# Patient Record
Sex: Female | Born: 1940
Health system: Southern US, Community
[De-identification: ages and names within clinical notes are randomized; demographics above are authoritative.]

## PROBLEM LIST (undated history)

## (undated) DIAGNOSIS — H269 Unspecified cataract: Secondary | ICD-10-CM

## (undated) DIAGNOSIS — K219 Gastro-esophageal reflux disease without esophagitis: Secondary | ICD-10-CM

## (undated) DIAGNOSIS — K5790 Diverticulosis of intestine, part unspecified, without perforation or abscess without bleeding: Secondary | ICD-10-CM

## (undated) DIAGNOSIS — N189 Chronic kidney disease, unspecified: Secondary | ICD-10-CM

## (undated) DIAGNOSIS — T7840XA Allergy, unspecified, initial encounter: Secondary | ICD-10-CM

## (undated) DIAGNOSIS — E785 Hyperlipidemia, unspecified: Secondary | ICD-10-CM

## (undated) DIAGNOSIS — M1990 Unspecified osteoarthritis, unspecified site: Secondary | ICD-10-CM

## (undated) DIAGNOSIS — I10 Essential (primary) hypertension: Secondary | ICD-10-CM

## (undated) DIAGNOSIS — Z86010 Personal history of colonic polyps: Secondary | ICD-10-CM

## (undated) HISTORY — PX: HAND SURGERY: SHX662

## (undated) HISTORY — PX: COLONOSCOPY: SHX174

## (undated) HISTORY — DX: Gastro-esophageal reflux disease without esophagitis: K21.9

## (undated) HISTORY — DX: Diverticulosis of intestine, part unspecified, without perforation or abscess without bleeding: K57.90

## (undated) HISTORY — DX: Personal history of colonic polyps: Z86.010

## (undated) HISTORY — DX: Hyperlipidemia, unspecified: E78.5

## (undated) HISTORY — DX: Unspecified cataract: H26.9

## (undated) HISTORY — DX: Allergy, unspecified, initial encounter: T78.40XA

## (undated) HISTORY — DX: Unspecified osteoarthritis, unspecified site: M19.90

---

## 1973-09-16 HISTORY — PX: APPENDECTOMY: SHX54

## 1973-09-16 HISTORY — PX: ABDOMINAL HYSTERECTOMY: SHX81

## 1998-09-16 HISTORY — PX: THYROID SURGERY: SHX805

## 2004-09-22 ENCOUNTER — Emergency Department (HOSPITAL_COMMUNITY): Admission: EM | Admit: 2004-09-22 | Discharge: 2004-09-22 | Payer: Self-pay | Admitting: Emergency Medicine

## 2004-12-21 ENCOUNTER — Encounter: Admission: RE | Admit: 2004-12-21 | Discharge: 2004-12-21 | Payer: Self-pay | Admitting: Obstetrics & Gynecology

## 2005-02-13 ENCOUNTER — Other Ambulatory Visit: Admission: RE | Admit: 2005-02-13 | Discharge: 2005-02-13 | Payer: Self-pay | Admitting: Interventional Radiology

## 2005-02-13 ENCOUNTER — Encounter: Admission: RE | Admit: 2005-02-13 | Discharge: 2005-02-13 | Payer: Self-pay | Admitting: General Surgery

## 2005-02-13 ENCOUNTER — Encounter (INDEPENDENT_AMBULATORY_CARE_PROVIDER_SITE_OTHER): Payer: Self-pay | Admitting: *Deleted

## 2005-04-04 ENCOUNTER — Encounter (INDEPENDENT_AMBULATORY_CARE_PROVIDER_SITE_OTHER): Payer: Self-pay | Admitting: *Deleted

## 2005-04-04 ENCOUNTER — Ambulatory Visit (HOSPITAL_COMMUNITY): Admission: RE | Admit: 2005-04-04 | Discharge: 2005-04-05 | Payer: Self-pay | Admitting: General Surgery

## 2005-12-31 ENCOUNTER — Inpatient Hospital Stay (HOSPITAL_COMMUNITY): Admission: EM | Admit: 2005-12-31 | Discharge: 2006-01-04 | Payer: Self-pay | Admitting: Emergency Medicine

## 2006-02-20 ENCOUNTER — Encounter: Admission: RE | Admit: 2006-02-20 | Discharge: 2006-02-20 | Payer: Self-pay | Admitting: Family Medicine

## 2006-02-24 ENCOUNTER — Ambulatory Visit: Payer: Self-pay | Admitting: Family Medicine

## 2008-05-18 ENCOUNTER — Inpatient Hospital Stay (HOSPITAL_COMMUNITY): Admission: EM | Admit: 2008-05-18 | Discharge: 2008-05-20 | Payer: Self-pay | Admitting: Emergency Medicine

## 2008-05-24 ENCOUNTER — Ambulatory Visit: Payer: Self-pay | Admitting: Family Medicine

## 2008-07-13 ENCOUNTER — Ambulatory Visit: Payer: Self-pay | Admitting: Family Medicine

## 2008-07-20 ENCOUNTER — Encounter: Admission: RE | Admit: 2008-07-20 | Discharge: 2008-07-20 | Payer: Self-pay | Admitting: Family Medicine

## 2008-07-26 ENCOUNTER — Ambulatory Visit: Payer: Self-pay | Admitting: Internal Medicine

## 2008-08-01 ENCOUNTER — Encounter: Admission: RE | Admit: 2008-08-01 | Discharge: 2008-08-01 | Payer: Self-pay | Admitting: Family Medicine

## 2008-08-09 ENCOUNTER — Encounter: Payer: Self-pay | Admitting: Internal Medicine

## 2008-08-09 ENCOUNTER — Ambulatory Visit: Payer: Self-pay | Admitting: Internal Medicine

## 2008-08-09 DIAGNOSIS — Z860100 Personal history of colon polyps, unspecified: Secondary | ICD-10-CM

## 2008-08-09 HISTORY — DX: Personal history of colon polyps, unspecified: Z86.0100

## 2008-08-09 HISTORY — DX: Personal history of colonic polyps: Z86.010

## 2008-08-16 ENCOUNTER — Encounter: Payer: Self-pay | Admitting: Internal Medicine

## 2009-02-28 ENCOUNTER — Encounter: Admission: RE | Admit: 2009-02-28 | Discharge: 2009-02-28 | Payer: Self-pay | Admitting: Family Medicine

## 2009-06-22 ENCOUNTER — Ambulatory Visit: Payer: Self-pay | Admitting: Family Medicine

## 2009-07-17 ENCOUNTER — Ambulatory Visit: Payer: Self-pay | Admitting: Family Medicine

## 2009-07-25 ENCOUNTER — Encounter: Admission: RE | Admit: 2009-07-25 | Discharge: 2009-07-25 | Payer: Self-pay | Admitting: Family Medicine

## 2009-07-25 LAB — HM DEXA SCAN

## 2009-07-31 ENCOUNTER — Encounter: Admission: RE | Admit: 2009-07-31 | Discharge: 2009-07-31 | Payer: Self-pay | Admitting: Family Medicine

## 2009-09-19 ENCOUNTER — Ambulatory Visit: Payer: Self-pay | Admitting: Family Medicine

## 2010-07-24 ENCOUNTER — Ambulatory Visit: Payer: Self-pay | Admitting: Family Medicine

## 2010-07-30 ENCOUNTER — Emergency Department (HOSPITAL_COMMUNITY): Admission: EM | Admit: 2010-07-30 | Discharge: 2010-07-30 | Payer: Self-pay | Admitting: Emergency Medicine

## 2010-09-18 ENCOUNTER — Encounter
Admission: RE | Admit: 2010-09-18 | Discharge: 2010-09-18 | Payer: Self-pay | Source: Home / Self Care | Attending: Family Medicine | Admitting: Family Medicine

## 2010-09-24 ENCOUNTER — Ambulatory Visit
Admission: RE | Admit: 2010-09-24 | Discharge: 2010-09-24 | Payer: Self-pay | Source: Home / Self Care | Attending: Family Medicine | Admitting: Family Medicine

## 2010-10-02 ENCOUNTER — Ambulatory Visit
Admission: RE | Admit: 2010-10-02 | Discharge: 2010-10-02 | Payer: Self-pay | Source: Home / Self Care | Attending: Family Medicine | Admitting: Family Medicine

## 2010-10-02 ENCOUNTER — Encounter
Admission: RE | Admit: 2010-10-02 | Discharge: 2010-10-02 | Payer: Self-pay | Source: Home / Self Care | Attending: Family Medicine | Admitting: Family Medicine

## 2010-10-07 ENCOUNTER — Encounter: Payer: Self-pay | Admitting: Obstetrics & Gynecology

## 2010-10-22 ENCOUNTER — Ambulatory Visit (INDEPENDENT_AMBULATORY_CARE_PROVIDER_SITE_OTHER): Payer: MEDICARE | Admitting: Physician Assistant

## 2010-10-22 DIAGNOSIS — R1031 Right lower quadrant pain: Secondary | ICD-10-CM

## 2010-10-30 ENCOUNTER — Encounter (INDEPENDENT_AMBULATORY_CARE_PROVIDER_SITE_OTHER): Payer: MEDICARE | Admitting: Family Medicine

## 2010-10-30 DIAGNOSIS — K219 Gastro-esophageal reflux disease without esophagitis: Secondary | ICD-10-CM

## 2010-10-30 DIAGNOSIS — Z0000 Encounter for general adult medical examination without abnormal findings: Secondary | ICD-10-CM

## 2010-10-30 DIAGNOSIS — Z79899 Other long term (current) drug therapy: Secondary | ICD-10-CM

## 2011-01-29 NOTE — Discharge Summary (Signed)
NAMEJANESHIA, Cristina Sawyer              ACCOUNT NO.:  [id number]   MEDICAL RECORD NO.:  [id number]          PATIENT TYPE:  INP   LOCATION:  6742                         FACILITY:  [facility]   PHYSICIAN:  Lonia Blood, M.D.       DATE OF BIRTH:  1940/11/28   DATE OF ADMISSION:  05/18/2008  DATE OF DISCHARGE:  05/20/2008                               DISCHARGE SUMMARY   PRIMARY CARE PHYSICIAN:  This patient sees Dr. Sharlot Gowda, MD   DISCHARGE DIAGNOSES:  1. Acute onset of vertigo - most likely vestibular neuritis.  2. Hyperlipidemia.  3. Obesity.  4. Diverticulosis.   DISCHARGE MEDICATIONS:  1. Aspirin 81 mg daily.  2. Meclizine 25 mg three times a day.  3. Multivitamin daily.   CONDITION ON DISCHARGE:  [name] is discharged in improved  condition, but still with significant vertigo.  She is instructed not to  drive.  She is instructed to have supervision at home and use a walker.  She is set up with home health physical therapy for vestibular  rehabilitation.  The patient is set up to follow up with Dr. Sharlot Gowda on May 24, 2008.   CONSULTATION:  During this admission, no consultations obtained.   PROCEDURE:  1. During this admission on May 19, 2008, the patient underwent      an MRA of the head, which was within normal limits.  2. May 19, 2008, the patient underwent MRI of the brain without      evidence of acute ischemia, question ischemic gliosis related to      small vessel disease, mild-to-moderate thickening of ethmoid air      cells.   HISTORY AND PHYSICAL:  Refer to dictated H and P done by Dr. Ardyth Harps  on May 18, 2008.   HOSPITAL COURSE:  1. Acute vertigo.  [name] was admitted via the emergency room      with acute onset of vertigo.  The patient ruled out for cerebellar      stroke and given her clinical course, physical findings, presence      of hearing loss, together with the nystagmus and vertigo, we do      suspect now that the  patient has vestibular neuritis.  [name]      was offered steroid treatment, but she says that she used steroids      in the past and made her manic.  For this reason, we have decided      to discharge the patient on meclizine alone and set her up with      home health vestibular rehabilitation.  2. Hyperlipidemia.  [name] measured fasting lipid panel      indicated a total cholesterol of 186 and an LDL of 113.  The      patient was informed about these findings and was instructed to      follow a low cholesterol diet and have a recheck in 3 months with      her primary care physician.      Lonia Blood, M.D.  Electronically Signed  SL/MEDQ  D:  05/20/2008  T:  05/21/2008  Job:  [id number]   cc:   Sharlot Gowda, M.D.

## 2011-01-29 NOTE — H&P (Signed)
NAMEMarland Sawyer  JAELEY, WIKER              ACCOUNT NO.:  [id number]   MEDICAL RECORD NO.:  [id number]          PATIENT TYPE:  INP   LOCATION:  6742                         FACILITY:  [facility]   PHYSICIAN:  Peggye Pitt, M.D. DATE OF BIRTH:  1940/11/25   DATE OF ADMISSION:  05/18/2008  DATE OF DISCHARGE:                              HISTORY & PHYSICAL   The patient does not have a primary care physician and is unassigned to  [place].   CHIEF COMPLAINT:  Vertigo and diarrhea.   HISTORY OF PRESENT ILLNESS:  Ms. Cristina Sawyer is a pleasant 70 year old  African American woman with no significant past medical history who  presents with vertigo x2 days.  She became acutely worse today.  She  feels like the room is spinning around her.  Dizziness is worse  initially when she stands up, also complains of blurry vision and severe  imbalance, and she is not able to ambulate without stumbling.  She has  had a severe headache for the past 2 days as well.  Of note, she has  also had diarrhea for 2 days of about 5 episodes a day, small amounts of  diarrhea, light brown in color.   ALLERGIES:  CODEINE, which causes upset of stomach and dizziness.   PAST MEDICAL HISTORY:  None.   MEDICATIONS:  None.   SOCIAL HISTORY:  She denies any tobacco use.  She does state that she is  an alcohol drinker, but she does say that over the weekend, she went to  visit some friends and had an episode of binge drinking.  I suspect she  might drink more than she admits to.  She does deny any illicit  substances.   FAMILY HISTORY:  Significant for both parents and multiple siblings with  hypertension and type 2 diabetes.   10-system review of systems is negative except as per HPI.   PHYSICAL EXAMINATION:  VITAL SIGNS:  Upon admission, blood pressure  141/72, heart rate 66, respirations 27, and O2 sats 99% on room air with  a temperature of 98.7.  GENERAL:  She is alert, awake, and oriented x3.  She has severe  dizziness and  has her eyes closed.  As soon as she opens her eyes, she  feels very dizzy.  HEENT:  She is normocephalic, atraumatic.  Her pupils are equally  reactive to light and accommodation.  Her extraocular movements are  intact.  She does not have any nystagmus.  Her ears, she has a very good  tympanic light reflex.  She does not have any cerumen impaction.  NECK:  Supple with no JVD, no lymphadenopathy, no bruits or goiter.  LUNGS:  Clear bilaterally.  CARDIOVASCULAR:  Regular rate and rhythm with no murmurs, rubs, or  gallops auscultated.  ABDOMEN:  Obese, soft, nontender, and nondistended with positive bowel  sounds.  EXTREMITIES:  She has no edema with positive pedal pulses.  NEUROLOGIC:  Her mental status is intact.  Her cranial nerves II-XII are  intact.  Her DTRs are 2+ and symmetric.  Her muscular strength is 5/5  bilateral and symmetric.  She has  a clumsy finger-to-nose when using her  right hand, but she has normal heel-to-shin on both sides.  I did stand  her up, and she has a positive Romberg test.  She fell to her right side  as well when ambulating.  She tends to fall towards her right side as  well.   LABORATORY UPON ADMISSION:  Sodium 141, potassium 4.1, chloride 108,  bicarb 26, BUN 9, creatinine 0.9, and glucose of 127.  WBC 12.9,  hemoglobin 14.3, and platelets of 218,000.  A UA that was negative.  Point of care markers first set with a myoglobin of 55.6, CK less than  1.0, and CK-MB less than 0.05; second set with a myoglobin of 97.4, CK  1.5, and a CK-MB of less than 0.05.  She also had a CT of her head,  which was negative.   ASSESSMENT AND PLAN:  Acute vertigo:  This does not seem to be benign  paroxysmal positional vertigo given her history.  We will first make  sure she is not orthostatic given her diarrhea by taking orthostatic  vital signs.  She does not have nystagmus present, and she has clear ear  canals.  With the history of a headache, I wonder if this is a   migrainous vertigo.  I doubt this is Meniere disease given this is her  first episode, and she has not had any recurrence of these episodes.  With her acute onset, I am quite worried that she might have a  cerebellar infarct.  She does not have any past medical history that  will make me suspect she is a vasculopath, like hypertension or  diabetes, but again she does not have regular medical care either.  So  at this point, we will check an MRI and MRA of the brain to include a  vertebrobasilar circulation, just to make sure she has not had an acute  infarct in that area.  We will also check an SLP and a hemoglobin A1c  just to further risk stratify her.  We will also check a UDS and a TSH.  We will cycle her EKGs and cardiac enzymes, and I will start her on a  baby aspirin daily.  For DVT prophylaxis, we will place on heparin  subcutaneously and for GI prophylaxis, we will place on Protonix.      Peggye Pitt, M.D.  Electronically Signed     EH/MEDQ  D:  05/18/2008  T:  05/19/2008  Job:  [id number]

## 2011-02-01 NOTE — Op Note (Signed)
Cristina Sawyer, Cristina Sawyer              ACCOUNT NO.:  [id number]   MEDICAL RECORD NO.:  [id number]          PATIENT TYPE:  OIB   LOCATION:  2899                         FACILITY:  [facility]   PHYSICIAN:  Leonie Man, M.D.   DATE OF BIRTH:  [birth year]   DATE OF PROCEDURE:  04/04/2005  DATE OF DISCHARGE:                                 OPERATIVE REPORT   PREOPERATIVE DIAGNOSIS:  Left thyroid mass.   POSTOPERATIVE DIAGNOSIS:  Left thyroid mass, pathology pending.   OPERATION PERFORMED:  Left thyroid lobectomy, isthmectomy.   SURGEON:  Leonie Man, M.D.   ASSISTANT:  Rose Phi. Maple Hudson, M.D.   ANESTHESIA:  General.   INDICATIONS FOR PROCEDURE:  This patient is a 70 year old female who  presents with a newly discovered left thyroid nodule which on ultrasound is  solid with a question of whether this is carcinoma.  She had fine needle  aspiration which showed findings consistent with a hyperplastic nodule.  There are scattered follicular cells and grooves which are present.  Cytologically, there is a papillary focus however, not identified on her  fine needle aspiration.  The patient comes to the operating room now after  the risks and potential benefits of surgery have been fully discussed, all  questions answered and consent obtained.   DESCRIPTION OF PROCEDURE:  Following the induction of satisfactory general  anesthesia with the patient positioned supinely, head and neck  hyperextended, the anterior neck and breasts were prepped and draped to be  included in the sterile operative field.  Prior to starting her thyroid  operation, Dr. Francina Ames performed a duct excision of the left breast.  This will be dictated in a separate operative note.  Following duct  excision, the instruments were changed and a collar incision was made  approximately two fingerbreadths above the sternal notch deepening this  through the skin and subcutaneous tissues and through the platysma muscle.  Superior  flap raised up to the thyroid cartilage and inferior flap down to  the sternal notch.  The strap muscles were opened in the midline to expose  the thyroid.  Dissection was carried over to the left thyroid lobe.  Dissection carried primarily beginning at the upper pole where the upper  pole where the upper pole vessels were isolated and ligated and clipped. The  thyroid was then rolled medially, being careful to protect the parathyroid  glands and recurrent laryngeal nerve.  Staying close on the thyroid capsule,  dissection was carried down along the thyroid, the thyroidoma and lower  parathyroid glands.  The gland was protected as the thyroid was rolled  forward, protecting the recurrent laryngeal nerve throughout the entire  course of dissection. The thyroid was rolled medially over the trachea,  elevating the isthmus over to the junction of the isthmus and the right  lobe.  The thyroid was then transected between clamps and secured with  suture ligatures of 3-0 Vicryl.  This specimen was then forwarded for  pathologic  diagnosis.  The frozen section was consistent with hyperplastic  nodule.  Final pathology is pending.  All areas of dissection were  then  checked for hemostasis.  The right lobe of the thyroid was palpated and  noted to be normal size and texture without any nodules.  The left neck then  had a Surgicel gauze placed for additional hemostasis.  Sponge and  instrument counts were verified. The midline strap muscles were closed with  running 3-0 Vicryl sutures.  The platysma and subcutaneous  tissues closed with interrupted 3-0 Vicryl sutures.  Skin closed with  running 4-0 Monocryl suture and then reinforced with Steri-Strips.  Sterile  dressings applied.  Anesthetic was reversed and the patient removed from the  operating room to the recovery room in stable condition.  She tolerated the  procedure well.       PB/MEDQ  D:  04/04/2005  T:  04/04/2005  Job:  [id number]

## 2011-02-01 NOTE — Discharge Summary (Signed)
Cristina Sawyer, Cristina Sawyer              ACCOUNT NO.:  [id number]   MEDICAL RECORD NO.:  [id number]          PATIENT TYPE:  INP   LOCATION:  5703                         FACILITY:  [facility]   PHYSICIAN:  Hollice Espy, M.D.DATE OF BIRTH:  [birth year]   DATE OF ADMISSION:  12/31/2005  DATE OF DISCHARGE:                                 DISCHARGE SUMMARY   ANTICIPATED DATE OF DISCHARGE:  January 04, 2006.   PRIMARY CARE PHYSICIAN:  [facility] [place].   CHIEF COMPLAINT:  Abdominal pain, diverticulitis.   DISCHARGE DIAGNOSES:  1.  Diverticulitis.  2.  Mild renal insufficiency secondary to dehydration.  3.  Dehydration.   DISCHARGE MEDICATIONS:  1.  Cipro 500 mg one p.o. b.i.d. x4 days.  2.  Flagyl 500 mg one p.o. t.i.d. x4 days.   DISPOSITION:  Improved.   ACTIVITY:  As tolerated.   DISCHARGE DIET:  Regular diet.   The patient is being discharged to home.   HISTORY OF PRESENT ILLNESS:  The patient is a 70 year old African American  female with a past medical history of previous episodes of diverticulitis  who presented with an episode of left lower quadrant times several days with  worsening abdominal pain, nausea, vomiting.  She finally could not take any  more and came to the emergency room.  A CT scan of the abdomen done showed  evidence of perinephric stranding around the sigmoid colon consistent with  diverticulitis.  The patient was admitted.  She was made NPO, started on IV  fluids and IV Cipro and Flagyl.  Initially her heart rate was 110.  Temperature 100.9 and her creatinine was slightly elevated at 1.1.  Over the  next several days she received IV fluids.  Her white count which was  elevated at 13.7 had come down to a normal limit of 10.9 on January 03, 2006.  The patient was tolerating clear liquids by January 02, 2006 and her diet is  being advanced to a full liquid diet by January 03, 2006 with possible regular  dinner by the evening of January 03, 2006.  The  patient is tolerating this  well and is able to tolerate a regular diet.   The plan will be to discharge the patient on the morning of January 04, 2006,  with four more days of antibiotics.   She will follow up with her PCP at urgent care.      Hollice Espy, M.D.  Electronically Signed     SKK/MEDQ  D:  01/03/2006  T:  01/04/2006  Job:  [id number]

## 2011-02-01 NOTE — H&P (Signed)
NAMEBAYLEI, Cristina Sawyer              ACCOUNT NO.:  [id number]   MEDICAL RECORD NO.:  [id number]          PATIENT TYPE:  INP   LOCATION:  1827                         FACILITY:  [facility]   PHYSICIAN:  Hollice Espy, M.D.DATE OF BIRTH:  Nov 25, 1940   DATE OF ADMISSION:  12/31/2005  DATE OF DISCHARGE:                                HISTORY & PHYSICAL   PRIMARY CARE PHYSICIAN:  Urgent Care.   CHIEF COMPLAINT:  Abdominal pain.   HISTORY OF PRESENT ILLNESS:  The patient is a 70 year old, white female with  past medical history of previous episodes of diverticulitis x1 who for the  past 3 days is having problems with abdominal cramping, nausea and vomiting  and progressively worsening to the point where she is not able to keep  anything down.  She presented to the emergency room today on December 31, 2005,  with complaints of same and was evaluated.  Lab work was done on the patient  and she was found to have early signs of dehydration with total protein of  100, 15 ketones and moderate bilirubin as well as hemoglobin.  She was also  noted to have a white count of 13.7 with an 85% shift.  Other labs of note  and concern were a creatinine of 1.1, total bilirubin 1.9.  She had a very  minimal UTI, but on abdominal CT she was noted to have soft tissue  surrounding the sigmoid colon suspicious for acute inflammation.  Currently,  the patient received pain and nausea medicine and is feeling better.   REVIEW OF SYSTEMS:  She denies any headaches, vision changes, dysphagia,  chest pain, palpitations, shortness of breath, wheeze, cough, current  abdominal pain, hematuria, dysuria, constipation, diarrhea, focal extremity  numbness or weakness.  Review of systems were otherwise negative.   PAST MEDICAL HISTORY:  Diverticulitis.   MEDICATIONS:  None.   ALLERGIES:  CODEINE.   SOCIAL HISTORY:  She denies any tobacco, alcohol or drug use.   FAMILY HISTORY:  Noncontributory.   PHYSICAL EXAMINATION:   VITAL SIGNS:  Temperature 100.9, heart rate 110 now  down to 86, blood pressure 132/72, respirations 22, O2 saturations 97% on  room air.  GENERAL:  The patient is alert and oriented x3 in no apparent distress.  HEENT:  Normocephalic, atraumatic.  Mucous membranes, slightly dry.  She has  no carotid bruits.  HEART:  Regular rate and rhythm.  S1, S2.  LUNGS:  Clear to auscultation bilaterally.  ABDOMEN:  Soft, nondistended.  Mild tenderness with scant bowel sounds in  the lower abdominal quadrants.  EXTREMITIES:  No clubbing, cyanosis or edema.   LABORATORY DATA AND X-RAY FINDINGS:  UA shows moderate bilirubin, moderate  hemoglobin, trace ketones, total protein 100, urobilinogen 4, small  leukocyte esterase.  White count 13.7, H&H 13.4 and 38.9, MCV 85, platelet  count 154, 85% shift.  Sodium 132, potassium 3.7, chloride 99, bicarb 23,  BUN 12, creatinine 1.1, glucose 126.  LFTs are noted for an albumin of 3.2,  total bilirubin 1.9.  UA shows 3-6 wbc's, 3-6 rbc's, few bacteria and mucus.  ASSESSMENT/PLAN:  1.  Diverticulitis.  Will make the patient nothing per mouth, intravenous      Cipro and Flagyl plus medication for pain and nausea as well as proton      pump inhibitor.  2.  Mild renal insufficiency likely secondary to dehydration.  Follow up      labs in the morning.      Hollice Espy, M.D.  Electronically Signed     SKK/MEDQ  D:  12/31/2005  T:  12/31/2005  Job:  [id number]

## 2011-02-01 NOTE — Op Note (Signed)
Cristina Sawyer, Cristina Sawyer              ACCOUNT NO.:  [id number]   MEDICAL RECORD NO.:  [id number]          PATIENT TYPE:  OIB   LOCATION:  2899                         FACILITY:  [facility]   PHYSICIAN:  Rose Phi. Maple Hudson, M.D.   DATE OF BIRTH:  1940-09-17   DATE OF PROCEDURE:  04/04/2005  DATE OF DISCHARGE:                                 OPERATIVE REPORT   PREOPERATIVE DIAGNOSIS:  Ductal ectasia of the left breast.   POSTOPERATIVE DIAGNOSIS:  Ductal ectasia of the left breast.   OPERATION:  Excision of duct system of left breast.   SURGEON:  Rose Phi. Maple Hudson, M.D.   ANESTHESIA:  General.   OPERATIVE PROCEDURE:  The patient placed on the operating table and was also  going to have a thyroidectomy so we exposed both breasts and the neck and  prepped and draped all of this. She was positioned for a thyroidectomy. The  profuse watery discharge was coming from the eight to nine o'clock position  of the left breast, so a curved circumareolar incision was made and a wide  excision of this wedge of duct tissue was carried out. Hemostasis obtained  with cautery. Subcuticular closure with 4-0 Monocryl and Steri-Strips  carried out.   The patient then underwent a thyroidectomy done by Dr. Lurene Shadow and that will  be reported in a separate note.       PRY/MEDQ  D:  04/04/2005  T:  04/04/2005  Job:  [id number]

## 2011-03-04 ENCOUNTER — Emergency Department (HOSPITAL_COMMUNITY)
Admission: EM | Admit: 2011-03-04 | Discharge: 2011-03-04 | Disposition: A | Discharge status: Home / Self Care | Payer: Medicare Other | Attending: Emergency Medicine | Admitting: Emergency Medicine

## 2011-03-04 ENCOUNTER — Emergency Department (HOSPITAL_COMMUNITY): Payer: Medicare Other

## 2011-03-04 DIAGNOSIS — R42 Dizziness and giddiness: Secondary | ICD-10-CM | POA: Insufficient documentation

## 2011-03-04 DIAGNOSIS — M79609 Pain in unspecified limb: Secondary | ICD-10-CM | POA: Insufficient documentation

## 2011-03-04 DIAGNOSIS — R079 Chest pain, unspecified: Secondary | ICD-10-CM | POA: Insufficient documentation

## 2011-03-04 DIAGNOSIS — R110 Nausea: Secondary | ICD-10-CM | POA: Insufficient documentation

## 2011-03-04 LAB — POCT I-STAT, CHEM 8
BUN: 19 mg/dL (ref 6–23)
Calcium, Ion: 1.03 mmol/L — ABNORMAL LOW (ref 1.12–1.32)
Chloride: 109 mEq/L (ref 96–112)
Creatinine, Ser: 1.1 mg/dL (ref 0.50–1.10)
Glucose, Bld: 92 mg/dL (ref 70–99)
HCT: 46 % (ref 36.0–46.0)
Hemoglobin: 15.6 g/dL — ABNORMAL HIGH (ref 12.0–15.0)
Potassium: 5.1 mEq/L (ref 3.5–5.1)
Sodium: 139 mEq/L (ref 135–145)
TCO2: 22 mmol/L (ref 0–100)

## 2011-03-04 LAB — CBC
Hemoglobin: 14.5 g/dL (ref 12.0–15.0)
MCV: 85.8 fL (ref 78.0–100.0)
Platelets: 224 10*3/uL (ref 150–400)
RBC: 5.07 MIL/uL (ref 3.87–5.11)
WBC: 8.7 10*3/uL (ref 4.0–10.5)

## 2011-03-04 LAB — DIFFERENTIAL
Eosinophils Absolute: 0.1 10*3/uL (ref 0.0–0.7)
Lymphocytes Relative: 25 % (ref 12–46)
Lymphs Abs: 2.2 10*3/uL (ref 0.7–4.0)
Neutro Abs: 5.9 10*3/uL (ref 1.7–7.7)
Neutrophils Relative %: 69 % (ref 43–77)

## 2011-03-04 LAB — CK TOTAL AND CKMB (NOT AT ARMC)
Relative Index: 1.5 (ref 0.0–2.5)
Relative Index: 1.6 (ref 0.0–2.5)
Total CK: 179 U/L — ABNORMAL HIGH (ref 7–177)

## 2011-03-04 LAB — URINALYSIS, ROUTINE W REFLEX MICROSCOPIC
Glucose, UA: NEGATIVE mg/dL
Leukocytes, UA: NEGATIVE
Nitrite: NEGATIVE
Specific Gravity, Urine: 1.005 (ref 1.005–1.030)
pH: 6 (ref 5.0–8.0)

## 2011-03-04 LAB — TROPONIN I: Troponin I: <0.3 ng/mL (ref <0.30)

## 2011-03-19 ENCOUNTER — Inpatient Hospital Stay: Payer: Medicare Other | Admitting: Family Medicine

## 2011-03-22 ENCOUNTER — Encounter: Payer: Self-pay | Admitting: Family Medicine

## 2011-03-25 ENCOUNTER — Encounter: Payer: Self-pay | Admitting: Family Medicine

## 2011-03-25 ENCOUNTER — Ambulatory Visit (INDEPENDENT_AMBULATORY_CARE_PROVIDER_SITE_OTHER): Payer: Medicare Other | Admitting: Family Medicine

## 2011-03-25 DIAGNOSIS — R079 Chest pain, unspecified: Secondary | ICD-10-CM

## 2011-03-25 NOTE — H&P (Signed)
Cristina Sawyer is an 70 y.o. female.   Chief Complaint:cssss  HPI: [id number]  Past Medical History  Diagnosis Date  . GERD (gastroesophageal reflux disease)   . Dyslipidemia   . Diverticulosis   . Vitamin D deficiency   . Osteoporosis     OSTEOPENIA  . CREST syndrome     No past surgical history on file.  No family history on file. Social History:  reports that she has never smoked. She has never used smokeless tobacco. Her alcohol and drug histories not on file.  Allergies:  Allergies  Allergen Reactions  . Codeine     REACTION: itch/nausea    Medications Prior to Admission  Medication Sig Dispense Refill  . omeprazole (PRILOSEC OTC) 20 MG tablet Take 20 mg by mouth daily.         No current facility-administered medications on file as of 03/25/2011.    No results found for this or any previous visit (from the past 48 hour(s)). @RISRSLT48 @  ROS  Blood pressure 140/70, pulse 72, weight 178 lb (80.74 kg). Physical Exam   Assessment/Plan  Carollee Herter 03/25/2011, 2:45 PM

## 2011-03-25 NOTE — Progress Notes (Signed)
  Subjective:    Patient ID: Cristina Sawyer, female    DOB: 07-12-1941, 70 y.o.   MRN: [id number]  HPI She is here for a followup visit after a recent emergency room visit for evaluation of chest discomfort. The ER record was reviewed. The labs and EKG were essentially negative. Further history indicates that the pain in the left chest areas intermittent and more related with stressful situations. She does tend to take on other peoples problems which tends to weigh her down. She's had no shortness of breath, diaphoresis or weakness.   Review of Systems     Objective:   Physical Exam Alert and in no distress otherwise not examined       Assessment & Plan:  Chest pain probably stress related. I had a long discussion with her concerning stress and stress management as well as taking on other peoples burdens. I will refer her to [facility] for further consultation concerning this. Followup here if she has further difficulty. Over 30 minutes spent discussing this with her.

## 2011-03-26 ENCOUNTER — Encounter: Payer: Self-pay | Admitting: Family Medicine

## 2011-06-14 ENCOUNTER — Ambulatory Visit (INDEPENDENT_AMBULATORY_CARE_PROVIDER_SITE_OTHER): Payer: Medicare Other | Admitting: Family Medicine

## 2011-06-14 ENCOUNTER — Ambulatory Visit: Payer: Medicare Other | Admitting: Family Medicine

## 2011-06-14 ENCOUNTER — Encounter: Payer: Self-pay | Admitting: Family Medicine

## 2011-06-14 VITALS — BP 114/70 | HR 77 | Temp 98.7°F | Wt 174.0 lb

## 2011-06-14 DIAGNOSIS — J019 Acute sinusitis, unspecified: Secondary | ICD-10-CM

## 2011-06-14 DIAGNOSIS — N390 Urinary tract infection, site not specified: Secondary | ICD-10-CM

## 2011-06-14 LAB — POCT URINALYSIS DIPSTICK
Bilirubin, UA: NEGATIVE
Glucose, UA: NEGATIVE
Leukocytes, UA: POSITIVE
Nitrite, UA: NEGATIVE

## 2011-06-14 MED ORDER — CIPROFLOXACIN HCL 500 MG PO TABS: 500.0000 mg | ORAL_TABLET | Freq: Two times a day (BID) | ORAL | Status: DC

## 2011-06-14 NOTE — Progress Notes (Signed)
  Subjective:    Patient ID: Cristina Sawyer, female    DOB: 12/09/1940, 70 y.o.   MRN: [id number]  HPI About 4 days ago she had some back pain as well as foul-smelling urine and some pelvic pressure with frequency. She did use cranberry juice which did help with some of these symptoms. She also has a 6 day history of headache, sinus pressure, PND fever and chill as well as weakness and myalgias.   Review of Systems     Objective:   Physical Exam alert and in no distress. Tympanic membranes and canals are normal. Throat is clear. Tonsils are normal. Neck is supple without adenopathy or thyromegaly. Cardiac exam shows a regular sinus rhythm without murmurs or gallops. Lungs are clear to auscultation. Nasal mucosa is normal. Tender to palpation over maxillary sinuses. Urine microscopic positive for white cells but few red cells.       Assessment & Plan:   1. UTI (urinary tract infection)  POCT Urinalysis Dipstick  2. Sinusitis acute     I will treat her with Cipro. She is to call if continued difficulty.

## 2011-06-14 NOTE — Patient Instructions (Addendum)
Take all of the antibiotic and if not totally back to normal call me Use Tylenol or Advil for aches and pains

## 2011-06-19 LAB — CARDIAC PANEL(CRET KIN+CKTOT+MB+TROPI)
Relative Index: 1.2
Relative Index: 1.3
Total CK: 107
Troponin I: <0.01

## 2011-06-19 LAB — POCT CARDIAC MARKERS
CKMB, poc: 1.5
Myoglobin, poc: 97.4
Troponin i, poc: <0.05

## 2011-06-19 LAB — POCT I-STAT, CHEM 8
BUN: 9
Chloride: 108
Sodium: 141

## 2011-06-19 LAB — URINALYSIS, ROUTINE W REFLEX MICROSCOPIC
Hgb urine dipstick: NEGATIVE
Protein, ur: NEGATIVE
Urobilinogen, UA: 0.2

## 2011-06-19 LAB — DIFFERENTIAL
Lymphocytes Relative: 23
Monocytes Absolute: 0.5
Monocytes Relative: 4
Neutro Abs: 9.1 — ABNORMAL HIGH

## 2011-06-19 LAB — LIPID PANEL
Cholesterol: 186
HDL: 59
Triglycerides: 69

## 2011-06-19 LAB — RAPID URINE DRUG SCREEN, HOSP PERFORMED
Amphetamines: NOT DETECTED
Barbiturates: NOT DETECTED

## 2011-06-19 LAB — HEMOGLOBIN A1C: Mean Plasma Glucose: 117

## 2011-06-19 LAB — CBC
Hemoglobin: 14.3
RBC: 4.94

## 2011-06-19 LAB — CK TOTAL AND CKMB (NOT AT ARMC)
CK, MB: 1.9
Total CK: 136

## 2011-06-19 LAB — ETHANOL: Alcohol, Ethyl (B): <5

## 2011-06-20 ENCOUNTER — Ambulatory Visit (INDEPENDENT_AMBULATORY_CARE_PROVIDER_SITE_OTHER): Payer: Medicare Other | Admitting: Medical

## 2011-06-20 ENCOUNTER — Encounter: Payer: Self-pay | Admitting: Medical

## 2011-06-20 ENCOUNTER — Other Ambulatory Visit: Payer: Self-pay

## 2011-06-20 VITALS — BP 140/80 | HR 62 | Temp 98.4°F | Resp 16

## 2011-06-20 DIAGNOSIS — R5381 Other malaise: Secondary | ICD-10-CM

## 2011-06-20 DIAGNOSIS — T50905A Adverse effect of unspecified drugs, medicaments and biological substances, initial encounter: Secondary | ICD-10-CM

## 2011-06-20 DIAGNOSIS — J329 Chronic sinusitis, unspecified: Secondary | ICD-10-CM

## 2011-06-20 DIAGNOSIS — R300 Dysuria: Secondary | ICD-10-CM

## 2011-06-20 DIAGNOSIS — R5383 Other fatigue: Secondary | ICD-10-CM

## 2011-06-20 DIAGNOSIS — R531 Weakness: Secondary | ICD-10-CM

## 2011-06-20 DIAGNOSIS — R0602 Shortness of breath: Secondary | ICD-10-CM

## 2011-06-20 LAB — CBC WITH DIFFERENTIAL/PLATELET
Eosinophils Relative: 1 % (ref 0–5)
Lymphocytes Relative: 27 % (ref 12–46)
Lymphs Abs: 2.8 10*3/uL (ref 0.7–4.0)
MCV: 86 fL (ref 78.0–100.0)
Neutro Abs: 6.7 10*3/uL (ref 1.7–7.7)
Platelets: 240 10*3/uL (ref 150–400)
RBC: 5 MIL/uL (ref 3.87–5.11)
WBC: 10.2 10*3/uL (ref 4.0–10.5)

## 2011-06-20 LAB — POCT URINALYSIS DIPSTICK
Bilirubin, UA: NEGATIVE
Glucose, UA: NEGATIVE
Ketones, UA: NEGATIVE
Leukocytes, UA: NEGATIVE
Nitrite, UA: NEGATIVE

## 2011-06-20 LAB — BASIC METABOLIC PANEL
CO2: 26 mEq/L (ref 19–32)
Chloride: 104 mEq/L (ref 96–112)
Creat: 0.98 mg/dL (ref 0.50–1.10)
Potassium: 4.1 mEq/L (ref 3.5–5.3)
Sodium: 141 mEq/L (ref 135–145)

## 2011-06-20 MED ORDER — AMOXICILLIN-POT CLAVULANATE 875-125 MG PO TABS: 1.0000 | ORAL_TABLET | Freq: Two times a day (BID) | ORAL | Status: DC

## 2011-06-20 MED ORDER — CIPROFLOXACIN HCL 500 MG PO TABS: 500.0000 mg | ORAL_TABLET | Freq: Two times a day (BID) | ORAL | Status: DC

## 2011-06-20 NOTE — Telephone Encounter (Signed)
Cipro was renewed. Apparently she is still having symptoms from her sinuses. I will give her another 10 days and if no improvement, she will need to return

## 2011-06-20 NOTE — Telephone Encounter (Signed)
Pt called said UTI better but sinus not any better can you call something in or does she need an appt

## 2011-06-20 NOTE — Telephone Encounter (Signed)
Called pt to inform her that med was called in

## 2011-06-20 NOTE — Telephone Encounter (Signed)
Let her know that I called the antibiotic and again and she is not totally normal, she will need to be seen again

## 2011-06-20 NOTE — Progress Notes (Signed)
  Subjective:   HPI  Cristina Sawyer is a 70 y.o. female who presents for recheck.  She saw Dr. Susann Givens recently for UTI and Sinusitis, was put on Cipro, but over the last 2 days had blotchy rash on chest and neck, thinks she may be allergic to Cipro.  She feels that the urine symptoms are improving, but worse weakness, shortness of breath, sinus pressure, feeling worse, ear pressure, pain in right neck, some cough.   She denies recent surgery, long travel, hx/o DVT or PE, no hx/o heart or lung disease.  She has had some diarrhea along with taking Cipro.  No other aggravating or relieving factors.  No other c/o.  The following portions of the patient's history were reviewed and updated as appropriate: allergies, current medications, past family history, past medical history, past social history, past surgical history and problem list.  Past Medical History  Diagnosis Date  . GERD (gastroesophageal reflux disease)   . Dyslipidemia   . Diverticulosis   . Vitamin D deficiency   . Osteoporosis     OSTEOPENIA  . CREST syndrome     Allergies  Allergen Reactions  . Codeine     REACTION: itch/nausea    Current Outpatient Prescriptions on File Prior to Visit  Medication Sig Dispense Refill  . omeprazole (PRILOSEC OTC) 20 MG tablet Take 20 mg by mouth daily.        . ciprofloxacin (CIPRO) 500 MG tablet Take 1 tablet (500 mg total) by mouth 2 (two) times daily.  20 tablet  0    Review of Systems Constitutional: +chills, fever subjective, sweats;  denies fever, chills, sweats, unexpected weight change, fatigue ENT: +both ears hurt, sore throat, worse right, mild runny nose Cardiology: denies chest pain, palpitations, edema Respiratory: +shortness of breath, chest congestion, some cough, morning sputum production only;  denies wheezing Gastroenterology: +sick stomach, nausea, loose stools 2-3 times daily since Cipro;  denies vomiting, constipation  Musculoskeletal: +back ache, flu like aches;   Urology: +improving from last week; no burning, hematuria, urinary frequency, urgency Neurology: no headache, weakness, tingling, numbness      Objective:   Physical Exam  General appearance: alert, no distress, WD/WN, black female, somewhat ill appearing Skin: warm, dry HEENT: normocephalic, sclerae anicteric, TMs pearly, +sinus pressure, nares patent, no discharge or erythema, pharynx normal Oral cavity: MMM, no lesions Neck: supple, no lymphadenopathy, no thyromegaly, no masses, +mild anterior tenderness generalized of neck Heart: RRR, normal S1, S2, no murmurs Lungs: CTA bilaterally, no wheezes, rhonchi, or rales Abdomen: +bs, soft, mild generalized tenderness, non distended, no masses, no hepatomegaly, no splenomegaly Pulses: 2+ symmetric, upper and lower extremities, normal cap refill Extremities: no edema, no calve tenderness   Assessment :      Encounter Diagnoses  Name Primary?  . Sinusitis Yes  . Shortness of breath   . Dysuria   . Weakness generalized   . Adverse drug reaction     Plan:     Sinusitis - consider OTC Mucinex DM, rest, hydrate well, nasal saline, and begin Augmentin.  SOB - CXR today with no obvious pneumonia, no pneumothorax, no mass, on acute process.  Dysuria - urinalysis improved from prior, will send for urine culture  Weakness - advised rest, good hydration, labs to further eval  Adverse drug reaction - possible hives from Cipro given her photographs.  Will put Cipro on the allergy list and avoid in the future

## 2011-06-20 NOTE — Patient Instructions (Signed)
Rest, hydrate well with water, begin Augmentin antibiotic, twice daily for 10 days.  Consider OTC Mucinex DM for cough/congestion  Use 1-2 Aleve once to twice daily for the next several days for muscle soreness.  Call/return if worsening or not improving by Monday.  We will call with lab results.

## 2011-06-21 ENCOUNTER — Telehealth: Payer: Self-pay | Admitting: Medical

## 2011-06-21 NOTE — Telephone Encounter (Signed)
Message copied by Ruffin Frederick on Fri Jun 21, 2011  2:16 PM ------      Message from: [place], [place] S      Created: Fri Jun 21, 2011  9:02 AM       Blood counts, lytes, kidneys look fine.  Lets have her take the 10 day course of Augmentin to cover for bronchitis and sinus.  Call if not improving or worse by Monday.

## 2011-06-21 NOTE — Telephone Encounter (Signed)
DONE

## 2011-09-23 ENCOUNTER — Emergency Department (HOSPITAL_COMMUNITY)
Admission: EM | Admit: 2011-09-23 | Discharge: 2011-09-23 | Disposition: A | Discharge status: Home / Self Care | Payer: Medicare Other | Attending: Emergency Medicine | Admitting: Emergency Medicine

## 2011-09-23 ENCOUNTER — Other Ambulatory Visit: Payer: Self-pay

## 2011-09-23 ENCOUNTER — Encounter (HOSPITAL_COMMUNITY): Payer: Self-pay | Admitting: Emergency Medicine

## 2011-09-23 DIAGNOSIS — M62838 Other muscle spasm: Secondary | ICD-10-CM | POA: Insufficient documentation

## 2011-09-23 DIAGNOSIS — M542 Cervicalgia: Secondary | ICD-10-CM | POA: Insufficient documentation

## 2011-09-23 DIAGNOSIS — E785 Hyperlipidemia, unspecified: Secondary | ICD-10-CM | POA: Insufficient documentation

## 2011-09-23 DIAGNOSIS — K219 Gastro-esophageal reflux disease without esophagitis: Secondary | ICD-10-CM | POA: Insufficient documentation

## 2011-09-23 LAB — CARDIAC PANEL(CRET KIN+CKTOT+MB+TROPI)
CK, MB: 1.9 ng/mL (ref 0.3–4.0)
Relative Index: INVALID (ref 0.0–2.5)
Total CK: 80 U/L (ref 7–177)
Troponin I: <0.3 ng/mL (ref <0.30)

## 2011-09-23 LAB — CBC
MCHC: 33.9 g/dL (ref 30.0–36.0)
MCV: 86.2 fL (ref 78.0–100.0)
Platelets: 228 10*3/uL (ref 150–400)
RDW: 13.9 % (ref 11.5–15.5)
WBC: 10.9 10*3/uL — ABNORMAL HIGH (ref 4.0–10.5)

## 2011-09-23 LAB — BASIC METABOLIC PANEL
BUN: 12 mg/dL (ref 6–23)
Calcium: 9.9 mg/dL (ref 8.4–10.5)
Creatinine, Ser: 1.03 mg/dL (ref 0.50–1.10)
GFR calc Af Amer: 62 mL/min — ABNORMAL LOW (ref >90)

## 2011-09-23 MED ORDER — CYCLOBENZAPRINE HCL 10 MG PO TABS
10.0000 mg | ORAL_TABLET | Freq: Once | ORAL | Status: AC
  Administered 2011-09-23: 10 mg via ORAL
  Filled 2011-09-23: qty 1

## 2011-09-23 MED ORDER — MORPHINE SULFATE 2 MG/ML IJ SOLN
2.0000 mg | Freq: Once | INTRAMUSCULAR | Status: AC
  Administered 2011-09-23: 2 mg via INTRAVENOUS
  Filled 2011-09-23: qty 1

## 2011-09-23 MED ORDER — OXYCODONE-ACETAMINOPHEN 5-325 MG PO TABS: 1.0000 | ORAL_TABLET | ORAL | Status: DC | PRN

## 2011-09-23 MED ORDER — OXYCODONE-ACETAMINOPHEN 5-325 MG PO TABS
1.0000 | ORAL_TABLET | Freq: Once | ORAL | Status: AC
  Administered 2011-09-23: 1 via ORAL
  Filled 2011-09-23: qty 1

## 2011-09-23 MED ORDER — CYCLOBENZAPRINE HCL 10 MG PO TABS: 10.0000 mg | ORAL_TABLET | Freq: Two times a day (BID) | ORAL | Status: AC | PRN

## 2011-09-23 NOTE — ED Provider Notes (Signed)
History   CC: L Lateral Neck Pain  CSN: [id number]  Arrival date & time 09/23/11  4098   First MD Initiated Contact with Patient 09/23/11 0829    Pt p/w 2 days of L sided neck spasm and pain. Pt states she woke with the pain on Saturday morning. No trauma, fever, cough, SOB, CP, weakness, numbness, HA. No sick contacts  No chief complaint on file.   (Consider location/radiation/quality/duration/timing/severity/associated sxs/prior treatment) The history is provided by the patient.    Past Medical History  Diagnosis Date  . GERD (gastroesophageal reflux disease)   . Dyslipidemia   . Diverticulosis   . Vitamin D deficiency   . Osteoporosis     OSTEOPENIA  . CREST syndrome     History reviewed. No pertinent past surgical history.  No family history on file.  History  Substance Use Topics  . Smoking status: Never Smoker   . Smokeless tobacco: Never Used  . Alcohol Use: No    OB History    Grav Para Term Preterm Abortions TAB SAB Ect Mult Living                  Review of Systems  Constitutional: Negative for fever, chills and diaphoresis.  HENT: Positive for neck pain. Negative for congestion, sore throat, facial swelling and rhinorrhea.   Respiratory: Negative for chest tightness, shortness of breath and wheezing.   Cardiovascular: Negative for chest pain and palpitations.  Gastrointestinal: Negative for nausea, vomiting, abdominal pain, diarrhea and abdominal distention.  Musculoskeletal: Negative for back pain.  Skin: Negative for wound.  Neurological: Negative for dizziness, weakness, light-headedness, numbness and headaches.    Allergies  Codeine and Ciprofloxacin hcl  Home Medications   Current Outpatient Rx  Name Route Sig Dispense Refill  . OMEPRAZOLE MAGNESIUM 20 MG PO TBEC Oral Take 20 mg by mouth daily.        BP 133/72  Pulse 60  Temp(Src) 98.2 F (36.8 C) (Oral)  Resp 18  Wt 170 lb (77.111 kg)  SpO2 98%  Physical Exam  Nursing note and  vitals reviewed. Constitutional: She is oriented to person, place, and time. She appears well-developed and well-nourished. No distress.  HENT:  Head: Normocephalic and atraumatic.  Mouth/Throat: Oropharynx is clear and moist.  Eyes: EOM are normal. Pupils are equal, round, and reactive to light.  Neck:       Pt has ttp over L trapezius worsened by ROM but no stiffness. No cervical midline tenderness to palpation  Cardiovascular: Normal rate and regular rhythm.   Pulmonary/Chest: Effort normal and breath sounds normal. No respiratory distress. She has no wheezes. She has no rales.  Abdominal: Soft. Bowel sounds are normal.  Musculoskeletal: Normal range of motion. She exhibits no edema and no tenderness.  Neurological: She is alert and oriented to person, place, and time.       5/5 strength in all ext, sensation intact  Skin: Skin is warm and dry. No rash noted. No erythema.  Psychiatric: She has a normal mood and affect. Her behavior is normal.    ED Course  Procedures (including critical care time)  Date: 09/23/2011  Rate: 62  Rhythm: normal sinus rhythm  QRS Axis: normal  Intervals: normal  ST/T Wave abnormalities: normal and nonspecific ST changes  Conduction Disutrbances:none  Narrative Interpretation:   Old EKG Reviewed: No signigficant changes   Labs Reviewed  BASIC METABOLIC PANEL - Abnormal; Notable for the following:    GFR calc non Af  Amer 54 (*)    GFR calc Af Amer 62 (*)    All other components within normal limits  CBC - Abnormal; Notable for the following:    WBC 10.9 (*)    RBC 5.20 (*)    Hemoglobin 15.2 (*)    All other components within normal limits  CARDIAC PANEL(CRET KIN+CKTOT+MB+TROPI)   No results found.   Diagnosis:Muscular spasm/strain   MDM  Source of pt symptoms likely musculoskeletal. Will check EKG, cardiac enzymes for unlikely anginal equivalent  Pt states pain is improved with symptomatic treatment. Want to go home and f/u with  PMD    Pt given specific instructions to return for worsening symptoms or any new symptoms. She is much more comfortable and requesting d/c  Loren Racer, MD 09/24/11 [phone]

## 2011-09-23 NOTE — ED Notes (Signed)
BJY:[id number]<FA> Expected date:<BR> Expected time:<BR> Means of arrival:Ambulance<BR> Comments:<BR> Neck pain- Hx of meningitis

## 2011-09-27 ENCOUNTER — Encounter: Payer: Self-pay | Admitting: Medical

## 2011-09-27 ENCOUNTER — Ambulatory Visit (INDEPENDENT_AMBULATORY_CARE_PROVIDER_SITE_OTHER): Payer: Medicare Other | Admitting: Medical

## 2011-09-27 VITALS — BP 112/80 | HR 68 | Temp 98.2°F | Resp 16 | Wt 179.0 lb

## 2011-09-27 DIAGNOSIS — R51 Headache: Secondary | ICD-10-CM

## 2011-09-27 DIAGNOSIS — M542 Cervicalgia: Secondary | ICD-10-CM

## 2011-09-27 DIAGNOSIS — M62838 Other muscle spasm: Secondary | ICD-10-CM

## 2011-09-27 DIAGNOSIS — R519 Headache, unspecified: Secondary | ICD-10-CM

## 2011-09-27 DIAGNOSIS — K029 Dental caries, unspecified: Secondary | ICD-10-CM

## 2011-09-27 MED ORDER — HYDROCODONE-ACETAMINOPHEN 7.5-325 MG PO TABS: 1.0000 | ORAL_TABLET | Freq: Four times a day (QID) | ORAL | Status: AC | PRN

## 2011-09-27 NOTE — Progress Notes (Signed)
Subjective: Here in f/u.  She was taken to [facility] emergency dept 5 days ago for severe left neck and head pain.  Had labs, some other eval and was diagnosed with muscle spasm, sent home with muscle relaxer which she is using BID and Percocet 5/325 which is helping the pain, but she ran out of pain medication yesterday.  She notes that she think she slept wrong on her neck on Monday, but the medications are helping.  She wanted to come by for pain medication refill before the weekend.  She still notes left neck and face pain from scalp to shoulder.  No rash.  Sometimes left ear and teeth hurt.  She notes mild sore throat, mild sinus pressure, using Neti Pot,and has longstanding numbness and tingling in bilat arms.  She denies any recent or prior neck imaging.  No other aggravating or relieving factors.  No other c/o.   Past Medical History  Diagnosis Date  . GERD (gastroesophageal reflux disease)   . Dyslipidemia   . Diverticulosis   . Vitamin D deficiency   . Osteoporosis     OSTEOPENIA  . CREST syndrome    ROS: Gen: no fever, chills, sweats Skin: no rash, no hx/o shingles Hear: no CP Lungs: no SOB, wheezing GI: no abdominal pain, NVD Neuro: no headache, weakness, slurred speech, confusion MSK: pain with neck ROM, somewhat stiff and sore     Objective:   Physical Exam  Filed Vitals:   09/27/11 1117  BP: 112/80  Pulse: 68  Temp: 98.2 F (36.8 C)  Resp: 16    General appearance: alert, no distress, WD/WN,  HEENT: normocephalic, sclerae anicteric, TMs pearly, nares patent, no discharge or erythema, pharynx normal Oral cavity: MMM, no lesions Neck: supple, no lymphadenopathy, no thyromegaly, no masses Heart: RRR, normal S1, S2, no murmurs Lungs: CTA bilaterally, no wheezes, rhonchi, or rales Abdomen: +bs, soft, non tender, non distended, no masses, no hepatomegaly, no splenomegaly Pulses: 2+ symmetric, upper and lower extremities, normal cap refill   Assessment and Plan  :    Encounter Diagnoses  Name Primary?  . Muscle spasms of neck Yes  . Cervicalgia   . Facial pain   . Dental caries    Muscle spasm - c/t muscle relaxer, rest, alternate ice/heat, gentle ROM  Cervicalgia/facial pain - refilled Lortab instead of Percocet, advised that if spasm, this should resolve soon.  If not improving, consider returning for other eval that could include imaging of the neck or other.  Can't rule out TMJ or trigeminal neuralgia either.    Dental caries - f/u soon with dentist for left upper molar in decay  RTC in 7-10 days.

## 2011-09-28 ENCOUNTER — Encounter: Payer: Self-pay | Admitting: Medical

## 2011-10-25 ENCOUNTER — Other Ambulatory Visit: Payer: Self-pay | Admitting: Family Medicine

## 2011-12-11 ENCOUNTER — Ambulatory Visit (INDEPENDENT_AMBULATORY_CARE_PROVIDER_SITE_OTHER): Payer: Medicare Other | Admitting: Family Medicine

## 2011-12-11 ENCOUNTER — Encounter: Payer: Self-pay | Admitting: Family Medicine

## 2011-12-11 VITALS — BP 110/70 | Temp 98.8°F | Wt 180.0 lb

## 2011-12-11 DIAGNOSIS — N390 Urinary tract infection, site not specified: Secondary | ICD-10-CM

## 2011-12-11 DIAGNOSIS — R300 Dysuria: Secondary | ICD-10-CM

## 2011-12-11 LAB — POCT URINALYSIS DIPSTICK
Blood, UA: POSITIVE
Glucose, UA: NEGATIVE
Nitrite, UA: NEGATIVE
Protein, UA: POSITIVE
Spec Grav, UA: 1.01
Urobilinogen, UA: NEGATIVE

## 2011-12-11 MED ORDER — SULFAMETHOXAZOLE-TRIMETHOPRIM 800-160 MG PO TABS: 1.0000 | ORAL_TABLET | Freq: Two times a day (BID) | ORAL | Status: AC

## 2011-12-11 NOTE — Patient Instructions (Signed)
Drink plenty of fluids and take all the antibiotic.

## 2011-12-11 NOTE — Progress Notes (Signed)
  Subjective:    Patient ID: Cristina Sawyer, female    DOB: 04/22/1941, 71 y.o.   MRN: [id number]  HPI Several days ago she did have difficulty fever, chills, cough and congestion. This did clear up. She then had difficulty with lower abdominal pain and some dysuria.   Review of Systems     Objective:   Physical Exam alert and in no distress. Tympanic membranes and canals are normal. Throat is clear. Tonsils are normal. Neck is supple without adenopathy or thyromegaly. Cardiac exam shows a regular sinus rhythm without murmurs or gallops. Lungs are clear to auscultation. Microscopic was positive for white cells and bacteria     Assessment & Plan:   1. Dysuria  POCT Urinalysis Dipstick  2. UTI (lower urinary tract infection)     I will treat her with Septra. She is to call if further difficulty.

## 2012-01-24 ENCOUNTER — Encounter: Payer: Self-pay | Admitting: Family Medicine

## 2012-01-24 ENCOUNTER — Other Ambulatory Visit: Payer: Self-pay

## 2012-01-24 ENCOUNTER — Ambulatory Visit (INDEPENDENT_AMBULATORY_CARE_PROVIDER_SITE_OTHER): Payer: Medicare Other | Admitting: Family Medicine

## 2012-01-24 VITALS — BP 112/70 | HR 77 | Wt 183.0 lb

## 2012-01-24 DIAGNOSIS — R350 Frequency of micturition: Secondary | ICD-10-CM

## 2012-01-24 DIAGNOSIS — N390 Urinary tract infection, site not specified: Secondary | ICD-10-CM

## 2012-01-24 LAB — POCT URINALYSIS DIPSTICK
Ketones, UA: NEGATIVE
Protein, UA: NEGATIVE
Spec Grav, UA: <=1.005
Urobilinogen, UA: NEGATIVE
pH, UA: 5

## 2012-01-24 MED ORDER — SULFAMETHOXAZOLE-TRIMETHOPRIM 800-160 MG PO TABS: 1.0000 | ORAL_TABLET | Freq: Two times a day (BID) | ORAL | Status: AC

## 2012-01-24 MED ORDER — OMEPRAZOLE 40 MG PO CPDR: 40.0000 mg | DELAYED_RELEASE_CAPSULE | Freq: Every day | ORAL | Status: DC

## 2012-01-24 NOTE — Patient Instructions (Signed)
Keeping drinking fluids and use  for the aches and pains. If you're still having difficulty, the give me a call

## 2012-01-24 NOTE — Progress Notes (Signed)
  Subjective:    Patient ID: Cristina Sawyer, female    DOB: 01-Jul-1941, 71 y.o.   MRN: [id number]  HPI She complains of a several day history of difficulty with lower abdominal discomfort, urgency, frequency and a foul odor to her urine. No fever, chills.   Review of Systems     Objective:   Physical Exam Alert and in no distress. Abdominal exam shows no tenderness to palpation. Urinalysis did show epithelial cells but also bacteria and white cells.       Assessment & Plan:   1. Urine frequency  POCT Urinalysis Dipstick  2. UTI (lower urinary tract infection)  sulfamethoxazole-trimethoprim (BACTRIM DS,SEPTRA DS) 800-160 MG per tablet   She will call me on Monday if continued difficulty.

## 2012-02-07 ENCOUNTER — Other Ambulatory Visit: Payer: Self-pay | Admitting: Family Medicine

## 2012-02-07 DIAGNOSIS — Z1231 Encounter for screening mammogram for malignant neoplasm of breast: Secondary | ICD-10-CM

## 2012-02-19 ENCOUNTER — Encounter: Payer: Self-pay | Admitting: Family Medicine

## 2012-02-19 ENCOUNTER — Ambulatory Visit (INDEPENDENT_AMBULATORY_CARE_PROVIDER_SITE_OTHER): Payer: Medicare Other | Admitting: Family Medicine

## 2012-02-19 VITALS — BP 120/70 | HR 76 | Ht 64.0 in | Wt 184.0 lb

## 2012-02-19 DIAGNOSIS — E785 Hyperlipidemia, unspecified: Secondary | ICD-10-CM

## 2012-02-19 DIAGNOSIS — Z0000 Encounter for general adult medical examination without abnormal findings: Secondary | ICD-10-CM

## 2012-02-19 DIAGNOSIS — M949 Disorder of cartilage, unspecified: Secondary | ICD-10-CM

## 2012-02-19 DIAGNOSIS — M349 Systemic sclerosis, unspecified: Secondary | ICD-10-CM

## 2012-02-19 DIAGNOSIS — M341 CR(E)ST syndrome: Secondary | ICD-10-CM

## 2012-02-19 DIAGNOSIS — M8580 Other specified disorders of bone density and structure, unspecified site: Secondary | ICD-10-CM

## 2012-02-19 DIAGNOSIS — K219 Gastro-esophageal reflux disease without esophagitis: Secondary | ICD-10-CM

## 2012-02-19 DIAGNOSIS — M899 Disorder of bone, unspecified: Secondary | ICD-10-CM

## 2012-02-19 LAB — COMPREHENSIVE METABOLIC PANEL
Albumin: 4.3 g/dL (ref 3.5–5.2)
Alkaline Phosphatase: 77 U/L (ref 39–117)
BUN: 17 mg/dL (ref 6–23)
Creat: 0.96 mg/dL (ref 0.50–1.10)
Glucose, Bld: 92 mg/dL (ref 70–99)
Potassium: 4.4 mEq/L (ref 3.5–5.3)

## 2012-02-19 LAB — CBC WITH DIFFERENTIAL/PLATELET
Basophils Absolute: 0 10*3/uL (ref 0.0–0.1)
Basophils Relative: 1 % (ref 0–1)
Eosinophils Absolute: 0.1 10*3/uL (ref 0.0–0.7)
Eosinophils Relative: 2 % (ref 0–5)
HCT: 42.2 % (ref 36.0–46.0)
Hemoglobin: 14.3 g/dL (ref 12.0–15.0)
MCH: 28.1 pg (ref 26.0–34.0)
MCHC: 33.9 g/dL (ref 30.0–36.0)
MCV: 83.1 fL (ref 78.0–100.0)
Monocytes Absolute: 0.4 10*3/uL (ref 0.1–1.0)
Monocytes Relative: 6 % (ref 3–12)
Neutro Abs: 4.5 10*3/uL (ref 1.7–7.7)
RDW: 14.9 % (ref 11.5–15.5)

## 2012-02-19 LAB — LIPID PANEL
HDL: 70 mg/dL (ref >39)
LDL Cholesterol: 135 mg/dL — ABNORMAL HIGH (ref 0–99)
Total CHOL/HDL Ratio: 3.1 Ratio
Triglycerides: 61 mg/dL (ref <150)

## 2012-02-19 LAB — POCT URINALYSIS DIPSTICK
Bilirubin, UA: NEGATIVE
Ketones, UA: NEGATIVE
Spec Grav, UA: 1.015
pH, UA: 5

## 2012-02-19 NOTE — Progress Notes (Signed)
  Subjective:    Patient ID: Cristina Sawyer, female    DOB: [birth year], 71 y.o.   MRN: [id number]  HPI She is here for complete examination. She does have  underlying Crest syndrome and in the past has been seen by Dr. Kellie Simmering. She is not having any symptoms with this and was told that followup would depend on any symptoms. She does have reflux and responds well to Prilosec. She does have a history of hyperlipidemia but presently is on no medications. She also has a history of osteopenia.  She does complain of or position. a tingling sensation especially in her right hand but cannt relate this to any particular physical activitiesShe has no other concerns or complaints.   Review of Systems     Objective:   Physical Exam BP 120/70  Pulse 76  Ht 5\' 4"  (1.626 m)  Wt 184 lb (83.462 kg)  BMI 31.58 kg/m2  General Appearance:    Alert, cooperative, no distress, appears stated age  Head:    Normocephalic, without obvious abnormality, atraumatic  Eyes:    PERRL, conjunctiva/corneas clear, EOM's intact, fundi    benign  Ears:    Normal TM's and external ear canals  Nose:   Nares normal, mucosa normal, no drainage or sinus   tenderness  Throat:   Lips, mucosa, and tongue normal; teeth and gums normal  Neck:   Supple, no lymphadenopathy;  thyroid:  no   enlargement/tenderness/nodules; no carotid   bruit or JVD  Back:    Spine nontender, no curvature, ROM normal, no CVA     tenderness  Lungs:     Clear to auscultation bilaterally without wheezes, rales or     ronchi; respirations unlabored  Chest Wall:    No tenderness or deformity   Heart:    Regular rate and rhythm, S1 and S2 normal, no murmur, rub   or gallop  Breast Exam:    Deferred to GYN  Abdomen:     Soft, non-tender, nondistended, normoactive bowel sounds,    no masses, no hepatosplenomegaly  Genitalia:    Deferred to GYN     Extremities:   No clubbing, cyanosis or edema. Exam of the right hand shows negative Tinel's and Phalen's  with normal sensation and strength   Pulses:   2+ and symmetric all extremities  Skin:   Skin color, texture, turgor normal, no rashes or lesions  Lymph nodes:   Cervical, supraclavicular, and axillary nodes normal  Neurologic:   CNII-XII intact, normal strength, sensation and gait; reflexes 2+ and symmetric throughout          Psych:   Normal mood, affect, hygiene and grooming.           Assessment & Plan:   1. Routine general medical examination at a health care facility  POCT Urinalysis Dipstick, CBC with Differential, Comprehensive metabolic panel, Lipid panel  2. CREST syndrome  CBC with Differential, Comprehensive metabolic panel  3. GERD (gastroesophageal reflux disease)    4. Hyperlipidemia LDL goal < 100  Lipid panel  5. Osteopenia    6    right hand tingling  encouraged her to continue with her active lifestyle. Discussed keeping track of these symptoms with her right hand and if these worsen, return here. Recommend baby aspirin and multivitamin. Prescription for Zostavax written.

## 2012-02-26 ENCOUNTER — Ambulatory Visit
Admission: RE | Admit: 2012-02-26 | Discharge: 2012-02-26 | Disposition: A | Discharge status: Home / Self Care | Payer: Medicare Other | Source: Ambulatory Visit | Attending: Family Medicine | Admitting: Family Medicine

## 2012-02-26 DIAGNOSIS — Z1231 Encounter for screening mammogram for malignant neoplasm of breast: Secondary | ICD-10-CM

## 2012-06-30 ENCOUNTER — Other Ambulatory Visit: Payer: Self-pay | Admitting: Family Medicine

## 2012-07-06 ENCOUNTER — Encounter: Payer: Self-pay | Admitting: Family Medicine

## 2012-07-06 ENCOUNTER — Ambulatory Visit (INDEPENDENT_AMBULATORY_CARE_PROVIDER_SITE_OTHER): Payer: Medicare Other | Admitting: Family Medicine

## 2012-07-06 VITALS — BP 128/80 | HR 74 | Temp 98.5°F | Wt 185.0 lb

## 2012-07-06 DIAGNOSIS — J019 Acute sinusitis, unspecified: Secondary | ICD-10-CM

## 2012-07-06 MED ORDER — AMOXICILLIN-POT CLAVULANATE 875-125 MG PO TABS: 1.0000 | ORAL_TABLET | Freq: Two times a day (BID) | ORAL | Status: AC

## 2012-07-06 NOTE — Patient Instructions (Signed)
Take all the antibiotic and if not totally back to normal when you're done call me for more

## 2012-07-06 NOTE — Progress Notes (Signed)
  Subjective:    Patient ID: Cristina Sawyer, female    DOB: 02-23-1941, 71 y.o.   MRN: [id number]  HPI She has a two-week history started with sore throat and some postnasal drainage and now she is seeing some blood in the drainage. She also has a hoarse voice, sinus pressure malaise and fatigue. She's also had some myalgias with fever and chills. Also notes mild upper tooth discomfort especially on the left. She does not smoke and has no allergies  Review of Systems     Objective:   Physical Exam alert and in no distress. Tympanic membranes and canals are normal. Throat is clear. Tonsils are normal. Neck is supple without adenopathy or thyromegaly. Cardiac exam shows a regular sinus rhythm without murmurs or gallops. Lungs are clear to auscultation. His mucosa is slightly red with tenderness over frontal and maxillary sinuses        Assessment & Plan:   1. Acute sinusitis  amoxicillin-clavulanate (AUGMENTIN) 875-125 MG per tablet

## 2012-11-05 ENCOUNTER — Encounter: Payer: Self-pay | Admitting: Family Medicine

## 2012-11-05 ENCOUNTER — Ambulatory Visit (INDEPENDENT_AMBULATORY_CARE_PROVIDER_SITE_OTHER): Payer: Medicare Other | Admitting: Family Medicine

## 2012-11-05 VITALS — BP 120/80 | HR 80 | Temp 98.4°F | Wt 187.0 lb

## 2012-11-05 DIAGNOSIS — H8110 Benign paroxysmal vertigo, unspecified ear: Secondary | ICD-10-CM

## 2012-11-05 DIAGNOSIS — IMO0002 Reserved for concepts with insufficient information to code with codable children: Secondary | ICD-10-CM

## 2012-11-05 DIAGNOSIS — J019 Acute sinusitis, unspecified: Secondary | ICD-10-CM

## 2012-11-05 MED ORDER — AMOXICILLIN 875 MG PO TABS: 875.0000 mg | ORAL_TABLET | Freq: Two times a day (BID) | ORAL | Status: DC

## 2012-11-05 NOTE — Progress Notes (Signed)
  Subjective:    Patient ID: Cristina Sawyer, female    DOB: 1940-11-29, 72 y.o.   MRN: [id number]  HPI She is here for evaluation of 2 issues. She has had 3 episodes that she describes as dizziness that occur with position change. They're associated with nausea but no numbness, tingling, heart rate changes, blurred or double vision. She cannot tell which position in particular caused the dizziness . She also complains of a three-week history of sinus pressure, PND, headache, malaise and fatigue with slight sore throat and eye discomfort earache she also has a productive cough. She does not smoke.   Review of Systems     Objective:   Physical Exam alert and in no distress. Tympanic membranes and canals are normal. Throat is clear. Tonsils are normal. Neck is supple without adenopathy or thyromegaly. Cardiac exam shows a regular sinus rhythm without murmurs or gallops. Lungs are clear to auscultation.nasal mucosa is reddish purple. Tender to palpation over frontal ethmoid and maxillary sinuses.        Assessment & Plan:  Acute sinusitis  Positional vertigo  I will treat her with Amoxil. She is to call if not entirely better. Discussed the dizziness with her. Encouraged her to keep track of when the symptoms get worse especially in regard to position. Return here as needed.

## 2013-01-16 ENCOUNTER — Other Ambulatory Visit: Payer: Self-pay | Admitting: Family Medicine

## 2013-03-15 ENCOUNTER — Encounter: Payer: Self-pay | Admitting: Family Medicine

## 2013-03-15 ENCOUNTER — Ambulatory Visit (INDEPENDENT_AMBULATORY_CARE_PROVIDER_SITE_OTHER): Payer: Medicare Other | Admitting: Family Medicine

## 2013-03-15 VITALS — BP 126/80 | HR 78 | Temp 98.6°F | Wt 187.0 lb

## 2013-03-15 DIAGNOSIS — J019 Acute sinusitis, unspecified: Secondary | ICD-10-CM

## 2013-03-15 DIAGNOSIS — J301 Allergic rhinitis due to pollen: Secondary | ICD-10-CM

## 2013-03-15 MED ORDER — AMOXICILLIN 875 MG PO TABS: 875.0000 mg | ORAL_TABLET | Freq: Two times a day (BID) | ORAL | Status: DC

## 2013-03-15 NOTE — Progress Notes (Signed)
  Subjective:    Patient ID: Cristina Sawyer, female    DOB: 02/18/1941, 72 y.o.   MRN: [id number]  HPI She has a five-day history of rhinorrhea, PND, headache, earache chills, bilateral earache and tender glands. She also complains of left upper tooth discomfort. She subsequently developed a slightly sore throat. She has been using OTC medications and a Netti pot. She has had several sinus infections within the last year. She does have underlying allergies and has been intermittently treating them with an antihistamine. Presently she is not on anything. She does not smoke.   Review of Systems     Objective:   Physical Exam alert and in no distress. Tympanic membranes and canals are normal. Throat is clear. Tonsils are normal. Neck is supple without adenopathy or thyromegaly. Cardiac exam shows a regular sinus rhythm without murmurs or gallops. Lungs are clear to auscultation. Nasal mucosa is red with tenderness over left maxillary sinus        Assessment & Plan:  Acute sinusitis - Plan: amoxicillin (AMOXIL) 875 MG tablet  Allergic rhinitis due to pollen  she will call that entirely better. Recommend she try and antihistamine on a regular basis and see how this works. Also discussed possible use of a nasal steroid.

## 2013-03-15 NOTE — Patient Instructions (Signed)
Take either Claritin or Allegra regularly. You can use the generic products

## 2013-04-02 ENCOUNTER — Ambulatory Visit (INDEPENDENT_AMBULATORY_CARE_PROVIDER_SITE_OTHER): Payer: Medicare Other | Admitting: Family Medicine

## 2013-04-02 ENCOUNTER — Ambulatory Visit
Admission: RE | Admit: 2013-04-02 | Discharge: 2013-04-02 | Disposition: A | Discharge status: Home / Self Care | Payer: Medicare Other | Source: Ambulatory Visit | Attending: Family Medicine | Admitting: Family Medicine

## 2013-04-02 ENCOUNTER — Encounter: Payer: Self-pay | Admitting: Family Medicine

## 2013-04-02 VITALS — BP 122/70 | HR 82 | Temp 98.6°F | Wt 188.0 lb

## 2013-04-02 DIAGNOSIS — R05 Cough: Secondary | ICD-10-CM

## 2013-04-02 DIAGNOSIS — J019 Acute sinusitis, unspecified: Secondary | ICD-10-CM

## 2013-04-02 DIAGNOSIS — J209 Acute bronchitis, unspecified: Secondary | ICD-10-CM

## 2013-04-02 DIAGNOSIS — R059 Cough, unspecified: Secondary | ICD-10-CM

## 2013-04-02 MED ORDER — AMOXICILLIN 875 MG PO TABS: 875.0000 mg | ORAL_TABLET | Freq: Two times a day (BID) | ORAL | Status: DC

## 2013-04-02 NOTE — Patient Instructions (Signed)
Continue with the allergy medication and the antibiotic. If not totally back to normal when you finish call me.

## 2013-04-02 NOTE — Progress Notes (Signed)
  Subjective:    Patient ID: Cristina Sawyer, female    DOB: [birth year], 72 y.o.   MRN: [id number]  HPI She was seen and treated for sinus infection the end of June. She was 80% better but still having difficulty with phlegm in the back of her throat no swollen glands and headaches. Also the last several days she has noted increased chest tightness   Review of Systems     Objective:   Physical Exam alert and in no distress. Tympanic membranes and canals are normal. Throat is clear. Tonsils are normal. Neck is supple without adenopathy or thyromegaly. Cardiac exam shows a regular sinus rhythm without murmurs or gallops. Lungs are clear to auscultation. Nasal mucosa is reddish purple. Tender over frontal and maxillary sinuses. Chest x-ray shows chronic bronchitic changes.       Assessment & Plan:  Acute sinusitis - Plan: amoxicillin (AMOXIL) 875 MG tablet  Acute bronchitis  I will treat her for a longer period of time and again stressed the fact that she should call me if not totally back to normal. Also encouraged her to stay on the antihistamine.

## 2013-04-03 NOTE — Progress Notes (Signed)
  Subjective:    Patient ID: Cristina Sawyer, female    DOB: 04-Apr-1941, 72 y.o.   MRN: [id number]  HPI    Review of Systems     Objective:   Physical Exam        Assessment & Plan:  error

## 2013-08-06 ENCOUNTER — Ambulatory Visit (INDEPENDENT_AMBULATORY_CARE_PROVIDER_SITE_OTHER): Payer: Medicare Other | Admitting: Medical

## 2013-08-06 ENCOUNTER — Encounter: Payer: Self-pay | Admitting: Medical

## 2013-08-06 VITALS — BP 148/88 | HR 76 | Temp 98.1°F | Resp 16 | Wt 187.0 lb

## 2013-08-06 DIAGNOSIS — Z23 Encounter for immunization: Secondary | ICD-10-CM

## 2013-08-06 DIAGNOSIS — N390 Urinary tract infection, site not specified: Secondary | ICD-10-CM

## 2013-08-06 LAB — POCT URINALYSIS DIPSTICK
Bilirubin, UA: NEGATIVE
Glucose, UA: NEGATIVE
Ketones, UA: NEGATIVE
Nitrite, UA: POSITIVE
Spec Grav, UA: <=1.005
pH, UA: 6

## 2013-08-06 MED ORDER — SULFAMETHOXAZOLE-TMP DS 800-160 MG PO TABS: 1.0000 | ORAL_TABLET | Freq: Two times a day (BID) | ORAL | Status: DC

## 2013-08-06 NOTE — Addendum Note (Signed)
Addended by: Janeice Robinson on: 08/06/2013 02:48 PM   Modules accepted: Orders

## 2013-08-06 NOTE — Progress Notes (Signed)
Subjective:  Cristina Sawyer is a 72 y.o. female who complains of possible urinary tract infection.  She has had symptoms for 3 days.  Symptoms include pelvic pressure, odorous urine, frequency. Patient denies back pain, vaginal discharge and fever, abdominal pain, nausea or vomiting.  Last UTI was earlier this year.   Using cranberry juice and hydration for current symptoms.  Patient does have a history of prior UTIs. Patient does not have a history of pyelonephritis.  No other aggravating or relieving factors.  No other c/o.   Past Medical History  Diagnosis Date  . GERD (gastroesophageal reflux disease)   . Dyslipidemia   . Diverticulosis   . Vitamin D deficiency   . Osteoporosis     OSTEOPENIA  . CREST syndrome   . Arthritis     ROS as in subjective  Reviewed allergies, medications, past medical, surgical, and social history.    Objective: Filed Vitals:   08/06/13 1345  BP: 148/88  Pulse: 76  Temp: 98.1 F (36.7 C)  Resp: 16    General appearance: alert, no distress, WD/WN, female Abdomen: +bs, soft, mild suprapubic tenderness, othewrise non tender, non distended, no masses, no hepatomegaly, no splenomegaly, no bruits Back: no CVA tenderness GU: deferred     Assessment: Encounter Diagnoses  Name Primary?  . UTI (urinary tract infection) Yes  . Need for prophylactic vaccination and inoculation against influenza     Plan: Discussed symptoms, diagnosis, possible complications, and usual course of illness.  Begin medication Bactrim per orders below.  Advised increased water intake, can use OTC Tylenol for pain.   Call or return if worse or not improving.    Counseled on the influenza virus vaccine.  Vaccine information sheet given.  Influenza vaccine given after consent obtained.

## 2013-08-09 ENCOUNTER — Encounter: Payer: Self-pay | Admitting: Internal Medicine

## 2013-08-16 ENCOUNTER — Encounter: Payer: Self-pay | Admitting: Internal Medicine

## 2013-09-16 DIAGNOSIS — I10 Essential (primary) hypertension: Secondary | ICD-10-CM

## 2013-09-16 HISTORY — DX: Essential (primary) hypertension: I10

## 2013-12-28 ENCOUNTER — Ambulatory Visit (INDEPENDENT_AMBULATORY_CARE_PROVIDER_SITE_OTHER): Payer: Medicare HMO | Admitting: Medical

## 2013-12-28 ENCOUNTER — Encounter: Payer: Self-pay | Admitting: Medical

## 2013-12-28 VITALS — BP 130/80 | HR 62 | Temp 97.9°F | Resp 16 | Wt 184.0 lb

## 2013-12-28 DIAGNOSIS — R142 Eructation: Secondary | ICD-10-CM

## 2013-12-28 DIAGNOSIS — R300 Dysuria: Secondary | ICD-10-CM

## 2013-12-28 DIAGNOSIS — R141 Gas pain: Secondary | ICD-10-CM

## 2013-12-28 DIAGNOSIS — R140 Abdominal distension (gaseous): Secondary | ICD-10-CM

## 2013-12-28 DIAGNOSIS — K5909 Other constipation: Secondary | ICD-10-CM

## 2013-12-28 DIAGNOSIS — R143 Flatulence: Secondary | ICD-10-CM

## 2013-12-28 DIAGNOSIS — K5900 Constipation, unspecified: Secondary | ICD-10-CM

## 2013-12-28 LAB — POCT URINALYSIS DIPSTICK
Bilirubin, UA: NEGATIVE
GLUCOSE UA: NEGATIVE
Ketones, UA: NEGATIVE
Nitrite, UA: NEGATIVE
SPEC GRAV UA: 1.015
UROBILINOGEN UA: NEGATIVE
pH, UA: 5

## 2013-12-28 MED ORDER — LINACLOTIDE 145 MCG PO CAPS: 145.0000 ug | ORAL_CAPSULE | Freq: Every day | ORAL | Status: DC

## 2013-12-28 NOTE — Addendum Note (Signed)
Addended by: Armanda Magic on: 12/28/2013 01:24 PM   Modules accepted: Orders

## 2013-12-28 NOTE — Progress Notes (Signed)
   Subjective:   Cristina Sawyer is a 73 y.o. female presenting on 12/28/2013 with bloating, gas, constipation, lower back pain and Urinary Frequency  She reports having lots of gas, bloating, pressure, then started having pain in her abdomen.  Used some milk of magnesia. Still feeling bloated and gassy.  Last night felt like she was getting another UTI.  Having pelvic pressure, urinary frequency.  Urine not odorous, but maybe a little cloudy.  No burning with urination.  No blood in urine.  Having trouble with BMs.  Having few pebbles of stool daily but not what she would even call a bowel movement.   No fever, no blood or mucous in the stool.  s planning to get updated colonoscopy soon.  Last colonoscopy 5 years ago, normal per patient.  Thinks it was [place].  Been drinking cinnamon and honey mixture which seemed to help.  No other aggravating or relieving factors.  No other complaint.  Review of Systems ROS as in subjective     Objective:    Filed Vitals:   12/28/13 1202  BP: 130/80  Pulse: 62  Temp: 97.9 F (36.6 C)  Resp: 16    General appearance: alert, no distress, WD/WN Abdomen: +bs, soft,mild generalized tenderness, non distended, no masses, no hepatomegaly, no splenomegaly Back: nontender Ext: no edema    Assessment: Encounter Diagnoses  Name Primary?  . Chronic constipation Yes  . Abdominal bloating   . Dysuria      Plan: Begin trial of Linzess, samples given , increase water and fiber intake.  Dysuria - urine culture sent  Cristina Sawyer was seen today for bloating, gas, constipation, lower back pain and urinary frequency.  Diagnoses and associated orders for this visit:  Chronic constipation  Abdominal bloating  Dysuria - Urine culture  Other Orders - Linaclotide (LINZESS) 145 MCG CAPS capsule; Take 1 capsule (145 mcg total) by mouth daily.    Return 1-2 wk.

## 2013-12-30 LAB — URINE CULTURE
Colony Count: NO GROWTH
Organism ID, Bacteria: NO GROWTH

## 2013-12-31 ENCOUNTER — Other Ambulatory Visit (INDEPENDENT_AMBULATORY_CARE_PROVIDER_SITE_OTHER): Payer: Medicare HMO

## 2013-12-31 DIAGNOSIS — R300 Dysuria: Secondary | ICD-10-CM

## 2013-12-31 DIAGNOSIS — R319 Hematuria, unspecified: Secondary | ICD-10-CM

## 2013-12-31 LAB — POCT URINALYSIS DIPSTICK
Bilirubin, UA: NEGATIVE
Glucose, UA: NEGATIVE
Ketones, UA: NEGATIVE
Leukocytes, UA: NEGATIVE
Nitrite, UA: NEGATIVE
Protein, UA: NEGATIVE
Spec Grav, UA: 1.02
Urobilinogen, UA: NEGATIVE
pH, UA: 5

## 2013-12-31 LAB — POCT UA - MICROSCOPIC ONLY

## 2014-01-03 ENCOUNTER — Encounter: Payer: Self-pay | Admitting: Internal Medicine

## 2014-01-17 ENCOUNTER — Ambulatory Visit (INDEPENDENT_AMBULATORY_CARE_PROVIDER_SITE_OTHER): Payer: Medicare HMO | Admitting: Family Medicine

## 2014-01-17 ENCOUNTER — Encounter: Payer: Self-pay | Admitting: Family Medicine

## 2014-01-17 VITALS — BP 140/88 | HR 76 | Ht 63.5 in | Wt 185.0 lb

## 2014-01-17 DIAGNOSIS — Z860100 Personal history of colon polyps, unspecified: Secondary | ICD-10-CM

## 2014-01-17 DIAGNOSIS — Z79899 Other long term (current) drug therapy: Secondary | ICD-10-CM

## 2014-01-17 DIAGNOSIS — Z0000 Encounter for general adult medical examination without abnormal findings: Secondary | ICD-10-CM

## 2014-01-17 DIAGNOSIS — Z23 Encounter for immunization: Secondary | ICD-10-CM

## 2014-01-17 DIAGNOSIS — E785 Hyperlipidemia, unspecified: Secondary | ICD-10-CM

## 2014-01-17 DIAGNOSIS — K219 Gastro-esophageal reflux disease without esophagitis: Secondary | ICD-10-CM

## 2014-01-17 LAB — POCT URINALYSIS DIPSTICK
Bilirubin, UA: NEGATIVE
Glucose, UA: NEGATIVE
KETONES UA: NEGATIVE
Leukocytes, UA: NEGATIVE
Nitrite, UA: NEGATIVE
Protein, UA: NEGATIVE
Spec Grav, UA: 1.01
Urobilinogen, UA: NEGATIVE
pH, UA: 5

## 2014-01-17 LAB — CBC WITH DIFFERENTIAL/PLATELET
Basophils Absolute: 0.1 10*3/uL (ref 0.0–0.1)
Basophils Relative: 1 % (ref 0–1)
Eosinophils Absolute: 0.2 10*3/uL (ref 0.0–0.7)
Eosinophils Relative: 2 % (ref 0–5)
HCT: 43.2 % (ref 36.0–46.0)
Hemoglobin: 14.7 g/dL (ref 12.0–15.0)
LYMPHS PCT: 30 % (ref 12–46)
Lymphs Abs: 2.4 10*3/uL (ref 0.7–4.0)
MCH: 28.1 pg (ref 26.0–34.0)
MCHC: 34 g/dL (ref 30.0–36.0)
MCV: 82.6 fL (ref 78.0–100.0)
MONO ABS: 0.6 10*3/uL (ref 0.1–1.0)
MONOS PCT: 7 % (ref 3–12)
Neutro Abs: 4.8 10*3/uL (ref 1.7–7.7)
Neutrophils Relative %: 60 % (ref 43–77)
Platelets: 250 10*3/uL (ref 150–400)
RBC: 5.23 MIL/uL — AB (ref 3.87–5.11)
RDW: 14.9 % (ref 11.5–15.5)
WBC: 8 10*3/uL (ref 4.0–10.5)

## 2014-01-17 MED ORDER — ESOMEPRAZOLE MAGNESIUM 40 MG PO CPDR: 40.0000 mg | DELAYED_RELEASE_CAPSULE | Freq: Every day | ORAL | Status: DC

## 2014-01-17 NOTE — Progress Notes (Deleted)
   Subjective:    Patient ID: Cristina Sawyer, female    DOB: 1941/08/09, 73 y.o.   MRN: [id number]  HPI    Review of Systems     Objective:   Physical Exam        Assessment & Plan:

## 2014-01-17 NOTE — Progress Notes (Signed)
Subjective:    Patient ID: Cristina Sawyer, female    DOB: 1940/12/16, 73 y.o.   MRN: [id number]  Cristina Sawyer is a very pleasant 73 y.o. yo female who  has a past medical history of GERD (gastroesophageal reflux disease); Dyslipidemia; Arthritis; and Personal history of colonic adenoma (08/09/2008). She presents today for her annual physical.   HPI  The patient had some stomach pain (bloating, gas and constipation) which were triggered by certain foods a couple weeks ago. This resolved with linzess, activia and a change in her diet. The patient has noticed however that her GERD symptoms are slightly worse, and especially pronounced when she lays down or bends over. Omeprazole is not as effective as nexium for her symptoms and she would like a prescription for this today.   In regards to her arthritis, the patient reports that her aches and pains are controlled with aleve. Otherwise she is not currently taking any medications.   The patient also notes some residual lower back pain from a fall several years aog. The pain resolved completely with help from a chiropractor however, it was recently triggered but the increased abdominal pressure from gas and constipation a couple weeks ago. The patient plans to revisit the chiropractor soon.   The patient reports that her social and family life are going well and that she has no new stressors in her life right now. He has been dating a younger man for quite some time and this is going well  The patient is a never smoker who drinks alcohol only occasional. The patient reports a healthy diet overall and like to garden and walk for exercise.   Patient was diagnosed with CREST syndrome by a rheumatologist many years ago. However, she does not currently see that rheumatologist and has no symptoms of CREST and denies any symptoms consistent with raynaud's, sclerodactyly or esophageal dysmotility.   Health Maintenance: The patient is currently due  for a colonoscopy and mamogram. She has not had her zoster vaccine yet. Patient is also due for a eye exam at this time.   Her pneumococcal vaccination history is unclear, some record indicate she received the vaccine in 2009 when hospitalized, however there are no official records of this.   Review of Systems is negative except per HPI.    Objective:   Physical Exam  Constitutional: Patient is well-developed, well-nourished, and in no distress. HENT: Head is normocephalic and atraumatic.  Mouth/Throat: Oropharynx is clear and moist without erythema or exudates Eyes: Conjunctivae and EOM are normal. Pupils are equal, round, and reactive to light. Slight pigmentation changes noted in the right eye.  Neck: Normal range of motion. Neck supple.  Cardiovascular: Normal rate, regular rhythm. Exam reveals no murmurs, gallops and no friction rub.  Pulmonary/Chest: Effort normal and CTAB. No respiratory distress. No wheezes or ronchi.   Abdominal: Soft, non-tender and non-distended. There is no rebound or guarding. No HSM.  Neurological: Patient is alert and oriented to person, place, and time.  Reflex Scores: Biceps and Patellar reflexes were 2+ and equal bilaterally.  Skin: Skin is warm and dry. No rash noted.  Psychiatric: Affect normal.     Assessment & Plan:  Routine general medical examination at a health care facility - Plan: POCT urinalysis dipstick, CBC with Differential, Comprehensive metabolic panel, Lipid panel  Personal history of colonic adenoma  GERD (gastroesophageal reflux disease) - Plan: CBC with Differential, Comprehensive metabolic panel, esomeprazole (NEXIUM) 40 MG capsule  Hyperlipidemia LDL  goal < 130 - Plan: Lipid panel  Encounter for long-term (current) use of other medications - Plan: CBC with Differential, Comprehensive metabolic panel, Lipid panel  Need for prophylactic vaccination against Streptococcus pneumoniae (pneumococcus) - Plan: Pneumococcal conjugate  vaccine 13-valent   1) Will do screening blood work today.   2) Will remove CREST syndrome from the list of the patient's current problems, as she has been asymptomatic from this for several years and the rationale for the initial diagnosis is unclear.   3) Will administer pneumococcal 13 today and prescribe the zoster vaccine as well.   4) Patient will call to schedule her mammogram, colonoscopy and eye exams. She will also schedule her next chiropractic exam for follow up of her back pain.  Zostavax prescription also written

## 2014-01-18 LAB — COMPREHENSIVE METABOLIC PANEL
ALT: 13 U/L (ref 0–35)
AST: 15 U/L (ref 0–37)
Albumin: 4.4 g/dL (ref 3.5–5.2)
Alkaline Phosphatase: 80 U/L (ref 39–117)
BUN: 18 mg/dL (ref 6–23)
CALCIUM: 10.3 mg/dL (ref 8.4–10.5)
CHLORIDE: 104 meq/L (ref 96–112)
CO2: 29 meq/L (ref 19–32)
CREATININE: 1.04 mg/dL (ref 0.50–1.10)
GLUCOSE: 90 mg/dL (ref 70–99)
Potassium: 4.6 mEq/L (ref 3.5–5.3)
Sodium: 141 mEq/L (ref 135–145)
Total Bilirubin: 0.7 mg/dL (ref 0.2–1.2)
Total Protein: 7.1 g/dL (ref 6.0–8.3)

## 2014-01-18 LAB — LIPID PANEL
CHOLESTEROL: 212 mg/dL — AB (ref 0–200)
HDL: 75 mg/dL (ref >39)
LDL Cholesterol: 121 mg/dL — ABNORMAL HIGH (ref 0–99)
Total CHOL/HDL Ratio: 2.8 Ratio
Triglycerides: 82 mg/dL (ref <150)
VLDL: 16 mg/dL (ref 0–40)

## 2014-03-01 ENCOUNTER — Ambulatory Visit (AMBULATORY_SURGERY_CENTER): Payer: Medicare HMO | Admitting: *Deleted

## 2014-03-01 VITALS — Ht 62.0 in | Wt 188.0 lb

## 2014-03-01 DIAGNOSIS — Z86010 Personal history of colonic polyps: Secondary | ICD-10-CM

## 2014-03-01 MED ORDER — NA SULFATE-K SULFATE-MG SULF 17.5-3.13-1.6 GM/177ML PO SOLN
1.0000 | Freq: Once | ORAL | Status: DC
Start: 2014-03-01 — End: 2014-03-15

## 2014-03-01 NOTE — Progress Notes (Signed)
No allergies to eggs or soy. No problems with anesthesia.  Pt given Emmi instructions for colonoscopy  No oxygen use  No diet drug use  

## 2014-03-15 ENCOUNTER — Ambulatory Visit (AMBULATORY_SURGERY_CENTER): Payer: Medicare HMO | Admitting: Internal Medicine

## 2014-03-15 ENCOUNTER — Encounter: Payer: Self-pay | Admitting: Internal Medicine

## 2014-03-15 VITALS — BP 106/37 | HR 58 | Temp 96.5°F | Resp 14 | Ht 62.0 in | Wt 188.0 lb

## 2014-03-15 DIAGNOSIS — D126 Benign neoplasm of colon, unspecified: Secondary | ICD-10-CM

## 2014-03-15 DIAGNOSIS — Z86010 Personal history of colonic polyps: Secondary | ICD-10-CM

## 2014-03-15 DIAGNOSIS — K5730 Diverticulosis of large intestine without perforation or abscess without bleeding: Secondary | ICD-10-CM

## 2014-03-15 MED ORDER — SODIUM CHLORIDE 0.9 % IV SOLN: 500.0000 mL | INTRAVENOUS | Status: DC

## 2014-03-15 NOTE — Patient Instructions (Addendum)
I found and removed one small polyp that looks benign. You still have diverticulosis - thickened muscle rings and pouches in the colon wall. Please read the handout about this condition.   I will let you know pathology results and if/when to have another routine colonoscopy by mail.   I appreciate the opportunity to care for you. Gatha Mayer, MD, FACG  TRY TO INCREASE THE FIBER IN YOUR DIET WITH LIBERAL FLUID INTAKE. ALSO TRY BENEFIBER, 1-2 TABLESPOONS DAILY.  YOU HAD AN ENDOSCOPIC PROCEDURE TODAY AT [facility]: Refer to the procedure report that was given to you for any specific questions about what was found during the examination.  If the procedure report does not answer your questions, please call your gastroenterologist to clarify.  If you requested that your care partner not be given the details of your procedure findings, then the procedure report has been included in a sealed envelope for you to review at your convenience later.  YOU SHOULD EXPECT: Some feelings of bloating in the abdomen. Passage of more gas than usual.  Walking can help get rid of the air that was put into your GI tract during the procedure and reduce the bloating. If you had a lower endoscopy (such as a colonoscopy or flexible sigmoidoscopy) you may notice spotting of blood in your stool or on the toilet paper. If you underwent a bowel prep for your procedure, then you may not have a normal bowel movement for a few days.  DIET: Your first meal following the procedure should be a light meal and then it is ok to progress to your normal diet.  A half-sandwich or bowl of soup is an example of a good first meal.  Heavy or fried foods are harder to digest and may make you feel nauseous or bloated.  Likewise meals heavy in dairy and vegetables can cause extra gas to form and this can also increase the bloating.  Drink plenty of fluids but you should avoid alcoholic beverages for 24 hours.  ACTIVITY: Your  care partner should take you home directly after the procedure.  You should plan to take it easy, moving slowly for the rest of the day.  You can resume normal activity the day after the procedure however you should NOT DRIVE or use heavy machinery for 24 hours (because of the sedation medicines used during the test).    SYMPTOMS TO REPORT IMMEDIATELY: A gastroenterologist can be reached at any hour.  During normal business hours, 8:30 AM to 5:00 PM Monday through Friday, call [phone].  After hours and on weekends, please call the GI answering service at [phone] who will take a message and have the physician on call contact you.   Following lower endoscopy (colonoscopy or flexible sigmoidoscopy):  Excessive amounts of blood in the stool  Significant tenderness or worsening of abdominal pains  Swelling of the abdomen that is new, acute  Fever of 100F or higher  FOLLOW UP: If any biopsies were taken you will be contacted by phone or by letter within the next 1-3 weeks.  Call your gastroenterologist if you have not heard about the biopsies in 3 weeks.  Our staff will call the home number listed on your records the next business day following your procedure to check on you and address any questions or concerns that you may have at that time regarding the information given to you following your procedure. This is a courtesy call and so if there  is no answer at the home number and we have not heard from you through the emergency physician on call, we will assume that you have returned to your regular daily activities without incident.  SIGNATURES/CONFIDENTIALITY: You and/or your care partner have signed paperwork which will be entered into your electronic medical record.  These signatures attest to the fact that that the information above on your After Visit Summary has been reviewed and is understood.  Full responsibility of the confidentiality of this discharge information lies with you  and/or your care-partner.

## 2014-03-15 NOTE — Progress Notes (Signed)
Called to room to assist during endoscopic procedure.  Patient ID and intended procedure confirmed with present staff. Received instructions for my participation in the procedure from the performing physician.  

## 2014-03-15 NOTE — Op Note (Signed)
[facility]  [address] [place], [id number]   COLONOSCOPY PROCEDURE REPORT  PATIENT: Cristina, Sawyer  MR#: [id number] BIRTHDATE: 1941-05-18 , 57  yrs. old GENDER: Female ENDOSCOPIST: Gatha Mayer, MD, [facility] PROCEDURE DATE:  03/15/2014 PROCEDURE:   Colonoscopy with snare polypectomy First Screening Colonoscopy - Avg.  risk and is 50 yrs.  old or older - No.  Prior Negative Screening - Now for repeat screening. N/A  History of Adenoma - Now for follow-up colonoscopy & has been > or = to 3 yrs.  Yes hx of adenoma.  Has been 3 or more years since last colonoscopy.  Polyps Removed Today? Yes. ASA CLASS:   Class II INDICATIONS:Patient's personal history of adenomatous colon polyps.  MEDICATIONS: Propofol (Diprivan) 270 mg IV, MAC sedation, administered by CRNA, and These medications were titrated to patient response per physician's verbal order  DESCRIPTION OF PROCEDURE:   After the risks benefits and alternatives of the procedure were thoroughly explained, informed consent was obtained.  A digital rectal exam revealed no abnormalities of the rectum.   The [id number]  endoscope was introduced through the anus and advanced to the cecum, which was identified by both the appendix and ileocecal valve. No adverse events experienced.   The quality of the prep was excellent using Suprep  The instrument was then slowly withdrawn as the colon was fully examined.  COLON FINDINGS: A sessile polyp measuring 6 mm in size was found in the transverse colon.  A polypectomy was performed with a cold snare.  The resection was complete and the polyp tissue was completely retrieved.   Severe diverticulosis was noted in the sigmoid colon.   The colon mucosa was otherwise normal.   A right colon retroflexion was performed.  Retroflexed views revealed no abnormalities. The time to cecum=3 minutes 53 seconds.  Withdrawal time=9 minutes 34 seconds.  The scope was withdrawn  and the procedure completed. COMPLICATIONS: There were no complications.  ENDOSCOPIC IMPRESSION: 1.   Sessile polyp measuring 6 mm in size was found in the transverse colon; polypectomy was performed with a cold snare 2.   Severe diverticulosis was noted in the sigmoid colon 3.   The colon mucosa was otherwise normal - excellent prep - hx 2 mm adenoma 2009  RECOMMENDATIONS: 1.  Timing of repeat colonoscopy will be determined by pathology findings. 2.   Consider 5 years most likely  eSigned:  Gatha Mayer, MD, [facility] 03/15/2014 11:32 AM   cc: The Patient

## 2014-03-15 NOTE — Progress Notes (Signed)
A/ox3 pleased with MAC, report to Karen RN 

## 2014-03-16 ENCOUNTER — Telehealth: Payer: Self-pay | Admitting: *Deleted

## 2014-03-16 NOTE — Telephone Encounter (Signed)
  Follow up Call-  Call back number 03/15/2014  Post procedure Call Back phone  # [phone]  Permission to leave phone message Yes     Patient questions  Do you have a fever, pain , or abdominal swelling? No. Pain Score  0 *  Have you tolerated food without any problems? Yes.    Have you been able to return to your normal activities? Yes.    Do you have any questions about your discharge instructions: Diet   No. Medications  No. Follow up visit  No.  Do you have questions or concerns about your Care? No.  Actions: * If pain score is 4 or above: No action needed, pain <4.

## 2014-03-25 ENCOUNTER — Encounter: Payer: Self-pay | Admitting: Internal Medicine

## 2014-03-25 NOTE — Progress Notes (Signed)
Quick Note:  6 mm adenoma Recall colon 2020 - consider repeat then - she will be 72 ______

## 2014-04-04 ENCOUNTER — Encounter: Payer: Self-pay | Admitting: Family Medicine

## 2014-04-04 ENCOUNTER — Ambulatory Visit (INDEPENDENT_AMBULATORY_CARE_PROVIDER_SITE_OTHER): Payer: Medicare HMO | Admitting: Family Medicine

## 2014-04-04 VITALS — BP 142/80 | HR 76 | Temp 98.3°F | Ht 62.5 in | Wt 185.0 lb

## 2014-04-04 DIAGNOSIS — R142 Eructation: Secondary | ICD-10-CM

## 2014-04-04 DIAGNOSIS — R141 Gas pain: Secondary | ICD-10-CM

## 2014-04-04 DIAGNOSIS — K219 Gastro-esophageal reflux disease without esophagitis: Secondary | ICD-10-CM

## 2014-04-04 DIAGNOSIS — R140 Abdominal distension (gaseous): Secondary | ICD-10-CM

## 2014-04-04 DIAGNOSIS — R143 Flatulence: Secondary | ICD-10-CM

## 2014-04-04 DIAGNOSIS — R1031 Right lower quadrant pain: Secondary | ICD-10-CM

## 2014-04-04 NOTE — Progress Notes (Signed)
Chief Complaint  Patient presents with  . Bloated    abdominal cramping and bloating along with gas since colonoscopy 03/15/14. Instructed by GI to increase daily fiber, also to add Benefiber and increase water intake. She feels that this has made her situation worse.    Some days she feels better than others, and some days she feels much worse.  "not too bad" today.  Pain started when doing the prep for the colonoscopy--made her nauseated, and had a lot of gas.  Ongoing problems with gas and pain in the right lower abdomen since the colonoscopy.  Benefiber has made it worse. The last 3 days she stopped the fiber supplement and high fiber diet, went to a soft diet.  She is belching a lot after eating, and pain is somewhat aggravated after eating, even just with drinking water. She took Nexium 3x last week--helps with the reflux into her chest, didn't help much with the rest of her symptoms.  Denies any blood or mucus in her stools.  She has long-standing constipation.  She has been going more regularly than she had been (since drinking more water, and high fiber diet).  She thinks she might have had fever--feeling hot at times, feels better using a cold towel.  Hasn't checked her temperature.  Results of colonoscopy from 03/15/14 ENDOSCOPIC IMPRESSION: 1. Sessile polyp measuring 6 mm in size was found in the transverse colon; polypectomy was performed with a cold snare 2. Severe diverticulosis was noted in the sigmoid colon 3. The colon mucosa was otherwise normal   Past Medical History  Diagnosis Date  . GERD (gastroesophageal reflux disease)   . Dyslipidemia   . Diverticulosis   . Vitamin D deficiency   . Osteoporosis     OSTEOPENIA  . CREST syndrome     pt denies (saw rheum)  . Arthritis   . Personal history of colonic adenoma 08/09/2008  . Cataract    Past Surgical History  Procedure Laterality Date  . Abdominal hysterectomy  1975  . Appendectomy  1975  . Thyroid nodule  2000  .  Colonoscopy     History   Social History  . Marital Status: Widowed    Spouse Name: N/A    Number of Children: N/A  . Years of Education: N/A   Occupational History  . Not on file.   Social History Main Topics  . Smoking status: Never Smoker   . Smokeless tobacco: Never Used  . Alcohol Use: No  . Drug Use: No  . Sexual Activity: Yes   Other Topics Concern  . Not on file   Social History Narrative  . No narrative on file   Outpatient Encounter Prescriptions as of 04/04/2014  Medication Sig Note  . esomeprazole (NEXIUM) 40 MG capsule Take 1 capsule (40 mg total) by mouth daily. 04/04/2014: As needed   Allergies  Allergen Reactions  . Codeine     REACTION: itch/nausea  . Ciprofloxacin Hcl Rash   ROS:  Denies chest pain, palpitations, shortness of breath.  No vomiting.  No urinary complaints. No URI symptoms, cough.  Possible tactile fevers, no chills.  No dizziness, headaches.  See HPI.  PHYSICAL EXAM: BP 142/80  Pulse 76  Temp(Src) 98.3 F (36.8 C) (Tympanic)  Ht 5' 2.5" (1.588 m)  Wt 185 lb (83.915 kg)  BMI 33.28 kg/m2 Well developed, pleasant female in no distress.  Loud belch during visit Neck: no lymphadenopathy or mass Heart: regular rate and rhythm without murmur Lungs:  clear bilaterally Abdomen:  Normal bowel sounds, soft.  Mild epigastric tenderness, and tender in the RLQ (lateral to McBurney's point). No mass, rebound tenderness or guarding.  Negative Murphy sign. Extremities: no edema Neuro: alert and oriented.  Normal strength, gait Psych: normal mood, affect, hygiene and grooming  ASSESSMENT/PLAN:  RLQ abdominal pain  Gastroesophageal reflux disease, esophagitis presence not specified  Bloating  Abdominal pain--no evidence of obstruction, or infection.  May have a component related to reflux/gastritis.   Restart the nexium regularly for a couple of weeks.  Avoid spicy/acidic foods, including citrus. Consider eating just bland/soft foods for a  week, and advance the diet as tolerated. Continue to drink plenty of fluids.  Okay to stop fiber supplements.  Continue to eat high fiber foods in the diet, and drink plenty of fluids. I recommend a trial of probiotics (ie Align, or even consider the "active" yogurts--Activia, or Dan-Active). Simethicone (ie Gas-X) can be used as needed for bloating and gas.  Follow up in 2 weeks if not improving, sooner if worse (ie vomiting, more severe pain, fever) or other concerns.

## 2014-04-04 NOTE — Patient Instructions (Signed)
  Abdominal pain--no evidence of obstruction, or infection.  May have a component related to reflux/gastritis.   Restart the nexium regularly for a couple of weeks.  Avoid spicy/acidic foods, including citrus. Consider eating just bland/soft foods for a week, and advance the diet as tolerated. Continue to drink plenty of fluids.  Okay to stop fiber supplements.  Continue to eat high fiber foods in the diet, and drink plenty of fluids. I recommend a trial of probiotics (ie Align, or even consider the "active" yogurts--Activia, or Dan-Active). Simethicone (ie Gas-X) can be used as needed for bloating and gas.  Follow up in 2 weeks if not improving, sooner if worse (ie vomiting, more severe pain, fever) or other concerns.

## 2014-08-05 ENCOUNTER — Telehealth: Payer: Self-pay | Admitting: Internal Medicine

## 2014-08-05 NOTE — Telephone Encounter (Signed)
Faxed medical records over to [facility] @ [phone] on 07/28/14

## 2014-12-29 ENCOUNTER — Encounter: Payer: Self-pay | Admitting: Family Medicine

## 2014-12-29 ENCOUNTER — Ambulatory Visit (INDEPENDENT_AMBULATORY_CARE_PROVIDER_SITE_OTHER): Payer: Medicare HMO | Admitting: Family Medicine

## 2014-12-29 VITALS — BP 130/70 | Temp 98.3°F | Wt 189.0 lb

## 2014-12-29 DIAGNOSIS — J301 Allergic rhinitis due to pollen: Secondary | ICD-10-CM | POA: Insufficient documentation

## 2014-12-29 DIAGNOSIS — R040 Epistaxis: Secondary | ICD-10-CM | POA: Diagnosis not present

## 2014-12-29 NOTE — Progress Notes (Signed)
   Subjective:    Patient ID: Cristina Sawyer, female    DOB: 1941-03-29, 74 y.o.   MRN: [id number]  HPI She has a history of springtime allergies with sneezing, itchy watery eyes, nasal congestion, PND. She has been using a Netti pot as well as an over-the-counter allergy medication. She has on several occasions also noted right-sided nosebleeds. She then stopped using the Netti pot.   Review of Systems     Objective:   Physical Exam Alert and in no distress. Nasal mucosa is normal with only a minimal area of erythema but no true vascularity noted in the medial naresTympanic membranes and canals are normal. Pharyngeal area is normal. Neck is supple without adenopathy or thyromegaly. Cardiac exam shows a regular sinus rhythm without murmurs or gallops. Lungs are clear to auscultation.        Assessment & Plan:  Allergic rhinitis due to pollen  Epistaxis I explained that she really had very minimal vasculature and I didn't think cauterizing would be worthwhile. Recommend antihistamine of choice as well as Nasacort. If she has trouble with that she will call.

## 2014-12-29 NOTE — Patient Instructions (Signed)
Use loratadine, Allegra or Zyrtec. Try Nasacort is all spray. Try Ayr. You can continue to use the Netti pot

## 2015-01-18 ENCOUNTER — Encounter (HOSPITAL_COMMUNITY): Payer: Self-pay | Admitting: *Deleted

## 2015-01-18 ENCOUNTER — Emergency Department (HOSPITAL_COMMUNITY): Payer: Medicare HMO

## 2015-01-18 ENCOUNTER — Emergency Department (HOSPITAL_COMMUNITY)
Admission: EM | Admit: 2015-01-18 | Discharge: 2015-01-18 | Disposition: A | Discharge status: Home / Self Care | Payer: Medicare HMO | Attending: Emergency Medicine | Admitting: Emergency Medicine

## 2015-01-18 DIAGNOSIS — Z86010 Personal history of colonic polyps: Secondary | ICD-10-CM | POA: Diagnosis not present

## 2015-01-18 DIAGNOSIS — Z79899 Other long term (current) drug therapy: Secondary | ICD-10-CM | POA: Insufficient documentation

## 2015-01-18 DIAGNOSIS — K2100 Gastro-esophageal reflux disease with esophagitis, without bleeding: Secondary | ICD-10-CM

## 2015-01-18 DIAGNOSIS — Z8739 Personal history of other diseases of the musculoskeletal system and connective tissue: Secondary | ICD-10-CM | POA: Diagnosis not present

## 2015-01-18 DIAGNOSIS — Z8639 Personal history of other endocrine, nutritional and metabolic disease: Secondary | ICD-10-CM | POA: Diagnosis not present

## 2015-01-18 DIAGNOSIS — Z8669 Personal history of other diseases of the nervous system and sense organs: Secondary | ICD-10-CM | POA: Insufficient documentation

## 2015-01-18 DIAGNOSIS — R1310 Dysphagia, unspecified: Secondary | ICD-10-CM | POA: Diagnosis present

## 2015-01-18 LAB — CBC
HEMATOCRIT: 44.1 % (ref 36.0–46.0)
HEMOGLOBIN: 14.9 g/dL (ref 12.0–15.0)
MCH: 28.6 pg (ref 26.0–34.0)
MCHC: 33.8 g/dL (ref 30.0–36.0)
MCV: 84.6 fL (ref 78.0–100.0)
Platelets: 231 10*3/uL (ref 150–400)
RBC: 5.21 MIL/uL — ABNORMAL HIGH (ref 3.87–5.11)
RDW: 14 % (ref 11.5–15.5)
WBC: 8.9 10*3/uL (ref 4.0–10.5)

## 2015-01-18 LAB — COMPREHENSIVE METABOLIC PANEL
ALBUMIN: 4.1 g/dL (ref 3.5–5.0)
ALT: 14 U/L (ref 14–54)
ANION GAP: 11 (ref 5–15)
AST: 17 U/L (ref 15–41)
Alkaline Phosphatase: 88 U/L (ref 38–126)
BILIRUBIN TOTAL: 0.5 mg/dL (ref 0.3–1.2)
BUN: 15 mg/dL (ref 6–20)
CHLORIDE: 106 mmol/L (ref 101–111)
CO2: 25 mmol/L (ref 22–32)
Calcium: 9.7 mg/dL (ref 8.9–10.3)
Creatinine, Ser: 1.11 mg/dL — ABNORMAL HIGH (ref 0.44–1.00)
GFR calc Af Amer: 55 mL/min — ABNORMAL LOW (ref >60)
GFR calc non Af Amer: 48 mL/min — ABNORMAL LOW (ref >60)
Glucose, Bld: 91 mg/dL (ref 70–99)
POTASSIUM: 4.5 mmol/L (ref 3.5–5.1)
Sodium: 142 mmol/L (ref 135–145)
TOTAL PROTEIN: 7.4 g/dL (ref 6.5–8.1)

## 2015-01-18 LAB — I-STAT TROPONIN, ED: Troponin i, poc: 0 ng/mL (ref 0.00–0.08)

## 2015-01-18 LAB — LIPASE, BLOOD: Lipase: 33 U/L (ref 22–51)

## 2015-01-18 MED ORDER — GI COCKTAIL ~~LOC~~
30.0000 mL | Freq: Once | ORAL | Status: AC
  Administered 2015-01-18: 30 mL via ORAL
  Filled 2015-01-18: qty 30

## 2015-01-18 MED ORDER — SODIUM CHLORIDE 0.9 % IV SOLN
1000.0000 mL | INTRAVENOUS | Status: DC
  Administered 2015-01-18: 1000 mL via INTRAVENOUS

## 2015-01-18 MED ORDER — PANTOPRAZOLE SODIUM 40 MG IV SOLR
40.0000 mg | Freq: Once | INTRAVENOUS | Status: AC
  Administered 2015-01-18: 40 mg via INTRAVENOUS
  Filled 2015-01-18: qty 40

## 2015-01-18 MED ORDER — SUCRALFATE 1 G PO TABS: 1.0000 g | ORAL_TABLET | Freq: Three times a day (TID) | ORAL | Status: DC

## 2015-01-18 NOTE — ED Provider Notes (Signed)
CSN: [id number]     Arrival date & time 01/18/15  1411 History   First MD Initiated Contact with Patient 01/18/15 1609     Chief Complaint  Patient presents with  . Dysphagia    HPI A couple of weeks ago she had some trouble with allergies.  In the last week she has had some difficulty with swallowing..  She feels like food is getting stopped in her chest when she swallows.   Her throat and neck are hurting and she has pain with swallowing.    She can eat soft foods but has trouble with more solid foods.  No fevers.  No vomiting.  She did have chills last night.  Last evening the pain was worse and she could not sleep well.  She has been taking omeprazole recently.  Her stools have seemed dark but she has changed her diet to include a lot of spinach.  No blood.  No melena Past Medical History  Diagnosis Date  . GERD (gastroesophageal reflux disease)   . Dyslipidemia   . Diverticulosis   . Vitamin D deficiency   . Osteoporosis     OSTEOPENIA  . CREST syndrome     pt denies (saw rheum)  . Arthritis   . Personal history of colonic adenoma 08/09/2008  . Cataract    Past Surgical History  Procedure Laterality Date  . Abdominal hysterectomy  1975  . Appendectomy  1975  . Thyroid nodule  2000  . Colonoscopy     Family History  Problem Relation Age of Onset  . Colon cancer Neg Hx    History  Substance Use Topics  . Smoking status: Never Smoker   . Smokeless tobacco: Never Used  . Alcohol Use: No   OB History    No data available     Review of Systems  All other systems reviewed and are negative.     Allergies  Codeine and Ciprofloxacin hcl  Home Medications   Prior to Admission medications   Medication Sig Start Date End Date Taking? Authorizing Provider  cetirizine (ZYRTEC) 10 MG tablet Take 10 mg by mouth every evening.   Yes Historical Provider, MD  omeprazole (PRILOSEC) 40 MG capsule Take 40 mg by mouth daily.   Yes Historical Provider, MD  sucralfate  (CARAFATE) 1 G tablet Take 1 tablet (1 g total) by mouth 4 (four) times daily -  with meals and at bedtime. 01/18/15   Dorie Rank, MD   BP 162/74 mmHg  Pulse 59  Temp(Src) 98.1 F (36.7 C) (Oral)  Resp 13  Ht 5\' 5"  (1.651 m)  Wt 189 lb (85.73 kg)  BMI 31.45 kg/m2  SpO2 98% Physical Exam  Constitutional: She appears well-developed and well-nourished. No distress.  HENT:  Head: Normocephalic and atraumatic.  Right Ear: Tympanic membrane and external ear normal.  Left Ear: Tympanic membrane and external ear normal.  Mouth/Throat: No oropharyngeal exudate.  Eyes: Conjunctivae are normal. Right eye exhibits no discharge. Left eye exhibits no discharge. No scleral icterus.  Neck: Neck supple. No tracheal deviation present.  Cardiovascular: Normal rate, regular rhythm and intact distal pulses.   Pulmonary/Chest: Effort normal and breath sounds normal. No stridor. No respiratory distress. She has no wheezes. She has no rales.  Abdominal: Soft. Bowel sounds are normal. She exhibits no distension. There is no tenderness. There is no rebound and no guarding.  Musculoskeletal: She exhibits no edema or tenderness.  Neurological: She is alert. She has normal strength. No  cranial nerve deficit (no facial droop, extraocular movements intact, no slurred speech) or sensory deficit. She exhibits normal muscle tone. She displays no seizure activity. Coordination normal.  Skin: Skin is warm and dry. No rash noted.  Psychiatric: She has a normal mood and affect.  Nursing note and vitals reviewed.   ED Course  Procedures (including critical care time) Labs Review Labs Reviewed  CBC - Abnormal; Notable for the following:    RBC 5.21 (*)    All other components within normal limits  COMPREHENSIVE METABOLIC PANEL - Abnormal; Notable for the following:    Creatinine, Ser 1.11 (*)    GFR calc non Af Amer 48 (*)    GFR calc Af Amer 55 (*)    All other components within normal limits  LIPASE, BLOOD  I-STAT  TROPOININ, ED    Imaging Review Dg Chest 2 View  01/18/2015   CLINICAL DATA:  Substernal chest pain and shortness of Breath for 10 days  EXAM: CHEST  2 VIEW  COMPARISON:  04/02/2013  FINDINGS: The heart size and mediastinal contours are within normal limits. Both lungs are clear. The visualized skeletal structures show degenerative change of the thoracic spine.  IMPRESSION: No active cardiopulmonary disease.   Electronically Signed   By: Inez Catalina M.D.   On: 01/18/2015 16:58     EKG Interpretation   Date/Time:  Wednesday Jan 18 2015 14:31:35 EDT Ventricular Rate:  73 PR Interval:  172 QRS Duration: 68 QT Interval:  376 QTC Calculation: 414 R Axis:   56 Text Interpretation:  Normal sinus rhythm Cannot rule out Anterior infarct  , age undetermined Abnormal ECG No significant change since last tracing  Confirmed by Calem Cocozza  MD-J, Jameriah Trotti ([id number]) on 01/18/2015 4:15:18 PM      MDM   Final diagnoses:  Gastroesophageal reflux disease with esophagitis    Her symptoms are suggestive of gastroesophageal reflux and esophagitis. She has pain with swallowing.  Symptoms seem to be worse at night. Her workup does not suggest any issues with acute coronary disease. I doubt pneumonia or pulmonary embolism. She improved with the GI cocktail and Protonix. Patient is already on Prilosec. I will add Carafate. Follow up with her primary care doctor. Monitor for weakness, blood in her stool, worsening symptoms.    Dorie Rank, MD 01/18/15 [phone]

## 2015-01-18 NOTE — ED Notes (Signed)
Pt reports she has hx of gerd, has had sore throat with tightness for a few days. Respirations e/u, pts voice clear, no distress noted.

## 2015-01-18 NOTE — Discharge Instructions (Signed)
Gastroesophageal Reflux Disease, Adult Gastroesophageal reflux disease (GERD) happens when acid from your stomach flows up into the esophagus. When acid comes in contact with the esophagus, the acid causes soreness (inflammation) in the esophagus. Over time, GERD may create small holes (ulcers) in the lining of the esophagus. CAUSES   Increased body weight. This puts pressure on the stomach, making acid rise from the stomach into the esophagus.  Smoking. This increases acid production in the stomach.  Drinking alcohol. This causes decreased pressure in the lower esophageal sphincter (valve or ring of muscle between the esophagus and stomach), allowing acid from the stomach into the esophagus.  Late evening meals and a full stomach. This increases pressure and acid production in the stomach.  A malformed lower esophageal sphincter. Sometimes, no cause is found. SYMPTOMS   Burning pain in the lower part of the mid-chest behind the breastbone and in the mid-stomach area. This may occur twice a week or more often.  Trouble swallowing.  Sore throat.  Dry cough.  Asthma-like symptoms including chest tightness, shortness of breath, or wheezing. DIAGNOSIS  Your caregiver may be able to diagnose GERD based on your symptoms. In some cases, X-rays and other tests may be done to check for complications or to check the condition of your stomach and esophagus. TREATMENT  Your caregiver may recommend over-the-counter or prescription medicines to help decrease acid production. Ask your caregiver before starting or adding any new medicines.  HOME CARE INSTRUCTIONS   Change the factors that you can control. Ask your caregiver for guidance concerning weight loss, quitting smoking, and alcohol consumption.  Avoid foods and drinks that make your symptoms worse, such as:  Caffeine or alcoholic drinks.  Chocolate.  Peppermint or mint flavorings.  Garlic and onions.  Spicy foods.  Citrus fruits,  such as oranges, lemons, or limes.  Tomato-based foods such as sauce, chili, salsa, and pizza.  Fried and fatty foods.  Avoid lying down for the 3 hours prior to your bedtime or prior to taking a nap.  Eat small, frequent meals instead of large meals.  Wear loose-fitting clothing. Do not wear anything tight around your waist that causes pressure on your stomach.  Raise the head of your bed 6 to 8 inches with wood blocks to help you sleep. Extra pillows will not help.  Only take over-the-counter or prescription medicines for pain, discomfort, or fever as directed by your caregiver.  Do not take aspirin, ibuprofen, or other nonsteroidal anti-inflammatory drugs (NSAIDs). SEEK IMMEDIATE MEDICAL CARE IF:   You have pain in your arms, neck, jaw, teeth, or back.  Your pain increases or changes in intensity or duration.  You develop nausea, vomiting, or sweating (diaphoresis).  You develop shortness of breath, or you faint.  Your vomit is green, yellow, black, or looks like coffee grounds or blood.  Your stool is red, bloody, or black. These symptoms could be signs of other problems, such as heart disease, gastric bleeding, or esophageal bleeding. MAKE SURE YOU:   Understand these instructions.  Will watch your condition.  Will get help right away if you are not doing well or get worse. Document Released: 06/12/2005 Document Revised: 11/25/2011 Document Reviewed: 03/22/2011 Exit[employer] Patient Information 2015 [employer]. This information is not intended to replace advice given to you by your health care provider. Make sure you discuss any questions you have with your health care provider.  

## 2015-01-18 NOTE — ED Notes (Signed)
Pt reports having gerd, recent sore throat and difficultly swallowing x 2 weeks.  Airway is intact.

## 2015-01-23 ENCOUNTER — Encounter: Payer: Self-pay | Admitting: Family Medicine

## 2015-01-23 ENCOUNTER — Ambulatory Visit (INDEPENDENT_AMBULATORY_CARE_PROVIDER_SITE_OTHER): Payer: Medicare HMO | Admitting: Family Medicine

## 2015-01-23 VITALS — BP 140/80 | HR 73 | Wt 179.8 lb

## 2015-01-23 DIAGNOSIS — K2100 Gastro-esophageal reflux disease with esophagitis, without bleeding: Secondary | ICD-10-CM

## 2015-01-23 DIAGNOSIS — J301 Allergic rhinitis due to pollen: Secondary | ICD-10-CM | POA: Diagnosis not present

## 2015-01-23 MED ORDER — PANTOPRAZOLE SODIUM 40 MG PO TBEC: 40.0000 mg | DELAYED_RELEASE_TABLET | Freq: Every day | ORAL | Status: DC

## 2015-01-23 MED ORDER — SUCRALFATE 1 G PO TABS
1.0000 g | ORAL_TABLET | Freq: Three times a day (TID) | ORAL | Status: DC
Start: 2015-01-23 — End: 2015-07-04

## 2015-01-23 NOTE — Patient Instructions (Signed)
Take both medications for at least another week and as long as your symptoms go away were okay. If you feel like solids are getting stuck let me know. Switch to Nasacort if you need to for your Allergies

## 2015-01-23 NOTE — Progress Notes (Signed)
   Subjective:    Patient ID: Aggie Moats, female    DOB: 1940/10/07, 74 y.o.   MRN: [id number]  HPI He is here for follow-up on recent visit to the emergency room. The ER record was reviewed and did show evidence of esophagitis and reflux. She was given Protonix and Carafate as well as a GI cocktail. She states that she is feeling better but still having some slight chest discomfort. She does note questionable difference between solids and liquids but states both are getting better. She also states she has had some difficulty with Flonase causing some posterior pharyngealirritation.    Review of Systems     Objective:   Physical Exam Alert and in no distress otherwise not examined       Assessment & Plan:  Allergic rhinitis due to pollen  Gastroesophageal reflux disease with esophagitis - Plan: pantoprazole (PROTONIX) 40 MG tablet, sucralfate (CARAFATE) 1 G tablet Recommend she switch to Nasacort and instructed on proper use of the steroid inhalation. Recommend she get it into the anterior part of the nose rather than deeply inhaling. She will continue on Protonix and Carafate and as long as she continues to improve, no further intervention. Explained that I would like to eventually get her off her Carafate however she has difficulty especially with swallowing solids she will let me know.

## 2015-03-23 ENCOUNTER — Encounter: Payer: Self-pay | Admitting: Internal Medicine

## 2015-07-04 ENCOUNTER — Encounter: Payer: Self-pay | Admitting: Family Medicine

## 2015-07-04 ENCOUNTER — Ambulatory Visit (INDEPENDENT_AMBULATORY_CARE_PROVIDER_SITE_OTHER): Payer: Medicare HMO | Admitting: Family Medicine

## 2015-07-04 VITALS — BP 140/80 | HR 82 | Ht 62.5 in | Wt 194.0 lb

## 2015-07-04 DIAGNOSIS — M25561 Pain in right knee: Secondary | ICD-10-CM

## 2015-07-04 DIAGNOSIS — Z23 Encounter for immunization: Secondary | ICD-10-CM

## 2015-07-04 NOTE — Progress Notes (Signed)
   Subjective:    Patient ID: Aggie Moats, female    DOB: 02/05/1941, 74 y.o.   MRN: [id number]  HPI She complains of a 2 day history of right posterolateral pain. No history of injury. She notes the pain did get worse when she would stand up but be intermittent after that. No popping, locking or grinding. The pain is now gone. She was concerned about possible blood clot.   Review of Systems     Objective:   Physical Exam Alert and in no distress. Full motion of the knee. No palpable tenderness to the knee. Negative Homans sign. No visible signs.       Assessment & Plan:  Need for prophylactic vaccination and inoculation against influenza - Plan: Flu vaccine HIGH DOSE PF (Fluzone High dose)  Right knee pain I explained to the did not think she was in any danger. Since she's not having any trouble, at this point nothing further needs to be done.

## 2015-10-27 ENCOUNTER — Encounter: Payer: Self-pay | Admitting: Family Medicine

## 2015-10-27 ENCOUNTER — Ambulatory Visit (INDEPENDENT_AMBULATORY_CARE_PROVIDER_SITE_OTHER): Payer: PPO | Admitting: Family Medicine

## 2015-10-27 VITALS — BP 126/80 | HR 80 | Temp 98.5°F | Wt 194.0 lb

## 2015-10-27 DIAGNOSIS — K2100 Gastro-esophageal reflux disease with esophagitis, without bleeding: Secondary | ICD-10-CM

## 2015-10-27 DIAGNOSIS — J0140 Acute pansinusitis, unspecified: Secondary | ICD-10-CM

## 2015-10-27 MED ORDER — AMOXICILLIN 875 MG PO TABS: 875.0000 mg | ORAL_TABLET | Freq: Two times a day (BID) | ORAL | Status: DC

## 2015-10-27 MED ORDER — PANTOPRAZOLE SODIUM 40 MG PO TBEC: 40.0000 mg | DELAYED_RELEASE_TABLET | Freq: Every day | ORAL | Status: DC

## 2015-10-27 NOTE — Patient Instructions (Addendum)
Stay well hydrated and let us know if you are not back to normal after completing the antibiotic.  Sinusitis, Adult Sinusitis is redness, soreness, and inflammation of the paranasal sinuses. Paranasal sinuses are air pockets within the bones of your face. They are located beneath your eyes, in the middle of your forehead, and above your eyes. In healthy paranasal sinuses, mucus is able to drain out, and air is able to circulate through them by way of your nose. However, when your paranasal sinuses are inflamed, mucus and air can become trapped. This can allow bacteria and other germs to grow and cause infection. Sinusitis can develop quickly and last only a short time (acute) or continue over a long period (chronic). Sinusitis that lasts for more than 12 weeks is considered chronic. CAUSES Causes of sinusitis include:  Allergies.  Structural abnormalities, such as displacement of the cartilage that separates your nostrils (deviated septum), which can decrease the air flow through your nose and sinuses and affect sinus drainage.  Functional abnormalities, such as when the small hairs (cilia) that line your sinuses and help remove mucus do not work properly or are not present. SIGNS AND SYMPTOMS Symptoms of acute and chronic sinusitis are the same. The primary symptoms are pain and pressure around the affected sinuses. Other symptoms include:  Upper toothache.  Earache.  Headache.  Bad breath.  Decreased sense of smell and taste.  A cough, which worsens when you are lying flat.  Fatigue.  Fever.  Thick drainage from your nose, which often is green and may contain pus (purulent).  Swelling and warmth over the affected sinuses. DIAGNOSIS Your health care provider will perform a physical exam. During your exam, your health care provider may perform any of the following to help determine if you have acute sinusitis or chronic sinusitis:  Look in your nose for signs of abnormal growths  in your nostrils (nasal polyps).  Tap over the affected sinus to check for signs of infection.  View the inside of your sinuses using an imaging device that has a light attached (endoscope). If your health care provider suspects that you have chronic sinusitis, one or more of the following tests may be recommended:  Allergy tests.  Nasal culture. A sample of mucus is taken from your nose, sent to a lab, and screened for bacteria.  Nasal cytology. A sample of mucus is taken from your nose and examined by your health care provider to determine if your sinusitis is related to an allergy. TREATMENT Most cases of acute sinusitis are related to a viral infection and will resolve on their own within 10 days. Sometimes, medicines are prescribed to help relieve symptoms of both acute and chronic sinusitis. These may include pain medicines, decongestants, nasal steroid sprays, or saline sprays. However, for sinusitis related to a bacterial infection, your health care provider will prescribe antibiotic medicines. These are medicines that will help kill the bacteria causing the infection. Rarely, sinusitis is caused by a fungal infection. In these cases, your health care provider will prescribe antifungal medicine. For some cases of chronic sinusitis, surgery is needed. Generally, these are cases in which sinusitis recurs more than 3 times per year, despite other treatments. HOME CARE INSTRUCTIONS  Drink plenty of water. Water helps thin the mucus so your sinuses can drain more easily.  Use a humidifier.  Inhale steam 3-4 times a day (for example, sit in the bathroom with the shower running).  Apply a warm, moist washcloth to your face  3-4 times a day, or as directed by your health care provider.  Use saline nasal sprays to help moisten and clean your sinuses.  Take medicines only as directed by your health care provider.  If you were prescribed either an antibiotic or antifungal medicine, finish  it all even if you start to feel better. SEEK IMMEDIATE MEDICAL CARE IF:  You have increasing pain or severe headaches.  You have nausea, vomiting, or drowsiness.  You have swelling around your face.  You have vision problems.  You have a stiff neck.  You have difficulty breathing.   This information is not intended to replace advice given to you by your health care provider. Make sure you discuss any questions you have with your health care provider.   Document Released: 09/02/2005 Document Revised: 09/23/2014 Document Reviewed: 09/17/2011 Elsevier Interactive Patient Education [employer].

## 2015-10-27 NOTE — Progress Notes (Signed)
Subjective:  Cristina Sawyer is a 75 y.o. female who presents for possible sinus infection.  Symptoms include a 5 day history of cough, sore throat, fatigue, sinus congestion, pressure, and upper tooth pain.  She states the cough has improved significantly however the sinus pressure is intense. Denies fever, chills, ear pain, chest pain, DOE.   Past history is significant for no history of pneumonia or bronchitis. Patient is a non-smoker.  Using Coricidin for symptoms.  Denies sick contacts.  No other aggravating or relieving factors.  No other c/o.  She is also requesting a refill on her Protonix. She states she is due to schedule her physical with Dr. Redmond School soon.  She states she does take this daily.  ROS as in subjective   Objective: Filed Vitals:   10/27/15 1338  BP: 126/80  Pulse: 80  Temp: 98.5 F (36.9 C)    General appearance: Alert, WD/WN, no distress                             Skin: warm, no rash                           Head: + marked sinus tenderness                            Eyes: conjunctiva normal, corneas clear, PERRLA                            Ears: pearly TMs, external ear canals normal                          Nose: septum midline, turbinates swollen, with erythema and clear discharge             Mouth/throat: MMM, tongue normal, mild pharyngeal erythema                           Neck: supple, no adenopathy, no thyromegaly, nontender                          Heart: RRR, normal S1, S2, no murmurs                         Lungs: CTA bilaterally, no wheezes, rales, or rhonchi      Assessment and Plan:  Acute pansinusitis, recurrence not specified - Plan: amoxicillin (AMOXIL) 875 MG tablet  Gastroesophageal reflux disease with esophagitis - Plan: pantoprazole (PROTONIX) 40 MG tablet   Prescription given for Amoxicillin.  Can use OTC Mucinex for congestion.  Tylenol or Ibuprofen OTC for fever and malaise.  Discussed symptomatic relief, nasal saline flush, and  salt water gargles. She will call or return if worse or not back to baseline after completing antibiotic. I also refilled her Protonix and discussed trying to skip certain days of the week and only take this if absolutely necessary. She does have a history of osteoporosis and discussed long-term use of PPIs. Discussed her taking the minimal effective dose to relieve her symptoms. She will follow-up with Dr. Redmond School for a physical.

## 2016-01-09 ENCOUNTER — Encounter: Payer: Self-pay | Admitting: Family Medicine

## 2016-01-09 ENCOUNTER — Ambulatory Visit (INDEPENDENT_AMBULATORY_CARE_PROVIDER_SITE_OTHER): Payer: PPO | Admitting: Family Medicine

## 2016-01-09 VITALS — BP 128/80 | HR 68 | Ht 63.5 in | Wt 194.0 lb

## 2016-01-09 DIAGNOSIS — Z23 Encounter for immunization: Secondary | ICD-10-CM | POA: Diagnosis not present

## 2016-01-09 DIAGNOSIS — Z0000 Encounter for general adult medical examination without abnormal findings: Secondary | ICD-10-CM | POA: Diagnosis not present

## 2016-01-09 DIAGNOSIS — J301 Allergic rhinitis due to pollen: Secondary | ICD-10-CM | POA: Diagnosis not present

## 2016-01-09 DIAGNOSIS — K2100 Gastro-esophageal reflux disease with esophagitis, without bleeding: Secondary | ICD-10-CM

## 2016-01-09 DIAGNOSIS — I7300 Raynaud's syndrome without gangrene: Secondary | ICD-10-CM | POA: Diagnosis not present

## 2016-01-09 LAB — COMPREHENSIVE METABOLIC PANEL
ALBUMIN: 4.5 g/dL (ref 3.6–5.1)
ALK PHOS: 90 U/L (ref 33–130)
ALT: 11 U/L (ref 6–29)
AST: 16 U/L (ref 10–35)
BILIRUBIN TOTAL: 0.7 mg/dL (ref 0.2–1.2)
BUN: 16 mg/dL (ref 7–25)
CO2: 27 mmol/L (ref 20–31)
Calcium: 9.8 mg/dL (ref 8.6–10.4)
Chloride: 102 mmol/L (ref 98–110)
Creat: 1.07 mg/dL — ABNORMAL HIGH (ref 0.60–0.93)
Glucose, Bld: 87 mg/dL (ref 65–99)
Potassium: 4.6 mmol/L (ref 3.5–5.3)
Sodium: 144 mmol/L (ref 135–146)
Total Protein: 7.1 g/dL (ref 6.1–8.1)

## 2016-01-09 LAB — CBC WITH DIFFERENTIAL/PLATELET
BASOS PCT: 0 %
Basophils Absolute: 0 cells/uL (ref 0–200)
EOS ABS: 192 {cells}/uL (ref 15–500)
Eosinophils Relative: 2 %
HCT: 44.5 % (ref 35.0–45.0)
HEMOGLOBIN: 15 g/dL (ref 11.7–15.5)
LYMPHS PCT: 30 %
Lymphs Abs: 2880 cells/uL (ref 850–3900)
MCH: 28.5 pg (ref 27.0–33.0)
MCHC: 33.7 g/dL (ref 32.0–36.0)
MCV: 84.6 fL (ref 80.0–100.0)
MONO ABS: 576 {cells}/uL (ref 200–950)
MPV: 9.8 fL (ref 7.5–12.5)
Monocytes Relative: 6 %
NEUTROS PCT: 62 %
Neutro Abs: 5952 cells/uL (ref 1500–7800)
Platelets: 222 10*3/uL (ref 140–400)
RBC: 5.26 MIL/uL — AB (ref 3.80–5.10)
RDW: 14.5 % (ref 11.0–15.0)
WBC: 9.6 10*3/uL (ref 4.0–10.5)

## 2016-01-09 NOTE — Patient Instructions (Signed)
  Cristina Sawyer , Thank you for taking time to come for your Medicare Wellness Visit. I appreciate your ongoing commitment to your health goals. Please review the following plan we discussed and let me know if I can assist you in the future.   These are the goals we discussed: Continue to take good care of yourself  This is a list of the screening recommended for you and due dates:  Health Maintenance  Topic Date Due  . Shingles Vaccine  12/10/2000  . Pneumonia vaccines (2 of 2 - PPSV23) 01/18/2015  . Flu Shot  04/16/2016  . Tetanus Vaccine  07/13/2018  . Colon Cancer Screening  03/16/2019  . DEXA scan (bone density measurement)  Completed   Pneum 23 given

## 2016-01-09 NOTE — Progress Notes (Deleted)
Subjective:   HPI  Cristina Sawyer is a 75 y.o. female who presents for a complete physical.  Medical care team includes:  none   Preventative care: Last ophthalmology visit:11months ago Last dental visit: over a year Last colonoscopy: 03/15/14 Carlean Purl Last mammogram: 02/26/12 Last gynecological exam:2010 Last EKG:01/19/15 Last labs:01/17/14  Prior vaccinations: TD or Tdap:07/14/15 Influenza:07/04/15 Pneumococcal:23:2009 13:01/17/14 Shingles/Zostavax:none Other:   Advanced directive: Health care power of attorney: Living will:  Concerns:   Reviewed their medical, surgical, family, social, medication, and allergy history and updated chart as appropriate.    Review of Systems Constitutional: -fever, -chills, -sweats, -unexpected weight change, -decreased appetite, -fatigue Allergy: -sneezing, -itching, -congestion Dermatology: -changing moles, --rash, -lumps ENT: -runny nose, -ear pain, -sore throat, -hoarseness, -sinus pain, -teeth pain, - ringing in ears, -hearing loss, -nosebleeds Cardiology: -chest pain, -palpitations, -swelling, -difficulty breathing when lying flat, -waking up short of breath Respiratory: -cough, -shortness of breath, -difficulty breathing with exercise or exertion, -wheezing, -coughing up blood Gastroenterology: -abdominal pain, -nausea, -vomiting, -diarrhea, -constipation, -blood in stool, -changes in bowel movement, -difficulty swallowing or eating Hematology: -bleeding, -bruising  Musculoskeletal: -joint aches, -muscle aches, -joint swelling, -back pain, -neck pain, -cramping, -changes in gait Ophthalmology: denies vision changes, eye redness, itching, discharge Urology: -burning with urination, -difficulty urinating, -blood in urine, -urinary frequency, -urgency, -incontinence Neurology: -headache, -weakness, -tingling, -numbness, -memory loss, -falls, -dizziness Psychology: -depressed mood, -agitation, -sleep problems     Objective:   Physical  Exam  There were no vitals filed for this visit.  General appearance: alert, no distress, WD/WN,  Skin:  HEENT: normocephalic, conjunctiva/corneas normal, sclerae anicteric, PERRLA, EOMi, nares patent, no discharge or erythema, pharynx normal Oral cavity: MMM, tongue normal, teeth normal Neck: supple, no lymphadenopathy, no thyromegaly, no masses, normal ROM Chest: non tender, normal shape and expansion Heart: RRR, normal S1, S2, no murmurs Lungs: CTA bilaterally, no wheezes, rhonchi, or rales Abdomen: +bs, soft, non tender, non distended, no masses, no hepatomegaly, no splenomegaly, no bruits Back: non tender, normal ROM, no scoliosis Musculoskeletal: upper extremities non tender, no obvious deformity, normal ROM throughout, lower extremities non tender, no obvious deformity, normal ROM throughout Extremities: no edema, no cyanosis, no clubbing Pulses: 2+ symmetric, upper and lower extremities, normal cap refill Neurological: alert, oriented x 3, CN2-12 intact, strength normal upper extremities and lower extremities, sensation normal throughout, DTRs 2+ throughout, no cerebellar signs, gait normal Psychiatric: normal affect, behavior normal, pleasant  Breast: nontender, no masses or lumps, no skin changes, no nipple discharge or inversion, no axillary lymphadenopathy Gyn: Normal external genitalia without lesions, vagina with normal mucosa, cervix without lesions, no cervical motion tenderness, no abnormal vaginal discharge.  Uterus and adnexa not enlarged, nontender, no masses.  Pap performed.  Exam chaperoned by nurse. Rectal:     Assessment and Plan :       Physical exam - discussed healthy lifestyle, diet, exercise, preventative care, vaccinations, and addressed their concerns.  Handout given.    Follow-up

## 2016-01-09 NOTE — Progress Notes (Signed)
Subjective:     Patient ID: Cristina Sawyer, female   DOB: 04-19-1941, 75 y.o.   MRN: [id number]  HPI Cristina Sawyer presents to clinic today for her annual physical today.  Her only acute complaint today is anterior/ lateral right leg pain that is better with movement.  She noticed this pain at first when going to sleep on her right side and says the pain infrequently wakes her up at night.  She does not consider it much of a nuisance and stopped taking ibuprofen for her pain.  Her seasonal allergies are well controlled on zyrtec, nasal spray, and eye drops.  The zyrtec does sedate her somewhat, but she now takes this medication at ~5 PM so that it does not interfere with her regular sleep schedule.  Her GERD is well controlled on pantoprozaole that she takes 2-3 times / week.  She has also learned to avoid spicy foods and raw vegetables at dinner time to prevent triggering her symptoms. We discussed her hearing today and she has noticed that she has a harder time hearing the television than others in the room.  She also describes a ringing in her ears that she has noticed for the past 4-5 months, but it is not causing her any problems at the current time. Cristina Sawyer also describes a episodes where here fingers turn blue and white and go numb when she touches something cold like a drink.  She says this only happens around 3-4 times a year and she is careful to use gloves when it is cold outside.  She also has a history of bilateral cataracts and goes to see her opthamologist every year.  She is up to date on her cancer screening given her history of colonic adenomas.  She also needs a zostavax and pneumovax 23 today.    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  She is currently living by herself, but has extended family in the area that she sees regularly.  She is a non-smoker and drinks <1 glass of wine / day.  She describes a  balanced diet without processes or fast foods.  She says that she used to walk around 3 miles with her neighbor and will start doing this again soon.  She otherwise does not get much physical exercise.  She also expressed concern about her cancer risk because many of her friends around her age have been recently diagnosed with cancer.   Reviewed their medical, surgical, family, social, medication, and allergy history and updated chart as appropriate.  Medical care team includes:  none Preventative care:  Last ophthalmology visit:40months ago  Last dental visit: over a year  Last colonoscopy: 03/15/14 Carlean Purl  Last mammogram: 02/26/12  Last gynecological exam:2010  Last EKG:01/19/15  Last labs:01/17/14  Prior vaccinations:  TD or Tdap:07/14/15  Influenza:07/04/15  Pneumococcal:23:2009 13:01/17/14  Shingles/Zostavax:none  Advanced directive: yes Health care power of attorney: yes Living will: yes    Review of Systems  Constitutional: Negative for fever, fatigue and unexpected weight change.  HENT: Positive for hearing loss and tinnitus.   Eyes: Negative for pain and visual disturbance.  Respiratory: Negative for cough and shortness of breath.   Cardiovascular: Negative for chest pain and palpitations.  Gastrointestinal: Negative for nausea, vomiting, abdominal pain and diarrhea.  Endocrine: Negative for cold intolerance and heat intolerance.  Genitourinary: Negative for dysuria and frequency.  Musculoskeletal: Positive for myalgias.  Negative for arthralgias.  Neurological: Negative for syncope and weakness.       Objective:   Physical Exam Alert and in no distress.  Tympanic membranes and canals are normal.  Fundoscopic exam reveals normal vessels without hemorrhage.  Pharyngeal area is normal. Neck is supple without adenopathy or thyromegaly.  Cardiac exam shows a regular sinus rhythm without murmurs or gallops.  Lungs are clear to auscultation bilaterally without wheezes,  rales, rhonchi. On abdominal exam, non-tender to palpation in all quadrants, normal bowel sounds, and no HSM. Tender to palpation over trochanteric bursa and anterior right leg.    Assessment/Plan:     Encounter Diagnoses  Name Primary?  . Raynauds disease   . Seasonal allergic rhinitis due to pollen   . Need for prophylactic vaccination against Streptococcus pneumoniae (pneumococcus)   . Routine general medical examination at a health care facility Yes  . Gastroesophageal reflux disease with esophagitis    Leg pain Discussed how this may be trochanteric bursitis which we could give an injection for, but she was not interested in treatment at this time. She will contact us if her symptoms get worse.    Seasonal allergies Will continue current OTC zyrtec and discussed continuing to take it at 5-6 if that is helping her sleep with the sedative side effects.  Raynaud's disease Discussed continuing to avoid exposing her fingers to the cold and wearing gloves when necessary.  Hearing / tinnitus Since this is currently not a major concern for her, she will continue to monitor this and will consider treatment when it affects her daily activities.  GERD Will continue current regimen of pantoprazole 2-3 times /wk since she is not having symptoms on this regimen.  Cancer screening Discussed her colon and breast cancer screening.  She is currently 52 and would fall outside clear recommendations for both, but can continue since it is something she is worried about and is a very healthy 75 yo woman.  Health maintenance She was given a prescription for her shingles vaccine today.  In addition she was given her pneumococcal polysaccharide 23 vaccine today in office.  We also discussed getting more exercise and recommended working on getting a regular exercise schedule that includes at least 20 mins / day of moderate exercise.        History and physical exam conducted by Marylen Ponto (Medical  Student) in conjunction with Dr. Redmond School.

## 2016-04-22 ENCOUNTER — Ambulatory Visit (INDEPENDENT_AMBULATORY_CARE_PROVIDER_SITE_OTHER): Payer: PPO | Admitting: Family Medicine

## 2016-04-22 ENCOUNTER — Encounter: Payer: Self-pay | Admitting: Family Medicine

## 2016-04-22 VITALS — BP 150/86 | HR 64 | Temp 98.1°F | Wt 193.6 lb

## 2016-04-22 DIAGNOSIS — R030 Elevated blood-pressure reading, without diagnosis of hypertension: Secondary | ICD-10-CM | POA: Diagnosis not present

## 2016-04-22 DIAGNOSIS — R8290 Unspecified abnormal findings in urine: Secondary | ICD-10-CM | POA: Diagnosis not present

## 2016-04-22 DIAGNOSIS — R350 Frequency of micturition: Secondary | ICD-10-CM | POA: Diagnosis not present

## 2016-04-22 LAB — POCT URINALYSIS DIPSTICK
Bilirubin, UA: NEGATIVE
Glucose, UA: NEGATIVE
Ketones, UA: NEGATIVE
Leukocytes, UA: NEGATIVE
NITRITE UA: NEGATIVE
PROTEIN UA: NEGATIVE
RBC UA: NEGATIVE
SPEC GRAV UA: 1.02
UROBILINOGEN UA: NEGATIVE
pH, UA: 6.5

## 2016-04-22 MED ORDER — SULFAMETHOXAZOLE-TRIMETHOPRIM 800-160 MG PO TABS: 1.0000 | ORAL_TABLET | Freq: Two times a day (BID) | ORAL | 0 refills | Status: DC

## 2016-04-22 NOTE — Patient Instructions (Addendum)
Your blood pressure is elevated today 150/86. I recommend that you check your blood pressure at home as we discussed and if your are seeing readings >140/90 consistently then come back in about a month from now and have [place] recheck it.  Watch your diet, specifically your sodium intake and fried foods. Make sure you are getting at least 150 minutes of physical activity per week.   Make sure you are getting enough fluids and take the antibiotic. If you are not back to normal after completing the antibiotic then let us know. We are sending your urine for culture and will call you if you need to switch or stop the medication.    Urinary Tract Infection Urinary tract infections (UTIs) can develop anywhere along your urinary tract. Your urinary tract is your body's drainage system for removing wastes and extra water. Your urinary tract includes two kidneys, two ureters, a bladder, and a urethra. Your kidneys are a pair of bean-shaped organs. Each kidney is about the size of your fist. They are located below your ribs, one on each side of your spine. CAUSES Infections are caused by microbes, which are microscopic organisms, including fungi, viruses, and bacteria. These organisms are so small that they can only be seen through a microscope. Bacteria are the microbes that most commonly cause UTIs. SYMPTOMS  Symptoms of UTIs may vary by age and gender of the patient and by the location of the infection. Symptoms in young women typically include a frequent and intense urge to urinate and a painful, burning feeling in the bladder or urethra during urination. Older women and men are more likely to be tired, shaky, and weak and have muscle aches and abdominal pain. A fever may mean the infection is in your kidneys. Other symptoms of a kidney infection include pain in your back or sides below the ribs, nausea, and vomiting. DIAGNOSIS To diagnose a UTI, your caregiver will ask you about your symptoms. Your caregiver  will also ask you to provide a urine sample. The urine sample will be tested for bacteria and white blood cells. White blood cells are made by your body to help fight infection. TREATMENT  Typically, UTIs can be treated with medication. Because most UTIs are caused by a bacterial infection, they usually can be treated with the use of antibiotics. The choice of antibiotic and length of treatment depend on your symptoms and the type of bacteria causing your infection. HOME CARE INSTRUCTIONS  If you were prescribed antibiotics, take them exactly as your caregiver instructs you. Finish the medication even if you feel better after you have only taken some of the medication.  Drink enough water and fluids to keep your urine clear or pale yellow.  Avoid caffeine, tea, and carbonated beverages. They tend to irritate your bladder.  Empty your bladder often. Avoid holding urine for long periods of time.  Empty your bladder before and after sexual intercourse.  After a bowel movement, women should cleanse from front to back. Use each tissue only once. SEEK MEDICAL CARE IF:   You have back pain.  You develop a fever.  Your symptoms do not begin to resolve within 3 days. SEEK IMMEDIATE MEDICAL CARE IF:   You have severe back pain or lower abdominal pain.  You develop chills.  You have nausea or vomiting.  You have continued burning or discomfort with urination. MAKE SURE YOU:   Understand these instructions.  Will watch your condition.  Will get help right away if  you are not doing well or get worse.   This information is not intended to replace advice given to you by your health care provider. Make sure you discuss any questions you have with your health care provider.   Document Released: 06/12/2005 Document Revised: 05/24/2015 Document Reviewed: 10/11/2011 Elsevier Interactive Patient Education [employer].

## 2016-04-22 NOTE — Progress Notes (Signed)
Subjective:  Cristina Sawyer is a 75 y.o. female who complains of possible urinary tract infection.  She has had symptoms for 6 days.  Symptoms include pelvic pressure, urinary frequency and malodorous urine for past 6-7 days. Patient denies fever, chills, nausea, vomiting, back pain, diarrhea. .  Last UTI was over a year ago .   Using nothing for current symptoms.    Patient does not have a history of recurrent UTI. Patient does not have a history of pyelonephritis.  No other aggravating or relieving factors.  No other c/o.  Past Medical History:  Diagnosis Date  . Arthritis   . Cataract   . CREST syndrome ([place])    pt denies (saw rheum)  . Diverticulosis   . Dyslipidemia   . GERD (gastroesophageal reflux disease)   . Osteoporosis    OSTEOPENIA  . Personal history of colonic adenoma 08/09/2008  . Vitamin D deficiency     ROS as in subjective  Reviewed allergies, medications, past medical, surgical, and social history.    Objective: Vitals:   04/22/16 1532  BP: (!) 150/86  Pulse: 64  Temp: 98.1 F (36.7 C)    General appearance: alert, no distress, WD/WN, female  Cardiac exam shows a regular sinus rhythm without murmurs or gallops. Lungs are clear to auscultation. Abdomen: +bs, soft, non tender, non distended, no masses, no hepatomegaly, no splenomegaly, no bruits Back: no CVA tenderness GU: deferred.      Laboratory:  Urine dipstick: negative for all components.       Assessment: Frequency of urination - Plan: POCT urinalysis dipstick, Urine culture  Bad odor of urine - Plan: POCT urinalysis dipstick  Elevated blood pressure (not hypertension)    Plan: Discussed that since she feels like she has a UTI that I recommend she begin antibiotic and push fluids.  Begin medication Bactrim.  Advised she can use OTC Tylenol for pain.       Urine culture sent.   Discussed that her blood pressure is elevated today. Recommend that she check her blood pressure at home  and if she sees readings greater than 140/90 that she come back in one month and have [place] recheck her blood pressure. Discussed healthy lifestyle for managing blood pressure.

## 2016-04-24 LAB — URINE CULTURE

## 2016-06-04 ENCOUNTER — Encounter: Payer: Self-pay | Admitting: Family Medicine

## 2016-06-04 ENCOUNTER — Ambulatory Visit (INDEPENDENT_AMBULATORY_CARE_PROVIDER_SITE_OTHER): Payer: PPO | Admitting: Family Medicine

## 2016-06-04 VITALS — BP 138/78 | HR 76 | Temp 98.1°F | Ht 63.5 in | Wt 192.4 lb

## 2016-06-04 DIAGNOSIS — J302 Other seasonal allergic rhinitis: Secondary | ICD-10-CM

## 2016-06-04 DIAGNOSIS — J069 Acute upper respiratory infection, unspecified: Secondary | ICD-10-CM

## 2016-06-04 NOTE — Patient Instructions (Addendum)
  Drink plenty of water. Stop the coricidin and switch over to Zyrtec once daily. (versus claritin or allegra) You should also try guaifenesin--found in either robitussin products or Mucinex (I prefer the 12 hour mucinex).  You can buy the DM version if you are having significant cough (dextromethorphan is the cough suppressant) vs getting the plain kind, and if cough worsens, you can get a separate Delsym syrup (which is a 12 hour version of dextromethorphan.  Consider sinus rinses or netipot if you develop sinus congestion and pain.  Contact us or return if you develop recurrent fevers, discolored mucus or phlegm. At that point you may have developed a bacterial infection that needs antibiotics. There is no evidence of bacterial infection today.  I suspect that you had a virus (causing the fever, fatigue, malaise), but now may have some Fall allergies kicking in as well.

## 2016-06-04 NOTE — Progress Notes (Signed)
Chief Complaint  Patient presents with  . Cough    and runny nose, had ST that has resolved. Lots of mucus that is clear in color. Chills and fever. Symptoms started about 05/23/16-sat next to sick child on airplane. Having some SOB since arriving in [place] on 05/17/16.   Marland Kitchen Hand Pain    thinks she may have pulled something on her left hand about same time as her cold-told her you probably would not be able to address this as this was 15 minute appt for URI.    Flew to [place] 8/31, was okay for the first 3 days, then developed shortness of breath which she attributed to the elevation.  She came home on 9/6. On 9/7 she started with ST which later progressed to fever, chills, runny nose, congestion.  Tmax 101. She was taking coridicin, was sleeping a lot, drinking lots of fluids.   Fevers have resolved, none in 2-3 days. Continues to have head congestion--clear mucus. She has some cough at night. Phlegm is thick and clear. Headache and runny nose. Tightness in chest (very slight), mostly in the head, posteriorly.  +sick contacts at her sister's funeral on Thursday.  OTC meds--Coricidin  She has h/o Fall allergies; hasn't been taking zyrtec recently  PM[facility], [place], [place] reviewed  Outpatient Encounter Prescriptions as of 06/04/2016  Medication Sig Note  . Chlorpheniramine-DM (CORICIDIN HBP COUGH/COLD PO) Take 2 tablets by mouth every 4 (four) hours. 06/04/2016: Last dose Sunday night.  . cetirizine (ZYRTEC) 10 MG tablet Take 10 mg by mouth every evening.   . pantoprazole (PROTONIX) 40 MG tablet Take 1 tablet (40 mg total) by mouth daily. (Patient not taking: Reported on 06/04/2016)   . [DISCONTINUED] sulfamethoxazole-trimethoprim (BACTRIM DS,SEPTRA DS) 800-160 MG tablet Take 1 tablet by mouth 2 (two) times daily.    No facility-administered encounter medications on file as of 06/04/2016.    Allergies  Allergen Reactions  . Codeine     REACTION: itch/nausea  . Ciprofloxacin Hcl Rash   ROS: No vomiting.  She had slight diarrhea. Some nausea from the drainage, slight decreased appetite. +myalgias. Denies urinary complaints, bleeding, bruising, rashes. Slight chest congestion.  Denies chest pain with exertion. She is having some shortness of breath with exertion since illness.  Denies leg pain or swelling.  No h/o DVT or PE. Some pain at her left wrist developed when traveling, a swollen area at the wrist.   PHYSICAL EXAM: BP 138/78 (BP Location: Left Arm, Patient Position: Sitting, Cuff Size: Normal)   Pulse 76   Temp 98.1 F (36.7 C) (Tympanic)   Ht 5' 3.5" (1.613 m)   Wt 192 lb 6.4 oz (87.3 kg)   BMI 33.55 kg/m   Pleasant, well-appearing female in no distress. Frequent throat-clearing during visit, no cough HEENT: PERRL, EOMI, conjunctiva and sclera are clear. Nasal mucosa mild-mod edematous, with clear mucus Sinuses nontender, OP clear Neck: no lymphadenopathy, thyromegaly or mass Heart: regular rate and rhythm Lungs clear bilaterally Abdomen: soft, nontender, no mass Extremities: no edema. Slight focal area of swelling at palmar aspect of the left wrist, soft--only briefly examined as patient was leaving exam room  ASSESSMENT/PLAN:  Acute upper respiratory infection - supportive measures reviewed.  likely had URI, may also have some fall allergies  Seasonal allergies - restart oral once daily anti-histamine in place of coricidin    Focal area of wrist swelling--recommended ice, ibuprofen.  F/u if worse     Drink plenty of water. Stop the coricidin and  switch over to Zyrtec once daily. You should also try guaifenesin--found in either robitussin products or Mucinex (I prefer the 12 hour mucinex).  You can buy the DM version if you are having significant cough (dextromethorphan is the cough suppressant) vs getting the plain kind, and if cough worsens, you can get a separate Delsym syrup (which is a 12 hour version of dextromethorphan.  Consider sinus rinses or netipot if you  develop sinus congestion and pain.  Contact us or return if you develop recurrent fevers, discolored mucus or phlegm. At that point you may have developed a bacterial infection that needs antibiotics. There is no evidence of bacterial infection today.  I suspect that you had a virus (causing the fever, fatigue, malaise), but now may have some Fall allergies kicking in as well.

## 2016-07-02 ENCOUNTER — Encounter: Payer: Self-pay | Admitting: Family Medicine

## 2016-07-02 ENCOUNTER — Ambulatory Visit (INDEPENDENT_AMBULATORY_CARE_PROVIDER_SITE_OTHER): Payer: PPO | Admitting: Family Medicine

## 2016-07-02 VITALS — BP 130/72 | HR 77 | Wt 194.0 lb

## 2016-07-02 DIAGNOSIS — M6740 Ganglion, unspecified site: Secondary | ICD-10-CM | POA: Diagnosis not present

## 2016-07-02 DIAGNOSIS — Z23 Encounter for immunization: Secondary | ICD-10-CM

## 2016-07-02 NOTE — Patient Instructions (Signed)
Ganglion Cyst  A ganglion cyst is a noncancerous, fluid-filled lump that occurs near joints or tendons. The ganglion cyst grows out of a joint or the lining of a tendon. It most often develops in the hand or wrist, but it can also develop in the shoulder, elbow, hip, knee, ankle, or foot. The round or oval ganglion cyst can be the size of a pea or larger than a grape. Increased activity may enlarge the size of the cyst because more fluid starts to build up.   CAUSES  It is not known what causes a ganglion cyst to grow. However, it may be related to:  · Inflammation or irritation around the joint.  · An injury.  · Repetitive movements or overuse.  · Arthritis.  RISK FACTORS  Risk factors include:  · Being a woman.  · Being age 20-50.  SIGNS AND SYMPTOMS  Symptoms may include:   · A lump. This most often appears on the hand or wrist, but it can occur in other areas of the body.  · Tingling.  · Pain.  · Numbness.  · Muscle weakness.  · Weak grip.  · Less movement in a joint.  DIAGNOSIS  Ganglion cysts are most often diagnosed based on a physical exam. Your health care provider will feel the lump and may shine a light alongside it. If it is a ganglion cyst, a light often shines through it. Your health care provider may order an X-ray, ultrasound, or MRI to rule out other conditions.  TREATMENT  Ganglion cysts usually go away on their own without treatment. If pain or other symptoms are involved, treatment may be needed. Treatment is also needed if the ganglion cyst limits your movement or if it gets infected. Treatment may include:  · Wearing a brace or splint on your wrist or finger.  · Taking anti-inflammatory medicine.  · Draining fluid from the lump with a needle (aspiration).  · Injecting a steroid into the joint.  · Surgery to remove the ganglion cyst.  HOME CARE INSTRUCTIONS  · Do not press on the ganglion cyst, poke it with a needle, or hit it.  · Take medicines only as directed by your health care  provider.  · Wear your brace or splint as directed by your health care provider.  · Watch your ganglion cyst for any changes.  · Keep all follow-up visits as directed by your health care provider. This is important.  SEEK MEDICAL CARE IF:  · Your ganglion cyst becomes larger or more painful.  · You have increased redness, red streaks, or swelling.  · You have pus coming from the lump.  · You have weakness or numbness in the affected area.  · You have a fever or chills.     This information is not intended to replace advice given to you by your health care provider. Make sure you discuss any questions you have with your health care provider.     Document Released: 08/30/2000 Document Revised: 09/23/2014 Document Reviewed: 02/15/2014  Elsevier Interactive Patient Education ©2016 Elsevier Inc[employer]

## 2016-07-02 NOTE — Progress Notes (Signed)
   Subjective:    Patient ID: Cristina Sawyer, female    DOB: 05/01/1941, 75 y.o.   MRN: [id number]  HPI She complains of pain in her left wrist as well as swelling that she noted approximately one week ago.   Review of Systems     Objective:   Physical Exam Exam of the left wrist on the palmar surface does show a 2 cm round smooth movable and slightly tender lesion present near the radial head.       Assessment & Plan:  Ganglion cyst - Plan: Ambulatory referral to Orthopedic Surgery  Need for prophylactic vaccination and inoculation against influenza - Plan: Flu vaccine HIGH DOSE PF (Fluzone High dose)  Since she is having difficulty with pain, I will refer her to orthopedics. Will also give flu shot today.

## 2016-10-07 DIAGNOSIS — M67432 Ganglion, left wrist: Secondary | ICD-10-CM | POA: Diagnosis not present

## 2016-10-23 ENCOUNTER — Other Ambulatory Visit: Payer: Self-pay

## 2016-10-23 DIAGNOSIS — M67432 Ganglion, left wrist: Secondary | ICD-10-CM | POA: Diagnosis not present

## 2016-11-05 DIAGNOSIS — Z4789 Encounter for other orthopedic aftercare: Secondary | ICD-10-CM | POA: Diagnosis not present

## 2016-11-05 DIAGNOSIS — M67432 Ganglion, left wrist: Secondary | ICD-10-CM | POA: Diagnosis not present

## 2017-02-03 DIAGNOSIS — E669 Obesity, unspecified: Secondary | ICD-10-CM | POA: Diagnosis not present

## 2017-02-03 DIAGNOSIS — H9313 Tinnitus, bilateral: Secondary | ICD-10-CM | POA: Diagnosis not present

## 2017-02-03 DIAGNOSIS — G4700 Insomnia, unspecified: Secondary | ICD-10-CM | POA: Diagnosis not present

## 2017-02-03 DIAGNOSIS — Z6833 Body mass index (BMI) 33.0-33.9, adult: Secondary | ICD-10-CM | POA: Diagnosis not present

## 2017-02-03 DIAGNOSIS — Z0000 Encounter for general adult medical examination without abnormal findings: Secondary | ICD-10-CM | POA: Diagnosis not present

## 2017-02-03 DIAGNOSIS — M130 Polyarthritis, unspecified: Secondary | ICD-10-CM | POA: Diagnosis not present

## 2017-02-03 DIAGNOSIS — G589 Mononeuropathy, unspecified: Secondary | ICD-10-CM | POA: Diagnosis not present

## 2017-02-03 DIAGNOSIS — J301 Allergic rhinitis due to pollen: Secondary | ICD-10-CM | POA: Diagnosis not present

## 2017-02-03 DIAGNOSIS — K219 Gastro-esophageal reflux disease without esophagitis: Secondary | ICD-10-CM | POA: Diagnosis not present

## 2017-02-04 ENCOUNTER — Encounter: Payer: Self-pay | Admitting: Family Medicine

## 2017-02-04 ENCOUNTER — Ambulatory Visit (INDEPENDENT_AMBULATORY_CARE_PROVIDER_SITE_OTHER): Payer: Medicare HMO | Admitting: Family Medicine

## 2017-02-04 VITALS — BP 140/90 | Wt 181.0 lb

## 2017-02-04 DIAGNOSIS — F4320 Adjustment disorder, unspecified: Secondary | ICD-10-CM | POA: Diagnosis not present

## 2017-02-04 DIAGNOSIS — Z6379 Other stressful life events affecting family and household: Secondary | ICD-10-CM | POA: Diagnosis not present

## 2017-02-04 DIAGNOSIS — F4321 Adjustment disorder with depressed mood: Secondary | ICD-10-CM

## 2017-02-04 DIAGNOSIS — R69 Illness, unspecified: Secondary | ICD-10-CM | POA: Diagnosis not present

## 2017-02-04 MED ORDER — ZOLPIDEM TARTRATE 5 MG PO TABS: 5.0000 mg | ORAL_TABLET | Freq: Every evening | ORAL | 0 refills | Status: DC | PRN

## 2017-02-04 NOTE — Progress Notes (Signed)
   Subjective:    Patient ID: Aggie Moats, female    DOB: Aug 10, 1941, 76 y.o.   MRN: [id number]  HPI She has been under a lot of stress dealing with family members. Recently her sister was placed in the hospital and apparently is slowly recuperating. Her brother recently died. She will need to be going to [facility] for the funeral services. She has had some difficulty with headaches as well as fatigue and sleep disturbance.   Review of Systems     Objective:   Physical Exam Alert and in no distress otherwise not examined. Appropriate affect noted.       Assessment & Plan:  Grief reaction - Plan: zolpidem (AMBIEN) 5 MG tablet  Stress due to illness of family member I discussed the stress/grief that she is under and reinforced the fact that her response to this is entirely normal. Discussed death and dying with her in regard to denial, anger , bargaining, depression and finally acceptance.

## 2017-02-07 ENCOUNTER — Telehealth: Payer: Self-pay

## 2017-02-07 NOTE — Telephone Encounter (Signed)
Pt aware PA approved. Approval faxed to

## 2017-02-07 NOTE — Telephone Encounter (Signed)
PA initiated through covermymeds 

## 2017-03-13 DIAGNOSIS — H5213 Myopia, bilateral: Secondary | ICD-10-CM | POA: Diagnosis not present

## 2017-03-13 DIAGNOSIS — H524 Presbyopia: Secondary | ICD-10-CM | POA: Diagnosis not present

## 2017-03-13 DIAGNOSIS — H52203 Unspecified astigmatism, bilateral: Secondary | ICD-10-CM | POA: Diagnosis not present

## 2017-04-01 ENCOUNTER — Ambulatory Visit (INDEPENDENT_AMBULATORY_CARE_PROVIDER_SITE_OTHER): Payer: Medicare HMO | Admitting: Family Medicine

## 2017-04-01 ENCOUNTER — Encounter: Payer: Self-pay | Admitting: Family Medicine

## 2017-04-01 VITALS — BP 140/80 | HR 75 | Ht 64.0 in | Wt 190.0 lb

## 2017-04-01 DIAGNOSIS — K2100 Gastro-esophageal reflux disease with esophagitis, without bleeding: Secondary | ICD-10-CM

## 2017-04-01 DIAGNOSIS — J301 Allergic rhinitis due to pollen: Secondary | ICD-10-CM | POA: Diagnosis not present

## 2017-04-01 DIAGNOSIS — L80 Vitiligo: Secondary | ICD-10-CM

## 2017-04-01 DIAGNOSIS — Z86010 Personal history of colonic polyps: Secondary | ICD-10-CM | POA: Diagnosis not present

## 2017-04-01 DIAGNOSIS — R69 Illness, unspecified: Secondary | ICD-10-CM | POA: Diagnosis not present

## 2017-04-01 DIAGNOSIS — F4321 Adjustment disorder with depressed mood: Secondary | ICD-10-CM

## 2017-04-01 DIAGNOSIS — F4320 Adjustment disorder, unspecified: Secondary | ICD-10-CM

## 2017-04-01 LAB — CBC WITH DIFFERENTIAL/PLATELET
BASOS PCT: 0 %
Basophils Absolute: 0 cells/uL (ref 0–200)
EOS ABS: 174 {cells}/uL (ref 15–500)
Eosinophils Relative: 2 %
HCT: 45 % (ref 35.0–45.0)
Hemoglobin: 15 g/dL (ref 11.7–15.5)
LYMPHS PCT: 29 %
Lymphs Abs: 2523 cells/uL (ref 850–3900)
MCH: 28.4 pg (ref 27.0–33.0)
MCHC: 33.3 g/dL (ref 32.0–36.0)
MCV: 85.2 fL (ref 80.0–100.0)
MONO ABS: 435 {cells}/uL (ref 200–950)
MONOS PCT: 5 %
MPV: 11 fL (ref 7.5–12.5)
NEUTROS ABS: 5568 {cells}/uL (ref 1500–7800)
Neutrophils Relative %: 64 %
PLATELETS: 241 10*3/uL (ref 140–400)
RBC: 5.28 MIL/uL — AB (ref 3.80–5.10)
RDW: 14.7 % (ref 11.0–15.0)
WBC: 8.7 10*3/uL (ref 4.0–10.5)

## 2017-04-01 LAB — COMPREHENSIVE METABOLIC PANEL
ALK PHOS: 84 U/L (ref 33–130)
ALT: 14 U/L (ref 6–29)
AST: 16 U/L (ref 10–35)
Albumin: 4.4 g/dL (ref 3.6–5.1)
BILIRUBIN TOTAL: 0.6 mg/dL (ref 0.2–1.2)
BUN: 16 mg/dL (ref 7–25)
CO2: 27 mmol/L (ref 20–31)
CREATININE: 1.05 mg/dL — AB (ref 0.60–0.93)
Calcium: 10 mg/dL (ref 8.6–10.4)
Chloride: 104 mmol/L (ref 98–110)
GLUCOSE: 88 mg/dL (ref 65–99)
Potassium: 4.4 mmol/L (ref 3.5–5.3)
Sodium: 141 mmol/L (ref 135–146)
TOTAL PROTEIN: 7.1 g/dL (ref 6.1–8.1)

## 2017-04-01 NOTE — Progress Notes (Signed)
Subjective:   HPI  Cristina Sawyer is a 76 y.o. female who presents for Chief Complaint  Patient presents with  . Medicare Wellness    med check plus    Medical care team includes: Denita Lung, MD here for primary care    Preventative care: [employer] Last ophthalmology visit: 03/2017 Last dental visit: 1 year Last colonoscopy:03/15/14 Last mammogram: 02/26/12 Last pap: N/A Last EKG:01/19/15 Last labs:01/17/14  Prior vaccinations:  TD or Tdap: 07/13/08 Influenza: 07/02/16 Pneumococcal:23: 01/09/16,07/13/08 13:01/17/14 Shingles/Zostavax:Shingrix prescription written.   Advanced directive: No. Information given. Health care power of attorney: Discussed with patient   Concerns: Her main complaint today is a dry cough that she's had for the last 4 weeks. She does note this mainly at night but cannot relate this to any particular foods. No fever, chills, productive cough, earache or sore throat She does have underlying allergies and has been using Zyrtec. She does occasionally use  Zantac for her reflux type symptoms. She also is concerned over skin changes on her hands. She recently had the death of 2 of her siblings have occurred quite close to each other and she still grieving from this. She has had a colonoscopy and does have evidence of colonic polyps. She will be scheduled again in 2020.  Reviewed their medical, surgical, family, social, medication, and allergy history and updated chart as appropriate.  Past Medical History:  Diagnosis Date  . Arthritis   . Cataract   . CREST syndrome ([place])    pt denies (saw rheum)  . Diverticulosis   . Dyslipidemia   . GERD (gastroesophageal reflux disease)   . Osteoporosis    OSTEOPENIA  . Personal history of colonic adenoma 08/09/2008  . Vitamin D deficiency     Past Surgical History:  Procedure Laterality Date  . ABDOMINAL HYSTERECTOMY  1975  . APPENDECTOMY  1975  . COLONOSCOPY    . thyroid nodule  2000    Social History    Social History  . Marital status: Widowed    Spouse name: N/A  . Number of children: N/A  . Years of education: N/A   Occupational History  . Not on file.   Social History Main Topics  . Smoking status: Never Smoker  . Smokeless tobacco: Never Used  . Alcohol use No  . Drug use: No  . Sexual activity: Yes   Other Topics Concern  . Not on file   Social History Narrative  . No narrative on file    Family History  Problem Relation Age of Onset  . Colon cancer Neg Hx      Current Outpatient Prescriptions:  .  cetirizine (ZYRTEC) 10 MG tablet, Take 10 mg by mouth every evening., Disp: , Rfl:  .  ranitidine (ZANTAC) 75 MG tablet, Take 75 mg by mouth 2 (two) times daily., Disp: , Rfl:  .  zolpidem (AMBIEN) 5 MG tablet, Take 1 tablet (5 mg total) by mouth at bedtime as needed for sleep. (Patient not taking: Reported on 04/01/2017), Disp: 12 tablet, Rfl: 0  Allergies  Allergen Reactions  . Codeine     REACTION: itch/nausea  . Ciprofloxacin Hcl Rash       Review of Systems Negative except as above Objective:   Alert and in no distress. Tympanic membranes and canals are normal. Pharyngeal area is normal. Neck is supple without adenopathy or thyromegaly. Cardiac exam shows a regular sinus rhythm without murmurs or gallops. Lungs are clear to auscultation. Abdominal exam shows  no masses or tenderness. Exam of her hands does show some hypopigmentation over the dorsal surface.  Assessment and Plan :    Vitiligo - Plan: CBC with Differential/Platelet, Comprehensive metabolic panel, Ambulatory referral to Dermatology  Grief reaction  Seasonal allergic rhinitis due to pollen  Gastroesophageal reflux disease with esophagitis - Plan: CBC with Differential/Platelet, Comprehensive metabolic panel  Personal history of colonic adenoma - Plan: CBC with Differential/Platelet, Comprehensive metabolic panel Discussed grief reaction with her explained that the symptoms she is  going to a quite normal. It's in the possibility of going for bereavement counseling. She will continue on her allergy medications. Recommend she take Zantac regularly and keep track of her chronic cough symptoms

## 2017-05-02 ENCOUNTER — Encounter (HOSPITAL_COMMUNITY): Payer: Self-pay | Admitting: *Deleted

## 2017-05-02 ENCOUNTER — Emergency Department (HOSPITAL_COMMUNITY): Payer: Medicare HMO

## 2017-05-02 ENCOUNTER — Observation Stay (HOSPITAL_COMMUNITY)
Admission: EM | Admit: 2017-05-02 | Discharge: 2017-05-05 | Disposition: A | Discharge status: Home / Self Care | Payer: Medicare HMO | Attending: Family Medicine | Admitting: Family Medicine

## 2017-05-02 ENCOUNTER — Observation Stay (HOSPITAL_COMMUNITY): Payer: Medicare HMO

## 2017-05-02 ENCOUNTER — Telehealth: Payer: Self-pay | Admitting: Family Medicine

## 2017-05-02 DIAGNOSIS — I10 Essential (primary) hypertension: Secondary | ICD-10-CM | POA: Diagnosis not present

## 2017-05-02 DIAGNOSIS — Z885 Allergy status to narcotic agent status: Secondary | ICD-10-CM | POA: Diagnosis not present

## 2017-05-02 DIAGNOSIS — E785 Hyperlipidemia, unspecified: Secondary | ICD-10-CM | POA: Insufficient documentation

## 2017-05-02 DIAGNOSIS — G44209 Tension-type headache, unspecified, not intractable: Secondary | ICD-10-CM | POA: Diagnosis not present

## 2017-05-02 DIAGNOSIS — R4701 Aphasia: Principal | ICD-10-CM | POA: Insufficient documentation

## 2017-05-02 DIAGNOSIS — R519 Headache, unspecified: Secondary | ICD-10-CM

## 2017-05-02 DIAGNOSIS — Z79899 Other long term (current) drug therapy: Secondary | ICD-10-CM | POA: Insufficient documentation

## 2017-05-02 DIAGNOSIS — R51 Headache: Secondary | ICD-10-CM | POA: Insufficient documentation

## 2017-05-02 DIAGNOSIS — M341 CR(E)ST syndrome: Secondary | ICD-10-CM | POA: Diagnosis not present

## 2017-05-02 DIAGNOSIS — R4789 Other speech disturbances: Secondary | ICD-10-CM

## 2017-05-02 DIAGNOSIS — Z90710 Acquired absence of both cervix and uterus: Secondary | ICD-10-CM | POA: Insufficient documentation

## 2017-05-02 DIAGNOSIS — J309 Allergic rhinitis, unspecified: Secondary | ICD-10-CM | POA: Insufficient documentation

## 2017-05-02 DIAGNOSIS — Z881 Allergy status to other antibiotic agents status: Secondary | ICD-10-CM | POA: Insufficient documentation

## 2017-05-02 DIAGNOSIS — K5900 Constipation, unspecified: Secondary | ICD-10-CM | POA: Insufficient documentation

## 2017-05-02 DIAGNOSIS — K219 Gastro-esophageal reflux disease without esophagitis: Secondary | ICD-10-CM | POA: Diagnosis not present

## 2017-05-02 DIAGNOSIS — Z9049 Acquired absence of other specified parts of digestive tract: Secondary | ICD-10-CM | POA: Insufficient documentation

## 2017-05-02 DIAGNOSIS — G4700 Insomnia, unspecified: Secondary | ICD-10-CM | POA: Insufficient documentation

## 2017-05-02 LAB — COMPREHENSIVE METABOLIC PANEL
ALK PHOS: 79 U/L (ref 38–126)
ALT: 15 U/L (ref 14–54)
AST: 19 U/L (ref 15–41)
Albumin: 4.2 g/dL (ref 3.5–5.0)
Anion gap: 10 (ref 5–15)
BILIRUBIN TOTAL: 0.8 mg/dL (ref 0.3–1.2)
BUN: 13 mg/dL (ref 6–20)
CALCIUM: 9.3 mg/dL (ref 8.9–10.3)
CO2: 25 mmol/L (ref 22–32)
CREATININE: 1.02 mg/dL — AB (ref 0.44–1.00)
Chloride: 107 mmol/L (ref 101–111)
GFR calc Af Amer: >60 mL/min (ref >60)
GFR, EST NON AFRICAN AMERICAN: 52 mL/min — AB (ref >60)
GLUCOSE: 105 mg/dL — AB (ref 65–99)
Potassium: 3.8 mmol/L (ref 3.5–5.1)
Sodium: 142 mmol/L (ref 135–145)
TOTAL PROTEIN: 7.4 g/dL (ref 6.5–8.1)

## 2017-05-02 LAB — I-STAT CHEM 8, ED
BUN: 13 mg/dL (ref 6–20)
CREATININE: 1.1 mg/dL — AB (ref 0.44–1.00)
Calcium, Ion: 1.11 mmol/L — ABNORMAL LOW (ref 1.15–1.40)
Chloride: 105 mmol/L (ref 101–111)
GLUCOSE: 104 mg/dL — AB (ref 65–99)
HCT: 45 % (ref 36.0–46.0)
HEMOGLOBIN: 15.3 g/dL — AB (ref 12.0–15.0)
Potassium: 3.8 mmol/L (ref 3.5–5.1)
Sodium: 141 mmol/L (ref 135–145)
TCO2: 27 mmol/L (ref 0–100)

## 2017-05-02 LAB — CBC
HEMATOCRIT: 43.1 % (ref 36.0–46.0)
Hemoglobin: 14.8 g/dL (ref 12.0–15.0)
MCH: 29 pg (ref 26.0–34.0)
MCHC: 34.3 g/dL (ref 30.0–36.0)
MCV: 84.3 fL (ref 78.0–100.0)
Platelets: 203 10*3/uL (ref 150–400)
RBC: 5.11 MIL/uL (ref 3.87–5.11)
RDW: 14.4 % (ref 11.5–15.5)
WBC: 9.9 10*3/uL (ref 4.0–10.5)

## 2017-05-02 LAB — DIFFERENTIAL
BASOS ABS: 0 10*3/uL (ref 0.0–0.1)
Basophils Relative: 0 %
EOS ABS: 0.2 10*3/uL (ref 0.0–0.7)
EOS PCT: 2 %
LYMPHS ABS: 3.1 10*3/uL (ref 0.7–4.0)
LYMPHS PCT: 32 %
MONOS PCT: 4 %
Monocytes Absolute: 0.4 10*3/uL (ref 0.1–1.0)
Neutro Abs: 6.2 10*3/uL (ref 1.7–7.7)
Neutrophils Relative %: 62 %

## 2017-05-02 LAB — PROTIME-INR
INR: 0.97
Prothrombin Time: 12.9 seconds (ref 11.4–15.2)

## 2017-05-02 LAB — I-STAT TROPONIN, ED: TROPONIN I, POC: 0.01 ng/mL (ref 0.00–0.08)

## 2017-05-02 LAB — APTT: aPTT: 33 seconds (ref 24–36)

## 2017-05-02 LAB — ETHANOL: Alcohol, Ethyl (B): <5 mg/dL (ref <5)

## 2017-05-02 MED ORDER — ONDANSETRON HCL 4 MG/2ML IJ SOLN: 4.0000 mg | Freq: Four times a day (QID) | INTRAMUSCULAR | Status: DC | PRN

## 2017-05-02 MED ORDER — KETOROLAC TROMETHAMINE 30 MG/ML IJ SOLN
30.0000 mg | Freq: Once | INTRAMUSCULAR | Status: AC
  Administered 2017-05-02: 30 mg via INTRAVENOUS
  Filled 2017-05-02: qty 1

## 2017-05-02 MED ORDER — LORATADINE 10 MG PO TABS
10.0000 mg | ORAL_TABLET | Freq: Once | ORAL | Status: AC
  Administered 2017-05-02: 10 mg via ORAL
  Filled 2017-05-02: qty 1

## 2017-05-02 MED ORDER — METOCLOPRAMIDE HCL 5 MG/ML IJ SOLN
10.0000 mg | Freq: Once | INTRAMUSCULAR | Status: AC
  Administered 2017-05-02: 10 mg via INTRAVENOUS
  Filled 2017-05-02: qty 2

## 2017-05-02 MED ORDER — ACETAMINOPHEN 650 MG RE SUPP: 650.0000 mg | Freq: Four times a day (QID) | RECTAL | Status: DC | PRN

## 2017-05-02 MED ORDER — ONDANSETRON HCL 4 MG PO TABS: 4.0000 mg | ORAL_TABLET | Freq: Four times a day (QID) | ORAL | Status: DC | PRN

## 2017-05-02 MED ORDER — IBUPROFEN 200 MG PO TABS
400.0000 mg | ORAL_TABLET | Freq: Four times a day (QID) | ORAL | Status: DC | PRN
  Administered 2017-05-02 – 2017-05-03 (×2): 400 mg via ORAL
  Filled 2017-05-02 (×2): qty 2

## 2017-05-02 MED ORDER — KETOROLAC TROMETHAMINE 15 MG/ML IJ SOLN
15.0000 mg | Freq: Four times a day (QID) | INTRAMUSCULAR | Status: DC | PRN
  Administered 2017-05-02 – 2017-05-04 (×3): 15 mg via INTRAVENOUS
  Filled 2017-05-02 (×3): qty 1

## 2017-05-02 MED ORDER — ACETAMINOPHEN 325 MG PO TABS: 650.0000 mg | ORAL_TABLET | Freq: Four times a day (QID) | ORAL | Status: DC | PRN

## 2017-05-02 MED ORDER — ENOXAPARIN SODIUM 40 MG/0.4ML ~~LOC~~ SOLN
40.0000 mg | SUBCUTANEOUS | Status: DC
  Administered 2017-05-02 – 2017-05-04 (×3): 40 mg via SUBCUTANEOUS
  Filled 2017-05-02 (×3): qty 0.4

## 2017-05-02 MED ORDER — FAMOTIDINE 20 MG PO TABS: 20.0000 mg | ORAL_TABLET | Freq: Every day | ORAL | Status: DC | PRN

## 2017-05-02 MED ORDER — FLUTICASONE PROPIONATE 50 MCG/ACT NA SUSP
2.0000 | Freq: Every day | NASAL | Status: DC | PRN
  Administered 2017-05-03: 2 via NASAL
  Filled 2017-05-02: qty 16

## 2017-05-02 NOTE — H&P (Signed)
History and Physical    LENELL MCCONNELL TWS:[id number] DOB: 06-24-1941 DOA: 05/02/2017  PCP: Denita Lung, MD  Patient coming from: Home  Chief Complaint: Headache  HPI: Cristina Sawyer is a 76 y.o. female with medical history significant of CREST syndrome, HLD, GERD, and osteopenia who presented to the emergency department due to complaint of headache and new onset speech difficulty. She states that her headache started at her neck and radiated upward. It started yesterday and has progressively gotten worse. It got to be very severe today and she had a few minutes of word-finding difficulties, which prompted her to seek care. She had some blurry vision but no loss of vision, no chest pain or shortness of breath. She deals with allergies and has some congestion as well as dry cough. Admits to nausea and constipation but no vomiting. No dysuria. Her headache still persists but word finding difficulties have resolved.   ED Course: CT head completed, did not show any acute pathology. EDP spoke with Dr. Londell Moh with neurology. He recommended MRI given TIA symptom as well as new-onset headache. Differential diagnosis includes complex migraine versus TIA. Word finding difficulty and aphasia has resolved.  Review of Systems: As per HPI otherwise 10 point review of systems negative.   Past Medical History:  Diagnosis Date  . Arthritis   . Cataract   . CREST syndrome ([place])    pt denies (saw rheum)  . Diverticulosis   . Dyslipidemia   . GERD (gastroesophageal reflux disease)   . Osteoporosis    OSTEOPENIA  . Personal history of colonic adenoma 08/09/2008  . Vitamin D deficiency     Past Surgical History:  Procedure Laterality Date  . ABDOMINAL HYSTERECTOMY  1975  . APPENDECTOMY  1975  . COLONOSCOPY    . thyroid nodule  2000     reports that she has never smoked. She has never used smokeless tobacco. She reports that she does not drink alcohol or use drugs.  Allergies  Allergen  Reactions  . Codeine     REACTION: itch/nausea  . Ciprofloxacin Hcl Rash    Family History  Problem Relation Age of Onset  . Colon cancer Neg Hx    Prior to Admission medications   Medication Sig Start Date End Date Taking? Authorizing Provider  Chlorphen-Phenyleph-APAP (CORICIDIN D COLD/FLU/SINUS) 2-5-325 MG TABS Take 1 tablet by mouth daily as needed (allergies).   Yes [provider]  cetirizine (ZYRTEC) 10 MG tablet Take 10 mg by mouth daily as needed for allergies.     [provider]  ranitidine (ZANTAC) 75 MG tablet Take 75 mg by mouth 2 (two) times daily as needed for heartburn.     [provider]  zolpidem (AMBIEN) 5 MG tablet Take 1 tablet (5 mg total) by mouth at bedtime as needed for sleep. Patient not taking: Reported on 04/01/2017 02/04/17   Denita Lung, MD    Physical Exam: Vitals:   05/02/17 1438 05/02/17 1714  BP:  (!) 129/99  Pulse:  65  Resp:  13  Temp:  98.3 F (36.8 C)  TempSrc:  Oral  SpO2:  100%  Weight: 83.9 kg (185 lb)   Height: 5\' 4"  (1.626 m)      Constitutional: NAD, calm, comfortable Eyes: PERRL, lids and conjunctivae normal ENMT: Mucous membranes are moist. Posterior pharynx clear of any exudate or lesions.Normal dentition.  Neck: normal, supple, no masses, no thyromegaly Respiratory: clear to auscultation bilaterally, no wheezing, no crackles. Normal  respiratory effort. No accessory muscle use.  Cardiovascular: Regular rate and rhythm, no murmurs / rubs / gallops. No extremity edema. 2 Abdomen: no tenderness, no masses palpated. No hepatosplenomegaly. Bowel sounds positive.  Musculoskeletal: no clubbing / cyanosis. No joint deformity upper and lower extremities. Good ROM, no contractures. Normal muscle tone.  Skin: no rashes, lesions, ulcers. No induration Neurologic: CN 2-12 grossly intact. Strength 5/5 in all 4. Finger-nose symmetric. Nonfocal exam and speech fluent.  Psychiatric: Normal judgment and insight.  Alert and oriented x 3. Normal mood.   Labs on Admission: I have personally reviewed following labs and imaging studies  CBC:  Recent Labs Lab 05/02/17 1508 05/02/17 1521  WBC 9.9  --   NEUTROABS 6.2  --   HGB 14.8 15.3*  HCT 43.1 45.0  MCV 84.3  --   PLT 203  --    Basic Metabolic Panel:  Recent Labs Lab 05/02/17 1508 05/02/17 1521  NA 142 141  K 3.8 3.8  CL 107 105  CO2 25  --   GLUCOSE 105* 104*  BUN 13 13  CREATININE 1.02* 1.10*  CALCIUM 9.3  --    GFR: Estimated Creatinine Clearance: 45.6 mL/min (A) (by C-G formula based on SCr of 1.1 mg/dL (H)). Liver Function Tests:  Recent Labs Lab 05/02/17 1508  AST 19  ALT 15  ALKPHOS 79  BILITOT 0.8  PROT 7.4  ALBUMIN 4.2   No results for input(s): LIPASE, AMYLASE in the last 168 hours. No results for input(s): AMMONIA in the last 168 hours. Coagulation Profile:  Recent Labs Lab 05/02/17 1508  INR 0.97   Cardiac Enzymes: No results for input(s): CKTOTAL, CKMB, CKMBINDEX, TROPONINI in the last 168 hours. BNP (last 3 results) No results for input(s): PROBNP in the last 8760 hours. HbA1C: No results for input(s): HGBA1C in the last 72 hours. CBG: No results for input(s): GLUCAP in the last 168 hours. Lipid Profile: No results for input(s): CHOL, HDL, LDLCALC, TRIG, CHOLHDL, LDLDIRECT in the last 72 hours. Thyroid Function Tests: No results for input(s): TSH, T4TOTAL, FREET4, T3FREE, THYROIDAB in the last 72 hours. Anemia Panel: No results for input(s): VITAMINB12, FOLATE, FERRITIN, TIBC, IRON, RETICCTPCT in the last 72 hours. Urine analysis:    Component Value Date/Time   COLORURINE STRAW (A) 03/04/2011 1342   APPEARANCEUR CLEAR 03/04/2011 1342   LABSPEC 1.005 03/04/2011 1342   PHURINE 6.0 03/04/2011 1342   GLUCOSEU NEGATIVE 03/04/2011 1342   HGBUR NEGATIVE 03/04/2011 1342   BILIRUBINUR n 04/22/2016 1558   KETONESUR NEGATIVE 03/04/2011 1342   PROTEINUR n 04/22/2016 1558   PROTEINUR NEGATIVE  03/04/2011 1342   UROBILINOGEN negative 04/22/2016 1558   UROBILINOGEN 0.2 03/04/2011 1342   NITRITE n 04/22/2016 1558   NITRITE NEGATIVE 03/04/2011 1342   LEUKOCYTESUR Negative 04/22/2016 1558   Sepsis Labs: !!!!!!!!!!!!!!!!!!!!!!!!!!!!!!!!!!!!!!!!!!!! @LABRCNTIP (procalcitonin:4,lacticidven:4) )No results found for this or any previous visit (from the past 240 hour(s)).   Radiological Exams on Admission: Ct Head Wo Contrast  Result Date: 05/02/2017 CLINICAL DATA:  Alert and oriented.  Headache. EXAM: CT HEAD WITHOUT CONTRAST TECHNIQUE: Contiguous axial images were obtained from the base of the skull through the vertex without intravenous contrast. COMPARISON:  05/18/2008 FINDINGS: Brain: No evidence of acute infarction, hemorrhage, hydrocephalus, extra-axial collection or mass lesion/mass effect. Vascular: No hyperdense vessel or unexpected calcification. Skull: No osseous abnormality. Sinuses/Orbits: Visualized paranasal sinuses are clear. Visualized mastoid sinuses are clear. Visualized orbits demonstrate no focal abnormality. Other: None IMPRESSION: 1. No acute intracranial pathology.  Electronically Signed   By: Kathreen Devoid   On: 05/02/2017 15:48   Mr Brain Wo Contrast  Result Date: 05/02/2017 CLINICAL DATA:  Thunderclap headache EXAM: MRI HEAD WITHOUT CONTRAST TECHNIQUE: Multiplanar, multiecho pulse sequences of the brain and surrounding structures were obtained without intravenous contrast. COMPARISON:  Head CT 05/02/2017 Brain MRI 05/19/2008 FINDINGS: Brain: Partially empty sella. There is no focal diffusion restriction to indicate acute infarct. The brain parenchymal signal is normal and there is no mass lesion. No intraparenchymal hematoma or chronic microhemorrhage. Brain volume is normal for age without lobar predominant atrophy. The dura is normal and there is no extra-axial collection. Vascular: Major intracranial arterial and venous sinus flow voids are preserved. Skull and upper  cervical spine: The visualized skull base, calvarium, upper cervical spine and extracranial soft tissues are normal. Sinuses/Orbits: No fluid levels or advanced mucosal thickening. No mastoid or middle ear effusion. Normal orbits. IMPRESSION: Normal MRI of the brain for age. Electronically Signed   By: Ulyses Jarred M.D.   On: 05/02/2017 18:12    EKG: Independently reviewed. Normal sinus rhythm, no significant ST-T changes  Assessment/Plan Principal Problem:   Aphasia Active Problems:   GERD (gastroesophageal reflux disease)   Headache   Aphasia and headache -EDP spoke with neurology on call. Differential diagnosis includes complex migraine versus TIA. Neurology has recommended MRI brain. MRI brain negative for stroke. Aphasia resolved.  -Neuro checks -Tylenol, ibuprofen for headache, antiemetic ordered  GERD -Pepcid prn   Allergies -Claritin, flonase    DVT prophylaxis: lovenox Code Status: full  Family Communication: family/friends at bedside  Disposition Plan: pending improvement, back home once stable  Consults called: Neurology by EDP, not formally consulted   Admission status: observation   Severity of Illness: The appropriate patient status for this patient is OBSERVATION. Observation status is judged to be reasonable and necessary in order to provide the required intensity of service to ensure the patient's safety. The patient's presenting symptoms, physical exam findings, and initial radiographic and laboratory data in the context of their medical condition is felt to place them at decreased risk for further clinical deterioration. Furthermore, it is anticipated that the patient will be medically stable for discharge from the hospital within 2 midnights of admission. The following factors support the patient status of observation.   " The patient's presenting symptoms include headache and aphasia. " The physical exam findings include resolved aphasia. " The initial  radiographic and laboratory data are unremarkable.   Dessa Phi, DO [facility] [email or web address] Password [facility] 05/02/2017, 6:32 PM

## 2017-05-02 NOTE — Telephone Encounter (Signed)
Pt called and stated that she has onset of neck pain and difficulty speaking. Pt was advised to go to the ER.

## 2017-05-02 NOTE — Progress Notes (Signed)
Received to Room 1434 [id number] friends at [facility] NAD noted

## 2017-05-02 NOTE — ED Provider Notes (Signed)
Emergency Department Provider Note   I have reviewed the triage vital signs and the nursing notes.   HISTORY  Chief Complaint Headache   HPI Cristina Sawyer is a 76 y.o. female with PMH of CREST syndrome, HLD, GERD, and osteopenia presents to the ED for evaluation of headache, shoulder/neck discomfort, and new onset speech difficulty. The headache began last night and was mild. She had some associated neck and shoulder discomfort. Denies fever. This AM her symptoms worsened gradually throughout the AM. HA is located in the back of her head. At approximately 1:30 PM she was speaking with someone on the phone and had significant difficulty finding the right word to say and felt like her speech was different. No weakness or numbness. No vision changes. She had some associated nausea. Speech symptoms have since resolved. No history of CVA.   Past Medical History:  Diagnosis Date  . Arthritis   . Cataract   . CREST syndrome ([place])    pt denies (saw rheum)  . Diverticulosis   . Dyslipidemia   . GERD (gastroesophageal reflux disease)   . Osteoporosis    OSTEOPENIA  . Personal history of colonic adenoma 08/09/2008  . Vitamin D deficiency     Patient Active Problem List   Diagnosis Date Noted  . Aphasia 05/02/2017  . Headache 05/02/2017  . Allergic rhinitis due to pollen 12/29/2014  . GERD (gastroesophageal reflux disease) 01/17/2014  . Personal history of colonic adenoma 08/09/2008    Past Surgical History:  Procedure Laterality Date  . ABDOMINAL HYSTERECTOMY  1975  . APPENDECTOMY  1975  . COLONOSCOPY    . thyroid nodule  2000      Allergies Codeine and Ciprofloxacin hcl  Family History  Problem Relation Age of Onset  . Colon cancer Neg Hx     Social History Social History  Substance Use Topics  . Smoking status: Never Smoker  . Smokeless tobacco: Never Used  . Alcohol use No    Review of Systems  Constitutional: No fever/chills Eyes: No visual  changes. ENT: No sore throat. Cardiovascular: Denies chest pain. Respiratory: Denies shortness of breath. Gastrointestinal: No abdominal pain. Positive nausea, no vomiting.  No diarrhea.  No constipation. Genitourinary: Negative for dysuria. Musculoskeletal: Negative for back pain. Skin: Negative for rash. Neurological: Negative for focal weakness or numbness. Positive HA and speech change.   10-point ROS otherwise negative.  ____________________________________________   PHYSICAL EXAM:  VITAL SIGNS: Vitals:   05/02/17 1832 05/02/17 2218  BP: (!) 171/74 130/65  Pulse: (!) 59 60  Resp: 16 16  Temp: 98.4 F (36.9 C) 98.7 F (37.1 C)  SpO2: 100% 97%     Constitutional: Alert and oriented. Well appearing and in no acute distress. Eyes: Conjunctivae are normal. PERRL. EOMI. Head: Atraumatic. Nose: No congestion/rhinnorhea. Mouth/Throat: Mucous membranes are moist.  Neck: No stridor.  No meningeal signs.  Cardiovascular: Normal rate, regular rhythm. Good peripheral circulation. Grossly normal heart sounds.   Respiratory: Normal respiratory effort.  No retractions. Lungs CTAB. Gastrointestinal: Soft and nontender. No distention.  Musculoskeletal: No lower extremity tenderness nor edema. No gross deformities of extremities. Neurologic:  Normal speech and language. No gross focal neurologic deficits are appreciated. CN 2-12 normal. No pronator drift. Normal finger-to-nose testing. Normal heel-to-shin.  Skin:  Skin is warm, dry and intact. No rash noted.  ____________________________________________   LABS (all labs ordered are listed, but only abnormal results are displayed)  Labs Reviewed  COMPREHENSIVE METABOLIC PANEL - Abnormal; Notable  for the following:       Result Value   Glucose, Bld 105 (*)    Creatinine, Ser 1.02 (*)    GFR calc non Af Amer 52 (*)    All other components within normal limits  I-STAT CHEM 8, ED - Abnormal; Notable for the following:     Creatinine, Ser 1.10 (*)    Glucose, Bld 104 (*)    Calcium, Ion 1.11 (*)    Hemoglobin 15.3 (*)    All other components within normal limits  ETHANOL  PROTIME-INR  APTT  CBC  DIFFERENTIAL  BASIC METABOLIC PANEL  CBC  I-STAT TROPONIN, ED   ____________________________________________  EKG  EKG not crossing over from MUSE.   Rate: 61 PR: 177 QTc: 418  NSR. Normal axis. Narrow QRS. T wave flattening laterally. No ST changes. No STEMI.  ____________________________________________  RADIOLOGY  Ct Head Wo Contrast  Result Date: 05/02/2017 CLINICAL DATA:  Alert and oriented.  Headache. EXAM: CT HEAD WITHOUT CONTRAST TECHNIQUE: Contiguous axial images were obtained from the base of the skull through the vertex without intravenous contrast. COMPARISON:  05/18/2008 FINDINGS: Brain: No evidence of acute infarction, hemorrhage, hydrocephalus, extra-axial collection or mass lesion/mass effect. Vascular: No hyperdense vessel or unexpected calcification. Skull: No osseous abnormality. Sinuses/Orbits: Visualized paranasal sinuses are clear. Visualized mastoid sinuses are clear. Visualized orbits demonstrate no focal abnormality. Other: None IMPRESSION: 1. No acute intracranial pathology. Electronically Signed   By: Kathreen Devoid   On: 05/02/2017 15:48   Mr Brain Wo Contrast  Result Date: 05/02/2017 CLINICAL DATA:  Thunderclap headache EXAM: MRI HEAD WITHOUT CONTRAST TECHNIQUE: Multiplanar, multiecho pulse sequences of the brain and surrounding structures were obtained without intravenous contrast. COMPARISON:  Head CT 05/02/2017 Brain MRI 05/19/2008 FINDINGS: Brain: Partially empty sella. There is no focal diffusion restriction to indicate acute infarct. The brain parenchymal signal is normal and there is no mass lesion. No intraparenchymal hematoma or chronic microhemorrhage. Brain volume is normal for age without lobar predominant atrophy. The dura is normal and there is no extra-axial  collection. Vascular: Major intracranial arterial and venous sinus flow voids are preserved. Skull and upper cervical spine: The visualized skull base, calvarium, upper cervical spine and extracranial soft tissues are normal. Sinuses/Orbits: No fluid levels or advanced mucosal thickening. No mastoid or middle ear effusion. Normal orbits. IMPRESSION: Normal MRI of the brain for age. Electronically Signed   By: Ulyses Jarred M.D.   On: 05/02/2017 18:12    ____________________________________________   PROCEDURES  Procedure(s) performed:   Procedures  None ____________________________________________   INITIAL IMPRESSION / ASSESSMENT AND PLAN / ED COURSE  Pertinent labs & imaging results that were available during my care of the patient were reviewed by me and considered in my medical decision making (see chart for details).  Patient presents to the ED for evaluation of HA and shoulder/neck pain since yesterday. Symptoms have gradually worsened since that time. Currently with 8/10 HA. No sudden onset, maximum intensity HA. No fever or chills to suggest infectious etiology. Extremely low suspicion for [facility] with gradually worsening HA throughout the day. No sudden onset, maximal intensity HA. Word finding difficulty and aphasia resolved. Not a code stroke as symptoms have resolved. Will send labs and follow STAT head CT.   Spoke with Dr. Londell Moh with Neurology. Agree with plan for MRI given TIA symptoms and patient's age with new onset HA. Patient HA improving with Toradol and Reglan.   Discussed patient's case with Hospitalist, Dr. Maylene Roes. Patient  and family (if present) updated with plan. Care transferred to [facility] service.  I reviewed all nursing notes, vitals, pertinent old records, EKGs, labs, imaging (as available).  ____________________________________________  FINAL CLINICAL IMPRESSION(S) / ED DIAGNOSES  Final diagnoses:  Word finding difficulty  Acute non intractable  tension-type headache     MEDICATIONS GIVEN DURING THIS VISIT:  Medications  famotidine (PEPCID) tablet 20 mg (not administered)  enoxaparin (LOVENOX) injection 40 mg (40 mg Subcutaneous Given 05/02/17 1949)  ketorolac (TORADOL) 15 MG/ML injection 15 mg (15 mg Intravenous Given 05/02/17 2300)  acetaminophen (TYLENOL) tablet 650 mg (not administered)    Or  acetaminophen (TYLENOL) suppository 650 mg (not administered)  ibuprofen (ADVIL,MOTRIN) tablet 400 mg (400 mg Oral Given 05/02/17 2008)  ondansetron (ZOFRAN) tablet 4 mg (not administered)    Or  ondansetron (ZOFRAN) injection 4 mg (not administered)  fluticasone (FLONASE) 50 MCG/ACT nasal spray 2 spray (not administered)  ketorolac (TORADOL) 30 MG/ML injection 30 mg (30 mg Intravenous Given 05/02/17 1635)  metoCLOPramide (REGLAN) injection 10 mg (10 mg Intravenous Given 05/02/17 1633)  loratadine (CLARITIN) tablet 10 mg (10 mg Oral Given 05/02/17 1949)     NEW OUTPATIENT MEDICATIONS STARTED DURING THIS VISIT:  None   Note:  This document was prepared using Dragon voice recognition software and may include unintentional dictation errors.  Nanda Quinton, MD Emergency Medicine   Long, [facility], MD 05/02/17 [phone]

## 2017-05-02 NOTE — ED Notes (Signed)
Call report to April at [phone] at 1815.

## 2017-05-02 NOTE — ED Triage Notes (Signed)
Patient is alert and oriented x4.  She is being seen for a headache that started this morning. She states this is the worst head ache she has ever had.  Currently she rates he pain 8 of 10 with nausea.

## 2017-05-03 ENCOUNTER — Observation Stay (HOSPITAL_BASED_OUTPATIENT_CLINIC_OR_DEPARTMENT_OTHER): Payer: Medicare HMO

## 2017-05-03 DIAGNOSIS — G459 Transient cerebral ischemic attack, unspecified: Secondary | ICD-10-CM

## 2017-05-03 DIAGNOSIS — R4701 Aphasia: Secondary | ICD-10-CM | POA: Diagnosis not present

## 2017-05-03 LAB — BASIC METABOLIC PANEL
Anion gap: 8 (ref 5–15)
BUN: 13 mg/dL (ref 6–20)
CALCIUM: 9 mg/dL (ref 8.9–10.3)
CHLORIDE: 108 mmol/L (ref 101–111)
CO2: 26 mmol/L (ref 22–32)
CREATININE: 0.94 mg/dL (ref 0.44–1.00)
GFR calc Af Amer: >60 mL/min (ref >60)
GFR calc non Af Amer: 57 mL/min — ABNORMAL LOW (ref >60)
GLUCOSE: 91 mg/dL (ref 65–99)
Potassium: 4.2 mmol/L (ref 3.5–5.1)
Sodium: 142 mmol/L (ref 135–145)

## 2017-05-03 LAB — CBC
HCT: 40.5 % (ref 36.0–46.0)
Hemoglobin: 13.6 g/dL (ref 12.0–15.0)
MCH: 28.2 pg (ref 26.0–34.0)
MCHC: 33.6 g/dL (ref 30.0–36.0)
MCV: 84 fL (ref 78.0–100.0)
PLATELETS: 195 10*3/uL (ref 150–400)
RBC: 4.82 MIL/uL (ref 3.87–5.11)
RDW: 14.3 % (ref 11.5–15.5)
WBC: 10 10*3/uL (ref 4.0–10.5)

## 2017-05-03 LAB — ECHOCARDIOGRAM COMPLETE
Height: 64 in
Weight: 3001.6 oz

## 2017-05-03 MED ORDER — POLYETHYLENE GLYCOL 3350 17 G PO PACK
17.0000 g | PACK | Freq: Every day | ORAL | Status: DC | PRN
  Administered 2017-05-03: 17 g via ORAL
  Filled 2017-05-03: qty 1

## 2017-05-03 MED ORDER — ZOLPIDEM TARTRATE 5 MG PO TABS
5.0000 mg | ORAL_TABLET | Freq: Every evening | ORAL | Status: DC | PRN
  Administered 2017-05-03 – 2017-05-04 (×2): 5 mg via ORAL
  Filled 2017-05-03 (×2): qty 1

## 2017-05-03 MED ORDER — POLYVINYL ALCOHOL 1.4 % OP SOLN
1.0000 [drp] | OPHTHALMIC | Status: DC | PRN
  Filled 2017-05-03: qty 15

## 2017-05-03 MED ORDER — ASPIRIN EC 81 MG PO TBEC
81.0000 mg | DELAYED_RELEASE_TABLET | Freq: Every day | ORAL | Status: DC
  Administered 2017-05-03 – 2017-05-05 (×3): 81 mg via ORAL
  Filled 2017-05-03 (×3): qty 1

## 2017-05-03 MED ORDER — DOCUSATE SODIUM 100 MG PO CAPS: 100.0000 mg | ORAL_CAPSULE | Freq: Two times a day (BID) | ORAL | Status: DC | PRN

## 2017-05-03 NOTE — Progress Notes (Signed)
*  PRELIMINARY RESULTS* Echocardiogram 2D Echocardiogram has been performed.  Leavy Cella 05/03/2017, 2:00 PM

## 2017-05-03 NOTE — Progress Notes (Signed)
*  PRELIMINARY RESULTS* Vascular Ultrasound Carotid Duplex (Doppler) has been completed.  Preliminary findings: Findings consistent with a 1-39 percent stenosis involving the right internal carotid artery and the left internal carotid artery. Difficult evaluation of plaque bilaterally due to depth of vessels and acoustic shadowing.  Everrett Coombe 05/03/2017, 1:27 PM

## 2017-05-03 NOTE — Progress Notes (Signed)
Report called to Earlie Server, Therapist, sports at [facility]. [facility]  Called for transport.  Barbee Shropshire. Brigitte Pulse, RN

## 2017-05-03 NOTE — Progress Notes (Signed)
PROGRESS NOTE    Cristina Sawyer  MVH:[id number] DOB: 1941-08-13 DOA: 05/02/2017 PCP: Denita Lung, MD     Brief Narrative:  Cristina Sawyer is a 76 y.o. female with medical history significant of CREST syndrome, HLD, GERD, and osteopenia who presented to the emergency department due to complaint of headache and new onset speech difficulty. She states that her headache started at her neck and radiated upward. It started yesterday and has progressively gotten worse. It got to be very severe today and she had a few minutes of word-finding difficulties, which prompted her to seek care. She had some blurry vision but no loss of vision, no chest pain or shortness of breath. She deals with allergies and has some congestion as well as dry cough. Admits to nausea and constipation but no vomiting. No dysuria. Her headache still persists but word finding difficulties have resolved. CT head completed, did not show any acute pathology. EDP spoke with Dr. Londell Moh with neurology. He recommended MRI given TIA symptom as well as new-onset headache. Differential diagnosis includes complex migraine versus TIA. Word finding difficulty and aphasia has resolved. MRI was negative for stroke but neurology recommended transfer to [facility] for further work up.   Patient has also been under a lot of stress recently. She has recently lost her brother, sister, and nephew and has also have had trouble sleeping at night.  Assessment & Plan:   Principal Problem:   Aphasia Active Problems:   GERD (gastroesophageal reflux disease)   Headache  Aphasia and headache -Differential diagnosis includes complex migraine versus TIA. Neurology has recommended MRI brain. MRI brain negative for stroke. Aphasia resolved.  -Neuro checks -Tylenol, ibuprofen for headache, antiemetic ordered -Headache much improved this morning, speech clear -Carotid doppler  -Echo  -Lipid  -Ha1c  -Aspirin  -Per neurology, transfer to [facility]  for further evaluation. They are consulted.   GERD -Pepcid prn   Allergies -Claritin, flonase   Insomnia -Ambien prn for sleep. She has recently lost 3 family members within a few weeks of each other and has had trouble sleeping.    DVT prophylaxis: lovenox Code Status: full Family Communication: no family at bedside Disposition Plan: pending work up   Consultants:   Neurology  Procedures:   None   Antimicrobials:  Anti-infectives    None       Subjective: Feeling better this morning. Headache much improved. Speech remains clear. No other neurological deficits. Congestion remains issue.   Objective: Vitals:   05/02/17 1714 05/02/17 1832 05/02/17 2218 05/03/17 0445  BP: (!) 129/99 (!) 171/74 130/65 (!) 142/63  Pulse: 65 (!) 59 60 (!) 57  Resp: 13 16 16 15   Temp: 98.3 F (36.8 C) 98.4 F (36.9 C) 98.7 F (37.1 C) 97.6 F (36.4 C)  TempSrc: Oral Oral Oral Oral  SpO2: 100% 100% 97% 96%  Weight:  85.1 kg (187 lb 9.6 oz)    Height:  5\' 4"  (1.626 m)      Intake/Output Summary (Last 24 hours) at 05/03/17 0845 Last data filed at 05/03/17 0200  Gross per 24 hour  Intake                0 ml  Output                0 ml  Net                0 ml   Filed Weights   05/02/17 1438 05/02/17  1832  Weight: 83.9 kg (185 lb) 85.1 kg (187 lb 9.6 oz)    Examination:  General exam: Appears calm and comfortable  Respiratory system: Clear to auscultation. Respiratory effort normal. Cardiovascular system: S1 & S2 heard, RRR. No JVD, murmurs, rubs, gallops or clicks. No pedal edema. Gastrointestinal system: Abdomen is nondistended, soft and nontender. No organomegaly or masses felt. Normal bowel sounds heard. Central nervous system: Alert and oriented. No focal neurological deficits. Extremities: Symmetric 5 x 5 power. Skin: No rashes, lesions or ulcers Psychiatry: Judgement and insight appear normal. Mood & affect appropriate.   Data Reviewed: I have personally  reviewed following labs and imaging studies  CBC:  Recent Labs Lab 05/02/17 1508 05/02/17 1521 05/03/17 0345  WBC 9.9  --  10.0  NEUTROABS 6.2  --   --   HGB 14.8 15.3* 13.6  HCT 43.1 45.0 40.5  MCV 84.3  --  84.0  PLT 203  --  166   Basic Metabolic Panel:  Recent Labs Lab 05/02/17 1508 05/02/17 1521 05/03/17 0345  NA 142 141 142  K 3.8 3.8 4.2  CL 107 105 108  CO2 25  --  26  GLUCOSE 105* 104* 91  BUN 13 13 13   CREATININE 1.02* 1.10* 0.94  CALCIUM 9.3  --  9.0   GFR: Estimated Creatinine Clearance: 53.8 mL/min (by C-G formula based on SCr of 0.94 mg/dL). Liver Function Tests:  Recent Labs Lab 05/02/17 1508  AST 19  ALT 15  ALKPHOS 79  BILITOT 0.8  PROT 7.4  ALBUMIN 4.2   No results for input(s): LIPASE, AMYLASE in the last 168 hours. No results for input(s): AMMONIA in the last 168 hours. Coagulation Profile:  Recent Labs Lab 05/02/17 1508  INR 0.97   Cardiac Enzymes: No results for input(s): CKTOTAL, CKMB, CKMBINDEX, TROPONINI in the last 168 hours. BNP (last 3 results) No results for input(s): PROBNP in the last 8760 hours. HbA1C: No results for input(s): HGBA1C in the last 72 hours. CBG: No results for input(s): GLUCAP in the last 168 hours. Lipid Profile: No results for input(s): CHOL, HDL, LDLCALC, TRIG, CHOLHDL, LDLDIRECT in the last 72 hours. Thyroid Function Tests: No results for input(s): TSH, T4TOTAL, FREET4, T3FREE, THYROIDAB in the last 72 hours. Anemia Panel: No results for input(s): VITAMINB12, FOLATE, FERRITIN, TIBC, IRON, RETICCTPCT in the last 72 hours. Sepsis Labs: No results for input(s): PROCALCITON, LATICACIDVEN in the last 168 hours.  No results found for this or any previous visit (from the past 240 hour(s)).     Radiology Studies: Ct Head Wo Contrast  Result Date: 05/02/2017 CLINICAL DATA:  Alert and oriented.  Headache. EXAM: CT HEAD WITHOUT CONTRAST TECHNIQUE: Contiguous axial images were obtained from the base  of the skull through the vertex without intravenous contrast. COMPARISON:  05/18/2008 FINDINGS: Brain: No evidence of acute infarction, hemorrhage, hydrocephalus, extra-axial collection or mass lesion/mass effect. Vascular: No hyperdense vessel or unexpected calcification. Skull: No osseous abnormality. Sinuses/Orbits: Visualized paranasal sinuses are clear. Visualized mastoid sinuses are clear. Visualized orbits demonstrate no focal abnormality. Other: None IMPRESSION: 1. No acute intracranial pathology. Electronically Signed   By: Kathreen Devoid   On: 05/02/2017 15:48   Mr Brain Wo Contrast  Result Date: 05/02/2017 CLINICAL DATA:  Thunderclap headache EXAM: MRI HEAD WITHOUT CONTRAST TECHNIQUE: Multiplanar, multiecho pulse sequences of the brain and surrounding structures were obtained without intravenous contrast. COMPARISON:  Head CT 05/02/2017 Brain MRI 05/19/2008 FINDINGS: Brain: Partially empty sella. There is no focal  diffusion restriction to indicate acute infarct. The brain parenchymal signal is normal and there is no mass lesion. No intraparenchymal hematoma or chronic microhemorrhage. Brain volume is normal for age without lobar predominant atrophy. The dura is normal and there is no extra-axial collection. Vascular: Major intracranial arterial and venous sinus flow voids are preserved. Skull and upper cervical spine: The visualized skull base, calvarium, upper cervical spine and extracranial soft tissues are normal. Sinuses/Orbits: No fluid levels or advanced mucosal thickening. No mastoid or middle ear effusion. Normal orbits. IMPRESSION: Normal MRI of the brain for age. Electronically Signed   By: Ulyses Jarred M.D.   On: 05/02/2017 18:12      Scheduled Meds: . aspirin EC  81 mg Oral Daily  . enoxaparin (LOVENOX) injection  40 mg Subcutaneous Q24H   Continuous Infusions:   LOS: 0 days    Time spent: 30 minutes   Dessa Phi, DO [facility] [email or web address] Password  [email or web address] 05/03/2017, 8:45 AM

## 2017-05-03 NOTE — Progress Notes (Signed)
Called [facility]-medical to inform them patient still coming , just awaiting transport from [facility].  Barbee Shropshire. Brigitte Pulse, RN

## 2017-05-03 NOTE — Progress Notes (Signed)
Called [facility] successfully and arranged transport for patient. Informed it would be several hours for transport, updated patient.  Barbee Shropshire. Brigitte Pulse, RN

## 2017-05-03 NOTE — Progress Notes (Addendum)
Called by neurology overnight requesting transfer to [facility] of patient for completion of TIA workup. MRI completed and noted to be negative, but echocardiogram likely to be done at [facility] long. Transfer order placed and Emtela signed.

## 2017-05-03 NOTE — Progress Notes (Signed)
Attempted to call transport to [facility] x 3 with no answer.  Barbee Shropshire. Brigitte Pulse, RN

## 2017-05-04 DIAGNOSIS — R4701 Aphasia: Secondary | ICD-10-CM | POA: Diagnosis not present

## 2017-05-04 DIAGNOSIS — K210 Gastro-esophageal reflux disease with esophagitis: Secondary | ICD-10-CM | POA: Diagnosis not present

## 2017-05-04 DIAGNOSIS — G459 Transient cerebral ischemic attack, unspecified: Secondary | ICD-10-CM | POA: Diagnosis not present

## 2017-05-04 DIAGNOSIS — R51 Headache: Secondary | ICD-10-CM

## 2017-05-04 LAB — LIPID PANEL
CHOL/HDL RATIO: 3.1 ratio
CHOLESTEROL: 214 mg/dL — AB (ref 0–200)
HDL: 70 mg/dL (ref >40)
LDL Cholesterol: 127 mg/dL — ABNORMAL HIGH (ref 0–99)
TRIGLYCERIDES: 87 mg/dL (ref <150)
VLDL: 17 mg/dL (ref 0–40)

## 2017-05-04 LAB — VAS US CAROTID
LCCAPSYS: 142 cm/s
LEFT ECA DIAS: -20 cm/s
LEFT VERTEBRAL DIAS: -11 cm/s
LICADSYS: -102 cm/s
Left CCA dist dias: -11 cm/s
Left CCA dist sys: -78 cm/s
Left CCA prox dias: 16 cm/s
Left ICA dist dias: -28 cm/s
RCCAPDIAS: 20 cm/s
RCCAPSYS: 123 cm/s
RIGHT ECA DIAS: -14 cm/s
RIGHT VERTEBRAL DIAS: -15 cm/s
Right cca dist sys: -113 cm/s

## 2017-05-04 LAB — HEMOGLOBIN A1C
Hgb A1c MFr Bld: 5.6 % (ref 4.8–5.6)
MEAN PLASMA GLUCOSE: 114.02 mg/dL

## 2017-05-04 LAB — CK: CK TOTAL: 93 U/L (ref 38–234)

## 2017-05-04 MED ORDER — ATORVASTATIN CALCIUM 10 MG PO TABS
20.0000 mg | ORAL_TABLET | Freq: Every day | ORAL | Status: DC
  Administered 2017-05-04: 20 mg via ORAL
  Filled 2017-05-04: qty 2

## 2017-05-04 MED ORDER — LORAZEPAM 2 MG/ML IJ SOLN
1.0000 mg | Freq: Once | INTRAMUSCULAR | Status: AC | PRN
  Administered 2017-05-04: 1 mg via INTRAVENOUS
  Filled 2017-05-04: qty 1

## 2017-05-04 MED ORDER — LORAZEPAM 2 MG/ML IJ SOLN
0.5000 mg | Freq: Once | INTRAMUSCULAR | Status: AC
  Administered 2017-05-04: 0.5 mg via INTRAVENOUS
  Filled 2017-05-04: qty 1

## 2017-05-04 MED ORDER — AMLODIPINE BESYLATE 5 MG PO TABS
5.0000 mg | ORAL_TABLET | Freq: Every day | ORAL | Status: DC
  Administered 2017-05-04 – 2017-05-05 (×2): 5 mg via ORAL
  Filled 2017-05-04 (×2): qty 1

## 2017-05-04 NOTE — Progress Notes (Signed)
OT Cancellation Note  Patient Details Name: Cristina Sawyer MRN: [id number] DOB: [birth year]   Cancelled Treatment:    Reason Eval/Treat Not Completed: OT screened, no needs identified, will sign off. Discussed with PT. Pt currently participate in ADL at baseline with all symptoms resolved at this time. Imaging negative for acute abnormality. No OT needs identified and will sign off. Thank you for this referral!  Norman Herrlich, MS OTR/L  Pager: [place] A [name] 05/04/2017, 2:33 PM

## 2017-05-04 NOTE — Progress Notes (Signed)
SLP Cancellation Note  Patient Details Name: Cristina Sawyer MRN: [id number] DOB: [birth year]   Cancelled treatment:       Reason Eval/Treat Not Completed: SLP screened, no needs identified, will sign off. Orders for bedside swallow evaluation, cognitive linguistic evaluation received. Patient passed RN swallow screen and is on a diet. Imaging negative and per RN all symptoms resolved. Will s/o.  Deneise Lever, [place], [place] Speech-Language Pathologist [place] 05/04/2017, 1:48 PM

## 2017-05-04 NOTE — Progress Notes (Signed)
MRI called to come get patient.  Patient received one time dose Ativan 1mg  IV at 1715.  Patient not taken to MRI yet.  Patient feels Ativan given is not currently working and is worried about going into MRI due to claustrophobia.  Patient feels she does not want to go or wants another dose of Ativan IV. On call MD text page of concerns.

## 2017-05-04 NOTE — Consult Note (Signed)
Referring Physician: Dr. Maylene Roes    Chief Complaint: TIA  HPI: Cristina Sawyer is an 76 y.o. female who presented to the ED with c/c of occipital headache with neck pain and new onset of expressive dysphasia. The HA began along the back of her neck, radiating upwards. The headache began on Thursday then progressively worsened. It was very severe on the day of admission to [facility] (Friday) and was associated with a few minutes of word-finding difficulty. She also had some blurring of her vision involving both eyes. Denies chest pain, facial droop, limb weakness, limb numbness, SOB or limb pain. Did have some left sided abdominal pain a few days prior to admission which she attributed to constipation. Her headache was still present on admission but word finding difficulties had resolved. Initial DDx included unusual presentation of complicated migraine in an elderly patient, versus TIA.   CT head at [facility] showed no acute abnormality.    Her PMHx includes CREST syndrome, dyslipidemia, osteoporosis and vitamin D deficiency. She has no prior history of stroke.   Past Medical History:  Diagnosis Date  . Arthritis   . Cataract   . CREST syndrome ([place])    pt denies (saw rheum)  . Diverticulosis   . Dyslipidemia   . GERD (gastroesophageal reflux disease)   . Osteoporosis    OSTEOPENIA  . Personal history of colonic adenoma 08/09/2008  . Vitamin D deficiency     Past Surgical History:  Procedure Laterality Date  . ABDOMINAL HYSTERECTOMY  1975  . APPENDECTOMY  1975  . COLONOSCOPY    . thyroid nodule  2000    Family History  Problem Relation Age of Onset  . Colon cancer Neg Hx    Social History:  reports that she has never smoked. She has never used smokeless tobacco. She reports that she does not drink alcohol or use drugs.  Allergies:  Allergies  Allergen Reactions  . Codeine     REACTION: itch/nausea  . Ciprofloxacin Hcl Rash    Medications:  Prior to Admission:  Prescriptions Prior to  Admission  Medication Sig Dispense Refill Last Dose  . Chlorphen-Phenyleph-APAP (CORICIDIN D COLD/FLU/SINUS) 2-5-325 MG TABS Take 1 tablet by mouth daily as needed (allergies).   05/01/2017 at Unknown time  . cetirizine (ZYRTEC) 10 MG tablet Take 10 mg by mouth daily as needed for allergies.    unknown  . ranitidine (ZANTAC) 75 MG tablet Take 75 mg by mouth 2 (two) times daily as needed for heartburn.    unknown   Scheduled: . aspirin EC  81 mg Oral Daily  . enoxaparin (LOVENOX) injection  40 mg Subcutaneous Q24H    ROS: Nausea without vomiting at time of presentation. Other ROS as per HPI.   Physical Examination: Blood pressure 125/81, pulse 76, temperature 97.7 F (36.5 C), temperature source Oral, resp. rate 18, height '5\' 4"'  (1.626 m), weight 85 kg (187 lb 8 oz), SpO2 98 %.  HEENT: [place]/AT Lungs: Respirations unlabored Ext: No edema  Neurologic Examination: Mental Status: Alert, fully oriented, thought content appropriate. Speech fluent with intact naming and repetition. Had some difficulty with a 2-step directional command in the context of recently being wakened from sleep. Good insight. Pleasant and cooperative.  Cranial Nerves: II:  Visual fields intact, PERRL III,IV, VI: ptosis not present, EOMI without nystagmus V,VII: smile symmetric, facial temp sensation normal bilaterally VIII: hearing intact to conversation IX,X: palate rises symmetrically XI: bilateral shoulder shrug symmetric XII: midline tongue extension Motor: Right :  Upper extremity   5/5    Left:     Upper extremity   5/5  Lower extremity   5/5     Lower extremity   5/5 Tone and bulk: Normal tone throughout; no atrophy noted No pronator drift Sensory: Temp and light touch intact throughout, bilaterally. No extinction Deep Tendon Reflexes:  2+ bilateral upper extremities and patellae. 1+ achilles bilaterally. Toes downgoing.  Cerebellar: No ataxia with FNF bilaterally Gait: Deferred   Results for orders  placed or performed during the hospital encounter of 05/02/17 (from the past 48 hour(s))  Ethanol     Status: None   Collection Time: 05/02/17  3:08 PM  Result Value Ref Range   Alcohol, Ethyl (B) <5 <5 mg/dL    Comment:        LOWEST DETECTABLE LIMIT FOR SERUM ALCOHOL IS 5 mg/dL FOR MEDICAL PURPOSES ONLY   Protime-INR     Status: None   Collection Time: 05/02/17  3:08 PM  Result Value Ref Range   Prothrombin Time 12.9 11.4 - 15.2 seconds   INR 0.97   APTT     Status: None   Collection Time: 05/02/17  3:08 PM  Result Value Ref Range   aPTT 33 24 - 36 seconds  CBC     Status: None   Collection Time: 05/02/17  3:08 PM  Result Value Ref Range   WBC 9.9 4.0 - 10.5 K/uL   RBC 5.11 3.87 - 5.11 MIL/uL   Hemoglobin 14.8 12.0 - 15.0 g/dL   HCT 43.1 36.0 - 46.0 %   MCV 84.3 78.0 - 100.0 fL   MCH 29.0 26.0 - 34.0 pg   MCHC 34.3 30.0 - 36.0 g/dL   RDW 14.4 11.5 - 15.5 %   Platelets 203 150 - 400 K/uL  Differential     Status: None   Collection Time: 05/02/17  3:08 PM  Result Value Ref Range   Neutrophils Relative % 62 %   Neutro Abs 6.2 1.7 - 7.7 K/uL   Lymphocytes Relative 32 %   Lymphs Abs 3.1 0.7 - 4.0 K/uL   Monocytes Relative 4 %   Monocytes Absolute 0.4 0.1 - 1.0 K/uL   Eosinophils Relative 2 %   Eosinophils Absolute 0.2 0.0 - 0.7 K/uL   Basophils Relative 0 %   Basophils Absolute 0.0 0.0 - 0.1 K/uL  Comprehensive metabolic panel     Status: Abnormal   Collection Time: 05/02/17  3:08 PM  Result Value Ref Range   Sodium 142 135 - 145 mmol/L   Potassium 3.8 3.5 - 5.1 mmol/L   Chloride 107 101 - 111 mmol/L   CO2 25 22 - 32 mmol/L   Glucose, Bld 105 (H) 65 - 99 mg/dL   BUN 13 6 - 20 mg/dL   Creatinine, Ser 1.02 (H) 0.44 - 1.00 mg/dL   Calcium 9.3 8.9 - 10.3 mg/dL   Total Protein 7.4 6.5 - 8.1 g/dL   Albumin 4.2 3.5 - 5.0 g/dL   AST 19 15 - 41 U/L   ALT 15 14 - 54 U/L   Alkaline Phosphatase 79 38 - 126 U/L   Total Bilirubin 0.8 0.3 - 1.2 mg/dL   GFR calc non Af Amer  52 (L) >60 mL/min   GFR calc Af Amer >60 >60 mL/min    Comment: (NOTE) The eGFR has been calculated using the CKD EPI equation. This calculation has not been validated in all clinical situations. eGFR's persistently <60 mL/min signify  possible Chronic Kidney Disease.    Anion gap 10 5 - 15  I-stat troponin, ED (not at [facility], [facility])     Status: None   Collection Time: 05/02/17  3:19 PM  Result Value Ref Range   Troponin i, poc 0.01 0.00 - 0.08 ng/mL   Comment 3            Comment: Due to the release kinetics of cTnI, a negative result within the first hours of the onset of symptoms does not rule out myocardial infarction with certainty. If myocardial infarction is still suspected, repeat the test at appropriate intervals.   I-Stat Chem 8, ED  (not at [facility], [facility])     Status: Abnormal   Collection Time: 05/02/17  3:21 PM  Result Value Ref Range   Sodium 141 135 - 145 mmol/L   Potassium 3.8 3.5 - 5.1 mmol/L   Chloride 105 101 - 111 mmol/L   BUN 13 6 - 20 mg/dL   Creatinine, Ser 1.10 (H) 0.44 - 1.00 mg/dL   Glucose, Bld 104 (H) 65 - 99 mg/dL   Calcium, Ion 1.11 (L) 1.15 - 1.40 mmol/L   TCO2 27 0 - 100 mmol/L   Hemoglobin 15.3 (H) 12.0 - 15.0 g/dL   HCT 45.0 36.0 - 16.1 %  Basic metabolic panel     Status: Abnormal   Collection Time: 05/03/17  3:45 AM  Result Value Ref Range   Sodium 142 135 - 145 mmol/L   Potassium 4.2 3.5 - 5.1 mmol/L   Chloride 108 101 - 111 mmol/L   CO2 26 22 - 32 mmol/L   Glucose, Bld 91 65 - 99 mg/dL   BUN 13 6 - 20 mg/dL   Creatinine, Ser 0.94 0.44 - 1.00 mg/dL   Calcium 9.0 8.9 - 10.3 mg/dL   GFR calc non Af Amer 57 (L) >60 mL/min   GFR calc Af Amer >60 >60 mL/min    Comment: (NOTE) The eGFR has been calculated using the CKD EPI equation. This calculation has not been validated in all clinical situations. eGFR's persistently <60 mL/min signify possible Chronic Kidney Disease.    Anion gap 8 5 - 15  CBC     Status: None   Collection Time: 05/03/17   3:45 AM  Result Value Ref Range   WBC 10.0 4.0 - 10.5 K/uL   RBC 4.82 3.87 - 5.11 MIL/uL   Hemoglobin 13.6 12.0 - 15.0 g/dL   HCT 40.5 36.0 - 46.0 %   MCV 84.0 78.0 - 100.0 fL   MCH 28.2 26.0 - 34.0 pg   MCHC 33.6 30.0 - 36.0 g/dL   RDW 14.3 11.5 - 15.5 %   Platelets 195 150 - 400 K/uL   Ct Head Wo Contrast  Result Date: 05/02/2017 CLINICAL DATA:  Alert and oriented.  Headache. EXAM: CT HEAD WITHOUT CONTRAST TECHNIQUE: Contiguous axial images were obtained from the base of the skull through the vertex without intravenous contrast. COMPARISON:  05/18/2008 FINDINGS: Brain: No evidence of acute infarction, hemorrhage, hydrocephalus, extra-axial collection or mass lesion/mass effect. Vascular: No hyperdense vessel or unexpected calcification. Skull: No osseous abnormality. Sinuses/Orbits: Visualized paranasal sinuses are clear. Visualized mastoid sinuses are clear. Visualized orbits demonstrate no focal abnormality. Other: None IMPRESSION: 1. No acute intracranial pathology. Electronically Signed   By: Kathreen Devoid   On: 05/02/2017 15:48   Mr Brain Wo Contrast  Result Date: 05/02/2017 CLINICAL DATA:  Thunderclap headache EXAM: MRI HEAD WITHOUT CONTRAST TECHNIQUE: Multiplanar, multiecho pulse  sequences of the brain and surrounding structures were obtained without intravenous contrast. COMPARISON:  Head CT 05/02/2017 Brain MRI 05/19/2008 FINDINGS: Brain: Partially empty sella. There is no focal diffusion restriction to indicate acute infarct. The brain parenchymal signal is normal and there is no mass lesion. No intraparenchymal hematoma or chronic microhemorrhage. Brain volume is normal for age without lobar predominant atrophy. The dura is normal and there is no extra-axial collection. Vascular: Major intracranial arterial and venous sinus flow voids are preserved. Skull and upper cervical spine: The visualized skull base, calvarium, upper cervical spine and extracranial soft tissues are normal.  Sinuses/Orbits: No fluid levels or advanced mucosal thickening. No mastoid or middle ear effusion. Normal orbits. IMPRESSION: Normal MRI of the brain for age. Electronically Signed   By: Ulyses Jarred M.D.   On: 05/02/2017 18:12    Assessment: 76 y.o. female with TIA manifesting as transient expressive aphasia in the setting of occipital headache 1. CT head negative 2. MRI brain negative.  3. Exam with normal language. No focal findings.  4. Stroke Risk Factors - dyslipidemia 5. DDx for presentation includes unusual presentation of complicated migraine in an elderly patient. However, TIA is more likely.  6. Echocardiogram result: Mild LVH with LVEF 65-70% and grade 1 diastolic dysfunction. Trivial mitral regurgitation. Trivial tricuspid regurgitation. No obvious PFO or ASD.  Plan: 1. Agree with starting ASA 2. MRA of the brain  3. PT consult, OT consult, Speech consult 4. BP management 5. Carotid dopplers done with report pending 6. Start atorvastatin 20 mg po qd. Obtain baseline CK level. 7. Telemetry monitoring 9. Frequent neuro checks  '@Electronically'  signed: Dr. Kerney Elbe 05/04/2017, 4:52 AM

## 2017-05-04 NOTE — Progress Notes (Signed)
PROGRESS NOTE   Cristina Sawyer  HGD:[id number] DOB: March 10, 1941 DOA: 05/02/2017 PCP: Denita Lung, MD    Brief Narrative:  Cristina Sawyer is a 76 y.o. female with medical history significant of CREST syndrome, HLD, GERD, and osteopenia who presented to the emergency department due to complaint of headache and new onset speech difficulty. She states that her headache started at her neck and radiated upward. It started yesterday and has progressively gotten worse. It got to be very severe today and she had a few minutes of word-finding difficulties, which prompted her to seek care. She had some blurry vision but no loss of vision, no chest pain or shortness of breath. She deals with allergies and has some congestion as well as dry cough. Admits to nausea and constipation but no vomiting. No dysuria. Her headache still persists but word finding difficulties have resolved. CT head completed, did not show any acute pathology. EDP spoke with Dr. Londell Moh with neurology. He recommended MRI given TIA symptom as well as new-onset headache. Differential diagnosis includes complex migraine versus TIA. Word finding difficulty and aphasia has resolved. MRI was negative for stroke but neurology recommended transfer to [facility] for further work up.   Patient has also been under a lot of stress recently. She has recently lost her brother, sister, and nephew and has also have had trouble sleeping at night.  Assessment & Plan:   Principal Problem:   Aphasia Active Problems:   GERD (gastroesophageal reflux disease)   Headache  Aphasia and headache versus atypical migraine  -Neurology saw patient and recommending MRA brain.  Added atorvastatin 20 mg daily, ECASA 81 mg, PT/OT/SLP eval, tele and neuro monitoring.  MRI brain -no acute abnormalities.    -Aphasia has resolved.    GERD -Pepcid prn   Allergies -Claritin, flonase   Insomnia -Ambien prn for sleep. She has recently lost 3 family members  within a few weeks of each other and has had trouble sleeping.   DVT prophylaxis: lovenox Code Status: full Family Communication: no family at bedside Disposition Plan: pending work up  Consultants:   Neurology  Procedures:   MRI brain completed.  MRA pending  Antimicrobials:  Anti-infectives    None     Subjective: Headache resolved now, speaking well, feels back to baseline.   Objective: Vitals:   05/04/17 0155 05/04/17 0620 05/04/17 0935 05/04/17 1428  BP: 125/81 139/72 137/67 135/63  Pulse: 76 62 76 78  Resp: 18 18 20 20   Temp: 97.7 F (36.5 C) 97.9 F (36.6 C) 98.5 F (36.9 C) 99 F (37.2 C)  TempSrc: Oral Oral Oral Oral  SpO2: 98% 98% 100% 98%  Weight:      Height:        Intake/Output Summary (Last 24 hours) at 05/04/17 1523 Last data filed at 05/04/17 0836  Gross per 24 hour  Intake              240 ml  Output                0 ml  Net              240 ml   Filed Weights   05/02/17 1438 05/02/17 1832 05/03/17 2218  Weight: 83.9 kg (185 lb) 85.1 kg (187 lb 9.6 oz) 85 kg (187 lb 8 oz)    Examination:  General exam: Appears calm and comfortable  Respiratory system: Clear to auscultation. Respiratory effort normal. Cardiovascular system: S1 & S2  heard, RRR. No JVD, murmurs, rubs, gallops or clicks. No pedal edema. Gastrointestinal system: Abdomen is nondistended, soft and nontender. No organomegaly or masses felt. Normal bowel sounds heard. Central nervous system: Alert and oriented. No focal neurological deficits. Extremities: Symmetric 5 x 5 power. Skin: No rashes, lesions or ulcers Psychiatry: Judgement and insight appear normal. Mood & affect appropriate.   Data Reviewed: I have personally reviewed following labs and imaging studies  CBC:  Recent Labs Lab 05/02/17 1508 05/02/17 1521 05/03/17 0345  WBC 9.9  --  10.0  NEUTROABS 6.2  --   --   HGB 14.8 15.3* 13.6  HCT 43.1 45.0 40.5  MCV 84.3  --  84.0  PLT 203  --  782   Basic  Metabolic Panel:  Recent Labs Lab 05/02/17 1508 05/02/17 1521 05/03/17 0345  NA 142 141 142  K 3.8 3.8 4.2  CL 107 105 108  CO2 25  --  26  GLUCOSE 105* 104* 91  BUN 13 13 13   CREATININE 1.02* 1.10* 0.94  CALCIUM 9.3  --  9.0   GFR: Estimated Creatinine Clearance: 53.7 mL/min (by C-G formula based on SCr of 0.94 mg/dL). Liver Function Tests:  Recent Labs Lab 05/02/17 1508  AST 19  ALT 15  ALKPHOS 79  BILITOT 0.8  PROT 7.4  ALBUMIN 4.2   No results for input(s): LIPASE, AMYLASE in the last 168 hours. No results for input(s): AMMONIA in the last 168 hours. Coagulation Profile:  Recent Labs Lab 05/02/17 1508  INR 0.97   Cardiac Enzymes:  Recent Labs Lab 05/04/17 1156  CKTOTAL 93   BNP (last 3 results) No results for input(s): PROBNP in the last 8760 hours. HbA1C:  Recent Labs  05/04/17 0720  HGBA1C 5.6   CBG: No results for input(s): GLUCAP in the last 168 hours. Lipid Profile:  Recent Labs  05/04/17 0720  CHOL 214*  HDL 70  LDLCALC 127*  TRIG 87  CHOLHDL 3.1   Thyroid Function Tests: No results for input(s): TSH, T4TOTAL, FREET4, T3FREE, THYROIDAB in the last 72 hours. Anemia Panel: No results for input(s): VITAMINB12, FOLATE, FERRITIN, TIBC, IRON, RETICCTPCT in the last 72 hours. Sepsis Labs: No results for input(s): PROCALCITON, LATICACIDVEN in the last 168 hours.  No results found for this or any previous visit (from the past 240 hour(s)).   Radiology Studies: Ct Head Wo Contrast  Result Date: 05/02/2017 CLINICAL DATA:  Alert and oriented.  Headache. EXAM: CT HEAD WITHOUT CONTRAST TECHNIQUE: Contiguous axial images were obtained from the base of the skull through the vertex without intravenous contrast. COMPARISON:  05/18/2008 FINDINGS: Brain: No evidence of acute infarction, hemorrhage, hydrocephalus, extra-axial collection or mass lesion/mass effect. Vascular: No hyperdense vessel or unexpected calcification. Skull: No osseous  abnormality. Sinuses/Orbits: Visualized paranasal sinuses are clear. Visualized mastoid sinuses are clear. Visualized orbits demonstrate no focal abnormality. Other: None IMPRESSION: 1. No acute intracranial pathology. Electronically Signed   By: Kathreen Devoid   On: 05/02/2017 15:48   Mr Brain Wo Contrast  Result Date: 05/02/2017 CLINICAL DATA:  Thunderclap headache EXAM: MRI HEAD WITHOUT CONTRAST TECHNIQUE: Multiplanar, multiecho pulse sequences of the brain and surrounding structures were obtained without intravenous contrast. COMPARISON:  Head CT 05/02/2017 Brain MRI 05/19/2008 FINDINGS: Brain: Partially empty sella. There is no focal diffusion restriction to indicate acute infarct. The brain parenchymal signal is normal and there is no mass lesion. No intraparenchymal hematoma or chronic microhemorrhage. Brain volume is normal for age without lobar predominant  atrophy. The dura is normal and there is no extra-axial collection. Vascular: Major intracranial arterial and venous sinus flow voids are preserved. Skull and upper cervical spine: The visualized skull base, calvarium, upper cervical spine and extracranial soft tissues are normal. Sinuses/Orbits: No fluid levels or advanced mucosal thickening. No mastoid or middle ear effusion. Normal orbits. IMPRESSION: Normal MRI of the brain for age. Electronically Signed   By: Ulyses Jarred M.D.   On: 05/02/2017 18:12   Scheduled Meds: . amLODipine  5 mg Oral Daily  . aspirin EC  81 mg Oral Daily  . atorvastatin  20 mg Oral q1800  . enoxaparin (LOVENOX) injection  40 mg Subcutaneous Q24H   Continuous Infusions:   LOS: 0 days   Time spent: 30 minutes   Irwin Brakeman, MD Pager [phone] [facility] [email or web address] Password [facility] 05/04/2017, 3:23 PM

## 2017-05-04 NOTE — Evaluation (Signed)
Physical Therapy Evaluation Patient Details Name: Cristina Sawyer MRN: [id number] DOB: 1941-06-05 Today's Date: 05/04/2017   History of Present Illness  Patient is a 76 y/o female who presents with headache, speech difficulties, and blurry vision. Head CT, MRI-unremarkable. PMH includes CREST syndrome, dyslipidemia, osteoporosis.   Clinical Impression  Patient presents close to functional baseline and able to tolerate gait training and stair training Mod I. Pt scored 22/24 on DGI indicating decreased fall risk. Pt independent PTA, drives and works part time. Reports all symptoms have resolved except minor pain in head. Education re: sign/symptoms of stroke. Pt does not require skilled therapy services. Discharge from therapy.    Follow Up Recommendations No PT follow up    Equipment Recommendations  None recommended by PT    Recommendations for Other Services       Precautions / Restrictions Precautions Precautions: None Restrictions Weight Bearing Restrictions: No      Mobility  Bed Mobility Overal bed mobility: Modified Independent             General bed mobility comments: No assist needed, no use of rail, HOB elevated.  Transfers Overall transfer level: Modified independent               General transfer comment: Stood from EOB x1, from couch x1.  Ambulation/Gait Ambulation/Gait assistance: Independent Ambulation Distance (Feet): 300 Feet Assistive device: None Gait Pattern/deviations: Step-through pattern;Drifts right/left   Gait velocity interpretation: at or above normal speed for age/gender General Gait Details: Steady gait with mild drifting during higher level balance challenges. See balance section. 2/4 DOE.  Stairs Stairs: Yes Stairs assistance: Modified independent (Device/Increase time) Stair Management: One rail Left;Alternating pattern Number of Stairs: 3 (+  2 steps x2 bouts) General stair comments: Steady and safe.  Wheelchair  Mobility    Modified Rankin (Stroke Patients Only) Modified Rankin (Stroke Patients Only) Pre-Morbid Rankin Score: No symptoms Modified Rankin: No symptoms     Balance Overall balance assessment: Needs assistance Sitting-balance support: Feet supported;No upper extremity supported Sitting balance-Leahy Scale: Normal     Standing balance support: During functional activity Standing balance-Leahy Scale: Good               High level balance activites: Backward walking;Direction changes;Turns;Sudden stops;Head turns High Level Balance Comments: Tolerated above with only mild drifting during head turns, otherwise no deviations noted.  Standardized Balance Assessment Standardized Balance Assessment : Dynamic Gait Index   Dynamic Gait Index Level Surface: Normal Change in Gait Speed: Normal Gait with Horizontal Head Turns: Mild Impairment Gait with Vertical Head Turns: Normal Gait and Pivot Turn: Normal Step Over Obstacle: Normal Step Around Obstacles: Normal Steps: Mild Impairment Total Score: 22       Pertinent Vitals/Pain Pain Assessment: 0-10 Pain Score: 1  Pain Location: head Pain Descriptors / Indicators: Headache Pain Intervention(s): Monitored during session    Home Living Family/patient expects to be discharged to:: Private residence Living Arrangements: Alone Available Help at Discharge: Friend(s);Available PRN/intermittently Type of Home: House Home Access: Stairs to enter   [employer] of Steps: 2 Home Layout: One level Home Equipment: None      Prior Function Level of Independence: Independent         Comments: Work part time in an office. lots of recent stressors- multiple family members died within 1 week of each other. Drives.      Hand Dominance        Extremity/Trunk Assessment   Upper Extremity Assessment Upper Extremity Assessment:  Defer to OT evaluation;Overall WFL for tasks assessed    Lower Extremity  Assessment Lower Extremity Assessment: Overall WFL for tasks assessed    Cervical / Trunk Assessment Cervical / Trunk Assessment: Normal  Communication   Communication: No difficulties  Cognition Arousal/Alertness: Awake/alert Behavior During Therapy: WFL for tasks assessed/performed Overall Cognitive Status: Within Functional Limits for tasks assessed                                        General Comments      Exercises     Assessment/Plan    PT Assessment Patent does not need any further PT services  PT Problem List         PT Treatment Interventions      PT Goals (Current goals can be found in the Care Plan section)  Acute Rehab PT Goals Patient Stated Goal: to go to the beach PT Goal Formulation: All assessment and education complete, DC therapy    Frequency     Barriers to discharge        Co-evaluation               AM-PAC PT "6 Clicks" Daily Activity  Outcome Measure Difficulty turning over in bed (including adjusting bedclothes, sheets and blankets)?: None Difficulty moving from lying on back to sitting on the side of the bed? : None Difficulty sitting down on and standing up from a chair with arms (e.g., wheelchair, bedside commode, etc,.)?: None Help needed moving to and from a bed to chair (including a wheelchair)?: None Help needed walking in hospital room?: None Help needed climbing 3-5 steps with a railing? : A Little 6 Click Score: 23    End of Session Equipment Utilized During Treatment: Gait belt Activity Tolerance: Patient tolerated treatment well Patient left: in chair;with call bell/phone within reach Nurse Communication: Mobility status PT Visit Diagnosis: Unsteadiness on feet (R26.81)    Time: 1323-1340 PT Time Calculation (min) (ACUTE ONLY): 17 min   Charges:   PT Evaluation $PT Eval Low Complexity: 1 Low     PT G Codes:   PT G-Codes **NOT FOR INPATIENT CLASS** Functional Assessment Tool Used: Clinical  judgement Functional Limitation: Mobility: Walking and moving around Mobility: Walking and Moving Around Current Status (L9379): At least 1 percent but less than 20 percent impaired, limited or restricted Mobility: Walking and Moving Around Goal Status [phone]): At least 1 percent but less than 20 percent impaired, limited or restricted Mobility: Walking and Moving Around Discharge Status [phone]): At least 1 percent but less than 20 percent impaired, limited or restricted    [place], [place], [place] [phone]    Lacie Draft 05/04/2017, 1:46 PM

## 2017-05-04 NOTE — Progress Notes (Addendum)
Admitted for headache, episode of speech arrest. Patient likely has complicated migraine vs TIA. Most of her stroke workup done at [facility] long. ( Echo shows no thrombus, Ef 65pc,mild TR) . Carotid shows no significant stenosis. Aic is 5.6 and LDl 127. MRI Brain showed no stroke.  Called MRI at 4 pm to expedite MRA and ordered as stat - however still not complete at the time of this note. IF MR Angiogram is negative for significant stenosis,and telemetry is negative patient can be discharged. Discharge on Asa 81mg , Atorvastatin 40 mg for stroke prevention. Follow up with Neurology 4-6 weeks.   Attempted paging primary team around 6.30 to notify plan, unfortunately missed call back.   If any questions please page Dr. Cheral Marker from [place] or Dr. Leonie Man tomorrow.     Karena Addison Lucien Budney MD [facility] [phone]  If 7pm to 7am, please call on call as listed on AMION.

## 2017-05-05 ENCOUNTER — Observation Stay (HOSPITAL_COMMUNITY): Payer: Medicare HMO

## 2017-05-05 DIAGNOSIS — R4701 Aphasia: Secondary | ICD-10-CM | POA: Diagnosis not present

## 2017-05-05 DIAGNOSIS — R51 Headache: Secondary | ICD-10-CM | POA: Diagnosis not present

## 2017-05-05 DIAGNOSIS — K210 Gastro-esophageal reflux disease with esophagitis: Secondary | ICD-10-CM | POA: Diagnosis not present

## 2017-05-05 MED ORDER — AMLODIPINE BESYLATE 5 MG PO TABS: 5.0000 mg | ORAL_TABLET | Freq: Every day | ORAL | 0 refills | Status: DC

## 2017-05-05 MED ORDER — ATORVASTATIN CALCIUM 40 MG PO TABS: 40.0000 mg | ORAL_TABLET | Freq: Every day | ORAL | 0 refills | Status: DC

## 2017-05-05 MED ORDER — ASPIRIN 81 MG PO TBEC: 81.0000 mg | DELAYED_RELEASE_TABLET | Freq: Every day | ORAL | 0 refills | Status: AC

## 2017-05-05 NOTE — Discharge Summary (Signed)
Physician Discharge Summary  DELITA CHIQUITO NTZ:[id number] DOB: 1941/04/29 DOA: 05/02/2017  PCP: Denita Lung, MD  Admit date: 05/02/2017 Discharge date: 05/05/2017  Admitted From: HOME  Disposition: HOME   Recommendations for Outpatient Follow-up:  1. Follow up with PCP in 1 weeks 2. Follow up with neurologist in 4-6 weeks for recheck. 3. Please obtain BMP/CBC in one week  Discharge Condition: STABLE   CODE STATUS: FULL    Brief Hospitalization Summary: Please see all hospital notes, images, labs for full details of the hospitalization.   HPI: Cristina Sawyer is a 76 y.o. female with medical history significant of CREST syndrome, HLD, GERD, and osteopenia who presented to the emergency department due to complaint of headache and new onset speech difficulty. She states that her headache started at her neck and radiated upward. It started yesterday and has progressively gotten worse. It got to be very severe today and she had a few minutes of word-finding difficulties, which prompted her to seek care. She had some blurry vision but no loss of vision, no chest pain or shortness of breath. She deals with allergies and has some congestion as well as dry cough. Admits to nausea and constipation but no vomiting. No dysuria. Her headache still persists but word finding difficulties have resolved.   ED Course: CT head completed, did not show any acute pathology. EDP spoke with Dr. Londell Moh with neurology. He recommended MRI given TIA symptom as well as new-onset headache. Differential diagnosis includes complex migraine versus TIA. Word finding difficulty and aphasia has resolved.  Principal Problem:   Aphasia Active Problems:   GERD (gastroesophageal reflux disease)   Headache  Aphasia and headache versus atypical migraine  -Neurology saw patient and recommending MRA brain which was negative. Recommended  atorvastatin 40 mg daily, ECASA 81 mg, eval, tele and neuro monitoring within normal  limits.  MRI brain -no acute abnormalities.  Pt will follow up with neurology in 4-6 weeks.  [facility] referral made at discharge.   Aphasia has resolved.    Essential Hypertension - amlodipine 5 mg po daily.    GERD -Pepcid prn   Allergies -Claritin, flonase   Insomnia -Ambien prn for sleep. She has recently lost 3 family members within a few weeks of each other and has had trouble sleeping.   DVT prophylaxis: lovenox Code Status: full Family Communication: no family at bedside Disposition Plan: pending work up  Consultants:   Neurology  Procedures:   MRI brain completed.  MRA pending  Antimicrobials:      Anti-infectives     None     Discharge Diagnoses:  Principal Problem:   Aphasia Active Problems:   GERD (gastroesophageal reflux disease)   Headache  Discharge Instructions: Discharge Instructions    Ambulatory referral to Neurology    Complete by:  As directed    An appointment is requested in approximately: 4 weeks   Call MD for:  extreme fatigue    Complete by:  As directed    Call MD for:  hives    Complete by:  As directed    Call MD for:  persistant dizziness or light-headedness    Complete by:  As directed    Call MD for:  persistant nausea and vomiting    Complete by:  As directed    Call MD for:  severe uncontrolled pain    Complete by:  As directed    Call MD for:  temperature >100.4    Complete by:  As directed    Diet - low sodium heart healthy    Complete by:  As directed    Increase activity slowly    Complete by:  As directed      Allergies as of 05/05/2017      Reactions   Codeine    REACTION: itch/nausea   Ciprofloxacin Hcl Rash      Medication List    TAKE these medications   amLODipine 5 MG tablet Commonly known as:  NORVASC Take 1 tablet (5 mg total) by mouth daily.   aspirin 81 MG EC tablet Take 1 tablet (81 mg total) by mouth daily.   atorvastatin 40 MG tablet Commonly known  as:  LIPITOR Take 1 tablet (40 mg total) by mouth daily at 6 PM.   cetirizine 10 MG tablet Commonly known as:  ZYRTEC Take 10 mg by mouth daily as needed for allergies.   CORICIDIN D COLD/FLU/SINUS 2-5-325 MG Tabs Generic drug:  Chlorphen-Phenyleph-APAP Take 1 tablet by mouth daily as needed (allergies).   ranitidine 75 MG tablet Commonly known as:  ZANTAC Take 75 mg by mouth 2 (two) times daily as needed for heartburn.      Follow-up Information    [facility]. Schedule an appointment as soon as possible for a visit in 4 week(s).   Why:  Hospital Follow Up  Contact information: [address]     [address] [place] [address] [phone]       Denita Lung, MD. Schedule an appointment as soon as possible for a visit in 1 week(s).   Specialty:  Family Medicine Why:  Hospital Follow Up  Contact information: [address] [place] [address] [phone]          Allergies  Allergen Reactions  . Codeine     REACTION: itch/nausea  . Ciprofloxacin Hcl Rash   Current Discharge Medication List    START taking these medications   Details  amLODipine (NORVASC) 5 MG tablet Take 1 tablet (5 mg total) by mouth daily. Qty: 30 tablet, Refills: 0    aspirin EC 81 MG EC tablet Take 1 tablet (81 mg total) by mouth daily. Qty: 30 tablet, Refills: 0    atorvastatin (LIPITOR) 40 MG tablet Take 1 tablet (40 mg total) by mouth daily at 6 PM. Qty: 30 tablet, Refills: 0      CONTINUE these medications which have NOT CHANGED   Details  Chlorphen-Phenyleph-APAP (CORICIDIN D COLD/FLU/SINUS) 2-5-325 MG TABS Take 1 tablet by mouth daily as needed (allergies).    cetirizine (ZYRTEC) 10 MG tablet Take 10 mg by mouth daily as needed for allergies.     ranitidine (ZANTAC) 75 MG tablet Take 75 mg by mouth 2 (two) times daily as needed for heartburn.        Procedures/Studies: Ct Head Wo Contrast  Result Date:  05/02/2017 CLINICAL DATA:  Alert and oriented.  Headache. EXAM: CT HEAD WITHOUT CONTRAST TECHNIQUE: Contiguous axial images were obtained from the base of the skull through the vertex without intravenous contrast. COMPARISON:  05/18/2008 FINDINGS: Brain: No evidence of acute infarction, hemorrhage, hydrocephalus, extra-axial collection or mass lesion/mass effect. Vascular: No hyperdense vessel or unexpected calcification. Skull: No osseous abnormality. Sinuses/Orbits: Visualized paranasal sinuses are clear. Visualized mastoid sinuses are clear. Visualized orbits demonstrate no focal abnormality. Other: None IMPRESSION: 1. No acute intracranial pathology. Electronically Signed   By: Kathreen Devoid   On: 05/02/2017 15:48   Mr [facility] Wo Contrast  Result Date: 05/05/2017 CLINICAL DATA:  76 year old female with recent occipital headache, neck pain. Episode of speech arrest. EXAM: MRA HEAD WITHOUT CONTRAST TECHNIQUE: Angiographic images of the Circle of Willis were obtained using MRA technique without intravenous contrast. COMPARISON:  Brain MRI 05/02/2017.  Intracranial MRA 05/19/2008. FINDINGS: No intracranial mass effect is evident.  No ventriculomegaly. Antegrade flow in the posterior circulation appears stable since 2009. Codominant distal vertebral arteries. PICA origins appear patent. Patent vertebrobasilar junction. Basilar artery, AICA, SCA and PCA origins appear stable and normal. Posterior communicating arteries are diminutive or absent. Bilateral PCAs are stable and within normal limits. Antegrade flow in both ICA siphons appear stable since 2009. No siphon stenosis. Normal ophthalmic artery origins. Normal carotid termini. Normal MCA and ACA origins. Anterior communicating artery and visible ACA branches are within normal limits. MCA M1 segments appear stable and normal. Visible bilateral MCA branches are stable and within normal limits. IMPRESSION: Negative intracranial MRA, stable since 2009.  Electronically Signed   By: Genevie Ann M.D.   On: 05/05/2017 07:26   Mr Brain Wo Contrast  Result Date: 05/02/2017 CLINICAL DATA:  Thunderclap headache EXAM: MRI HEAD WITHOUT CONTRAST TECHNIQUE: Multiplanar, multiecho pulse sequences of the brain and surrounding structures were obtained without intravenous contrast. COMPARISON:  Head CT 05/02/2017 Brain MRI 05/19/2008 FINDINGS: Brain: Partially empty sella. There is no focal diffusion restriction to indicate acute infarct. The brain parenchymal signal is normal and there is no mass lesion. No intraparenchymal hematoma or chronic microhemorrhage. Brain volume is normal for age without lobar predominant atrophy. The dura is normal and there is no extra-axial collection. Vascular: Major intracranial arterial and venous sinus flow voids are preserved. Skull and upper cervical spine: The visualized skull base, calvarium, upper cervical spine and extracranial soft tissues are normal. Sinuses/Orbits: No fluid levels or advanced mucosal thickening. No mastoid or middle ear effusion. Normal orbits. IMPRESSION: Normal MRI of the brain for age. Electronically Signed   By: Ulyses Jarred M.D.   On: 05/02/2017 18:12     Subjective: Pt says she is feeling so much better, headache resolved, and wants to go home.    Discharge Exam: Vitals:   05/05/17 0600 05/05/17 1013  BP: (!) 123/55 (!) 113/57  Pulse: 63 70  Resp: 16 18  Temp: 97.8 F (36.6 C) 98.4 F (36.9 C)  SpO2: 96% 98%   Vitals:   05/04/17 2120 05/05/17 0200 05/05/17 0600 05/05/17 1013  BP: (!) 134/53 (!) 145/70 (!) 123/55 (!) 113/57  Pulse: 73 71 63 70  Resp: 18 18 16 18   Temp: 98 F (36.7 C) 97.8 F (36.6 C) 97.8 F (36.6 C) 98.4 F (36.9 C)  TempSrc: Oral Oral Oral Oral  SpO2: 96% 98% 96% 98%  Weight:      Height:       General: Pt is alert, awake, not in acute distress Cardiovascular: RRR, S1/S2 +, no rubs, no gallops Respiratory: CTA bilaterally, no wheezing, no rhonchi Abdominal:  Soft, NT, ND, bowel sounds + Extremities: no edema, no cyanosis Neurological: nonfocal.    The results of significant diagnostics from this hospitalization (including imaging, microbiology, ancillary and laboratory) are listed below for reference.     Microbiology: No results found for this or any previous visit (from the past 240 hour(s)).   Labs: BNP (last 3 results) No results for input(s): BNP in the last 8760 hours. Basic Metabolic Panel:  Recent Labs Lab 05/02/17 1508 05/02/17 1521 05/03/17 0345  NA 142 141 142  K 3.8 3.8 4.2  CL 107 105 108  CO2 25  --  26  GLUCOSE 105* 104* 91  BUN 13 13 13   CREATININE 1.02* 1.10* 0.94  CALCIUM 9.3  --  9.0   Liver Function Tests:  Recent Labs Lab 05/02/17 1508  AST 19  ALT 15  ALKPHOS 79  BILITOT 0.8  PROT 7.4  ALBUMIN 4.2   No results for input(s): LIPASE, AMYLASE in the last 168 hours. No results for input(s): AMMONIA in the last 168 hours. CBC:  Recent Labs Lab 05/02/17 1508 05/02/17 1521 05/03/17 0345  WBC 9.9  --  10.0  NEUTROABS 6.2  --   --   HGB 14.8 15.3* 13.6  HCT 43.1 45.0 40.5  MCV 84.3  --  84.0  PLT 203  --  195   Cardiac Enzymes:  Recent Labs Lab 05/04/17 1156  CKTOTAL 93   BNP: Invalid input(s): POCBNP CBG: No results for input(s): GLUCAP in the last 168 hours. D-Dimer No results for input(s): DDIMER in the last 72 hours. Hgb A1c  Recent Labs  05/04/17 0720  HGBA1C 5.6   Lipid Profile  Recent Labs  05/04/17 0720  CHOL 214*  HDL 70  LDLCALC 127*  TRIG 87  CHOLHDL 3.1   Thyroid function studies No results for input(s): TSH, T4TOTAL, T3FREE, THYROIDAB in the last 72 hours.  Invalid input(s): FREET3 Anemia work up No results for input(s): VITAMINB12, FOLATE, FERRITIN, TIBC, IRON, RETICCTPCT in the last 72 hours. Urinalysis    Component Value Date/Time   COLORURINE STRAW (A) 03/04/2011 1342   APPEARANCEUR CLEAR 03/04/2011 1342   LABSPEC 1.005 03/04/2011 1342    PHURINE 6.0 03/04/2011 1342   GLUCOSEU NEGATIVE 03/04/2011 1342   HGBUR NEGATIVE 03/04/2011 1342   BILIRUBINUR n 04/22/2016 1558   KETONESUR NEGATIVE 03/04/2011 1342   PROTEINUR n 04/22/2016 1558   PROTEINUR NEGATIVE 03/04/2011 1342   UROBILINOGEN negative 04/22/2016 1558   UROBILINOGEN 0.2 03/04/2011 1342   NITRITE n 04/22/2016 1558   NITRITE NEGATIVE 03/04/2011 1342   LEUKOCYTESUR Negative 04/22/2016 1558   Sepsis Labs Invalid input(s): PROCALCITONIN,  WBC,  LACTICIDVEN Microbiology No results found for this or any previous visit (from the past 240 hour(s)).  Time coordinating discharge:   SIGNED:  Irwin Brakeman, MD  [facility] 05/05/2017, 10:40 AM Pager [phone]  If 7PM-7AM, please contact night-coverage [email or web address] Password [email or web address]

## 2017-05-05 NOTE — Discharge Instructions (Signed)
Follow with Primary MD  Denita Lung, MD  and other consultant's as instructed your Hospitalist MD  Please get a complete blood count and chemistry panel checked by your Primary MD at your next visit, and again as instructed by your Primary MD.  Get Medicines reviewed and adjusted: Please take all your medications with you for your next visit with your Primary MD  Laboratory/radiological data: Please request your Primary MD to go over all hospital tests and procedure/radiological results at the follow up, please ask your Primary MD to get all Hospital records sent to his/her office.  In some cases, they will be blood work, cultures and biopsy results pending at the time of your discharge. Please request that your primary care M.D. follows up on these results.  Also Note the following: If you experience worsening of your admission symptoms, develop shortness of breath, life threatening emergency, suicidal or homicidal thoughts you must seek medical attention immediately by calling 911 or calling your MD immediately  if symptoms less severe.  You must read complete instructions/literature along with all the possible adverse reactions/side effects for all the Medicines you take and that have been prescribed to you. Take any new Medicines after you have completely understood and accpet all the possible adverse reactions/side effects.   Do not drive when taking Pain medications or sleeping medications (Benzodaizepines)  Do not take more than prescribed Pain, Sleep and Anxiety Medications. It is not advisable to combine anxiety,sleep and pain medications without talking with your primary care practitioner  Special Instructions: If you have smoked or chewed Tobacco  in the last 2 yrs please stop smoking, stop any regular Alcohol  and or any Recreational drug use.  Wear Seat belts while driving.  Please note: You were cared for by a hospitalist during your hospital stay. Once you are discharged,  your primary care physician will handle any further medical issues. Please note that NO REFILLS for any discharge medications will be authorized once you are discharged, as it is imperative that you return to your primary care physician (or establish a relationship with a primary care physician if you do not have one) for your post hospital discharge needs so that they can reassess your need for medications and monitor your lab values.

## 2017-05-05 NOTE — Progress Notes (Signed)
Pt d/c to home alert and oriented no new complains, d/c instructions done with teach back, pt verbalize understanding.

## 2017-05-05 NOTE — Progress Notes (Signed)
Patient ambulated to bathroom and back to bed with no difficulties. Patient administered 0.5 mg IV ativan prior to transport to radiology for repeat MRI.

## 2017-05-05 NOTE — Care Management Obs Status (Signed)
MEDICARE OBSERVATION STATUS NOTIFICATION   Patient Details  Name: Cristina Sawyer MRN: [id number] Date of Birth: Jun 10, 1941   Medicare Observation Status Notification Given:  Yes    Pollie Friar, RN 05/05/2017, 11:58 AM

## 2017-05-06 ENCOUNTER — Telehealth: Payer: Self-pay | Admitting: Family Medicine

## 2017-05-06 NOTE — Telephone Encounter (Signed)
Pt was called concerning recent hospital stay. Pt states she is feeling a lot better. Current and discharge meds were reconciled. Pt was placed on some new medications during her hospital stay. She is on Norvasc, Lipitor and a aspirin. She does have those filled and at home. All other meds stayed the same and those are as needed. She did state that the Zantac that she takes as needed recently has become everyday. Pt made an appt for 05/12/2017. Pt was informed to bring all medications with her and she verbally confirmed that she would. After hour/weekend protocol was discussed and she verbalized understanding. Pt was also encouraged to call [facility] to make an appt as they are sometimes booked. Pt stated she would.

## 2017-05-12 ENCOUNTER — Encounter: Payer: Self-pay | Admitting: Family Medicine

## 2017-05-12 ENCOUNTER — Ambulatory Visit (INDEPENDENT_AMBULATORY_CARE_PROVIDER_SITE_OTHER): Payer: Medicare HMO | Admitting: Family Medicine

## 2017-05-12 VITALS — BP 120/70 | HR 68 | Ht 64.0 in | Wt 187.0 lb

## 2017-05-12 DIAGNOSIS — Z23 Encounter for immunization: Secondary | ICD-10-CM | POA: Diagnosis not present

## 2017-05-12 DIAGNOSIS — M79644 Pain in right finger(s): Secondary | ICD-10-CM

## 2017-05-12 DIAGNOSIS — I10 Essential (primary) hypertension: Secondary | ICD-10-CM

## 2017-05-12 DIAGNOSIS — R519 Headache, unspecified: Secondary | ICD-10-CM

## 2017-05-12 DIAGNOSIS — G459 Transient cerebral ischemic attack, unspecified: Secondary | ICD-10-CM | POA: Diagnosis not present

## 2017-05-12 DIAGNOSIS — M25449 Effusion, unspecified hand: Secondary | ICD-10-CM | POA: Diagnosis not present

## 2017-05-12 DIAGNOSIS — R51 Headache: Secondary | ICD-10-CM

## 2017-05-12 LAB — CBC WITH DIFFERENTIAL/PLATELET
BASOS ABS: 0 {cells}/uL (ref 0–200)
Basophils Relative: 0 %
EOS ABS: 108 {cells}/uL (ref 15–500)
Eosinophils Relative: 1 %
HCT: 45.3 % — ABNORMAL HIGH (ref 35.0–45.0)
HEMOGLOBIN: 15.2 g/dL (ref 11.7–15.5)
LYMPHS ABS: 2916 {cells}/uL (ref 850–3900)
Lymphocytes Relative: 27 %
MCH: 28.3 pg (ref 27.0–33.0)
MCHC: 33.6 g/dL (ref 32.0–36.0)
MCV: 84.2 fL (ref 80.0–100.0)
MONOS PCT: 6 %
MPV: 10.7 fL (ref 7.5–12.5)
Monocytes Absolute: 648 cells/uL (ref 200–950)
NEUTROS ABS: 7128 {cells}/uL (ref 1500–7800)
NEUTROS PCT: 66 %
Platelets: 243 10*3/uL (ref 140–400)
RBC: 5.38 MIL/uL — ABNORMAL HIGH (ref 3.80–5.10)
RDW: 14.3 % (ref 11.0–15.0)
WBC: 10.8 10*3/uL — ABNORMAL HIGH (ref 4.0–10.5)

## 2017-05-12 MED ORDER — AMLODIPINE BESYLATE 5 MG PO TABS: 5.0000 mg | ORAL_TABLET | Freq: Every day | ORAL | 3 refills | Status: DC

## 2017-05-12 MED ORDER — ROSUVASTATIN CALCIUM 5 MG PO TABS: 5.0000 mg | ORAL_TABLET | Freq: Every day | ORAL | 3 refills | Status: DC

## 2017-05-12 NOTE — Patient Instructions (Signed)
Do 20 minutes of heat 3 times a day for your neck and stretch gently afterwards. Take 2 Aleve twice a day for your head and your finger

## 2017-05-12 NOTE — Progress Notes (Signed)
   Subjective:    Patient ID: Cristina Sawyer, female    DOB: 19-Mar-1941, 76 y.o.   MRN: [id number]  HPI She is here for follow-up after recent hospitalization and evaluation for difficulty with headache as well as speech difficulty. Record was reviewed and did show a negative CT, MRI, MRA. Initially she had difficulty with cleaning of headache, blurred vision and speech difficulty mainly with word searching. Her symptoms did go away except for her headache. Further history the headaches says that this actually started a day or 2 prior to the other neurologic symptoms and is in the occipital area mainly on the left. She states that the headache is still present but to much lesser degree. She was placed on atorvastatin and Lipitor but the Lipitor causes difficulty with leg aches and pains and she stopped it 2 days ago. She also notes difficulty in the last day of her right second MCP pain and swelling. No other joints are involved. She has had no difficulty with blurred vision, weakness, numbness, tingling or falling. Review of Systems     Objective:   Physical Exam Alert and in no distress. Cardiac and lung exam normal. Exam of the right index finger at the MCP joint does show pain and swelling of the joint but no other joints in the hand wrist or elbow noted bilaterally. Knees are normal with no effusion.       Assessment & Plan:  Transient cerebral ischemia, unspecified type - Plan: rosuvastatin (CRESTOR) 5 MG tablet, CBC with Differential/Platelet, Comprehensive metabolic panel  Need for influenza vaccination - Plan: Flu vaccine HIGH DOSE PF (Fluzone High dose)  Nonintractable headache, unspecified chronicity pattern, unspecified headache type - Plan: CBC with Differential/Platelet, Comprehensive metabolic panel  Finger pain, right - Plan: CBC with Differential/Platelet, Comprehensive metabolic panel  Finger joint effusion - Plan: CBC with Differential/Platelet, Comprehensive metabolic  panel  Essential hypertension - Plan: amLODipine (NORVASC) 5 MG tablet She was statin intolerance with atorvastatin and I will therefore switch her to Crestor. Explained the mechanism of placing her on this with her underlying neurologic history. Also continue her on her amlodipine as a review of her record does indicate that her blood pressure over the last several readings was elevated. Recommend she use 2 Aleve twice per day to both treat the neck discomfort as well as the hand. We'll follow-up on hand as needed. Check here in 2 months. Also encouraged her to continue on the aspirin. Flu shot given today.

## 2017-05-13 LAB — COMPREHENSIVE METABOLIC PANEL
ALBUMIN: 4.8 g/dL (ref 3.6–5.1)
ALT: 23 U/L (ref 6–29)
AST: 17 U/L (ref 10–35)
Alkaline Phosphatase: 96 U/L (ref 33–130)
BUN: 17 mg/dL (ref 7–25)
CHLORIDE: 103 mmol/L (ref 98–110)
CO2: 22 mmol/L (ref 20–32)
Calcium: 9.8 mg/dL (ref 8.6–10.4)
Creat: 0.99 mg/dL — ABNORMAL HIGH (ref 0.60–0.93)
Glucose, Bld: 102 mg/dL — ABNORMAL HIGH (ref 65–99)
Potassium: 4.1 mmol/L (ref 3.5–5.3)
Sodium: 140 mmol/L (ref 135–146)
TOTAL PROTEIN: 7.5 g/dL (ref 6.1–8.1)
Total Bilirubin: 0.6 mg/dL (ref 0.2–1.2)

## 2017-06-19 ENCOUNTER — Encounter: Payer: Self-pay | Admitting: Neurology

## 2017-06-19 ENCOUNTER — Ambulatory Visit (INDEPENDENT_AMBULATORY_CARE_PROVIDER_SITE_OTHER): Payer: Medicare HMO | Admitting: Neurology

## 2017-06-19 VITALS — BP 147/73 | HR 90 | Ht 64.0 in | Wt 190.0 lb

## 2017-06-19 DIAGNOSIS — R4789 Other speech disturbances: Secondary | ICD-10-CM | POA: Diagnosis not present

## 2017-06-19 NOTE — Progress Notes (Signed)
Subjective:    Patient ID: Cristina Sawyer is a 76 y.o. female.  HPI     Star Age, MD, PhD [facility] [address], [address] [place], [place] [address]  I saw Cristina Sawyer as a referral from the hospital after recent admission for word finding difficulties. The patient is unaccompanied today. Cristina Sawyer is a 76 year old right-handed woman with an underlying medical history of CREST (some 15 or 20 years ago per pt, not followed by rheum), hyperlipidemia, reflux disease, osteopenia, and obesity, who presented to the emergency room in August with new onset speech difficulty as well as headache radiating from the neck up toward. She also has some blurry vision. In the emergency room her headache was still ongoing but speech difficulty had essentially resolved. In the emergency room she had a head CT which was negative for any acute pathology. She was admitted for completion of TIA/stroke workup. She was started on Lipitor 40 mg daily, baby aspirin, had MRI and MRA head. Her MRA head from 05/05/2017 showed: IMPRESSION: Negative intracranial MRA, stable since 2009. MRI brain without contrast on 05/02/2017 showed: IMPRESSION: Normal MRI of the brain for age.   She had an echocardiogram on 05/03/2017: Impressions:   - Mild LVH with LVEF 65-70% and grade 1 diastolic dysfunction.   Trivial mitral regurgitation. Trivial tricuspid regurgitation. No   obvious PFO or ASD.  She had carotid Doppler ultrasound testing on 05/03/2017: Summary:  - The vertebral arteries appear patent with antegrade flow. - Findings consistent with a 1-39 percent stenosis involving the   right internal carotid artery and the left internal carotid   artery. Difficult evaluation of plaque bilaterally due to depth   of vessels and acoustic shadowing.   She was switched recently to Crestor 5 mg per PCP, could not tolerate the generic Lipitor. She is still on ASA 81 mg. She has a  remote history of infrequent smoking for about 5 years.  She had a HA for about a week before the speech issue started suddenly, this lasted only about a few minutes. She caught herself garbling speech. Headache was on the milder side. She does not have telltale symptoms of migraines. She does report significant amount of stress preceding this incident. She had lost several family members with a months time. She is widowed and lives alone. She has no children. She recently retired. 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 She does not always exercise on a regular basis. She was drinking up to 4 bottles of water, 16.9 ounces each. Her LDL was 127 on 05/04/17 and A1c was good at 5.6. Feels at baseline. Denies any history of apneas, denies any history of waking up with a sense of gasping or choking.  Her Past Medical History Is Significant For: Past Medical History:  Diagnosis Date  . Arthritis   . Cataract   . CREST syndrome ([place])    pt denies (saw rheum)  . Diverticulosis   . Dyslipidemia   . GERD (gastroesophageal reflux disease)   . Osteoporosis    OSTEOPENIA  . Personal history of colonic adenoma 08/09/2008  . Vitamin D deficiency     Her Past Surgical History Is Significant For: Past Surgical History:  Procedure Laterality Date  . ABDOMINAL HYSTERECTOMY  1975  . APPENDECTOMY  1975  . COLONOSCOPY    . thyroid nodule  2000    Her Family  History Is Significant For: Family History  Problem Relation Age of Onset  . Colon cancer Neg Hx     Her Social History Is Significant For: Social History   Social History  . Marital status: Widowed    Spouse name: N/A  . Number of children: N/A  . Years of education: N/A   Social History Main Topics  . Smoking status: Never Smoker  . Smokeless tobacco: Never Used  . Alcohol use No  . Drug use: No  . Sexual activity: Yes   Other Topics Concern  . None   Social History  Narrative  . None    Her Allergies Are:  Allergies  Allergen Reactions  . Codeine     REACTION: itch/nausea  . Ciprofloxacin Hcl Rash  :   Her Current Medications Are:  Outpatient Encounter Prescriptions as of 06/19/2017  Medication Sig  . amLODipine (NORVASC) 5 MG tablet Take 1 tablet (5 mg total) by mouth daily.  Marland Kitchen aspirin EC 81 MG tablet Take 81 mg by mouth daily.  Marland Kitchen atorvastatin (LIPITOR) 40 MG tablet Take 40 mg by mouth daily.  . cetirizine (ZYRTEC) 10 MG tablet Take 10 mg by mouth daily as needed for allergies.   . Chlorphen-Phenyleph-APAP (CORICIDIN D COLD/FLU/SINUS) 2-5-325 MG TABS Take 1 tablet by mouth daily as needed (allergies).  . ranitidine (ZANTAC) 75 MG tablet Take 75 mg by mouth 2 (two) times daily as needed for heartburn.   . rosuvastatin (CRESTOR) 5 MG tablet Take 1 tablet (5 mg total) by mouth daily.   No facility-administered encounter medications on file as of 06/19/2017.   : Review of Systems:  Out of a complete 14 point review of systems, all are reviewed and negative with the exception of these symptoms as listed below:   Review of Systems  Neurological:       Pt presents today to discuss a possible TIA. Pt had an episode of word finding difficulty. Pt had MRI/MRA studies during hospital visit.   Objective:  Neurological Exam  Physical Exam Physical Examination:   Vitals:   06/19/17 1422  BP: (!) 147/73  Pulse: 90   General Examination: The patient is a very pleasant 75 y.o. female in no acute distress. She appears well-developed and well-nourished and well groomed.   HEENT: Normocephalic, atraumatic, pupils are equal, round and reactive to light and accommodation. Extraocular tracking is good without limitation to gaze excursion or nystagmus noted. Normal smooth pursuit is noted. Hearing is grossly intact. Tympanic membranes are clear bilaterally. Face is symmetric with normal facial animation and normal facial sensation. Speech is clear with no  dysarthria noted. There is no hypophonia. There is no lip, neck/head, jaw or voice tremor. Neck is supple with full range of passive and active motion. There are no carotid bruits on auscultation. Oropharynx exam reveals: mild mouth dryness, adequate dental hygiene and mild airway crowding, due to redundant. Mallampati is class II. Tongue protrudes centrally and palate elevates symmetrically.   Chest: Clear to auscultation without wheezing, rhonchi or crackles noted.  Heart: S1+S2+0, regular and normal without murmurs, rubs or gallops noted.   Abdomen: Soft, non-tender and non-distended with normal bowel sounds appreciated on auscultation.  Extremities: There is no pitting edema in the distal lower extremities bilaterally. Pedal pulses are intact.  Skin: Warm and dry without trophic changes noted. There are no varicose veins.  Musculoskeletal: exam reveals no obvious joint deformities, tenderness or joint swelling or erythema.   Neurologically:  Mental status: The  patient is awake, alert and oriented in all 4 spheres. Her immediate and remote memory, attention, language skills and fund of knowledge are appropriate. There is no evidence of aphasia, agnosia, apraxia or anomia. Speech is clear with normal prosody and enunciation. Thought process is linear. Mood is normal and affect is normal.  Cranial nerves II - XII are as described above under HEENT exam. In addition: shoulder shrug is normal with equal shoulder height noted. Motor exam: Normal bulk, strength and tone is noted. There is no drift, tremor or rebound. Romberg is negative. Reflexes are 2+ throughout. Babinski: Toes are flexor bilaterally. Fine motor skills and coordination: intact with normal finger taps, normal hand movements, normal rapid alternating patting, normal foot taps and normal foot agility.  Cerebellar testing: No dysmetria or intention tremor on finger to nose testing. Heel to shin is unremarkable bilaterally. There is no  truncal or gait ataxia.  Sensory exam: intact to light touch, vibration, in the upper and lower extremities.  Gait, station and balance: She stands easily. No veering to one side is noted. No leaning to one side is noted. Posture is age-appropriate and stance is narrow based. Gait shows normal stride length and normal pace. No problems turning are noted.  Assessment and Plan:  In summary, DONNIKA KUCHER is a very pleasant 76 y.o.-year old female with an underlying medical history of CREST (some 15 or 20 years ago per pt, not followed by rheum), hyperlipidemia, reflux disease, osteopenia, and obesity, who presents for neurologic consultation after a recent hospitalization for work finding difficulties. She had a fall TIA workup. Test results were benign for the most part, LDL slightly above goal at 127, A1c in the normal range. Exam is nonfocal, she feels at baseline. She is advised to continue with her current management including baby aspirin and Crestor. We talked a little bit about stress management. She is advised to try to exercise on a regular basis and to hydrate with water adequately, to avoid overhydration with water as well as this can cause electrolyte disturbance. From my end of things she is doing well and she had appropriate workup which we reviewed today. I answered all her questions today and she can follow-up with primary care from now on.   Star Age, MD, PhD

## 2017-06-19 NOTE — Patient Instructions (Addendum)
Continue exercising regularly and take your medications as directed. As discussed, secondary prevention is key after a tia like event. This means: taking care of blood sugar values or diabetes management (A1c goal of less than 7.0), good blood pressure (hypertension) control and optimizing cholesterol management (with LDL goal of less than 70), exercising daily or regularly within your own mobility limitations of course, and overall cardiovascular risk factor reduction, which includes screening for and treatment of obstructive sleep apnea (OSA) and weight management.  Please do not overhydrate with water.   You had appropriate work up in the hospital, which thankfully was benign and very reassuring.   Your exam looks good, I am pleased to see.   I can see you in follow up as needed. Follow up

## 2017-07-14 ENCOUNTER — Encounter: Payer: Self-pay | Admitting: Family Medicine

## 2017-07-14 ENCOUNTER — Ambulatory Visit (INDEPENDENT_AMBULATORY_CARE_PROVIDER_SITE_OTHER): Payer: Medicare HMO | Admitting: Family Medicine

## 2017-07-14 VITALS — BP 148/80 | HR 76 | Ht 64.0 in | Wt 192.8 lb

## 2017-07-14 DIAGNOSIS — K219 Gastro-esophageal reflux disease without esophagitis: Secondary | ICD-10-CM

## 2017-07-14 DIAGNOSIS — G459 Transient cerebral ischemic attack, unspecified: Secondary | ICD-10-CM

## 2017-07-14 NOTE — Patient Instructions (Signed)
Double the dose of the Zantac and see if that controls your symptoms if not then switched to the Prilosec at 20 mg

## 2017-07-14 NOTE — Progress Notes (Signed)
   Subjective:    Patient ID: Cristina Sawyer, female    DOB: 15-Jun-1941, 76 y.o.   MRN: [id number]  HPI She is here for a recheck. She did have a previous history of TIA. She was recently seen by neurology. That note was reviewed. The workup for her TIA was entirely negative. She's had no symptoms since then. She has been on Crestor and having no aches or pains with that. She also has been using Zantac for control of her reflux symptoms that are apparently on intermittent in nature. She is getting minimal relief with Zantac.   Review of Systems     Objective:   Physical Exam Alert and in no distress otherwise not examined       Assessment & Plan:  Transient cerebral ischemia, unspecified type - Plan: Lipid panel  Gastroesophageal reflux disease without esophagitis Recommend she double the dose of the Zantac and if still having difficulty, then switch to Prilosec. She will keep me informed.

## 2017-07-15 LAB — LIPID PANEL
CHOLESTEROL: 170 mg/dL (ref <200)
HDL: 85 mg/dL (ref >50)
LDL CHOLESTEROL (CALC): 67 mg/dL
Non-HDL Cholesterol (Calc): 85 mg/dL (calc) (ref <130)
Total CHOL/HDL Ratio: 2 (calc) (ref <5.0)
Triglycerides: 97 mg/dL (ref <150)

## 2018-01-20 IMAGING — MR MR HEAD W/O CM
8 of 11 series · 39 of 48 positions shown · IV contrast (Yes)
Comparison: Head CT 05/02/2017

Brain MRI 05/19/2008

CLINICAL DATA: Thunderclap headache

EXAM:
MRI HEAD WITHOUT CONTRAST
TECHNIQUE: Multiplanar, multiecho pulse sequences of the brain and surrounding
structures were obtained without intravenous contrast.

[Series 3: DWI · axial · 3.0mm · 1.09mm/px · z∈[-29,+125]mm · 11 of 106 slices shown (1 of 4)]
[im 1/106]
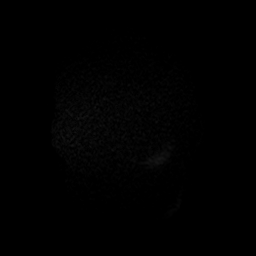
[im 11/106]
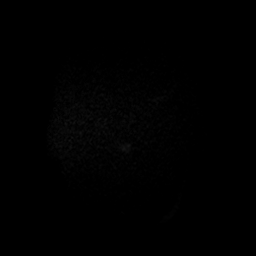
[im 22/106]
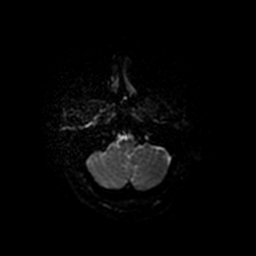
[im 32/106]
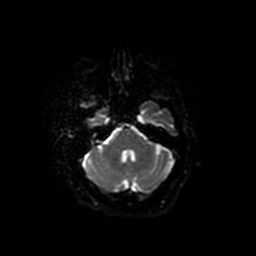
[im 43/106]
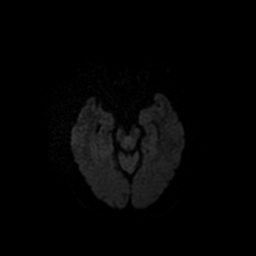
[im 53/106]
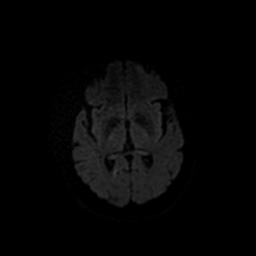
[im 64/106]
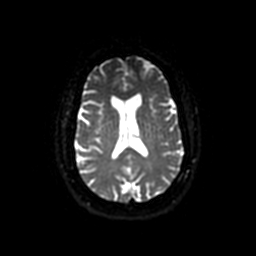
[im 74/106]
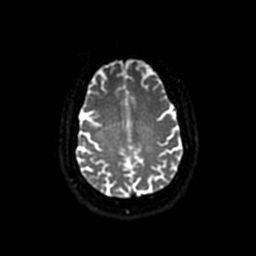
[im 85/106]
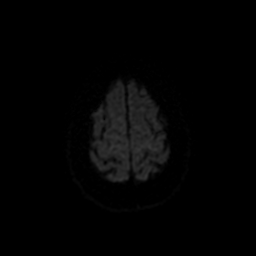
[im 95/106]
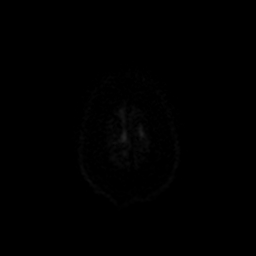
[im 106/106]
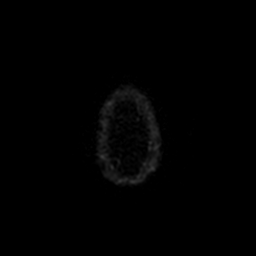

[Series 4: T1 · sagittal · 5.0mm · 0.47mm/px · 3 of 24 slices shown]
[im 1/24]
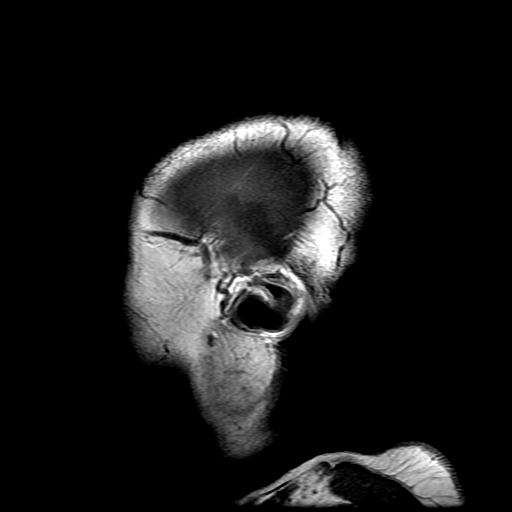
[im 12/24]
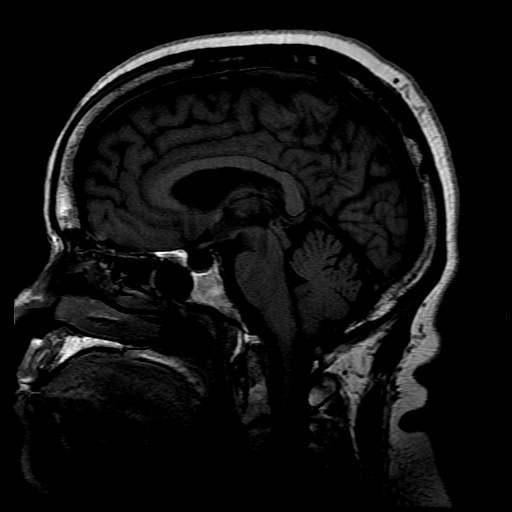
[im 24/24]
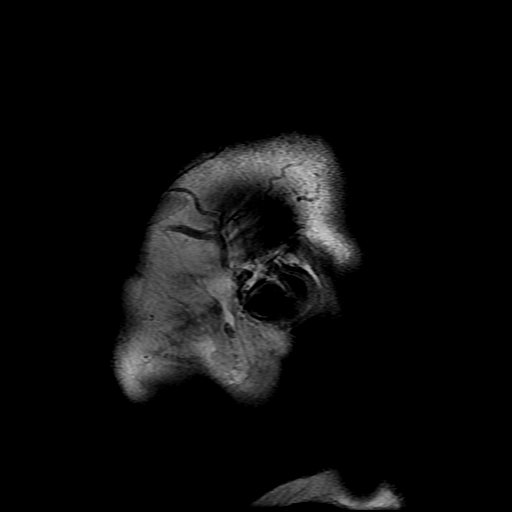

[Series 5: DWI · coronal · 5.0mm · 1.09mm/px · 7 of 70 slices shown (2 of 4)]
[im 1/70]
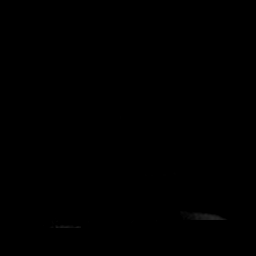
[im 12/70]
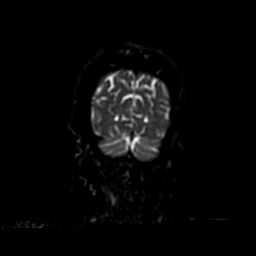
[im 24/70]
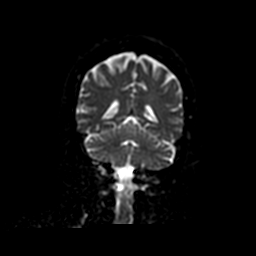
[im 35/70]
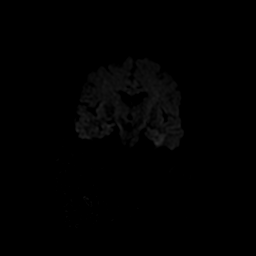
[im 47/70]
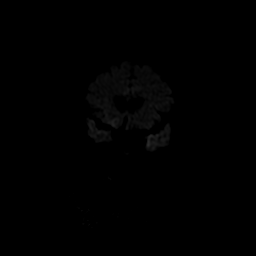
[im 58/70]
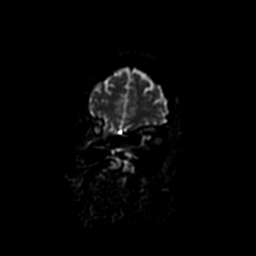
[im 70/70]
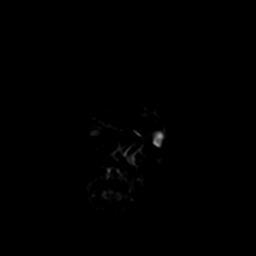

[Series 6: T2 · axial · 5.0mm · 0.43mm/px · z∈[-24,+129]mm · 2 of 23 slices shown (1 of 2)]
[im 1/23]
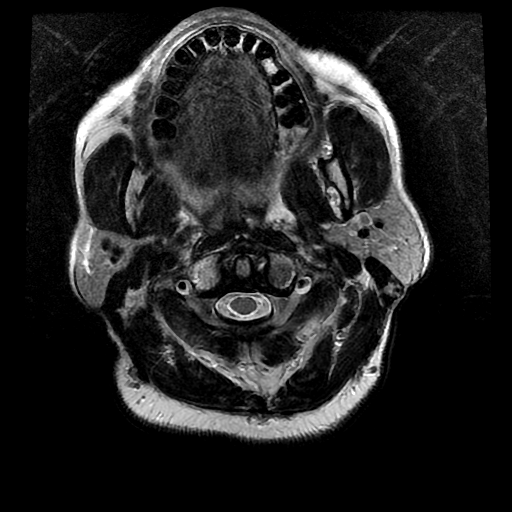
[im 23/23]
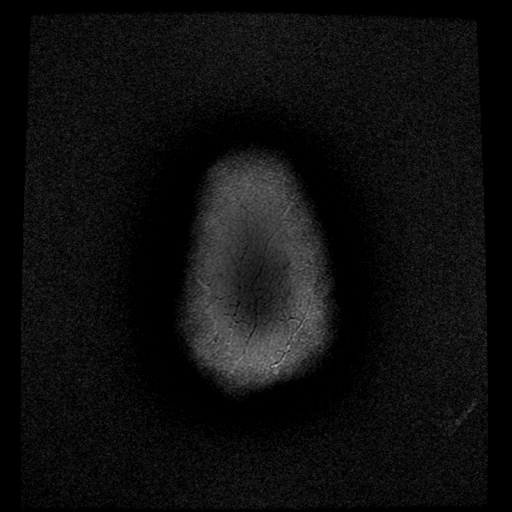

[Series 8: FLAIR · axial · 3.0mm · 0.43mm/px · z∈[-67,+82]mm · 3 of 26 slices shown]
[im 1/26]
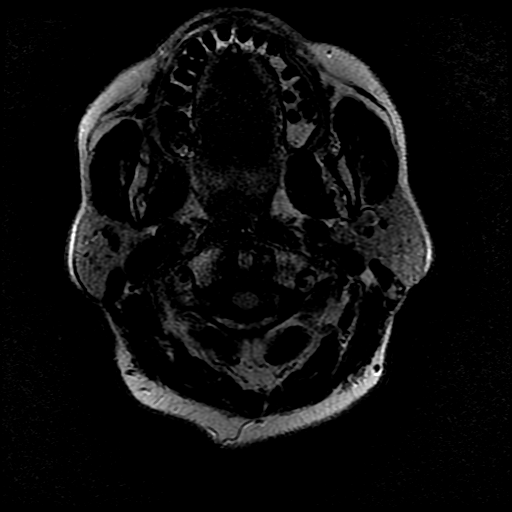
[im 13/26]
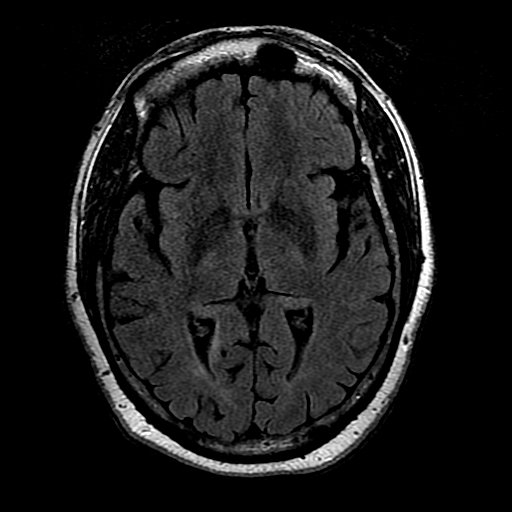
[im 26/26]
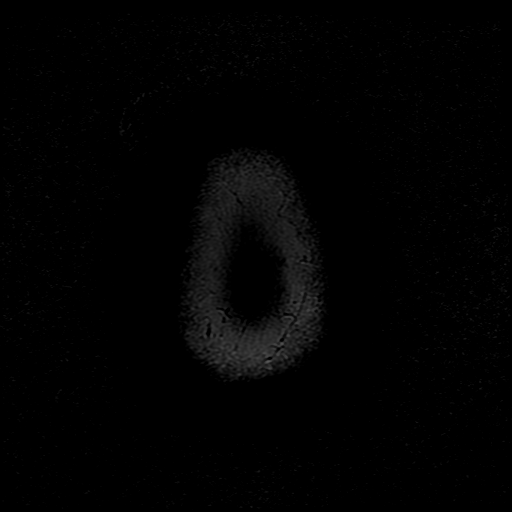

[Series 11: T2 · coronal · 5.0mm · 0.45mm/px · 3 of 26 slices shown (2 of 2)]
[im 1/26]
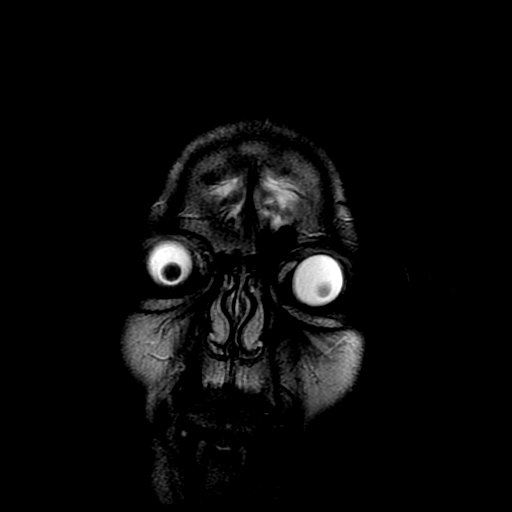
[im 13/26]
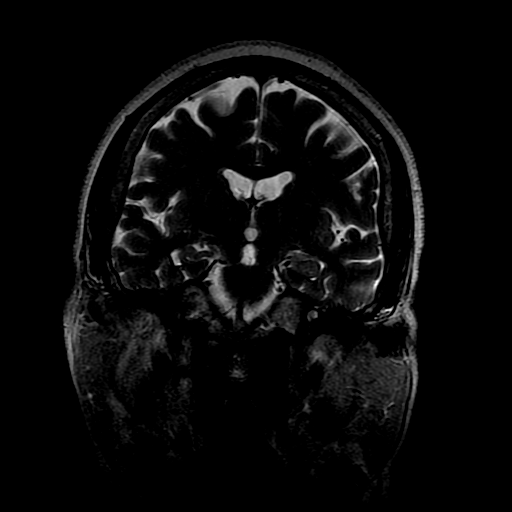
[im 26/26]
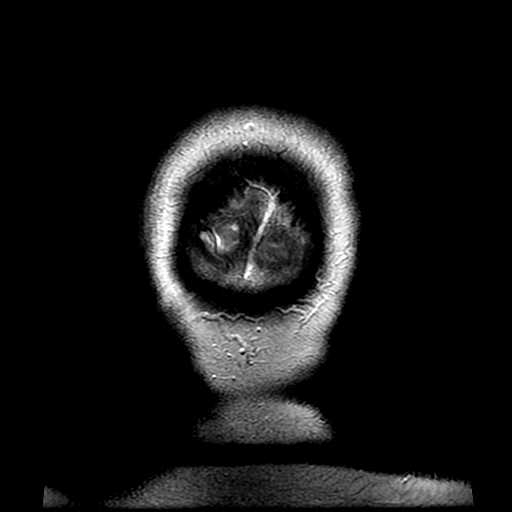

[Series 300: DWI · axial · 3.0mm · 1.09mm/px · z∈[-29,+125]mm · 6 of 53 slices shown (3 of 4)]
[im 1/53]
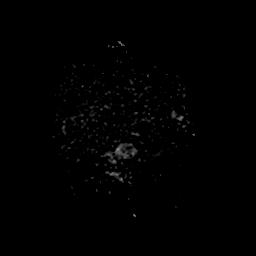
[im 11/53]
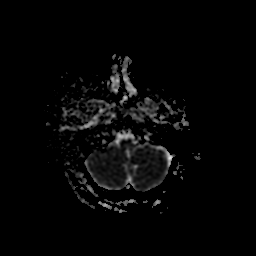
[im 21/53]
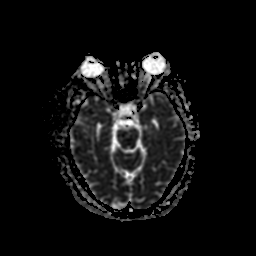
[im 32/53]
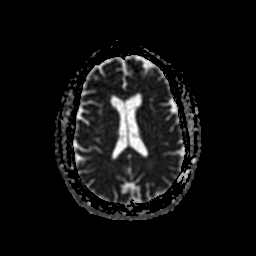
[im 42/53]
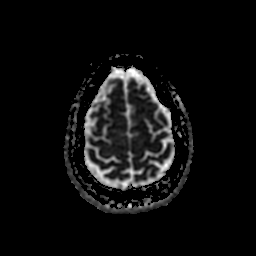
[im 53/53]
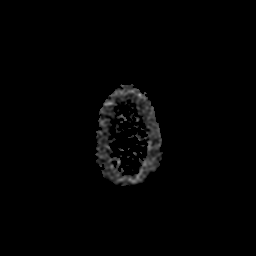

[Series 500: DWI · coronal · 5.0mm · 1.09mm/px · 4 of 35 slices shown (4 of 4)]
[im 1/35]
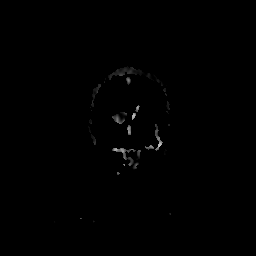
[im 12/35]
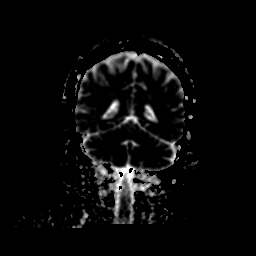
[im 23/35]
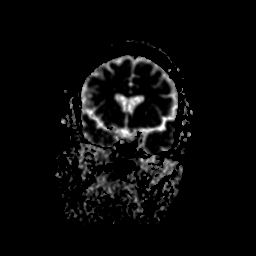
[im 35/35]
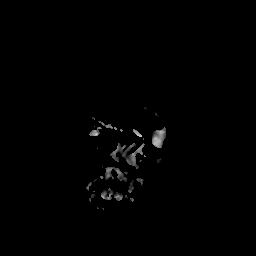

[39 of 48 positions shown; findings below may reference images not displayed]

FINDINGS: Brain: Partially empty sella. There is no focal diffusion
restriction to indicate acute infarct. The brain parenchymal signal
is normal and there is no mass lesion. No intraparenchymal hematoma
or chronic microhemorrhage. Brain volume is normal for age without
lobar predominant atrophy. The dura is normal and there is no
extra-axial collection.

Vascular: Major intracranial arterial and venous sinus flow voids
are preserved.

Skull and upper cervical spine: The visualized skull base,
calvarium, upper cervical spine and extracranial soft tissues are
normal.

Sinuses/Orbits: No fluid levels or advanced mucosal thickening. No
mastoid or middle ear effusion. Normal orbits.
IMPRESSION: Normal MRI of the brain for age.

## 2018-01-20 IMAGING — CT CT HEAD W/O CM
3 of 4 series · 15 of 47 positions shown, 18 images · non-contrast
Comparison: 05/18/2008

CLINICAL DATA: Alert and oriented.  Headache.

EXAM:
CT HEAD WITHOUT CONTRAST
TECHNIQUE: Contiguous axial images were obtained from the base of the skull
through the vertex without intravenous contrast.

[Series 2: head w/o · axial · non-contrast · 0.45mm/px · z∈[+1496,+1616]mm · 9 of 29 slices shown, 12 images]
[im 3/29  brain]
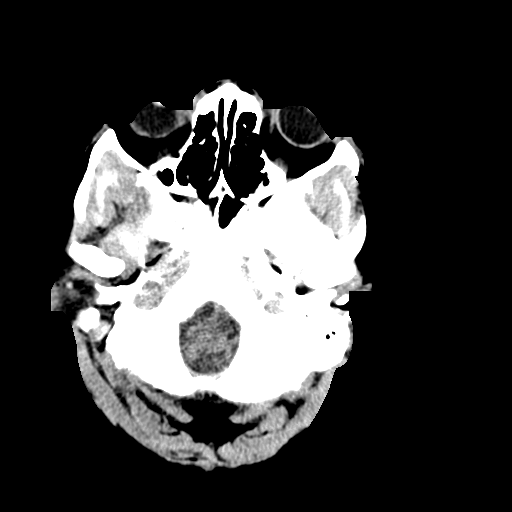
[im 3/29  bone]
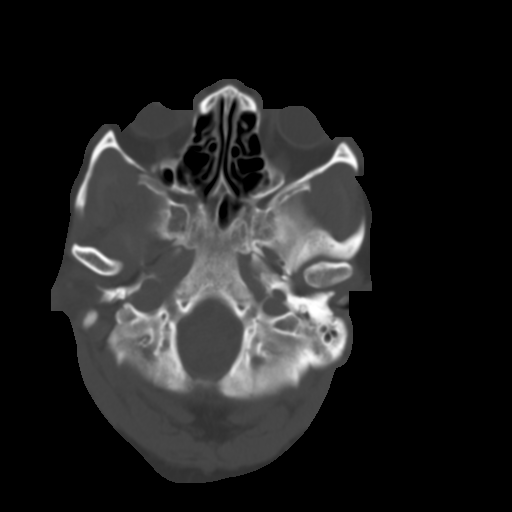
[im 7/29  brain]
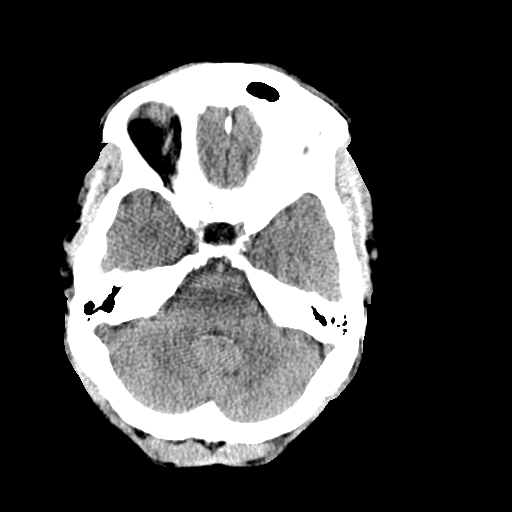
[im 9/29  brain]
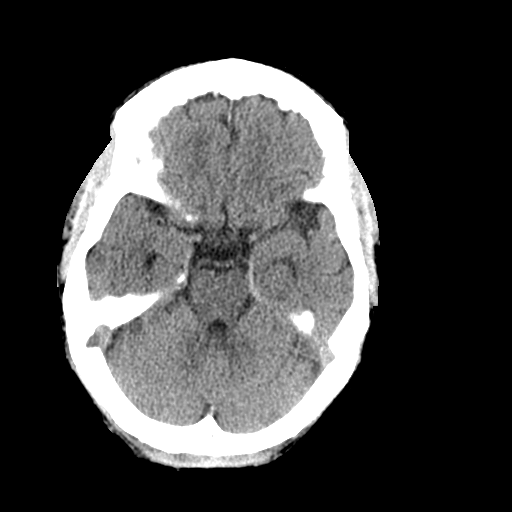
[im 13/29  brain]
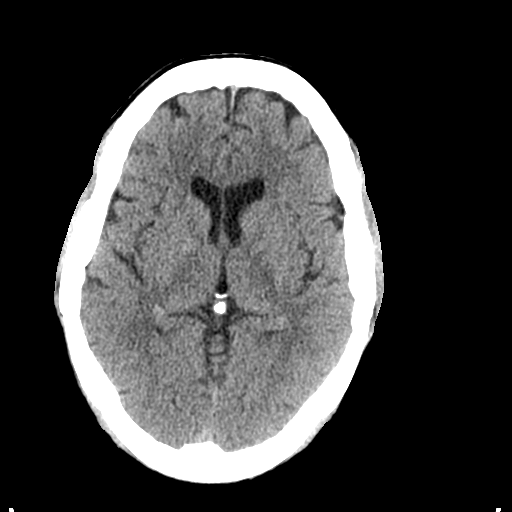
[im 15/29  brain]
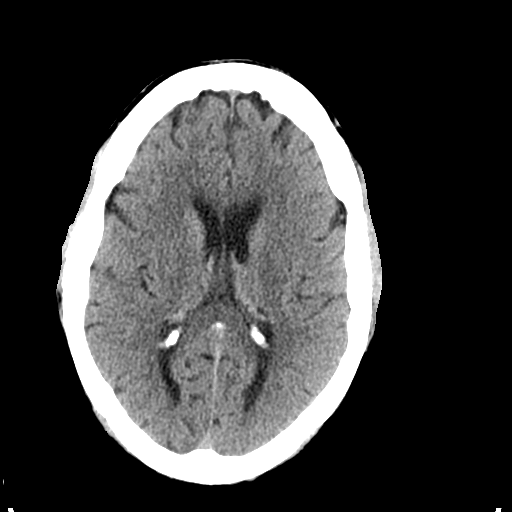
[im 15/29  bone]
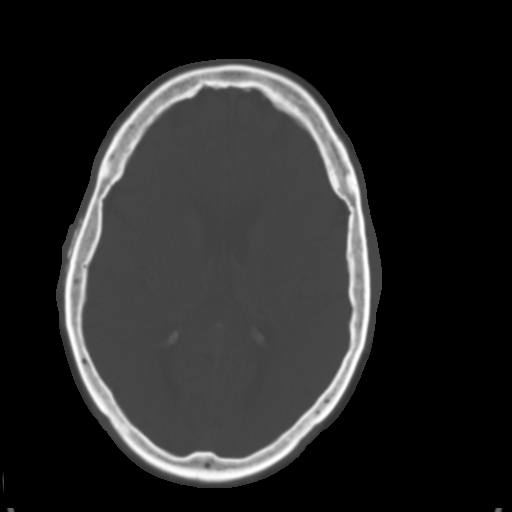
[im 17/29  brain]
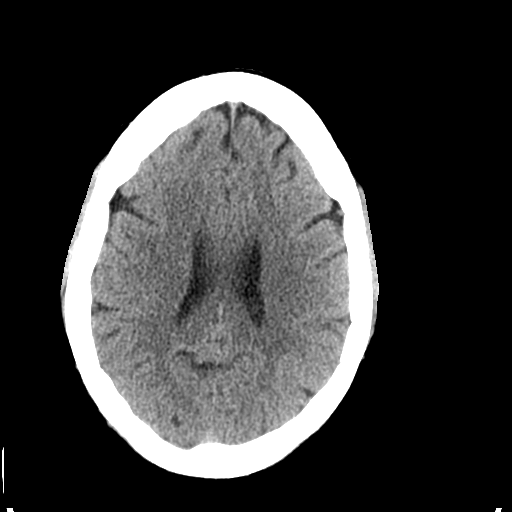
[im 21/29  brain]
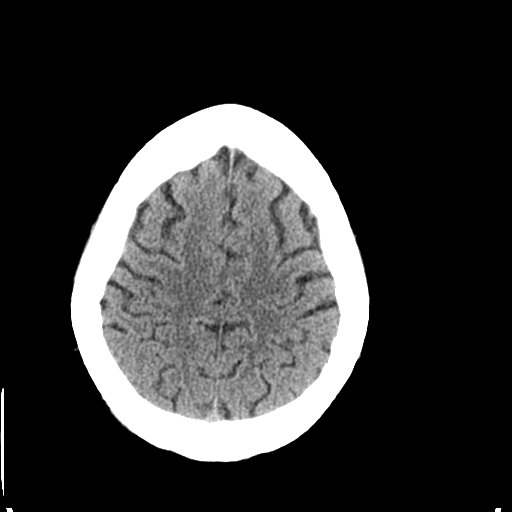
[im 23/29  brain]
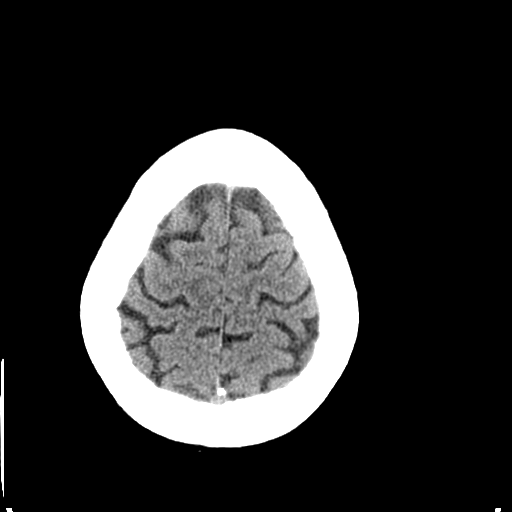
[im 27/29  brain]
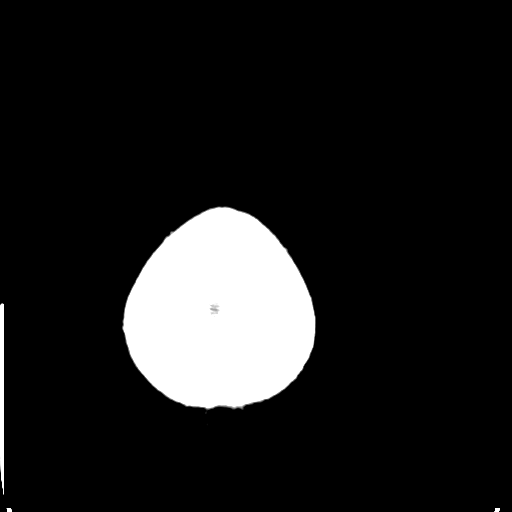
[im 27/29  bone]
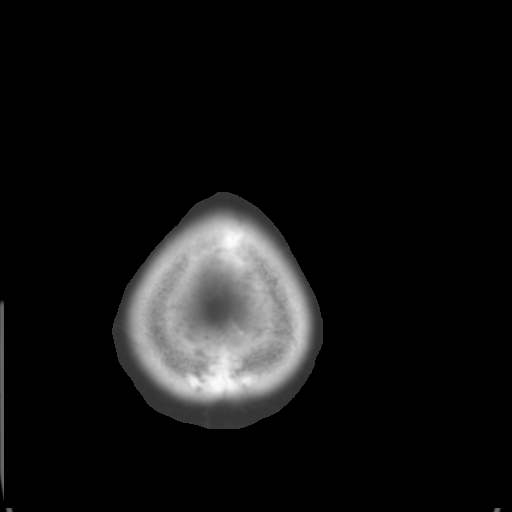

[Series 4: coronal · coronal · 0.34mm/px · 3 of 64 slices shown]
[im 22/64  brain]
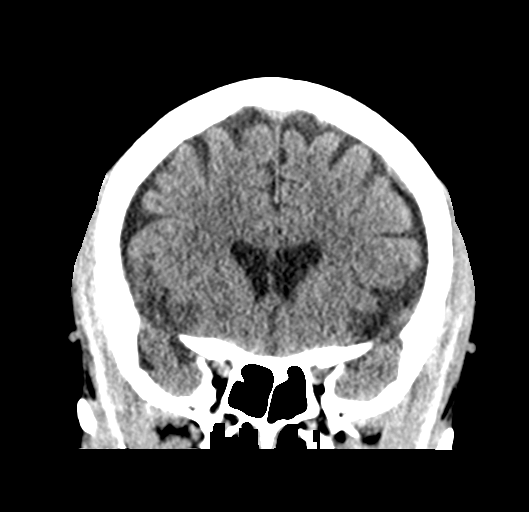
[im 29/64  brain]
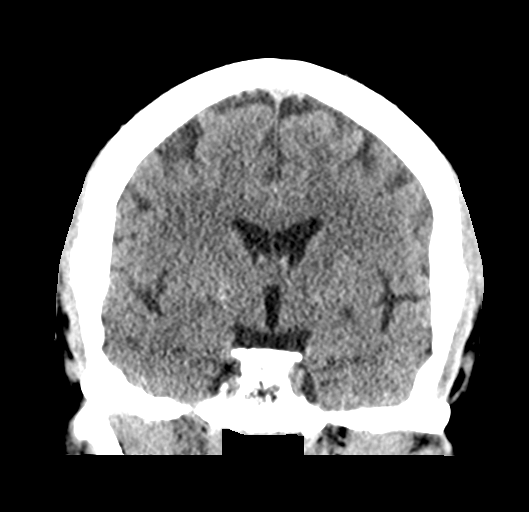
[im 36/64  brain]
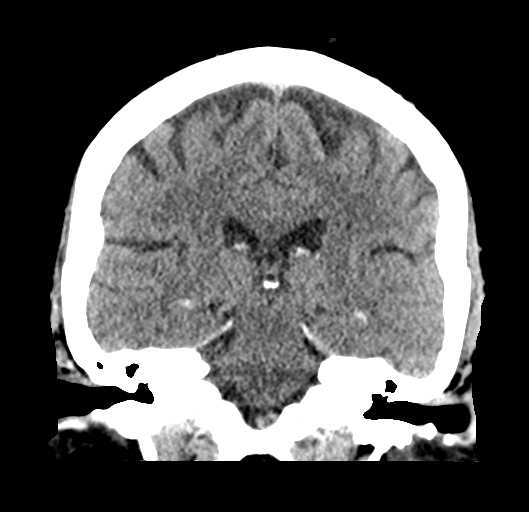

[Series 5: sagittal · sagittal · 0.33mm/px · 3 of 51 slices shown]
[im 17/51  brain]
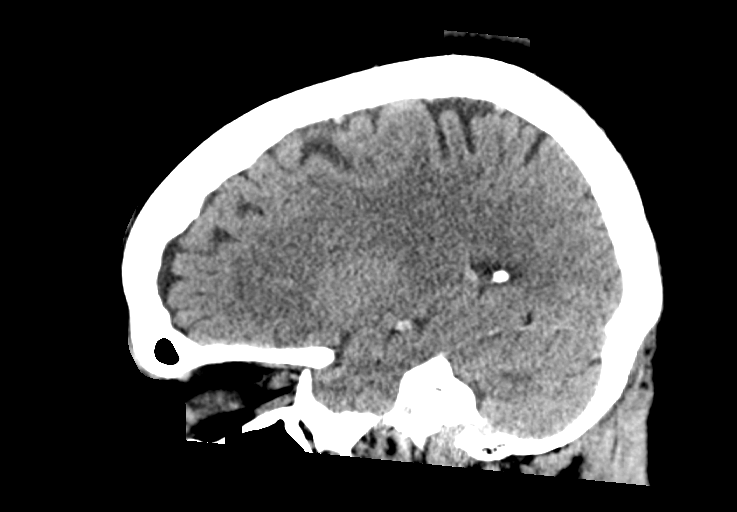
[im 26/51  brain]
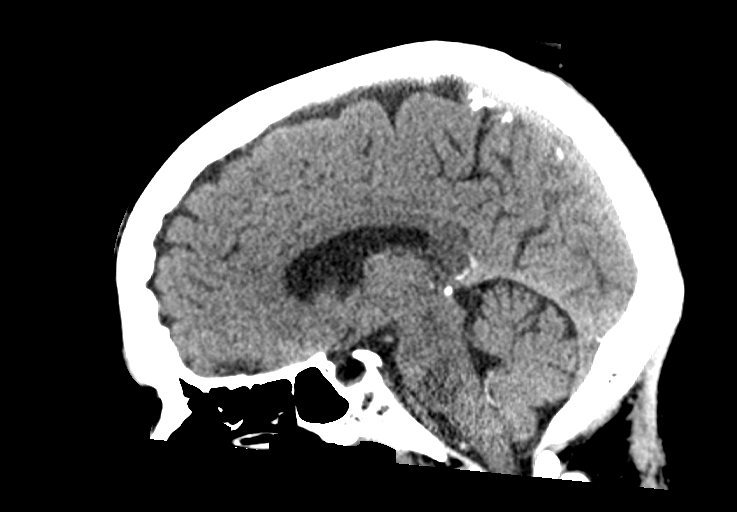
[im 34/51  brain]
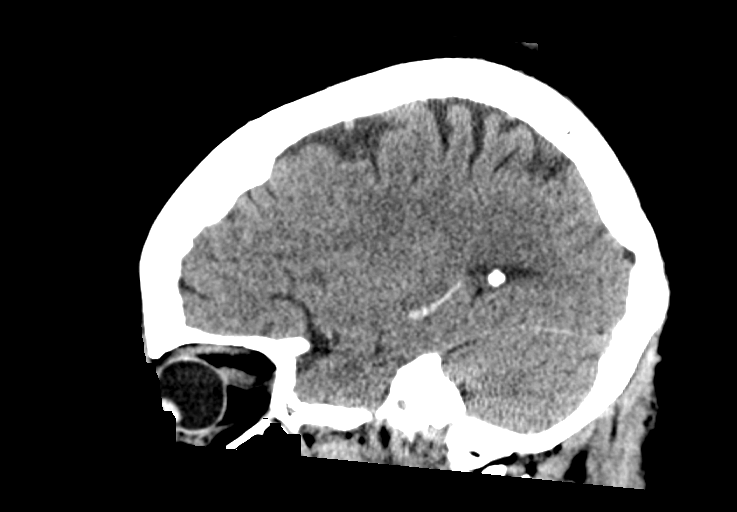

[15 of 47 positions shown; findings below may reference images not displayed]

FINDINGS: Brain: No evidence of acute infarction, hemorrhage, hydrocephalus,
extra-axial collection or mass lesion/mass effect.

Vascular: No hyperdense vessel or unexpected calcification.

Skull: No osseous abnormality.

Sinuses/Orbits: Visualized paranasal sinuses are clear. Visualized
mastoid sinuses are clear. Visualized orbits demonstrate no focal
abnormality.

Other: None
IMPRESSION: 1. No acute intracranial pathology.

## 2018-02-13 ENCOUNTER — Observation Stay (HOSPITAL_COMMUNITY)
Admission: EM | Admit: 2018-02-13 | Discharge: 2018-02-15 | Disposition: A | Discharge status: Home / Self Care | Payer: Medicare HMO | Attending: Internal Medicine | Admitting: Internal Medicine

## 2018-02-13 ENCOUNTER — Emergency Department (HOSPITAL_COMMUNITY): Payer: Medicare HMO

## 2018-02-13 ENCOUNTER — Encounter (HOSPITAL_COMMUNITY): Payer: Self-pay | Admitting: Emergency Medicine

## 2018-02-13 ENCOUNTER — Other Ambulatory Visit: Payer: Self-pay

## 2018-02-13 DIAGNOSIS — R0609 Other forms of dyspnea: Secondary | ICD-10-CM

## 2018-02-13 DIAGNOSIS — M341 CR(E)ST syndrome: Secondary | ICD-10-CM | POA: Diagnosis not present

## 2018-02-13 DIAGNOSIS — Z885 Allergy status to narcotic agent status: Secondary | ICD-10-CM | POA: Diagnosis not present

## 2018-02-13 DIAGNOSIS — Z7982 Long term (current) use of aspirin: Secondary | ICD-10-CM | POA: Diagnosis not present

## 2018-02-13 DIAGNOSIS — I129 Hypertensive chronic kidney disease with stage 1 through stage 4 chronic kidney disease, or unspecified chronic kidney disease: Secondary | ICD-10-CM | POA: Diagnosis not present

## 2018-02-13 DIAGNOSIS — N183 Chronic kidney disease, stage 3 unspecified: Secondary | ICD-10-CM

## 2018-02-13 DIAGNOSIS — Z8249 Family history of ischemic heart disease and other diseases of the circulatory system: Secondary | ICD-10-CM | POA: Diagnosis not present

## 2018-02-13 DIAGNOSIS — Z8673 Personal history of transient ischemic attack (TIA), and cerebral infarction without residual deficits: Secondary | ICD-10-CM | POA: Diagnosis not present

## 2018-02-13 DIAGNOSIS — E559 Vitamin D deficiency, unspecified: Secondary | ICD-10-CM | POA: Insufficient documentation

## 2018-02-13 DIAGNOSIS — E785 Hyperlipidemia, unspecified: Secondary | ICD-10-CM | POA: Insufficient documentation

## 2018-02-13 DIAGNOSIS — R0789 Other chest pain: Secondary | ICD-10-CM | POA: Diagnosis not present

## 2018-02-13 DIAGNOSIS — R079 Chest pain, unspecified: Secondary | ICD-10-CM | POA: Diagnosis not present

## 2018-02-13 DIAGNOSIS — Z66 Do not resuscitate: Secondary | ICD-10-CM | POA: Diagnosis not present

## 2018-02-13 DIAGNOSIS — Z800 Family history of malignant neoplasm of digestive organs: Secondary | ICD-10-CM | POA: Insufficient documentation

## 2018-02-13 DIAGNOSIS — R0600 Dyspnea, unspecified: Secondary | ICD-10-CM

## 2018-02-13 DIAGNOSIS — Z79899 Other long term (current) drug therapy: Secondary | ICD-10-CM | POA: Diagnosis not present

## 2018-02-13 DIAGNOSIS — K219 Gastro-esophageal reflux disease without esophagitis: Secondary | ICD-10-CM | POA: Insufficient documentation

## 2018-02-13 DIAGNOSIS — I10 Essential (primary) hypertension: Secondary | ICD-10-CM | POA: Diagnosis present

## 2018-02-13 HISTORY — DX: Essential (primary) hypertension: I10

## 2018-02-13 LAB — BASIC METABOLIC PANEL
Anion gap: 12 (ref 5–15)
BUN: 12 mg/dL (ref 6–20)
CHLORIDE: 105 mmol/L (ref 101–111)
CO2: 25 mmol/L (ref 22–32)
CREATININE: 1.12 mg/dL — AB (ref 0.44–1.00)
Calcium: 9.9 mg/dL (ref 8.9–10.3)
GFR calc non Af Amer: 46 mL/min — ABNORMAL LOW (ref >60)
GFR, EST AFRICAN AMERICAN: 53 mL/min — AB (ref >60)
Glucose, Bld: 135 mg/dL — ABNORMAL HIGH (ref 65–99)
Potassium: 3.7 mmol/L (ref 3.5–5.1)
Sodium: 142 mmol/L (ref 135–145)

## 2018-02-13 LAB — D-DIMER, QUANTITATIVE (NOT AT ARMC): D DIMER QUANT: 0.29 ug{FEU}/mL (ref 0.00–0.50)

## 2018-02-13 LAB — I-STAT TROPONIN, ED
Troponin i, poc: 0 ng/mL (ref 0.00–0.08)
Troponin i, poc: 0.01 ng/mL (ref 0.00–0.08)

## 2018-02-13 LAB — CBC
HEMATOCRIT: 45.6 % (ref 36.0–46.0)
HEMOGLOBIN: 14.9 g/dL (ref 12.0–15.0)
MCH: 27.8 pg (ref 26.0–34.0)
MCHC: 32.7 g/dL (ref 30.0–36.0)
MCV: 85.1 fL (ref 78.0–100.0)
Platelets: 246 10*3/uL (ref 150–400)
RBC: 5.36 MIL/uL — AB (ref 3.87–5.11)
RDW: 14 % (ref 11.5–15.5)
WBC: 10 10*3/uL (ref 4.0–10.5)

## 2018-02-13 LAB — BRAIN NATRIURETIC PEPTIDE: B NATRIURETIC PEPTIDE 5: 36.3 pg/mL (ref 0.0–100.0)

## 2018-02-13 MED ORDER — ASPIRIN 81 MG PO CHEW
324.0000 mg | CHEWABLE_TABLET | Freq: Once | ORAL | Status: AC
  Administered 2018-02-13: 324 mg via ORAL
  Filled 2018-02-13: qty 4

## 2018-02-13 NOTE — ED Provider Notes (Signed)
[facility] EMERGENCY DEPARTMENT Provider Note   CSN: [id number] Arrival date & time: 02/13/18  1308     History   Chief Complaint Chief Complaint  Patient presents with  . Chest Pain    HPI Cristina Sawyer is a 77 y.o. female.  77yo F w/ PMH including HLD, GERD who p/w palpitations, chest pain, and SOB. She has had 1 week of heart palpitations that initially improved for a day but then returned. Last night she had significant chest tightness. She took gas-ex and gingerale which did not help. Pain is intermittent, worse laying down vs standing up. Pain is sometimes pleuritic, sometimes associated with nausea. Last night pain was radiating into jaws and down right arm. Currently pain is mild, she still feels heart fluttering sensation. Yesterday she noticed some shortness of breath that is worse with ambulation. No fevers, V/D, cough/cold symptoms, recent travel, leg swelling/pain, estrogen use, h/o clots, or h/o cancer.  Family hx notable for 2 sisters with heart disease. No h/o smoking or drug use.  The history is provided by the patient.  Chest Pain      Past Medical History:  Diagnosis Date  . Arthritis   . Cataract   . CREST syndrome ([place])    pt denies (saw rheum)  . Diverticulosis   . Dyslipidemia   . GERD (gastroesophageal reflux disease)   . Osteoporosis    OSTEOPENIA  . Personal history of colonic adenoma 08/09/2008  . Vitamin D deficiency     Patient Active Problem List   Diagnosis Date Noted  . Essential hypertension 05/12/2017  . Transient cerebral ischemia 05/12/2017  . Aphasia 05/02/2017  . Headache 05/02/2017  . Allergic rhinitis due to pollen 12/29/2014  . GERD (gastroesophageal reflux disease) 01/17/2014  . Personal history of colonic adenoma 08/09/2008    Past Surgical History:  Procedure Laterality Date  . ABDOMINAL HYSTERECTOMY  1975  . APPENDECTOMY  1975  . COLONOSCOPY    . thyroid nodule  2000     OB History    None      Home Medications    Prior to Admission medications   Medication Sig Start Date End Date Taking? Authorizing Provider  amLODipine (NORVASC) 5 MG tablet Take 1 tablet (5 mg total) by mouth daily. 05/12/17  Yes Denita Lung, MD  aspirin EC 81 MG tablet Take 81 mg by mouth daily.   Yes [provider]  cetirizine (ZYRTEC) 10 MG tablet Take 10 mg by mouth daily as needed for allergies.    Yes [provider]  ranitidine (ZANTAC) 75 MG tablet Take 75 mg by mouth 2 (two) times daily as needed for heartburn.    Yes [provider]  rosuvastatin (CRESTOR) 5 MG tablet Take 1 tablet (5 mg total) by mouth daily. 05/12/17  Yes Denita Lung, MD  omeprazole (PRILOSEC OTC) 20 MG tablet Take 20 mg by mouth daily.    12/11/11  [provider]    Family History Family History  Problem Relation Age of Onset  . Colon cancer Neg Hx     Social History Social History   Tobacco Use  . Smoking status: Never Smoker  . Smokeless tobacco: Never Used  Substance Use Topics  . Alcohol use: No  . Drug use: No     Allergies   Codeine and Ciprofloxacin hcl   Review of Systems Review of Systems  Cardiovascular: Positive for chest pain.   All other systems reviewed and  are negative except that which was mentioned in HPI   Physical Exam Updated Vital Signs BP 140/62   Pulse 67   Temp 98.2 F (36.8 C) (Oral)   Resp 13   SpO2 98%   Physical Exam  Constitutional: She is oriented to person, place, and time. She appears well-developed and well-nourished. No distress.  HENT:  Head: Normocephalic and atraumatic.  Moist mucous membranes  Eyes: Conjunctivae are normal.  Neck: Neck supple.  Cardiovascular: Normal rate, regular rhythm and normal heart sounds.  No murmur heard. Pulmonary/Chest: Effort normal and breath sounds normal.  Abdominal: Soft. Bowel sounds are normal. She exhibits no distension. There is no tenderness.  Musculoskeletal: She  exhibits no edema.  Neurological: She is alert and oriented to person, place, and time.  Fluent speech  Skin: Skin is warm and dry.  Psychiatric: She has a normal mood and affect. Judgment normal.  Nursing note and vitals reviewed.    ED Treatments / Results  Labs (all labs ordered are listed, but only abnormal results are displayed) Labs Reviewed  BASIC METABOLIC PANEL - Abnormal; Notable for the following components:      Result Value   Glucose, Bld 135 (*)    Creatinine, Ser 1.12 (*)    GFR calc non Af Amer 46 (*)    GFR calc Af Amer 53 (*)    All other components within normal limits  CBC - Abnormal; Notable for the following components:   RBC 5.36 (*)    All other components within normal limits  BRAIN NATRIURETIC PEPTIDE  D-DIMER, QUANTITATIVE (NOT AT [facility])  I-STAT TROPONIN, ED  I-STAT TROPONIN, ED    EKG EKG Interpretation  Date/Time:  Friday Feb 13 2018 13:22:41 EDT Ventricular Rate:  96 PR Interval:  168 QRS Duration: 78 QT Interval:  360 QTC Calculation: 454 R Axis:   73 Text Interpretation:  Sinus rhythm with Premature atrial complexes Right atrial enlargement Nonspecific T wave abnormality Abnormal ECG PACs new from previous Confirmed by Theotis Burrow [phone]) on 02/13/2018 8:50:05 PM   Radiology Dg Chest 2 View  Result Date: 02/13/2018 CLINICAL DATA:  Chest pain, heart palpitations and shortness of breath. EXAM: CHEST - 2 VIEW COMPARISON:  01/18/2015 FINDINGS: The heart size and mediastinal contours are within normal limits. Both lungs are clear. The visualized skeletal structures are unremarkable. IMPRESSION: Negative exam. Electronically Signed   By: Lorriane Shire M.D.   On: 02/13/2018 13:43    Procedures Procedures (including critical care time)  Medications Ordered in ED Medications  aspirin chewable tablet 324 mg (324 mg Oral Given 02/13/18 2124)     Initial Impression / Assessment and Plan / ED Course  I have reviewed the triage vital signs  and the nursing notes.  Pertinent labs & imaging results that were available during my care of the patient were reviewed by me and considered in my medical decision making (see chart for details).    Pt well-appearing on exam with reassuring vital signs.  No acute ischemic changes on EKG.  Her lab work shows negative serial troponins, normal d-dimer, normal BNP.  I am concerned about her development of exertional shortness of breath today as well as the nature of her chest pain, which has continued throughout today.  Given her family history and personal risk factors, her HEART score is 4. Recommended admission for chest pain rule out as she has never had stress testing.  Discussed admission with [facility] hospitalist and patient admitted for further care.  Final Clinical Impressions(s) / ED Diagnoses   Final diagnoses:  Chest pain, unspecified type  Exertional dyspnea    ED Discharge Orders    None       Little, Wenda Overland, MD 02/14/18 0009

## 2018-02-13 NOTE — ED Provider Notes (Signed)
Patient placed in Quick Look pathway, seen and evaluated   Chief Complaint: Palpitations, shortness of breath, chest pain  HPI:   Patient presents with 1 week history of intermittent palpitations.  Yesterday evening she developed recurrence of palpitations with associated substernal chest pain and right upper extremity pain.  Denies fevers, chills, cough.  Endorses nausea but no abdominal pain.  Denies lower extremity weakness.  No recent travel or surgeries.  States she has experienced palpitations like this in the past but not to this extent.  Drinks 1 cup of coffee daily and has been drinking ginger ale for "gas ", denies recreational drug use or excessive alcohol intake.  She is a non-smoker.  ROS: Positive for palpitations, chest pain, shortness of breath, negative for fevers, chills, cough  Physical Exam:   Gen: No distress  Neuro: Awake and Alert  Skin: Warm    Focused Exam: Heart rate regular, regular rhythm.  No murmurs rubs or gallops noted.  Scattered crackles on auscultation of the lungs.  Equal rise and fall of chest, mildly tachypneic but speaking in full sentences without difficulty.  No lower extremity edema.  2+ radial and DP/PT pulses bilaterally.   Initiation of care has begun. The patient has been counseled on the process, plan, and necessity for staying for the completion/evaluation, and the remainder of the medical screening examination    Debroah Baller 02/13/18 [place], Julie, MD 02/13/18 1455

## 2018-02-13 NOTE — ED Triage Notes (Addendum)
Patient complains of chest palpitations and shortness of breath that started approximately 1 week ago and has gotten progressively worse. Pain and shortness of breath are worse when laying down. Speaking in complete sentences. Patient states she normally takes aspirin daily but has not today.

## 2018-02-13 NOTE — ED Notes (Signed)
ED Provider at bedside. 

## 2018-02-14 ENCOUNTER — Other Ambulatory Visit: Payer: Self-pay

## 2018-02-14 ENCOUNTER — Encounter (HOSPITAL_COMMUNITY): Payer: Self-pay

## 2018-02-14 DIAGNOSIS — N183 Chronic kidney disease, stage 3 unspecified: Secondary | ICD-10-CM

## 2018-02-14 DIAGNOSIS — R079 Chest pain, unspecified: Secondary | ICD-10-CM | POA: Diagnosis not present

## 2018-02-14 DIAGNOSIS — R0609 Other forms of dyspnea: Secondary | ICD-10-CM | POA: Diagnosis not present

## 2018-02-14 DIAGNOSIS — R0600 Dyspnea, unspecified: Secondary | ICD-10-CM

## 2018-02-14 DIAGNOSIS — I10 Essential (primary) hypertension: Secondary | ICD-10-CM | POA: Diagnosis not present

## 2018-02-14 LAB — CBC
HEMATOCRIT: 41.6 % (ref 36.0–46.0)
HEMOGLOBIN: 13.6 g/dL (ref 12.0–15.0)
MCH: 27.5 pg (ref 26.0–34.0)
MCHC: 32.7 g/dL (ref 30.0–36.0)
MCV: 84 fL (ref 78.0–100.0)
Platelets: 226 10*3/uL (ref 150–400)
RBC: 4.95 MIL/uL (ref 3.87–5.11)
RDW: 13.8 % (ref 11.5–15.5)
WBC: 10.7 10*3/uL — ABNORMAL HIGH (ref 4.0–10.5)

## 2018-02-14 LAB — TSH: TSH: 2.982 u[IU]/mL (ref 0.350–4.500)

## 2018-02-14 LAB — TROPONIN I: Troponin I: <0.03 ng/mL (ref <0.03)

## 2018-02-14 LAB — CREATININE, SERUM
Creatinine, Ser: 0.92 mg/dL (ref 0.44–1.00)
GFR calc Af Amer: >60 mL/min (ref >60)
GFR calc non Af Amer: 59 mL/min — ABNORMAL LOW (ref >60)

## 2018-02-14 LAB — HEMOGLOBIN A1C
Hgb A1c MFr Bld: 5.7 % — ABNORMAL HIGH (ref 4.8–5.6)
Mean Plasma Glucose: 116.89 mg/dL

## 2018-02-14 LAB — MRSA PCR SCREENING: MRSA by PCR: NEGATIVE

## 2018-02-14 MED ORDER — DEXTROSE-NACL 5-0.45 % IV SOLN
INTRAVENOUS | Status: DC
  Administered 2018-02-14 (×2): via INTRAVENOUS

## 2018-02-14 MED ORDER — AMLODIPINE BESYLATE 5 MG PO TABS
5.0000 mg | ORAL_TABLET | Freq: Every day | ORAL | Status: DC
  Administered 2018-02-14 – 2018-02-15 (×2): 5 mg via ORAL
  Filled 2018-02-14 (×2): qty 1

## 2018-02-14 MED ORDER — ROSUVASTATIN CALCIUM 10 MG PO TABS
5.0000 mg | ORAL_TABLET | Freq: Every day | ORAL | Status: DC
  Administered 2018-02-14 – 2018-02-15 (×2): 5 mg via ORAL
  Filled 2018-02-14 (×2): qty 1

## 2018-02-14 MED ORDER — ACETAMINOPHEN 325 MG PO TABS
650.0000 mg | ORAL_TABLET | ORAL | Status: DC | PRN
  Administered 2018-02-14: 650 mg via ORAL
  Filled 2018-02-14: qty 2

## 2018-02-14 MED ORDER — FAMOTIDINE 20 MG PO TABS
10.0000 mg | ORAL_TABLET | Freq: Two times a day (BID) | ORAL | Status: DC
  Administered 2018-02-14 – 2018-02-15 (×3): 10 mg via ORAL
  Filled 2018-02-14 (×3): qty 1

## 2018-02-14 MED ORDER — ONDANSETRON HCL 4 MG/2ML IJ SOLN
4.0000 mg | Freq: Four times a day (QID) | INTRAMUSCULAR | Status: DC | PRN
Start: 2018-02-14 — End: 2018-02-15

## 2018-02-14 MED ORDER — HEPARIN SODIUM (PORCINE) 5000 UNIT/ML IJ SOLN
5000.0000 [IU] | Freq: Three times a day (TID) | INTRAMUSCULAR | Status: DC
  Administered 2018-02-14 – 2018-02-15 (×4): 5000 [IU] via SUBCUTANEOUS
  Filled 2018-02-14 (×4): qty 1

## 2018-02-14 MED ORDER — GI COCKTAIL ~~LOC~~: 30.0000 mL | Freq: Two times a day (BID) | ORAL | Status: DC | PRN

## 2018-02-14 NOTE — H&P (Signed)
History and Physical    [name]:[id number] DOB: [birth year] DOA: 02/13/2018  PCP: Denita Lung, MD  Patient coming from: home   Chief Complaint: chest pain  HPI: Cristina Sawyer is a 77 y.o. female with medical history significant for htn and ckd3 who presents with approximately one week of symptoms. First time this has happened. Denies history heart disease but does endorse strong family history (2 sisters with CAD). Says has been experiencing most days chest pressure and palpitations. Sometimes associated w/ exertion but other times not and many times present while resting. Yesterday sharper substernal pain and radiation to right arm and neck, this while in bed. Symptoms not currently present. No cough or sob. No fevers, no vomiting or diarrhea. Does have history gerd, thought yesterday her symptoms might be gerd. Denies hx venous thrombus or recent immobility or leg swelling.   ED Course: aspirin, labs, ekg, x-ray  Review of Systems: As per HPI otherwise 10 point review of systems negative.    Past Medical History:  Diagnosis Date  . Arthritis   . Cataract   . CREST syndrome ([place])    pt denies (saw rheum)  . Diverticulosis   . Dyslipidemia   . GERD (gastroesophageal reflux disease)   . Osteoporosis    OSTEOPENIA  . Personal history of colonic adenoma 08/09/2008  . Vitamin D deficiency     Past Surgical History:  Procedure Laterality Date  . ABDOMINAL HYSTERECTOMY  1975  . APPENDECTOMY  1975  . COLONOSCOPY    . thyroid nodule  2000     reports that she has never smoked. She has never used smokeless tobacco. She reports that she does not drink alcohol or use drugs.  Allergies  Allergen Reactions  . Codeine     REACTION: itch/nausea  . Ciprofloxacin Hcl Rash    Family History  Problem Relation Age of Onset  . Colon cancer Neg Hx     Prior to Admission medications   Medication Sig Start Date End Date Taking? Authorizing Provider  amLODipine  (NORVASC) 5 MG tablet Take 1 tablet (5 mg total) by mouth daily. 05/12/17  Yes Denita Lung, MD  aspirin EC 81 MG tablet Take 81 mg by mouth daily.   Yes [provider]  cetirizine (ZYRTEC) 10 MG tablet Take 10 mg by mouth daily as needed for allergies.    Yes [provider]  ranitidine (ZANTAC) 75 MG tablet Take 75 mg by mouth 2 (two) times daily as needed for heartburn.    Yes [provider]  rosuvastatin (CRESTOR) 5 MG tablet Take 1 tablet (5 mg total) by mouth daily. 05/12/17  Yes Denita Lung, MD  omeprazole (PRILOSEC OTC) 20 MG tablet Take 20 mg by mouth daily.    12/11/11  [provider]    Physical Exam: Vitals:   02/13/18 2127 02/13/18 2128 02/13/18 2130 02/13/18 2345  BP: (!) 154/54  (!) 148/65 140/62  Pulse: 75 64 69 67  Resp: 19 18 (!) 27 13  Temp:      TempSrc:      SpO2: 99% 99% 98% 98%    Constitutional: No acute distress Head: Atraumatic Eyes: Conjunctiva clear ENM: Moist mucous membranes. Normal dentition.  Neck: Supple Respiratory: Clear to auscultation bilaterally, no wheezing/rales/rhonchi. Normal respiratory effort. No accessory muscle use. . Cardiovascular: Regular rate and rhythm. No murmurs/rubs/gallops. Abdomen: Non-tender, non-distended. No masses. No rebound or guarding. Positive bowel sounds. Musculoskeletal: No joint deformity upper and  lower extremities. Normal ROM, no contractures. Normal muscle tone.  Skin: No rashes, lesions, or ulcers.  Extremities: No peripheral edema. Palpable peripheral pulses. Neurologic: Alert, moving all 4 extremities. Psychiatric: Normal insight and judgement.   Labs on Admission: I have personally reviewed following labs and imaging studies  CBC: Recent Labs  Lab 02/13/18 1328  WBC 10.0  HGB 14.9  HCT 45.6  MCV 85.1  PLT 951   Basic Metabolic Panel: Recent Labs  Lab 02/13/18 1328  NA 142  K 3.7  CL 105  CO2 25  GLUCOSE 135*  BUN 12  CREATININE 1.12*  CALCIUM  9.9   GFR: CrCl cannot be calculated (Unknown ideal weight.). Liver Function Tests: No results for input(s): AST, ALT, ALKPHOS, BILITOT, PROT, ALBUMIN in the last 168 hours. No results for input(s): LIPASE, AMYLASE in the last 168 hours. No results for input(s): AMMONIA in the last 168 hours. Coagulation Profile: No results for input(s): INR, PROTIME in the last 168 hours. Cardiac Enzymes: No results for input(s): CKTOTAL, CKMB, CKMBINDEX, TROPONINI in the last 168 hours. BNP (last 3 results) No results for input(s): PROBNP in the last 8760 hours. HbA1C: No results for input(s): HGBA1C in the last 72 hours. CBG: No results for input(s): GLUCAP in the last 168 hours. Lipid Profile: No results for input(s): CHOL, HDL, LDLCALC, TRIG, CHOLHDL, LDLDIRECT in the last 72 hours. Thyroid Function Tests: No results for input(s): TSH, T4TOTAL, FREET4, T3FREE, THYROIDAB in the last 72 hours. Anemia Panel: No results for input(s): VITAMINB12, FOLATE, FERRITIN, TIBC, IRON, RETICCTPCT in the last 72 hours. Urine analysis:    Component Value Date/Time   COLORURINE STRAW (A) 03/04/2011 1342   APPEARANCEUR CLEAR 03/04/2011 1342   LABSPEC 1.005 03/04/2011 1342   PHURINE 6.0 03/04/2011 1342   GLUCOSEU NEGATIVE 03/04/2011 1342   HGBUR NEGATIVE 03/04/2011 1342   BILIRUBINUR n 04/22/2016 1558   KETONESUR NEGATIVE 03/04/2011 1342   PROTEINUR n 04/22/2016 1558   PROTEINUR NEGATIVE 03/04/2011 1342   UROBILINOGEN negative 04/22/2016 1558   UROBILINOGEN 0.2 03/04/2011 1342   NITRITE n 04/22/2016 1558   NITRITE NEGATIVE 03/04/2011 1342   LEUKOCYTESUR Negative 04/22/2016 1558    Radiological Exams on Admission: Dg Chest 2 View  Result Date: 02/13/2018 CLINICAL DATA:  Chest pain, heart palpitations and shortness of breath. EXAM: CHEST - 2 VIEW COMPARISON:  01/18/2015 FINDINGS: The heart size and mediastinal contours are within normal limits. Both lungs are clear. The visualized skeletal structures  are unremarkable. IMPRESSION: Negative exam. Electronically Signed   By: Lorriane Shire M.D.   On: 02/13/2018 13:43    EKG: Independently reviewed. Lateral t wave flattening, otherwise normal  Assessment/Plan Active Problems:   Essential hypertension   Chest pain   CKD (chronic kidney disease) stage 3, GFR 30-59 ml/min (HCC)   # Atypical chest pain - here vitals wnl, well appearing, normal exam, but history and comorbidities (age, race, hx htn, first degree fam hx) yields moderate probability of acs. EKG reassuring, trop neg x2, bnp neg and no signs fluid overload on exam, cxr normal, d dimer wnl and no signs dvt. - tele - am ekg - TTE ordered - NPO - repeat third troponin  # HTN - here bp wnl - continue home amlodipine, rosuvastatin  # GERD - continue home h2 blocker  DVT prophylaxis: heparin, scds Code Status: dnr, confirmed w/ pt  Family Communication: friend wanda Louis [phone]  Disposition Plan: tbd  Consults called: none  Admission status: obs tele  Desma Maxim MD [facility] Pager [phone]  If 7PM-7AM, please contact night-coverage [email or web address] Password [email or web address]  02/14/2018, 12:20 AM

## 2018-02-14 NOTE — Consult Note (Signed)
Cardiology Consultation:   Patient ID: Cristina Sawyer; [id number]; [birth year]   Admit date: 02/13/2018 Date of Consult: 02/14/2018  Primary Care Provider: Denita Lung, MD Primary Cardiologist: New, Dr Radford Pax Primary Electrophysiologist:  n/a   Patient Profile:   Cristina Sawyer is a 77 y.o. female with a hx of HTN, HLD, OA, FH CAD, CKD III, GERD, who is being seen today for the evaluation of chest pain at the request of Dr Cruzita Lederer.  History of Present Illness:   [name] has never had a cardiac evaluation. At one point, her PCP thought she had CREST syndrome but pt saw Rheumatologist and that was not true. Her PCP now agrees that she does not have CREST and took it off her dx list.   She had heart palpitations starting about a week ago. They would go away for as long as a day, but would go on all day at other times. It was a fluttering feeling. She could tell it was there, but no presyncope, CP or SOB. She may have been a little tired or weak, but not bad. No HR or BP recorded. She has had a few flutters since admit, not lasting long.   Thursday night, she developed chest pain. It started just before she went to bed, got worse after going to bed. It reached a 10/10. She started to belch, took Gas-X and sipped ginger ale. Sitting up may have helped. Worse lying down. Radiated to her R arm and jaw. The pain made her SOB and increased w/ deep inspiration. She was diaphoretic at times.   Never had this before, has had gas pains before, not exactly like this.   She got up, drank coffee but got nauseated and stopped, showered and came to the ER when sx did not improve.   In the ER, sx gradually improved but did not resolve. Pain is now 1/10, soreness in her chest. She did not get any rx for the pain.   She is generally active without chest pain or SOB. Walks some in the neighborhood, does gardening and housework. She does not get CP or SOB with this.    Past Medical History:    Diagnosis Date  . Arthritis   . Cataract   . Diverticulosis   . Dyslipidemia   . GERD (gastroesophageal reflux disease)   . HTN (hypertension) 2015  . Osteoporosis    OSTEOPENIA  . Personal history of colonic adenoma 08/09/2008  . Vitamin D deficiency     Past Surgical History:  Procedure Laterality Date  . ABDOMINAL HYSTERECTOMY  1975  . APPENDECTOMY  1975  . COLONOSCOPY    . THYROID SURGERY  2000   to remove nodule     Prior to Admission medications   Medication Sig Start Date End Date Taking? Authorizing Provider  amLODipine (NORVASC) 5 MG tablet Take 1 tablet (5 mg total) by mouth daily. 05/12/17  Yes Denita Lung, MD  aspirin EC 81 MG tablet Take 81 mg by mouth daily.   Yes [provider]  cetirizine (ZYRTEC) 10 MG tablet Take 10 mg by mouth daily as needed for allergies.    Yes [provider]  ranitidine (ZANTAC) 75 MG tablet Take 75 mg by mouth 2 (two) times daily as needed for heartburn.    Yes [provider]  rosuvastatin (CRESTOR) 5 MG tablet Take 1 tablet (5 mg total) by mouth daily. 05/12/17  Yes Denita Lung, MD  omeprazole (PRILOSEC OTC) 20  MG tablet Take 20 mg by mouth daily.    12/11/11  [provider]    Inpatient Medications: Scheduled Meds: . amLODipine  5 mg Oral Daily  . famotidine  10 mg Oral BID  . heparin  5,000 Units Subcutaneous Q8H  . rosuvastatin  5 mg Oral Daily   Continuous Infusions: . dextrose 5 % and 0.45% NaCl 100 mL/hr at 02/14/18 0359   PRN Meds: acetaminophen, ondansetron (ZOFRAN) IV  Allergies:    Allergies  Allergen Reactions  . Codeine     REACTION: itch/nausea  . Ciprofloxacin Hcl Rash    Social History:   Social History   Socioeconomic History  . Marital status: Widowed    Spouse name: Not on file  . Number of children: Not on file  . Years of education: Not on file  . Highest education level: Not on file  Occupational History  . Occupation: Retired from Therapist, music  Social Needs  . Financial resource strain: Not on file  . Food insecurity:    Worry: Not on file    Inability: Not on file  . Transportation needs:    Medical: Not on file    Non-medical: Not on file  Tobacco Use  . Smoking status: Never Smoker  . Smokeless tobacco: Never Used  Substance and Sexual Activity  . Alcohol use: Yes    Alcohol/week: 0.6 oz    Types: 1 Glasses of wine per week  . Drug use: No  . Sexual activity: Yes  Lifestyle  . Physical activity:    Days per week: Not on file    Minutes per session: Not on file  . Stress: Not on file  Relationships  . Social connections:    Talks on phone: Not on file    Gets together: Not on file    Attends religious service: Not on file    Active member of club or organization: Not on file    Attends meetings of clubs or organizations: Not on file    Relationship status: Not on file  . Intimate partner violence:    Fear of current or ex partner: Not on file    Emotionally abused: Not on file    Physically abused: Not on file    Forced sexual activity: Not on file  Other Topics Concern  . Not on file  Social History Narrative   Pt lives alone.     Family History:   Family History  Problem Relation Age of Onset  . Heart disease Sister   . Heart disease Sister        in her 40s  . Colon cancer Neg Hx    Family Status:  Family Status  Relation Name Status  . Mother  Deceased  . Father  Deceased  . Sister  Alive  . Sister  Deceased  . Neg Hx  (Not Specified)    ROS:  Please see the history of present illness.  All other ROS reviewed and negative.     Physical Exam/Data:   Vitals:   02/14/18 0300 02/14/18 0339 02/14/18 0703 02/14/18 0948  BP: 135/65 (!) 174/72 118/62 (!) 116/59  Pulse: (!) 57 72    Resp: 16 19    Temp:  98.9 F (37.2 C) 97.9 F (36.6 C)   TempSrc:  Oral Oral   SpO2: 97% 100%    Weight:  186 lb 4.6 oz (84.5 kg)    Height:  5\' 5"  (1.651 m)  Intake/Output Summary (Last 24  hours) at 02/14/2018 1126 Last data filed at 02/14/2018 0500 Gross per 24 hour  Intake 101.67 ml  Output -  Net 101.67 ml   Filed Weights   02/14/18 0339  Weight: 186 lb 4.6 oz (84.5 kg)   Body mass index is 31 kg/m.  General:  Well nourished, well developed, in no acute distress HEENT: normal Lymph: no adenopathy Neck: no JVD Endocrine:  No thryomegaly Vascular: No carotid bruits; 4/4 extremity pulses 2+, without bruits  Cardiac:  normal S1, S2; RRR; soft murmur Lungs:  clear to auscultation bilaterally, no wheezing, rhonchi or rales  Abd: soft, nontender, no hepatomegaly  Ext: no edema Musculoskeletal:  No deformities, BUE and BLE strength normal and equal Skin: warm and dry  Neuro:  CNs 2-12 intact, no focal abnormalities noted Psych:  Normal affect   EKG:  The EKG was personally reviewed and demonstrates:  SR, HR 96, no acute changes Telemetry:  Telemetry was personally reviewed and demonstrates:  SR, sinus brady, no sig ectopy, few PACs  Relevant CV Studies:  ECHO: ordered  ECHO: 05/03/2017 - Left ventricle: The cavity size was normal. Wall thickness was   increased in a pattern of mild LVH. Systolic function was   vigorous. The estimated ejection fraction was in the range of 65%   to 70%. Wall motion was normal; there were no regional wall   motion abnormalities. Doppler parameters are consistent with   abnormal left ventricular relaxation (grade 1 diastolic   dysfunction). - Aortic valve: Mildly calcified annulus. Trileaflet. - Mitral valve: There was trivial regurgitation. - Right atrium: Central venous pressure (est): 3 mm Hg. - Atrial septum: No defect or patent foramen ovale was identified. - Tricuspid valve: There was trivial regurgitation. - Pulmonary arteries: Systolic pressure could not be accurately   estimated. - Pericardium, extracardiac: There was no pericardial effusion.  Impressions:  - Mild LVH with LVEF 65-70% and grade 1 diastolic  dysfunction.   Trivial mitral regurgitation. Trivial tricuspid regurgitation. No   obvious PFO or ASD.  Laboratory Data:  Chemistry Recent Labs  Lab 02/13/18 1328 02/14/18 0514  NA 142  --   K 3.7  --   CL 105  --   CO2 25  --   GLUCOSE 135*  --   BUN 12  --   CREATININE 1.12* 0.92  CALCIUM 9.9  --   GFRNONAA 46* 59*  GFRAA 53* >60  ANIONGAP 12  --     Lab Results  Component Value Date   ALT 23 05/12/2017   AST 17 05/12/2017   ALKPHOS 96 05/12/2017   BILITOT 0.6 05/12/2017   Hematology Recent Labs  Lab 02/13/18 1328 02/14/18 0514  WBC 10.0 10.7*  RBC 5.36* 4.95  HGB 14.9 13.6  HCT 45.6 41.6  MCV 85.1 84.0  MCH 27.8 27.5  MCHC 32.7 32.7  RDW 14.0 13.8  PLT 246 226   Cardiac Enzymes Recent Labs  Lab 02/14/18 0514  TROPONINI <0.03    Recent Labs  Lab 02/13/18 1342 02/13/18 1746  TROPIPOC 0.00 0.01    BNP Recent Labs  Lab 02/13/18 2131  BNP 36.3    DDimer  Recent Labs  Lab 02/13/18 2131  DDIMER 0.29   TSH:  Lab Results  Component Value Date   TSH 2.982 02/14/2018   Lipids: Lab Results  Component Value Date   CHOL 170 07/14/2017   HDL 85 07/14/2017   LDLCALC 67 07/14/2017   TRIG 97  07/14/2017   CHOLHDL 2.0 07/14/2017   HgbA1c: Lab Results  Component Value Date   HGBA1C 5.7 (H) 02/14/2018   Magnesium: No results found for: MG   Radiology/Studies:  Dg Chest 2 View  Result Date: 02/13/2018 CLINICAL DATA:  Chest pain, heart palpitations and shortness of breath. EXAM: CHEST - 2 VIEW COMPARISON:  01/18/2015 FINDINGS: The heart size and mediastinal contours are within normal limits. Both lungs are clear. The visualized skeletal structures are unremarkable. IMPRESSION: Negative exam. Electronically Signed   By: Lorriane Shire M.D.   On: 02/13/2018 13:43    Assessment and Plan:   1.   Chest pain - Biggest possibilities are CAD and GERD - mult CRFs including CAD in her sister - ez neg so far. - continue to cycle, if ez remain  negative, MV in am  2. GERD - hx indigestion, relieved by belching and/or Gas-X - will try GI cocktail, see if can completely relieve her pain  Otherwise, per IM  Active Problems:   Essential hypertension   CKD (chronic kidney disease) stage 3, GFR 30-59 ml/min (HCC)   For questions or updates, please contact [facility] Please consult [email or web address] for contact info under Cardiology/STEMI.   SignedRosaria Ferries, PA-C  02/14/2018 11:26 AM

## 2018-02-14 NOTE — ED Notes (Signed)
Admitting MD at bedside.

## 2018-02-14 NOTE — Progress Notes (Signed)
Patient seen and examined this morning, admitted overnight by Dr. Si Raider, H&P reviewed and agree with assessment and plan.  In brief, this is a pleasant 77 year old female with hypertension, chronic kidney disease who came in with an episode of palpitations about a week ago, feeling like the heart was fluttering in her chest.  The day prior to admission in the evening she experienced midsternal chest pain radiating into the right shoulder and the right arm, she tried some GI medications for relief but they did not work.  The pain persisted overnight and the next morning patient presented to the emergency room.  She has strong family history of early coronary artery disease.  Chest pain -With typical and atypical symptoms, EKG without acute ischemia, troponins are negative, d-dimer is negative -Cardiology consulted, will continue to cycle cardiac enzymes and if they remain negative plan for Myoview in the morning  Hypertension -BP stable  HLD -on crestor  Cristina Whiston M. Cruzita Lederer, MD [facility] [phone]  If 7PM-7AM, please contact night-coverage [email or web address] Password [email or web address]

## 2018-02-15 ENCOUNTER — Other Ambulatory Visit: Payer: Self-pay | Admitting: Physician Assistant

## 2018-02-15 ENCOUNTER — Observation Stay (HOSPITAL_BASED_OUTPATIENT_CLINIC_OR_DEPARTMENT_OTHER): Payer: Medicare HMO

## 2018-02-15 DIAGNOSIS — R002 Palpitations: Secondary | ICD-10-CM | POA: Diagnosis not present

## 2018-02-15 DIAGNOSIS — Z8673 Personal history of transient ischemic attack (TIA), and cerebral infarction without residual deficits: Secondary | ICD-10-CM | POA: Diagnosis not present

## 2018-02-15 DIAGNOSIS — I10 Essential (primary) hypertension: Secondary | ICD-10-CM | POA: Diagnosis not present

## 2018-02-15 DIAGNOSIS — E559 Vitamin D deficiency, unspecified: Secondary | ICD-10-CM | POA: Diagnosis not present

## 2018-02-15 DIAGNOSIS — I5030 Unspecified diastolic (congestive) heart failure: Secondary | ICD-10-CM

## 2018-02-15 DIAGNOSIS — M341 CR(E)ST syndrome: Secondary | ICD-10-CM | POA: Diagnosis not present

## 2018-02-15 DIAGNOSIS — R079 Chest pain, unspecified: Secondary | ICD-10-CM

## 2018-02-15 DIAGNOSIS — R0609 Other forms of dyspnea: Secondary | ICD-10-CM | POA: Diagnosis not present

## 2018-02-15 DIAGNOSIS — Z800 Family history of malignant neoplasm of digestive organs: Secondary | ICD-10-CM | POA: Diagnosis not present

## 2018-02-15 DIAGNOSIS — N183 Chronic kidney disease, stage 3 (moderate): Secondary | ICD-10-CM | POA: Diagnosis not present

## 2018-02-15 DIAGNOSIS — E785 Hyperlipidemia, unspecified: Secondary | ICD-10-CM | POA: Diagnosis not present

## 2018-02-15 DIAGNOSIS — K219 Gastro-esophageal reflux disease without esophagitis: Secondary | ICD-10-CM | POA: Diagnosis not present

## 2018-02-15 DIAGNOSIS — I129 Hypertensive chronic kidney disease with stage 1 through stage 4 chronic kidney disease, or unspecified chronic kidney disease: Secondary | ICD-10-CM | POA: Diagnosis not present

## 2018-02-15 DIAGNOSIS — Z8249 Family history of ischemic heart disease and other diseases of the circulatory system: Secondary | ICD-10-CM | POA: Diagnosis not present

## 2018-02-15 LAB — NM MYOCAR MULTI W/SPECT W/WALL MOTION / EF
CHL CUP MPHR: 143 {beats}/min
CHL CUP RESTING HR STRESS: 59 {beats}/min
CSEPED: 5 min
Estimated workload: 1 METS
Exercise duration (sec): 2 s
Peak HR: 96 {beats}/min
Percent HR: 67 %

## 2018-02-15 MED ORDER — REGADENOSON 0.4 MG/5ML IV SOLN
0.4000 mg | Freq: Once | INTRAVENOUS | Status: AC
  Administered 2018-02-15: 0.4 mg via INTRAVENOUS
  Filled 2018-02-15: qty 5

## 2018-02-15 MED ORDER — REGADENOSON 0.4 MG/5ML IV SOLN
INTRAVENOUS | Status: DC
Start: 2018-02-15 — End: 2018-02-15
  Filled 2018-02-15: qty 5

## 2018-02-15 MED ORDER — TECHNETIUM TC 99M TETROFOSMIN IV KIT
30.0000 | PACK | Freq: Once | INTRAVENOUS | Status: AC | PRN
  Administered 2018-02-15: 30 via INTRAVENOUS

## 2018-02-15 MED ORDER — TECHNETIUM TC 99M TETROFOSMIN IV KIT
10.0000 | PACK | Freq: Once | INTRAVENOUS | Status: AC | PRN
  Administered 2018-02-15: 10 via INTRAVENOUS

## 2018-02-15 MED ORDER — GI COCKTAIL ~~LOC~~: 30.0000 mL | Freq: Two times a day (BID) | ORAL | 1 refills | Status: DC | PRN

## 2018-02-15 NOTE — Discharge Summary (Signed)
Physician Discharge Summary  Cristina Sawyer FWY:[id number] DOB: 1940-11-08 DOA: 02/13/2018  PCP: Cristina Lung, MD  Admit date: 02/13/2018 Discharge date: 02/15/2018  Admitted From: home Disposition:  home  Recommendations for Outpatient Follow-up:  1. Follow up with PCP in 1-2 weeks 2. Will need referral to gastroenterology as an outpatient 3. Follow-up with Dr. Radford Sawyer in 2 to 3 weeks  Home Health: None Equipment/Devices: None  Discharge Condition: Stable CODE STATUS: DNR Diet recommendation: Heart healthy  HPI: Per Dr. Gwen Sawyer is a 77 y.o. female with medical history significant for htn and ckd3 who presents with approximately one week of symptoms. First time this has happened. Denies history heart disease but does endorse strong family history (2 sisters with CAD). Says has been experiencing most days chest pressure and palpitations. Sometimes associated w/ exertion but other times not and many times present while resting. Yesterday sharper substernal pain and radiation to right arm and neck, this while in bed. Symptoms not currently present. No cough or sob. No fevers, no vomiting or diarrhea. Does have history gerd, thought yesterday Sawyer symptoms might be gerd. Denies hx venous thrombus or recent immobility or leg swelling.   Hospital Course: Chest pain -patient was admitted to the hospital with chest pain.  Sawyer pain had typical and atypical components.  Cardiology was consulted and followed patient while hospitalized.  Sawyer EKG was without acute ischemia, Sawyer cardiac enzymes have remained negative x3.  D-dimer was normal.  She underwent a 2D echo which showed normal EF 60 to 27%, grade 1 diastolic dysfunction.  She underwent a stress test on 6/2 which showed no reversible ischemia, normal left ventricular wall motion with an EF of 73%, and overall risk stratification was low. Reflux / dysphagia -patient reports intermittently over quite some time uncomfortable reflux  symptoms and occasional dysphagia to foods and at times she needs to "put herself on a soft food diet".  She has currently no symptoms but has been having some few weeks prior to this hospital stay.  I recommend that she follows up with Sawyer primary care provider soon for further work-up vs gastroenterology referral. Hypertension -BP stable HLD -on crestor   Discharge Diagnoses:  Active Problems:   Essential hypertension   Chest pain   CKD (chronic kidney disease) stage 3, GFR 30-59 ml/min (HCC)   Exertional dyspnea  Discharge Instructions   Allergies as of 02/15/2018      Reactions   Codeine    REACTION: itch/nausea   Ciprofloxacin Hcl Rash      Medication List    TAKE these medications   amLODipine 5 MG tablet Commonly known as:  NORVASC Take 1 tablet (5 mg total) by mouth daily.   aspirin EC 81 MG tablet Take 81 mg by mouth daily.   cetirizine 10 MG tablet Commonly known as:  ZYRTEC Take 10 mg by mouth daily as needed for allergies.   gi cocktail Susp suspension Take 30 mLs by mouth 2 (two) times daily as needed for indigestion. Shake well.   ranitidine 75 MG tablet Commonly known as:  ZANTAC Take 75 mg by mouth 2 (two) times daily as needed for heartburn.   rosuvastatin 5 MG tablet Commonly known as:  CRESTOR Take 1 tablet (5 mg total) by mouth daily.      Follow-up Information    Cristina Lung, MD. Schedule an appointment as soon as possible for a visit in 3 week(s).   Specialty:  Family Medicine  Contact information: [place] [address] [phone]        Cristina Margarita, MD .   Specialty:  Cardiology Contact information: [address] [place] [address] [phone]           Consultations:  Cardiology   Procedures/Studies:  2D echo  Study Conclusions - Left ventricle: The cavity size was normal. There was mild focal basal hypertrophy of the septum. Systolic function was normal. The  estimated ejection fraction was in the range of 60% to 65%. Wall motion was normal; there were no regional wall motion abnormalities. There was an increased relative contribution of atrial contraction to ventricular filling. Doppler parameters are consistent with abnormal left ventricular relaxation (grade 1 diastolic dysfunction). - Aortic valve: Trileaflet; mildly thickened, mildly calcified leaflets. There was trivial regurgitation. - Tricuspid valve: There was trivial regurgitation. - Pulmonary arteries: Systolic pressure could not be accurately estimated.   Dg Chest 2 View  Result Date: 02/13/2018 CLINICAL DATA:  Chest pain, heart palpitations and shortness of breath. EXAM: CHEST - 2 VIEW COMPARISON:  01/18/2015 FINDINGS: The heart size and mediastinal contours are within normal limits. Both lungs are clear. The visualized skeletal structures are unremarkable. IMPRESSION: Negative exam. Electronically Signed   By: Lorriane Shire M.D.   On: 02/13/2018 13:43   Nm Myocar Multi W/spect W/wall Motion / Ef  Result Date: 02/15/2018 CLINICAL DATA:  Chest pain EXAM: MYOCARDIAL IMAGING WITH SPECT (REST AND PHARMACOLOGIC-STRESS) GATED LEFT VENTRICULAR WALL MOTION STUDY LEFT VENTRICULAR EJECTION FRACTION TECHNIQUE: Standard myocardial SPECT imaging was performed after resting intravenous injection of 10 mCi Tc-14m tetrofosmin. Subsequently, intravenous infusion of Lexiscan was performed under the supervision of the Cardiology staff. At peak effect of the drug, 30 mCi Tc-14m tetrofosmin was injected intravenously and standard myocardial SPECT imaging was performed. Quantitative gated imaging was also performed to evaluate left ventricular wall motion, and estimate left ventricular ejection fraction. COMPARISON:  None. FINDINGS: Perfusion: Evaluation of the inferolateral wall is mildly limited due to subdiaphragmatic activity greater at rest than stress. This gives the appearance of more uptake in the  inferolateral wall on rest than stress which is thought to be artifactual. No significant reversible defect identified. Wall Motion: Normal left ventricular wall motion. No left ventricular dilation. Left Ventricular Ejection Fraction: 73 % End diastolic volume 67 ml End systolic volume 18 ml IMPRESSION: 1. No reversible ischemia or infarction. 2. Normal left ventricular wall motion. 3. Left ventricular ejection fraction 73% 4. Non invasive risk stratification*: Low *2012 Appropriate Use Criteria for Coronary Revascularization Focused Update: J Am Coll Cardiol. 0737;10(6):269-485. http://content.airportbarriers.com.aspx?articleid=[email or web address] Electronically Signed   By: Dorise Bullion III M.D   On: 02/15/2018 13:05     Subjective: - no chest pain, shortness of breath, no abdominal pain, nausea or vomiting.   Discharge Exam: Vitals:   02/15/18 1115 02/15/18 1255  BP: 138/69 (!) 116/58  Pulse:    Resp:    Temp:  98 F (36.7 C)  SpO2:  98%    General: Pt is alert, awake, not in acute distress Cardiovascular: RRR, S1/S2 +, no rubs, no gallops Respiratory: CTA bilaterally, no wheezing, no rhonchi Abdominal: Soft, NT, ND, bowel sounds + Extremities: no edema, no cyanosis   The results of significant diagnostics from this hospitalization (including imaging, microbiology, ancillary and laboratory) are listed below for reference.     Microbiology: Recent Results (from the past 240 hour(s))  MRSA PCR Screening     Status: None  Collection Time: 02/14/18  5:38 AM  Result Value Ref Range Status   MRSA by PCR NEGATIVE NEGATIVE Final    Comment:        The GeneXpert MRSA Assay (FDA approved for NASAL specimens only), is one component of a comprehensive MRSA colonization surveillance program. It is not intended to diagnose MRSA infection nor to guide or monitor treatment for MRSA infections. Performed at [facility], [place] [address] [place], [place] [address]      Labs: BNP  (last 3 results) Recent Labs    02/13/18 2131  BNP 58.0   Basic Metabolic Panel: Recent Labs  Lab 02/13/18 1328 02/14/18 0514  NA 142  --   K 3.7  --   CL 105  --   CO2 25  --   GLUCOSE 135*  --   BUN 12  --   CREATININE 1.12* 0.92  CALCIUM 9.9  --    Liver Function Tests: No results for input(s): AST, ALT, ALKPHOS, BILITOT, PROT, ALBUMIN in the last 168 hours. No results for input(s): LIPASE, AMYLASE in the last 168 hours. No results for input(s): AMMONIA in the last 168 hours. CBC: Recent Labs  Lab 02/13/18 1328 02/14/18 0514  WBC 10.0 10.7*  HGB 14.9 13.6  HCT 45.6 41.6  MCV 85.1 84.0  PLT 246 226   Cardiac Enzymes: Recent Labs  Lab 02/14/18 0514 02/14/18 1254 02/14/18 1826  TROPONINI <0.03 <0.03 <0.03   BNP: Invalid input(s): POCBNP CBG: No results for input(s): GLUCAP in the last 168 hours. D-Dimer Recent Labs    02/13/18 2131  DDIMER 0.29   Hgb A1c Recent Labs    02/14/18 0514  HGBA1C 5.7*   Lipid Profile No results for input(s): CHOL, HDL, LDLCALC, TRIG, CHOLHDL, LDLDIRECT in the last 72 hours. Thyroid function studies Recent Labs    02/14/18 0514  TSH 2.982   Anemia work up No results for input(s): VITAMINB12, FOLATE, FERRITIN, TIBC, IRON, RETICCTPCT in the last 72 hours. Urinalysis    Component Value Date/Time   COLORURINE STRAW (A) 03/04/2011 1342   APPEARANCEUR CLEAR 03/04/2011 1342   LABSPEC 1.005 03/04/2011 1342   PHURINE 6.0 03/04/2011 1342   GLUCOSEU NEGATIVE 03/04/2011 1342   HGBUR NEGATIVE 03/04/2011 1342   BILIRUBINUR n 04/22/2016 1558   KETONESUR NEGATIVE 03/04/2011 1342   PROTEINUR n 04/22/2016 1558   PROTEINUR NEGATIVE 03/04/2011 1342   UROBILINOGEN negative 04/22/2016 1558   UROBILINOGEN 0.2 03/04/2011 1342   NITRITE n 04/22/2016 1558   NITRITE NEGATIVE 03/04/2011 1342   LEUKOCYTESUR Negative 04/22/2016 1558   Sepsis Labs Invalid input(s): PROCALCITONIN,  WBC,  LACTICIDVEN   Time coordinating discharge:  35 minutes  SIGNED:  Marzetta Board, MD  [facility] 02/15/2018, 2:14 PM Pager [phone]  If 7PM-7AM, please contact night-coverage [email or web address] Password [email or web address]

## 2018-02-15 NOTE — Progress Notes (Signed)
    Echocardiogram and Myoview results reviewed with Dr. Radford Pax.  Patient is okay for discharge from a cardiology standpoint.  She needs an event monitor, this will be ordered and she will be contacted by the office as an outpatient.  Follow-up with Dr. Radford Pax after the monitor.  Rosaria Ferries, PA-C 02/15/2018 3:08 PM Beeper [phone]

## 2018-02-15 NOTE — Progress Notes (Signed)
Pt discharged home with all belongings. Discharge instructions given, all questions answered.

## 2018-02-15 NOTE — Progress Notes (Signed)
  Echocardiogram 2D Echocardiogram has been performed.  Jennette Dubin 02/15/2018, 12:34 PM

## 2018-02-15 NOTE — Progress Notes (Addendum)
Progress Note  Patient Name: Cristina Sawyer Date of Encounter: 02/15/2018  Primary Cardiologist:  Fransico Him, MD  Subjective   Still having occasional quick squeezes of chest pain.  Inpatient Medications    Scheduled Meds: . amLODipine  5 mg Oral Daily  . famotidine  10 mg Oral BID  . heparin  5,000 Units Subcutaneous Q8H  . regadenoson      . rosuvastatin  5 mg Oral Daily   Continuous Infusions:  PRN Meds: acetaminophen, gi cocktail, ondansetron (ZOFRAN) IV   Vital Signs    Vitals:   02/14/18 2002 02/15/18 0408 02/15/18 0747 02/15/18 1014  BP: 127/61 (!) 116/53 129/66 126/67  Pulse: 73 71    Resp: 15 14 18    Temp: 98.4 F (36.9 C) 98.3 F (36.8 C) 98.2 F (36.8 C)   TempSrc: Oral Oral Oral   SpO2: 100% 98% 98%   Weight:      Height:        Intake/Output Summary (Last 24 hours) at 02/15/2018 1112 Last data filed at 02/15/2018 0600 Gross per 24 hour  Intake 3202 ml  Output 1 ml  Net 3201 ml   Filed Weights   02/14/18 0339  Weight: 186 lb 4.6 oz (84.5 kg)    Telemetry    SR, seen in nuc med - Personally Reviewed  ECG    06/02, sinus brady, HR 56, no acute ischemic changes - Personally Reviewed  Physical Exam   General: Well developed, well nourished, female appearing in no acute distress. Head: Normocephalic, atraumatic.  Neck: Supple without bruits, JVD not elevated. Lungs:  Resp regular and unlabored, CTA. Heart: RRR, S1, S2, no S3, S4, or murmur; no rub. Abdomen: Soft, non-tender, non-distended with normoactive bowel sounds. No hepatomegaly. No rebound/guarding. No obvious abdominal masses. Extremities: No clubbing, cyanosis, no edema. Distal pedal pulses are 2+ bilaterally. Neuro: Alert and oriented X 3. Moves all extremities spontaneously. Psych: Normal affect.  Labs    Hematology Recent Labs  Lab 02/13/18 1328 02/14/18 0514  WBC 10.0 10.7*  RBC 5.36* 4.95  HGB 14.9 13.6  HCT 45.6 41.6  MCV 85.1 84.0  MCH 27.8 27.5  MCHC 32.7  32.7  RDW 14.0 13.8  PLT 246 226    Chemistry Recent Labs  Lab 02/13/18 1328 02/14/18 0514  NA 142  --   K 3.7  --   CL 105  --   CO2 25  --   GLUCOSE 135*  --   BUN 12  --   CREATININE 1.12* 0.92  CALCIUM 9.9  --   GFRNONAA 46* 59*  GFRAA 53* >60  ANIONGAP 12  --      Cardiac Enzymes Recent Labs  Lab 02/14/18 0514 02/14/18 1254 02/14/18 1826  TROPONINI <0.03 <0.03 <0.03    Recent Labs  Lab 02/13/18 1342 02/13/18 1746  TROPIPOC 0.00 0.01     BNP Recent Labs  Lab 02/13/18 2131  BNP 36.3     DDimer  Recent Labs  Lab 02/13/18 2131  DDIMER 0.29     Radiology    Dg Chest 2 View  Result Date: 02/13/2018 CLINICAL DATA:  Chest pain, heart palpitations and shortness of breath. EXAM: CHEST - 2 VIEW COMPARISON:  01/18/2015 FINDINGS: The heart size and mediastinal contours are within normal limits. Both lungs are clear. The visualized skeletal structures are unremarkable. IMPRESSION: Negative exam. Electronically Signed   By: Lorriane Shire M.D.   On: 02/13/2018 13:43     Cardiac  Studies   ECHO:  ordered  Patient Profile     77 y.o. female w/ hx  HTN, HLD, OA, FH CAD, CKD III, GERD, was admitted 05/31 with chest pain and palpitations.  Assessment & Plan    1. Chest pain - intermittent sx are very brief. - ez and ECG are not acute - MV today - f/u on echo  2. Palpitations - will review telemetry  - nothing but SR, Sinus brady on saved strips.  Otherwise, per IM Active Problems:   Essential hypertension   Chest pain   CKD (chronic kidney disease) stage 3, GFR 30-59 ml/min (HCC)   Exertional dyspnea    Jonetta Speak , PA-C 11:12 AM 02/15/2018 Pager: [phone]

## 2018-02-16 ENCOUNTER — Telehealth: Payer: Self-pay | Admitting: Family Medicine

## 2018-02-16 NOTE — Telephone Encounter (Signed)
Pt was called concerning her recent hospital stay. Pt states she is better and is not having any chest pains. Current and discharge medications were reconciled. Pt continues on amlodipine, Asprin and rosuvastatin which she takes daily. She also continues to take cetirizine and ranitidine as needed. The hospital placed her on a GI Cocktail. She has not picked that up from pharmacy yet but has made arraignments for someone to pick that up for her today. She states she does not need any refills on any medications at this time.   Pt did state that she caught a cold while in the hospital. Her symptoms include a sore throat, running nose and some sneezing. She is taking OTC coricidin. She wants to made sure that is ok. Pt can be reached at [phone].  Hospital follow up was scheduled for 02/25/2018. Pt was advised to bring all medications with her and pt verified understanding. Pt was also informed of after hour/weekend protocol and pt verified understanding.

## 2018-02-16 NOTE — Telephone Encounter (Signed)
Let pt know that [facility] is a pharmacy she might want to try and if they couldn't help than I would ask Dr. Redmond School.

## 2018-02-16 NOTE — Telephone Encounter (Signed)
Let her know that it is okay to treat the cold symptoms and if she has trouble, call me

## 2018-02-23 ENCOUNTER — Ambulatory Visit (INDEPENDENT_AMBULATORY_CARE_PROVIDER_SITE_OTHER): Payer: Medicare HMO

## 2018-02-23 DIAGNOSIS — R002 Palpitations: Secondary | ICD-10-CM | POA: Diagnosis not present

## 2018-02-25 ENCOUNTER — Encounter: Payer: Self-pay | Admitting: Family Medicine

## 2018-02-25 ENCOUNTER — Ambulatory Visit (INDEPENDENT_AMBULATORY_CARE_PROVIDER_SITE_OTHER): Payer: Medicare HMO | Admitting: Family Medicine

## 2018-02-25 VITALS — BP 112/74 | HR 82 | Temp 97.9°F | Wt 192.0 lb

## 2018-02-25 DIAGNOSIS — K219 Gastro-esophageal reflux disease without esophagitis: Secondary | ICD-10-CM

## 2018-02-25 DIAGNOSIS — R079 Chest pain, unspecified: Secondary | ICD-10-CM

## 2018-02-25 NOTE — Patient Instructions (Signed)
Switch to either 2 Prilosec per day or 2 Nexium per day to get your reflux symptoms under control and then once are under control we will see if you can get by with every other day or potentially even every third day if that does not work.  Come back there are other things that can do

## 2018-02-25 NOTE — Progress Notes (Signed)
   Subjective:    Patient ID: Cristina Sawyer, female    DOB: 06-13-1941, 77 y.o.   MRN: [id number]  HPI She is here for follow-up visit after recent hospitalization and evaluation for chest pain.  The hospital discharge summary was reviewed in her presence.  The cardiac eval was negative.  She does have some reflux symptoms and does note occasionally having food getting stuck in the lower esophageal area.  She notes that more liquid foods difficult easier.  Apparently hiatus hernia was mentioned.  She does have a previous history of reflux. She was recently seen by cardiology and a cardiac monitor has been applied.  She apparently will be using this for approximately 1 month.  Review of Systems     Objective:   Physical Exam Alert and in no distress otherwise not examined       Assessment & Plan:  Gastroesophageal reflux disease without esophagitis  Chest pain, unspecified type Switch to either 2 Prilosec per day or 2 Nexium per day to get your reflux symptoms under control and then once are under control we will see if you can get by with every other day or potentially even every third day if that does not work.  Come back there are other things that can do. Follow-up with cardiology pending results of the event monitor. Over 25 minutes, greater than 50% spent in counseling and coordination of care.

## 2018-03-16 DIAGNOSIS — H52203 Unspecified astigmatism, bilateral: Secondary | ICD-10-CM | POA: Diagnosis not present

## 2018-03-16 DIAGNOSIS — H524 Presbyopia: Secondary | ICD-10-CM | POA: Diagnosis not present

## 2018-03-16 DIAGNOSIS — H5213 Myopia, bilateral: Secondary | ICD-10-CM | POA: Diagnosis not present

## 2018-03-26 ENCOUNTER — Encounter: Payer: Self-pay | Admitting: Cardiology

## 2018-04-14 ENCOUNTER — Ambulatory Visit: Payer: Medicare HMO | Admitting: Cardiology

## 2018-05-05 ENCOUNTER — Other Ambulatory Visit: Payer: Self-pay | Admitting: Family Medicine

## 2018-05-05 DIAGNOSIS — G459 Transient cerebral ischemic attack, unspecified: Secondary | ICD-10-CM

## 2018-05-19 ENCOUNTER — Ambulatory Visit (INDEPENDENT_AMBULATORY_CARE_PROVIDER_SITE_OTHER): Payer: Medicare HMO | Admitting: Family Medicine

## 2018-05-19 ENCOUNTER — Encounter: Payer: Self-pay | Admitting: Family Medicine

## 2018-05-19 VITALS — BP 118/76 | HR 84 | Temp 97.7°F | Ht 63.0 in | Wt 193.6 lb

## 2018-05-19 DIAGNOSIS — Z0000 Encounter for general adult medical examination without abnormal findings: Secondary | ICD-10-CM

## 2018-05-19 DIAGNOSIS — H259 Unspecified age-related cataract: Secondary | ICD-10-CM | POA: Insufficient documentation

## 2018-05-19 DIAGNOSIS — I10 Essential (primary) hypertension: Secondary | ICD-10-CM | POA: Diagnosis not present

## 2018-05-19 DIAGNOSIS — N183 Chronic kidney disease, stage 3 unspecified: Secondary | ICD-10-CM

## 2018-05-19 DIAGNOSIS — Z86010 Personal history of colonic polyps: Secondary | ICD-10-CM | POA: Diagnosis not present

## 2018-05-19 DIAGNOSIS — G459 Transient cerebral ischemic attack, unspecified: Secondary | ICD-10-CM

## 2018-05-19 DIAGNOSIS — J301 Allergic rhinitis due to pollen: Secondary | ICD-10-CM | POA: Diagnosis not present

## 2018-05-19 DIAGNOSIS — R5383 Other fatigue: Secondary | ICD-10-CM | POA: Diagnosis not present

## 2018-05-19 DIAGNOSIS — E785 Hyperlipidemia, unspecified: Secondary | ICD-10-CM | POA: Insufficient documentation

## 2018-05-19 DIAGNOSIS — K219 Gastro-esophageal reflux disease without esophagitis: Secondary | ICD-10-CM

## 2018-05-19 MED ORDER — ROSUVASTATIN CALCIUM 5 MG PO TABS: 5.0000 mg | ORAL_TABLET | Freq: Every day | ORAL | 3 refills | Status: DC

## 2018-05-19 MED ORDER — AMLODIPINE BESYLATE 5 MG PO TABS: 5.0000 mg | ORAL_TABLET | Freq: Every day | ORAL | 3 refills | Status: DC

## 2018-05-19 NOTE — Patient Instructions (Addendum)
20 minutes of something physical daily or 150 minutes a week Cut back on "white food"  Ms. Tedesco , Thank you for taking time to come for your Medicare Wellness Visit. I appreciate your ongoing commitment to your health goals. Please review the following plan we discussed and let me know if I can assist you in the future.   These are the goals we discussed: Goals   None     This is a list of the screening recommended for you and due dates:  Health Maintenance  Topic Date Due  . Flu Shot  04/16/2018  . Tetanus Vaccine  07/13/2018  . Colon Cancer Screening  03/16/2019  . DEXA scan (bone density measurement)  Completed  . Pneumonia vaccines  Completed

## 2018-05-19 NOTE — Progress Notes (Signed)
Cristina Sawyer is a 77 y.o. female who presents for annual wellness visit, CPE and follow-up on chronic medical conditions.  She does complain of some slight fatigue but admits to not being as physically active as she has been in the past.  Her reflux is under good control with the use of Zantac on an as-needed basis.  She does have a history of colonic polyps and will have a colonoscopy repeat next year.  Her allergies seem to be under good control.  She does have cataracts and is being followed closely by ophthalmology.  Has had no more difficulty with any CNS type symptoms.  She continues on Crestor and is having no difficulty with that.  Her allergies seem to be under good control.  She continues on amlodipine without difficulty.  She does have a history of CKD  Immunizations and Health Maintenance Immunization History  Administered Date(s) Administered  . Influenza, High Dose Seasonal PF 08/06/2013, 07/04/2015, 07/02/2016, 05/12/2017  . Pneumococcal Conjugate-13 01/17/2014  . Pneumococcal Polysaccharide-23 07/13/2008, 01/09/2016  . Tdap 07/13/2008   Health Maintenance Due  Topic Date Due  . INFLUENZA VACCINE  04/16/2018    Last Pap smear: 4-5-years ago Last mammogram: 4-5 years ago Last colonoscopy:five years ago Last DEXA: five years ago Dentist:one year  Ophtho: three month Exercise:  Walking   Other doctors caring for patient include:Athar,Shapiro,Gesner Advanced directives:    Depression screen:  See questionnaire below.  Depression screen [facility] 2/9 05/19/2018 04/01/2017 01/09/2016 01/23/2015  Decreased Interest 0 0 0 0  Down, Depressed, Hopeless 0 0 0 0  PHQ - 2 Score 0 0 0 0    Fall Risk Screen: see questionnaire below. Fall Risk  05/19/2018 04/01/2017 01/09/2016 01/23/2015  Falls in the past year? No No No No    ADL screen:  See questionnaire below Functional Status Survey: Is the patient deaf or have difficulty hearing?: No Does the patient have difficulty seeing, even when  wearing glasses/contacts?: No Does the patient have difficulty concentrating, remembering, or making decisions?: No Does the patient have difficulty walking or climbing stairs?: No Does the patient have difficulty dressing or bathing?: No Does the patient have difficulty doing errands alone such as visiting a doctor's office or shopping?: No   Review of Systems Constitutional: -, -unexpected weight change, -anorexia, -fatigue Allergy: -sneezing, -itching, -congestion Dermatology: denies changing moles, rash, lumps ENT: -runny nose, -ear pain, -sore throat,  Cardiology:  -chest pain, -palpitations, -orthopnea, Respiratory: -cough, -shortness of breath, -dyspnea on exertion, -wheezing,  Gastroenterology: -abdominal pain, -nausea, -vomiting, -diarrhea, -constipation, -dysphagia Hematology: -bleeding or bruising problems Musculoskeletal: -arthralgias, -myalgias, -joint swelling, -back pain, - Ophthalmology: -vision changes,  Urology: -dysuria, -difficulty urinating,  -urinary frequency, -urgency, incontinence Neurology: -, -numbness, , -memory loss, -falls, -dizziness    PHYSICAL EXAM:   General Appearance: Alert, cooperative, no distress, appears stated age Head: Normocephalic, without obvious abnormality, atraumatic Eyes: PERRL, conjunctiva/corneas clear, EOM's intact, fundi benign Ears: Normal TM's and external ear canals Nose: Nares normal, mucosa normal, no drainage or sinus tenderness Throat: Lips, mucosa, and tongue normal; teeth and gums normal Neck: Supple, no lymphadenopathy;  thyroid:  no enlargement/tenderness/nodules; no carotid bruit or JVD Lungs: Clear to auscultation bilaterally without wheezes, rales or ronchi; respirations unlabored Heart: Regular rate and rhythm, S1 and S2 normal, no murmur, rubor gallop Abdomen: Soft, non-tender, nondistended, normoactive bowel sounds,  no masses, no hepatosplenomegaly Extremities: No clubbing, cyanosis or edema Pulses: 2+ and  symmetric all extremities Skin:  Skin color, texture,  turgor normal, no rashes or lesions Lymph nodes: Cervical, supraclavicular, and axillary nodes normal Neurologic:  CNII-XII intact, normal strength, sensation and gait; reflexes 2+ and symmetric throughout Psych: Normal mood, affect, hygiene and grooming.  ASSESSMENT/PLAN: Routine general medical examination at a health care facility - Plan: CBC with Differential/Platelet, Comprehensive metabolic panel, Lipid panel  Gastroesophageal reflux disease without esophagitis  Seasonal allergic rhinitis due to pollen  CKD (chronic kidney disease) stage 3, GFR 30-59 ml/min (HCC)  Essential hypertension - Plan: CBC with Differential/Platelet, Comprehensive metabolic panel, amLODipine (NORVASC) 5 MG tablet  Personal history of colonic adenoma  Transient cerebral ischemia, unspecified type - Plan: CBC with Differential/Platelet, Comprehensive metabolic panel, Lipid panel, rosuvastatin (CRESTOR) 5 MG tablet  Fatigue, unspecified type - Plan: CBC with Differential/Platelet, Comprehensive metabolic panel  Hyperlipidemia, unspecified hyperlipidemia type - Plan: Lipid panel She will continue on her present medication regimen.  Explained that her fatigue could easily be from deconditioning.  Encouraged her to get 20 minutes of something physical on a daily basis or 150 minutes a week.  Also discussed cutting back on carbohydrates.  Her medications were renewed.  We will follow-up with gastroenterology next year.  Continue on as-needed basis of Zantac.     Medicare Attestation I have personally reviewed: The patient's medical and social history Their use of alcohol, tobacco or illicit drugs Their current medications and supplements The patient's functional ability including ADLs,fall risks, home safety risks, cognitive, and hearing and visual impairment Diet and physical activities Evidence for depression or mood disorders  The patient's weight,  height, and BMI have been recorded in the chart.  I have made referrals, counseling, and provided education to the patient based on review of the above and I have provided the patient with a written personalized care plan for preventive services.     Jill Alexanders, MD   05/19/2018

## 2018-05-20 LAB — CBC WITH DIFFERENTIAL/PLATELET
BASOS ABS: 0.1 10*3/uL (ref 0.0–0.2)
BASOS: 1 %
EOS (ABSOLUTE): 0.3 10*3/uL (ref 0.0–0.4)
EOS: 3 %
HEMATOCRIT: 44.8 % (ref 34.0–46.6)
HEMOGLOBIN: 14.8 g/dL (ref 11.1–15.9)
Immature Grans (Abs): 0 10*3/uL (ref 0.0–0.1)
Immature Granulocytes: 0 %
LYMPHS ABS: 3.2 10*3/uL — AB (ref 0.7–3.1)
Lymphs: 33 %
MCH: 28.3 pg (ref 26.6–33.0)
MCHC: 33 g/dL (ref 31.5–35.7)
MCV: 86 fL (ref 79–97)
MONOCYTES: 7 %
Monocytes Absolute: 0.7 10*3/uL (ref 0.1–0.9)
NEUTROS ABS: 5.5 10*3/uL (ref 1.4–7.0)
Neutrophils: 56 %
Platelets: 251 10*3/uL (ref 150–450)
RBC: 5.23 x10E6/uL (ref 3.77–5.28)
RDW: 13.8 % (ref 12.3–15.4)
WBC: 9.7 10*3/uL (ref 3.4–10.8)

## 2018-05-20 LAB — COMPREHENSIVE METABOLIC PANEL
A/G RATIO: 1.7 (ref 1.2–2.2)
ALBUMIN: 4.5 g/dL (ref 3.5–4.8)
ALK PHOS: 82 IU/L (ref 39–117)
ALT: 16 IU/L (ref 0–32)
AST: 20 IU/L (ref 0–40)
BILIRUBIN TOTAL: 0.5 mg/dL (ref 0.0–1.2)
BUN / CREAT RATIO: 12 (ref 12–28)
BUN: 14 mg/dL (ref 8–27)
CHLORIDE: 106 mmol/L (ref 96–106)
CO2: 25 mmol/L (ref 20–29)
Calcium: 10.4 mg/dL — ABNORMAL HIGH (ref 8.7–10.3)
Creatinine, Ser: 1.16 mg/dL — ABNORMAL HIGH (ref 0.57–1.00)
GFR calc non Af Amer: 46 mL/min/{1.73_m2} — ABNORMAL LOW (ref >59)
GFR, EST AFRICAN AMERICAN: 52 mL/min/{1.73_m2} — AB (ref >59)
GLOBULIN, TOTAL: 2.6 g/dL (ref 1.5–4.5)
Glucose: 93 mg/dL (ref 65–99)
Potassium: 4.7 mmol/L (ref 3.5–5.2)
SODIUM: 145 mmol/L — AB (ref 134–144)
Total Protein: 7.1 g/dL (ref 6.0–8.5)

## 2018-05-20 LAB — LIPID PANEL
CHOLESTEROL TOTAL: 162 mg/dL (ref 100–199)
Chol/HDL Ratio: 2.3 ratio (ref 0.0–4.4)
HDL: 72 mg/dL (ref >39)
LDL Calculated: 77 mg/dL (ref 0–99)
Triglycerides: 66 mg/dL (ref 0–149)
VLDL Cholesterol Cal: 13 mg/dL (ref 5–40)

## 2018-07-20 DIAGNOSIS — R69 Illness, unspecified: Secondary | ICD-10-CM | POA: Diagnosis not present

## 2018-07-29 DIAGNOSIS — G8929 Other chronic pain: Secondary | ICD-10-CM | POA: Diagnosis not present

## 2018-07-29 DIAGNOSIS — E669 Obesity, unspecified: Secondary | ICD-10-CM | POA: Diagnosis not present

## 2018-07-29 DIAGNOSIS — K219 Gastro-esophageal reflux disease without esophagitis: Secondary | ICD-10-CM | POA: Diagnosis not present

## 2018-07-29 DIAGNOSIS — J309 Allergic rhinitis, unspecified: Secondary | ICD-10-CM | POA: Diagnosis not present

## 2018-07-29 DIAGNOSIS — M1990 Unspecified osteoarthritis, unspecified site: Secondary | ICD-10-CM | POA: Diagnosis not present

## 2018-07-29 DIAGNOSIS — I10 Essential (primary) hypertension: Secondary | ICD-10-CM | POA: Diagnosis not present

## 2018-07-29 DIAGNOSIS — E785 Hyperlipidemia, unspecified: Secondary | ICD-10-CM | POA: Diagnosis not present

## 2018-07-29 DIAGNOSIS — H547 Unspecified visual loss: Secondary | ICD-10-CM | POA: Diagnosis not present

## 2018-07-29 DIAGNOSIS — K5900 Constipation, unspecified: Secondary | ICD-10-CM | POA: Diagnosis not present

## 2018-07-29 DIAGNOSIS — H269 Unspecified cataract: Secondary | ICD-10-CM | POA: Diagnosis not present

## 2018-08-21 ENCOUNTER — Encounter: Payer: Self-pay | Admitting: Medical

## 2018-08-21 ENCOUNTER — Ambulatory Visit (INDEPENDENT_AMBULATORY_CARE_PROVIDER_SITE_OTHER): Payer: Medicare HMO | Admitting: Medical

## 2018-08-21 VITALS — BP 120/76 | HR 78 | Temp 98.1°F | Resp 16 | Ht 63.5 in | Wt 192.8 lb

## 2018-08-21 DIAGNOSIS — R300 Dysuria: Secondary | ICD-10-CM

## 2018-08-21 DIAGNOSIS — N390 Urinary tract infection, site not specified: Secondary | ICD-10-CM | POA: Diagnosis not present

## 2018-08-21 LAB — POCT URINALYSIS DIP (PROADVANTAGE DEVICE)
Bilirubin, UA: NEGATIVE
Glucose, UA: NEGATIVE mg/dL
Ketones, POC UA: NEGATIVE mg/dL
Nitrite, UA: NEGATIVE
PH UA: 6 (ref 5.0–8.0)
SPECIFIC GRAVITY, URINE: 1.02
UUROB: NEGATIVE

## 2018-08-21 MED ORDER — SULFAMETHOXAZOLE-TRIMETHOPRIM 800-160 MG PO TABS: 1.0000 | ORAL_TABLET | Freq: Two times a day (BID) | ORAL | 0 refills | Status: DC

## 2018-08-21 NOTE — Progress Notes (Signed)
Subjective:   Cristina Sawyer is a 77 y.o. female who complains of possible urinary tract infection.  They report symptoms for 4 days.  Symptoms include burning with urination, cloudy urine, urinary frequency, some urgency.  Patient denies NVD, fever, abdominal pain.  Last UTI was unsure, none recently.   Using water for current symptoms.    Patient does not have a history of recurrent UTI. Patient does not have a history of pyelonephritis.    No other aggravating or relieving factors.  No other c/o.  Past Medical History:  Diagnosis Date  . Arthritis   . Cataract   . Diverticulosis   . Dyslipidemia   . GERD (gastroesophageal reflux disease)   . HTN (hypertension) 2015  . Personal history of colonic adenoma 08/09/2008  . Vitamin D deficiency     Current Outpatient Medications on File Prior to Visit  Medication Sig Dispense Refill  . amLODipine (NORVASC) 5 MG tablet Take 1 tablet (5 mg total) by mouth daily. 90 tablet 3  . aspirin EC 81 MG tablet Take 81 mg by mouth daily.    . cetirizine (ZYRTEC) 10 MG tablet Take 10 mg by mouth daily as needed for allergies.     . rosuvastatin (CRESTOR) 5 MG tablet Take 1 tablet (5 mg total) by mouth daily. 90 tablet 3  . [DISCONTINUED] omeprazole (PRILOSEC OTC) 20 MG tablet Take 20 mg by mouth daily.       No current facility-administered medications on file prior to visit.     ROS as in subjective  Reviewed allergies, medications, past medical, surgical, and social history.      Objective: BP 120/76   Pulse 78   Temp 98.1 F (36.7 C) (Oral)   Resp 16   Ht 5' 3.5" (1.613 m)   Wt 192 lb 12.8 oz (87.5 kg)   SpO2 99%   BMI 33.62 kg/m    General appearance: alert, no distress, WD/WN, female Abdomen: +bs, soft, non tender, non distended, no masses, no hepatomegaly, no splenomegaly, no bruits GU: deferred     Assessment: Encounter Diagnoses  Name Primary?  . Dysuria Yes  . Urinary tract infection without hematuria, site  unspecified      Plan: Discussed symptoms, diagnosis or urinary tract infection, possible complications, and usual course of illness.  Begin medication Bactrim.  Advised increased water intake, can use OTC  Tylenol for pain prn.   Urine culture sent: Yes .    Call or return if worse or not improving in the next 3-4 days.     Cristina Sawyer was seen today for urine issue.  Diagnoses and all orders for this visit:  Dysuria -     POCT Urinalysis DIP (Proadvantage Device) -     Urine Culture  Urinary tract infection without hematuria, site unspecified  Other orders -     sulfamethoxazole-trimethoprim (BACTRIM DS,SEPTRA DS) 800-160 MG tablet; Take 1 tablet by mouth 2 (two) times daily.

## 2018-08-23 LAB — URINE CULTURE

## 2018-08-24 ENCOUNTER — Encounter: Payer: Self-pay | Admitting: Family Medicine

## 2018-08-24 ENCOUNTER — Ambulatory Visit (INDEPENDENT_AMBULATORY_CARE_PROVIDER_SITE_OTHER): Payer: Medicare HMO | Admitting: Family Medicine

## 2018-08-24 VITALS — BP 134/78 | HR 85 | Temp 98.0°F | Wt 190.4 lb

## 2018-08-24 DIAGNOSIS — T3695XA Adverse effect of unspecified systemic antibiotic, initial encounter: Secondary | ICD-10-CM | POA: Diagnosis not present

## 2018-08-24 NOTE — Progress Notes (Signed)
   Subjective:    Patient ID: Cristina Sawyer, female    DOB: [birth year], 77 y.o.   MRN: [id number]  HPI She was seen several days ago and treated for urinary tract infection with Septra.  The day after she started the medication she developed myalgias, abdominal bloating, fatigue and right-sided low back pain.  Her last dose of medication was last night and she does feel better today.   Review of Systems     Objective:   Physical Exam Alert and in no distress.  Urine microscopic was negative.       Assessment & Plan:  Adverse reaction to antibiotic, initial encounter Since her urine is now clear, no further intervention necessary.  She was comfortable with that.

## 2018-11-02 ENCOUNTER — Encounter: Payer: Self-pay | Admitting: Family Medicine

## 2018-11-02 ENCOUNTER — Ambulatory Visit (INDEPENDENT_AMBULATORY_CARE_PROVIDER_SITE_OTHER): Payer: Medicare HMO | Admitting: Family Medicine

## 2018-11-02 ENCOUNTER — Ambulatory Visit
Admission: RE | Admit: 2018-11-02 | Discharge: 2018-11-02 | Disposition: A | Discharge status: Home / Self Care | Payer: Medicare HMO | Source: Ambulatory Visit | Attending: Family Medicine | Admitting: Family Medicine

## 2018-11-02 VITALS — BP 146/80 | HR 80 | Temp 98.7°F | Resp 16 | Ht 64.0 in | Wt 194.8 lb

## 2018-11-02 DIAGNOSIS — M79671 Pain in right foot: Secondary | ICD-10-CM | POA: Diagnosis not present

## 2018-11-02 DIAGNOSIS — M19071 Primary osteoarthritis, right ankle and foot: Secondary | ICD-10-CM | POA: Diagnosis not present

## 2018-11-02 NOTE — Progress Notes (Signed)
   Subjective:    Patient ID: Cristina Sawyer, female    DOB: 01/24/1941, 78 y.o.   MRN: [id number]  HPI She states that I cannot tell on the lateral aspect of the right midfoot causing some swelling and discoloration 2 weeks ago.  She still complains of pain in that area.   Review of Systems     Objective:   Physical Exam Exam of the right foot shows no swelling or discomfort.  There is slight tenderness in the forefoot area near the third and fourth metatarsals.      Assessment & Plan:  Right foot pain - Plan: DG Foot Complete Right I explained that I thought this was mainly soft tissue but will get an x-ray to make sure.

## 2019-02-03 ENCOUNTER — Encounter: Payer: Self-pay | Admitting: Internal Medicine

## 2019-03-18 DIAGNOSIS — H5213 Myopia, bilateral: Secondary | ICD-10-CM | POA: Diagnosis not present

## 2019-03-18 DIAGNOSIS — H524 Presbyopia: Secondary | ICD-10-CM | POA: Diagnosis not present

## 2019-03-18 DIAGNOSIS — H52203 Unspecified astigmatism, bilateral: Secondary | ICD-10-CM | POA: Diagnosis not present

## 2019-03-18 DIAGNOSIS — H25813 Combined forms of age-related cataract, bilateral: Secondary | ICD-10-CM | POA: Diagnosis not present

## 2019-05-29 ENCOUNTER — Other Ambulatory Visit: Payer: Self-pay | Admitting: Family Medicine

## 2019-05-29 DIAGNOSIS — I10 Essential (primary) hypertension: Secondary | ICD-10-CM

## 2019-06-15 ENCOUNTER — Other Ambulatory Visit: Payer: Self-pay

## 2019-06-15 ENCOUNTER — Other Ambulatory Visit (INDEPENDENT_AMBULATORY_CARE_PROVIDER_SITE_OTHER): Payer: Medicare HMO

## 2019-06-15 DIAGNOSIS — Z23 Encounter for immunization: Secondary | ICD-10-CM

## 2019-07-26 ENCOUNTER — Encounter: Payer: Self-pay | Admitting: Family Medicine

## 2019-07-26 ENCOUNTER — Other Ambulatory Visit: Payer: Self-pay

## 2019-07-26 ENCOUNTER — Ambulatory Visit (INDEPENDENT_AMBULATORY_CARE_PROVIDER_SITE_OTHER): Payer: Medicare HMO | Admitting: Family Medicine

## 2019-07-26 VITALS — BP 154/78 | HR 98 | Temp 97.8°F | Ht 63.0 in | Wt 189.6 lb

## 2019-07-26 DIAGNOSIS — K219 Gastro-esophageal reflux disease without esophagitis: Secondary | ICD-10-CM | POA: Diagnosis not present

## 2019-07-26 DIAGNOSIS — Z0000 Encounter for general adult medical examination without abnormal findings: Secondary | ICD-10-CM | POA: Diagnosis not present

## 2019-07-26 DIAGNOSIS — F419 Anxiety disorder, unspecified: Secondary | ICD-10-CM

## 2019-07-26 DIAGNOSIS — Z86010 Personal history of colonic polyps: Secondary | ICD-10-CM | POA: Diagnosis not present

## 2019-07-26 DIAGNOSIS — N1831 Chronic kidney disease, stage 3a: Secondary | ICD-10-CM | POA: Diagnosis not present

## 2019-07-26 DIAGNOSIS — Z8639 Personal history of other endocrine, nutritional and metabolic disease: Secondary | ICD-10-CM

## 2019-07-26 DIAGNOSIS — J301 Allergic rhinitis due to pollen: Secondary | ICD-10-CM

## 2019-07-26 DIAGNOSIS — I10 Essential (primary) hypertension: Secondary | ICD-10-CM | POA: Diagnosis not present

## 2019-07-26 DIAGNOSIS — E785 Hyperlipidemia, unspecified: Secondary | ICD-10-CM

## 2019-07-26 DIAGNOSIS — Z8673 Personal history of transient ischemic attack (TIA), and cerebral infarction without residual deficits: Secondary | ICD-10-CM | POA: Diagnosis not present

## 2019-07-26 DIAGNOSIS — N183 Chronic kidney disease, stage 3 unspecified: Secondary | ICD-10-CM | POA: Diagnosis not present

## 2019-07-26 DIAGNOSIS — R319 Hematuria, unspecified: Secondary | ICD-10-CM

## 2019-07-26 DIAGNOSIS — R69 Illness, unspecified: Secondary | ICD-10-CM | POA: Diagnosis not present

## 2019-07-26 LAB — POCT URINALYSIS DIP (PROADVANTAGE DEVICE)
Glucose, UA: NEGATIVE mg/dL
Leukocytes, UA: NEGATIVE
Nitrite, UA: NEGATIVE
Specific Gravity, Urine: 1.025
Urobilinogen, Ur: 0.2
pH, UA: 6 (ref 5.0–8.0)

## 2019-07-26 MED ORDER — SULFAMETHOXAZOLE-TRIMETHOPRIM 800-160 MG PO TABS: 1.0000 | ORAL_TABLET | Freq: Two times a day (BID) | ORAL | 0 refills | Status: DC

## 2019-07-26 MED ORDER — ALPRAZOLAM 0.25 MG PO TABS: 0.2500 mg | ORAL_TABLET | Freq: Two times a day (BID) | ORAL | 0 refills | Status: DC | PRN

## 2019-07-26 MED ORDER — AMLODIPINE BESYLATE 10 MG PO TABS: 10.0000 mg | ORAL_TABLET | Freq: Every day | ORAL | 3 refills | Status: DC

## 2019-07-26 MED ORDER — ROSUVASTATIN CALCIUM 5 MG PO TABS: 5.0000 mg | ORAL_TABLET | Freq: Every day | ORAL | 3 refills | Status: DC

## 2019-07-26 NOTE — Progress Notes (Signed)
Cristina Sawyer is a 78 y.o. female who presents for annual wellness visit,CPE and follow-up on chronic medical conditions.  She has had difficulty recently dealing with anxiety from social isolation secondary to Covid.  This has interfered with her sleep.  Is also interfered with her personal relationships.  She also would like to get follow-up on her adenomatous colonic polyp.  She will call Dr. Carlean Purl to get that set up.  She does have difficulty with reflux and does use Xanax with some benefit on that.  Review of the record shows no evidence of any CHF, PAD.  Blood work does show stage III CKD.  She does have underlying allergies and is using antihistamines as well as nasal steroid sprays.  Family and social history was reviewed.  Immunizations and Health Maintenance Immunization History  Administered Date(s) Administered  . Fluad Quad(high Dose 65+) 06/15/2019  . Influenza, High Dose Seasonal PF 08/06/2013, 07/04/2015, 07/02/2016, 05/12/2017, 07/20/2018  . Influenza-Unspecified 07/20/2018  . Pneumococcal Conjugate-13 01/17/2014  . Pneumococcal Polysaccharide-23 07/13/2008, 01/09/2016  . Tdap 07/13/2008   Health Maintenance Due  Topic Date Due  . TETANUS/TDAP  07/13/2018  . COLONOSCOPY  03/16/2019    Last Pap smear:N/A Last mammogram:N/A Last colonoscopy:2015 Last DEXA:done Dentist:- Ophtho:- Exercise:walking  Other doctors caring for patient include:None  Advanced directives: No.  Information given.    Depression screen:  See questionnaire below.  Depression screen [facility] 2/9 07/26/2019 05/19/2018 04/01/2017 01/09/2016 01/23/2015  Decreased Interest 0 0 0 0 0  Down, Depressed, Hopeless 2 0 0 0 0  PHQ - 2 Score 2 0 0 0 0  Altered sleeping 3 - - - -  Tired, decreased energy 0 - - - -  Change in appetite 0 - - - -  Feeling bad or failure about yourself  0 - - - -  Trouble concentrating 0 - - - -  Moving slowly or fidgety/restless 3 - - - -  Suicidal thoughts 0 - - - -  PHQ-9  Score 8 - - - -  Difficult doing work/chores Somewhat difficult - - - -    Fall Risk Screen: see questionnaire below. Fall Risk  07/26/2019 05/19/2018 04/01/2017 01/09/2016 01/23/2015  Falls in the past year? 0 No No No No    ADL screen:  See questionnaire below Functional Status Survey: Is the patient deaf or have difficulty hearing?: No Does the patient have difficulty seeing, even when wearing glasses/contacts?: No Does the patient have difficulty concentrating, remembering, or making decisions?: No Does the patient have difficulty walking or climbing stairs?: No Does the patient have difficulty dressing or bathing?: No Does the patient have difficulty doing errands alone such as visiting a doctor's office or shopping?: No   Review of Systems Constitutional: -, -unexpected weight change, -anorexia, -fatigue Allergy: -sneezing, -itching, -congestion Dermatology: denies changing moles, rash, lumps ENT: -runny nose, -ear pain, -sore throat,  Cardiology:  -chest pain, -palpitations, -orthopnea, Respiratory: -cough, -shortness of breath, -dyspnea on exertion, -wheezing,  Gastroenterology: -abdominal pain, -nausea, -vomiting, -diarrhea, -constipation, -dysphagia Hematology: -bleeding or bruising problems Musculoskeletal: -arthralgias, -myalgias, -joint swelling, -back pain, - Ophthalmology: -vision changes,  Urology: -dysuria, -difficulty urinating,  -urinary frequency, -urgency, incontinence Neurology: -, -numbness, , -memory loss, -falls, -dizziness    PHYSICAL EXAM:  BP (!) 154/78 (BP Location: Left Arm, Patient Position: Sitting)   Pulse 98   Temp 97.8 F (36.6 C)   Ht 5\' 3"  (1.6 m)   Wt 189 lb 9.6 oz (86 kg)  SpO2 90%   BMI 33.59 kg/m   General Appearance: Alert, cooperative, no distress, appears stated age Head: Normocephalic, without obvious abnormality, atraumatic Eyes: PERRL, conjunctiva/corneas clear, EOM's intact, fundi benign Ears: Normal TM's and external ear  canals Nose: Nares normal, mucosa normal, no drainage or sinus tenderness Throat: Lips, mucosa, and tongue normal; teeth and gums normal Neck: Supple, no lymphadenopathy;  thyroid:  no enlargement/tenderness/nodules; no carotid bruit or JVD Lungs: Clear to auscultation bilaterally without wheezes, rales or ronchi; respirations unlabored Heart: Regular rate and rhythm, S1 and S2 normal, no murmur, rubor gallop Abdomen: Soft, non-tender, nondistended, normoactive bowel sounds,  no masses, no hepatosplenomegaly Extremities: No clubbing, cyanosis or edema  Skin:  Skin color, texture, turgor normal, no rashes or lesions Lymph nodes: Cervical, supraclavicular, and axillary nodes normal Neurologic:  CNII-XII intact, normal strength, sensation and gait; reflexes 2+ and symmetric throughout Psych: Normal mood, flat affect, hygiene and grooming good. Urine microscopic did show red blood cells. ASSESSMENT/PLAN: Routine general medical examination at a health care facility - Plan: CBC with Differential, Comprehensive metabolic panel, Lipid panel, POCT Urinalysis DIP (Proadvantage Device)  History of TIA (transient ischemic attack) - Plan: rosuvastatin (CRESTOR) 5 MG tablet  Essential hypertension - Plan: CBC with Differential, Comprehensive metabolic panel, amLODipine (NORVASC) 10 MG tablet: Blood pressure is not at goal.  I will therefore increase her Norvasc. Personal history of colonic adenoma: She plans to call Dr. Carlean Purl to set up for another colonoscopy.  Did discuss her age however she would like to go ahead and have the test done. Gastroesophageal reflux disease without esophagitis: Continue with double dose Zantac regularly until her symptoms are under control or possibly using Prilosec.  Stage 3a chronic kidney disease  Hyperlipidemia, unspecified hyperlipidemia type - Plan: Lipid panel  Anxiety - Plan: ALPRAZolam (XANAX) 0.25 MG tablet: I explained that it is not unusual to have  difficulty with anxiety especially during this very turbulent time.  History of vitamin D deficiency - Plan: Vitamin D 25 hydroxy  Hematuria, unspecified type - Plan: sulfamethoxazole-trimethoprim (BACTRIM DS) 800-160 MG tablet: I will treat her as if she has a bladder infection have return here in 2 weeks for recheck on that.  Seasonal allergic rhinitis due to pollen: She is to use an OTC antihistamine as well as Rhinocort to see if it will cut down on her nasal bleeding.      Medicare Attestation I have personally reviewed: The patient's medical and social history Their use of alcohol, tobacco or illicit drugs Their current medications and supplements The patient's functional ability including ADLs,fall risks, home safety risks, cognitive, and hearing and visual impairment Diet and physical activities Evidence for depression or mood disorders  The patient's weight, height, and BMI have been recorded in the chart.  I have made referrals, counseling, and provided education to the patient based on review of the above and I have provided the patient with a written personalized care plan for preventive services.

## 2019-07-27 LAB — CBC WITH DIFFERENTIAL/PLATELET
Basophils Absolute: 0.1 10*3/uL (ref 0.0–0.2)
Basos: 1 %
EOS (ABSOLUTE): 0.2 10*3/uL (ref 0.0–0.4)
Eos: 2 %
Hematocrit: 46 % (ref 34.0–46.6)
Hemoglobin: 15.3 g/dL (ref 11.1–15.9)
Immature Grans (Abs): 0 10*3/uL (ref 0.0–0.1)
Immature Granulocytes: 0 %
Lymphocytes Absolute: 2.6 10*3/uL (ref 0.7–3.1)
Lymphs: 25 %
MCH: 28 pg (ref 26.6–33.0)
MCHC: 33.3 g/dL (ref 31.5–35.7)
MCV: 84 fL (ref 79–97)
Monocytes Absolute: 0.7 10*3/uL (ref 0.1–0.9)
Monocytes: 6 %
Neutrophils Absolute: 6.9 10*3/uL (ref 1.4–7.0)
Neutrophils: 66 %
Platelets: 241 10*3/uL (ref 150–450)
RBC: 5.46 x10E6/uL — ABNORMAL HIGH (ref 3.77–5.28)
RDW: 13.4 % (ref 11.7–15.4)
WBC: 10.4 10*3/uL (ref 3.4–10.8)

## 2019-07-27 LAB — LIPID PANEL
Chol/HDL Ratio: 2.2 ratio (ref 0.0–4.4)
Cholesterol, Total: 185 mg/dL (ref 100–199)
HDL: 86 mg/dL (ref >39)
LDL Chol Calc (NIH): 84 mg/dL (ref 0–99)
Triglycerides: 80 mg/dL (ref 0–149)
VLDL Cholesterol Cal: 15 mg/dL (ref 5–40)

## 2019-07-27 LAB — VITAMIN D 25 HYDROXY (VIT D DEFICIENCY, FRACTURES): Vit D, 25-Hydroxy: 15.7 ng/mL — ABNORMAL LOW (ref 30.0–100.0)

## 2019-07-27 LAB — COMPREHENSIVE METABOLIC PANEL
ALT: 15 IU/L (ref 0–32)
AST: 18 IU/L (ref 0–40)
Albumin/Globulin Ratio: 1.9 (ref 1.2–2.2)
Albumin: 4.8 g/dL — ABNORMAL HIGH (ref 3.7–4.7)
Alkaline Phosphatase: 111 IU/L (ref 39–117)
BUN/Creatinine Ratio: 12 (ref 12–28)
BUN: 13 mg/dL (ref 8–27)
Bilirubin Total: 0.7 mg/dL (ref 0.0–1.2)
CO2: 25 mmol/L (ref 20–29)
Calcium: 10.2 mg/dL (ref 8.7–10.3)
Chloride: 104 mmol/L (ref 96–106)
Creatinine, Ser: 1.11 mg/dL — ABNORMAL HIGH (ref 0.57–1.00)
GFR calc Af Amer: 55 mL/min/{1.73_m2} — ABNORMAL LOW (ref >59)
GFR calc non Af Amer: 48 mL/min/{1.73_m2} — ABNORMAL LOW (ref >59)
Globulin, Total: 2.5 g/dL (ref 1.5–4.5)
Glucose: 106 mg/dL — ABNORMAL HIGH (ref 65–99)
Potassium: 4.7 mmol/L (ref 3.5–5.2)
Sodium: 144 mmol/L (ref 134–144)
Total Protein: 7.3 g/dL (ref 6.0–8.5)

## 2019-08-09 ENCOUNTER — Ambulatory Visit (INDEPENDENT_AMBULATORY_CARE_PROVIDER_SITE_OTHER): Payer: Medicare HMO | Admitting: Family Medicine

## 2019-08-09 ENCOUNTER — Other Ambulatory Visit: Payer: Self-pay

## 2019-08-09 ENCOUNTER — Encounter: Payer: Self-pay | Admitting: Family Medicine

## 2019-08-09 ENCOUNTER — Telehealth: Payer: Self-pay | Admitting: Internal Medicine

## 2019-08-09 VITALS — BP 130/74 | HR 64 | Temp 96.9°F | Wt 187.0 lb

## 2019-08-09 DIAGNOSIS — R319 Hematuria, unspecified: Secondary | ICD-10-CM | POA: Diagnosis not present

## 2019-08-09 DIAGNOSIS — I10 Essential (primary) hypertension: Secondary | ICD-10-CM | POA: Diagnosis not present

## 2019-08-09 DIAGNOSIS — E559 Vitamin D deficiency, unspecified: Secondary | ICD-10-CM | POA: Diagnosis not present

## 2019-08-09 LAB — POCT URINALYSIS DIP (PROADVANTAGE DEVICE)
Blood, UA: NEGATIVE
Glucose, UA: NEGATIVE mg/dL
Ketones, POC UA: NEGATIVE mg/dL
Leukocytes, UA: NEGATIVE
Nitrite, UA: NEGATIVE
Protein Ur, POC: NEGATIVE mg/dL
Specific Gravity, Urine: 1.02
Urobilinogen, Ur: 0.2
pH, UA: 6 (ref 5.0–8.0)

## 2019-08-09 MED ORDER — VITAMIN D (ERGOCALCIFEROL) 1.25 MG (50000 UNIT) PO CAPS: 50000.0000 [IU] | ORAL_CAPSULE | ORAL | 0 refills | Status: DC

## 2019-08-09 NOTE — Telephone Encounter (Signed)
Patient notified that she is not due based on the letter that was mailed to her in May of this year.  She is not having any change in bowel habits, rectal bleeding, or other GI concerns.  She understands to come for an office visit if she has any changes in her GI health.

## 2019-08-09 NOTE — Progress Notes (Signed)
   Subjective:    Patient ID: Cristina Sawyer, female    DOB: 09-Nov-1940, 78 y.o.   MRN: [id number]  HPI She is here for a recheck.  She continues on her blood pressure medication and is having no difficulty with that.  She also recently finished an antibiotic for treatment of hematuria.  She also has concerns over vitamin D.   Review of Systems     Objective:   Physical Exam Alert and in no distress.  Vitamin D level is low.  Urine dipstick is negative.  Blood pressure is recorded      Assessment & Plan:  Essential hypertension  Vitamin D deficiency - Plan: Vitamin D, Ergocalciferol, (DRISDOL) 1.25 MG (50000 UT) CAPS capsule, Vitamin D 25 hydroxy  Hematuria, unspecified type Continue on her present blood pressure medication.  I will also call in vitamin D and have her come back here in 2 months for recheck.

## 2019-08-09 NOTE — Patient Instructions (Signed)
Try Rhinocort for your nasal symptoms

## 2019-08-09 NOTE — Addendum Note (Signed)
Addended by: Elyse Jarvis on: 08/09/2019 01:22 PM   Modules accepted: Orders

## 2019-10-01 ENCOUNTER — Other Ambulatory Visit: Payer: Self-pay | Admitting: Family Medicine

## 2019-10-01 DIAGNOSIS — E559 Vitamin D deficiency, unspecified: Secondary | ICD-10-CM

## 2019-10-01 NOTE — Telephone Encounter (Signed)
Pt has an upcoming appt for vitamin d recheck

## 2019-10-11 ENCOUNTER — Other Ambulatory Visit: Payer: Medicare HMO

## 2019-10-11 ENCOUNTER — Other Ambulatory Visit: Payer: Self-pay

## 2019-10-11 DIAGNOSIS — E559 Vitamin D deficiency, unspecified: Secondary | ICD-10-CM

## 2019-10-12 ENCOUNTER — Other Ambulatory Visit: Payer: Medicare HMO

## 2019-10-12 LAB — VITAMIN D 25 HYDROXY (VIT D DEFICIENCY, FRACTURES): Vit D, 25-Hydroxy: 41.6 ng/mL (ref 30.0–100.0)

## 2019-11-01 ENCOUNTER — Ambulatory Visit: Payer: Medicare HMO | Attending: Internal Medicine

## 2019-12-24 ENCOUNTER — Encounter: Payer: Self-pay | Admitting: Family Medicine

## 2019-12-24 ENCOUNTER — Other Ambulatory Visit: Payer: Self-pay

## 2019-12-24 ENCOUNTER — Ambulatory Visit (INDEPENDENT_AMBULATORY_CARE_PROVIDER_SITE_OTHER): Payer: Medicare HMO | Admitting: Family Medicine

## 2019-12-24 VITALS — BP 120/70 | HR 88 | Temp 96.2°F | Wt 186.2 lb

## 2019-12-24 DIAGNOSIS — R21 Rash and other nonspecific skin eruption: Secondary | ICD-10-CM

## 2019-12-24 MED ORDER — CLOTRIMAZOLE-BETAMETHASONE 1-0.05 % EX CREA: 1.0000 "application " | TOPICAL_CREAM | Freq: Two times a day (BID) | CUTANEOUS | 0 refills | Status: DC

## 2019-12-24 NOTE — Progress Notes (Signed)
   Subjective:    Patient ID: Cristina Sawyer, female    DOB: 1940/10/01, 79 y.o.   MRN: [id number]  HPI Chief Complaint  Patient presents with  . spider bite    spider bite- spot on right foot and scalped over x3 weeks. thinks she has another spot on left hip   Here with complaints of a pruritic rash on the top of her left foot x 3 weeks. States initially she had several red dots and then the area grew. She has been trying to avoid wearing shoes that rub the area. No known insect bite.   Denies fever, chills, body aches, arthralgias or myalgias, N/V/D.   Has been using witch hazel and Neosporin without much improvement.   States she has a small circular area on her left hip for the past 2 days that is also somewhat pruritic.   Denies history of diabetes, MRSA or any skin conditions.   Reviewed allergies, medications, past medical, surgical, family, and social history.    Review of Systems Pertinent positives and negatives in the history of present illness.     Objective:   Physical Exam Constitutional:      General: She is not in acute distress.    Appearance: Normal appearance. She is not ill-appearing.  Musculoskeletal:       Legs:     Comments: 1.25 in x 1 in oval shaped area of dry and cracking skin. No surrounding erythema, induration or drainage.   She also has a small circular area of dry and scaling skin on her left lateral hip  Skin:    General: Skin is warm and dry.     Capillary Refill: Capillary refill takes less than 2 seconds.     Findings: Rash present.  Neurological:     Mental Status: She is alert.    BP 120/70   Pulse 88   Temp (!) 96.2 F (35.7 C)   Wt 186 lb 3.2 oz (84.5 kg)   BMI 32.98 kg/m        Assessment & Plan:  Rash of foot - Plan: clotrimazole-betamethasone (LOTRISONE) cream  Discussed that the area does not appear to be infected. No sign of brown recluse causing this or her skin would look much different. No sloughing,  necrosis or drainage present.  Question dermatitis but no known trigger.  Possible ringworm but not classic in appearance. I will treat her with Lotrisone and have her follow up in one week. I also had her take a picture of the area on her phone for future comparison. Dorothea Ogle, PA also examined patient and in agreement with plan.

## 2019-12-31 ENCOUNTER — Ambulatory Visit: Payer: Medicare HMO | Admitting: Family Medicine

## 2019-12-31 ENCOUNTER — Other Ambulatory Visit: Payer: Self-pay

## 2019-12-31 ENCOUNTER — Encounter: Payer: Self-pay | Admitting: Family Medicine

## 2019-12-31 VITALS — BP 120/76 | HR 80 | Temp 97.5°F | Wt 188.2 lb

## 2019-12-31 DIAGNOSIS — R21 Rash and other nonspecific skin eruption: Secondary | ICD-10-CM

## 2019-12-31 NOTE — Progress Notes (Signed)
   Subjective:    Patient ID: Cristina Sawyer, female    DOB: [birth year], 79 y.o.   MRN: [id number]  HPI Chief Complaint  Patient presents with  . follow-up    follow-up rash on foot. looking better   She is here to follow-up on the rash on the top of her foot.  She started using Lotrisone 1 week ago and the rash has improved significantly.  States the small rash on her left hip is not even visible.  No new concerns   Review of Systems Pertinent positives and negatives in the history of present illness.     Objective:   Physical Exam BP 120/76   Pulse 80   Temp (!) 97.5 F (36.4 C)   Wt 188 lb 3.2 oz (85.4 kg)   BMI 33.34 kg/m   A faint circular area on the top of her right foot, significantly healed compared to last week      Assessment & Plan:  Rash of foot  The rash has improved significantly.  Discussed that this seems to be consistent with ringworm. Continue using Lotrisone for the next 1 to 2 weeks.  If it returns, advised her to use the cream again.

## 2020-03-21 DIAGNOSIS — H5213 Myopia, bilateral: Secondary | ICD-10-CM | POA: Diagnosis not present

## 2020-03-21 DIAGNOSIS — H25813 Combined forms of age-related cataract, bilateral: Secondary | ICD-10-CM | POA: Diagnosis not present

## 2020-03-21 DIAGNOSIS — H524 Presbyopia: Secondary | ICD-10-CM | POA: Diagnosis not present

## 2020-03-21 DIAGNOSIS — H52203 Unspecified astigmatism, bilateral: Secondary | ICD-10-CM | POA: Diagnosis not present

## 2020-04-10 ENCOUNTER — Other Ambulatory Visit: Payer: Self-pay

## 2020-04-10 ENCOUNTER — Ambulatory Visit (INDEPENDENT_AMBULATORY_CARE_PROVIDER_SITE_OTHER): Payer: Medicare HMO | Admitting: Family Medicine

## 2020-04-10 VITALS — BP 146/76 | HR 82 | Temp 96.1°F | Wt 189.0 lb

## 2020-04-10 DIAGNOSIS — Z8739 Personal history of other diseases of the musculoskeletal system and connective tissue: Secondary | ICD-10-CM | POA: Diagnosis not present

## 2020-04-10 DIAGNOSIS — M25462 Effusion, left knee: Secondary | ICD-10-CM

## 2020-04-10 DIAGNOSIS — M25461 Effusion, right knee: Secondary | ICD-10-CM

## 2020-04-10 MED ORDER — LIDOCAINE HCL 1 % IJ SOLN
10.0000 mL | Freq: Once | INTRAMUSCULAR | Status: AC
  Administered 2020-04-10: 10 mL via INTRADERMAL

## 2020-04-10 NOTE — Patient Instructions (Signed)
2 Tylenol 4 times per day and also 3 ibuprofen 4 times per day

## 2020-04-10 NOTE — Progress Notes (Addendum)
   Subjective:    Patient ID: Cristina Sawyer, female    DOB: [birth year], 79 y.o.   MRN: [id number]  HPI She complains of a 1 week history of bilateral knee pain and since Saturday she has noted swelling in both knees.  No other joints are involved.  No fever, chills.  She does state that she does occasionally have some hand pain.  She has remote history of being seen by rheumatology however no particular diagnosis was made.  Review of Systems     Objective:   Physical Exam Alert and complaining of bilateral knee pain.  Both knees show an effusion but it is not hot and does cause some pain of motion of the knee.  Anterior drawer and medial as well as lateral collateral ligaments intact.  McMurray's testing was uncomfortable.  Exam of the elbows wrists ankles shows no effusion.       Assessment & Plan:  Bilateral knee effusions - Plan: Synovial fluid, cell count I discussed the findings with her and since this is bilateral explained that that changes the possible diagnoses.  Discussed taking the fluid out of her knee to evaluate it.  She was comfortable with that.  The left knee was prepped along the lateral joint line.  Xylocaine was used to numb the skin up.  20 cc of clear yellow fluid was removed from the left knee without difficulty.  She tolerated the procedure well. At this point I will have her take 2 Tylenol 4 times per day as well as 3 ibuprofen 4 times per day pending results of the fluid evaluation. Calcium pyrophosphate crystals were noted on synovial fluid evaluation.

## 2020-04-10 NOTE — Addendum Note (Signed)
Addended by: Elyse Jarvis on: 04/10/2020 05:03 PM   Modules accepted: Orders

## 2020-04-11 DIAGNOSIS — Z8739 Personal history of other diseases of the musculoskeletal system and connective tissue: Secondary | ICD-10-CM | POA: Insufficient documentation

## 2020-04-12 LAB — SYNOVIAL FLUID, CELL COUNT
Eos, Fluid: 0 %
Lining Cells, Synovial: 0 %
Lymphs, Fluid: 6 %
Macrophages Fld: 6 %
Nuc cell # Fld: 455 cells/uL — ABNORMAL HIGH (ref 0–200)
Polys, Fluid: 88 %

## 2020-05-05 DIAGNOSIS — Z20822 Contact with and (suspected) exposure to covid-19: Secondary | ICD-10-CM | POA: Diagnosis not present

## 2020-05-09 ENCOUNTER — Encounter: Payer: Self-pay | Admitting: Family Medicine

## 2020-05-09 ENCOUNTER — Other Ambulatory Visit: Payer: Self-pay

## 2020-05-09 ENCOUNTER — Telehealth (INDEPENDENT_AMBULATORY_CARE_PROVIDER_SITE_OTHER): Payer: Medicare HMO | Admitting: Family Medicine

## 2020-05-09 VITALS — Temp 99.0°F | Wt 189.0 lb

## 2020-05-09 DIAGNOSIS — L0100 Impetigo, unspecified: Secondary | ICD-10-CM

## 2020-05-09 MED ORDER — DOXYCYCLINE HYCLATE 100 MG PO TABS: 100.0000 mg | ORAL_TABLET | Freq: Two times a day (BID) | ORAL | 0 refills | Status: DC

## 2020-05-09 NOTE — Progress Notes (Signed)
   Subjective:    Patient ID: Cristina Sawyer, female    DOB: Aug 09, 1941, 79 y.o.   MRN: [id number]  HPI She has had difficulty since August 13 with fever, chills, dry cough, headache with myalgias, arthralgias and fatigue.  She did have a Covid test done on Friday which was negative.  She does feel better today but still has concerns over lesions on her legs.  Initially the encounter was virtual but decided to make it an actual visit to be able to look at the lesions.   Review of Systems     Objective:   Physical Exam Alert and in no distress. Tympanic membranes and canals are normal. Pharyngeal area is normal. Neck is supple without adenopathy or thyromegaly. Cardiac exam shows a regular sinus rhythm without murmurs or gallops. Lungs are clear to auscultation. Scattered, less than 6 raised slightly erythematous lesions are noted on her legs, one on her buttock.       Assessment & Plan:  Impetigo - Plan: doxycycline (VIBRA-TABS) 100 MG tablet I think she had a viral illness and now that is going well but the lesions I think are impetiginous in nature and possibly MRSA since they seem to be coming and going.  None of them were open and draining and therefore unable to get a culture.

## 2020-06-06 ENCOUNTER — Other Ambulatory Visit: Payer: Self-pay

## 2020-06-06 ENCOUNTER — Encounter: Payer: Self-pay | Admitting: Family Medicine

## 2020-06-06 ENCOUNTER — Ambulatory Visit (INDEPENDENT_AMBULATORY_CARE_PROVIDER_SITE_OTHER): Payer: Medicare HMO | Admitting: Family Medicine

## 2020-06-06 VITALS — BP 146/80 | HR 91 | Temp 96.8°F | Wt 187.8 lb

## 2020-06-06 DIAGNOSIS — Z23 Encounter for immunization: Secondary | ICD-10-CM | POA: Diagnosis not present

## 2020-06-06 DIAGNOSIS — L309 Dermatitis, unspecified: Secondary | ICD-10-CM | POA: Diagnosis not present

## 2020-06-06 MED ORDER — TRIAMCINOLONE ACETONIDE 0.1 % EX CREA: 1.0000 "application " | TOPICAL_CREAM | Freq: Two times a day (BID) | CUTANEOUS | 0 refills | Status: DC

## 2020-06-06 NOTE — Progress Notes (Signed)
   Subjective:    Patient ID: Cristina Sawyer, female    DOB: 09-29-1940, 79 y.o.   MRN: [id number]  HPI She is here for consult concerning continued difficulty with lesions on her legs.  She was treated with topical antifungal/steroid cream and then recently was given doxycycline, none of which is really been beneficial.  She states that the lesions are uncomfortable to touch.  She also complains of some aches and pains in her joints.   Review of Systems     Objective:   Physical Exam Alert and in no distress.  Four plaque-like brownish lesions that are well demarcated approximately 3 to 4 cm in size are noted on her legs.       Assessment & Plan:  Dermatitis - Plan: triamcinolone cream (KENALOG) 0.1 %, Ambulatory referral to Dermatology  Need for influenza vaccination - Plan: Flu Vaccine QUAD High Dose(Fluad)  Immunization, viral disease - Plan: Pfizer SARS-COV-2 Vaccine The etiology of the skin lesions is unclear but could be plaque psoriasis.  This also might play a role in her arthritic type symptoms.  Refer to dermatology for this. She also is roughly 7 months since having her Pfizer vaccine and I think it is appropriate to go ahead and give her the booster.  She was comfortable with that.

## 2020-07-09 ENCOUNTER — Other Ambulatory Visit: Payer: Self-pay | Admitting: Family Medicine

## 2020-07-09 DIAGNOSIS — Z8673 Personal history of transient ischemic attack (TIA), and cerebral infarction without residual deficits: Secondary | ICD-10-CM

## 2020-07-17 DIAGNOSIS — L300 Nummular dermatitis: Secondary | ICD-10-CM | POA: Diagnosis not present

## 2020-07-17 DIAGNOSIS — L80 Vitiligo: Secondary | ICD-10-CM | POA: Diagnosis not present

## 2020-08-02 ENCOUNTER — Encounter: Payer: Self-pay | Admitting: Family Medicine

## 2020-08-02 ENCOUNTER — Ambulatory Visit (INDEPENDENT_AMBULATORY_CARE_PROVIDER_SITE_OTHER): Payer: Medicare HMO | Admitting: Family Medicine

## 2020-08-02 ENCOUNTER — Other Ambulatory Visit: Payer: Self-pay

## 2020-08-02 VITALS — BP 118/70 | HR 76 | Temp 96.6°F | Ht 63.0 in | Wt 185.4 lb

## 2020-08-02 DIAGNOSIS — Z1159 Encounter for screening for other viral diseases: Secondary | ICD-10-CM

## 2020-08-02 DIAGNOSIS — Z8639 Personal history of other endocrine, nutritional and metabolic disease: Secondary | ICD-10-CM

## 2020-08-02 DIAGNOSIS — Z0000 Encounter for general adult medical examination without abnormal findings: Secondary | ICD-10-CM | POA: Diagnosis not present

## 2020-08-02 DIAGNOSIS — Z1211 Encounter for screening for malignant neoplasm of colon: Secondary | ICD-10-CM

## 2020-08-02 DIAGNOSIS — Z8739 Personal history of other diseases of the musculoskeletal system and connective tissue: Secondary | ICD-10-CM | POA: Diagnosis not present

## 2020-08-02 DIAGNOSIS — Z860100 Personal history of colon polyps, unspecified: Secondary | ICD-10-CM

## 2020-08-02 DIAGNOSIS — N1831 Chronic kidney disease, stage 3a: Secondary | ICD-10-CM | POA: Diagnosis not present

## 2020-08-02 DIAGNOSIS — J301 Allergic rhinitis due to pollen: Secondary | ICD-10-CM | POA: Diagnosis not present

## 2020-08-02 DIAGNOSIS — E785 Hyperlipidemia, unspecified: Secondary | ICD-10-CM

## 2020-08-02 DIAGNOSIS — G459 Transient cerebral ischemic attack, unspecified: Secondary | ICD-10-CM

## 2020-08-02 DIAGNOSIS — K219 Gastro-esophageal reflux disease without esophagitis: Secondary | ICD-10-CM | POA: Diagnosis not present

## 2020-08-02 DIAGNOSIS — I10 Essential (primary) hypertension: Secondary | ICD-10-CM

## 2020-08-02 DIAGNOSIS — H259 Unspecified age-related cataract: Secondary | ICD-10-CM

## 2020-08-02 LAB — LIPID PANEL

## 2020-08-02 NOTE — Progress Notes (Addendum)
Cristina Sawyer is a 79 y.o. female who presents for annual wellness visit,CPE and follow-up on chronic medical conditions.  She has a history of colonic polyps with her last colonoscopy in 2015.  She has concerns over whether follow-up as needed.  She does have eczema and vitiligo and does see her dermatologist for this.  She is on medicines that seem to be working.  She does have reflux disease and tries to monitor this with diet but does occasionally have difficulty with clearing her throat.  She also has a history of CPPD and does have difficulty mainly with knee discomfort and swelling.  She continues on amlodipine and having no difficulty with that.  Her allergies seem to be under good control. She does have a previous history of TIA but has had no weakness, numbness, blurred or double vision.  She has had no difficulty with chest pain, PND, DOE.  Also has evidence of CKD 3.  She is monitored by her ophthalmologist for her cataracts but presently does not need surgery.  Also has a history of vitamin D deficiency.  She continues on Crestor for her lipids. Immunizations and Health Maintenance Immunization History  Administered Date(s) Administered  . Fluad Quad(high Dose 65+) 06/15/2019, 06/06/2020  . Influenza, High Dose Seasonal PF 08/06/2013, 07/04/2015, 07/02/2016, 05/12/2017, 07/20/2018  . Influenza-Unspecified 07/20/2018  . PFIZER SARS-COV-2 Vaccination 10/21/2019, 11/11/2019, 06/06/2020  . Pneumococcal Conjugate-13 01/17/2014  . Pneumococcal Polysaccharide-23 07/13/2008, 01/09/2016  . Tdap 07/13/2008   Health Maintenance Due  Topic Date Due  . Hepatitis C Screening  Never done  . TETANUS/TDAP  07/13/2018  . COLONOSCOPY  03/16/2019    Last Pap smear: aged out  Last mammogram: 02/26/12 Last colonoscopy: 03/15/14 Last DEXA: 07/25/09 Dentist: over two years ago Ophtho: Q year Exercise: 15 mins QOD  Other doctors caring for patient include: Dr. Carlean P[facility], Dr. Richardine Service, Dr. Marin Roberts  Advanced directives: Does Patient Have a Medical Advance Directive?: No Does patient want to make changes to medical advance directive?: Yes (MAU/Ambulatory/Procedural Areas - Information given)  Depression screen:  See questionnaire below.  Depression screen [facility] 2/9 08/02/2020 05/09/2020 07/26/2019 05/19/2018 04/01/2017  Decreased Interest 0 0 0 0 0  Down, Depressed, Hopeless 0 0 2 0 0  PHQ - 2 Score 0 0 2 0 0  Altered sleeping - - 3 - -  Tired, decreased energy - - 0 - -  Change in appetite - - 0 - -  Feeling bad or failure about yourself  - - 0 - -  Trouble concentrating - - 0 - -  Moving slowly or fidgety/restless - - 3 - -  Suicidal thoughts - - 0 - -  PHQ-9 Score - - 8 - -  Difficult doing work/chores - - Somewhat difficult - -    Fall Risk Screen: see questionnaire below. Fall Risk  08/02/2020 07/26/2019 05/19/2018 04/01/2017 01/09/2016  Falls in the past year? 0 0 No No No    ADL screen:  See questionnaire below Functional Status Survey: Is the patient deaf or have difficulty hearing?: No Does the patient have difficulty seeing, even when wearing glasses/contacts?: No Does the patient have difficulty concentrating, remembering, or making decisions?: No Does the patient have difficulty walking or climbing stairs?: Yes (knee problem both) Does the patient have difficulty dressing or bathing?: No Does the patient have difficulty doing errands alone such as visiting a doctor's office or shopping?: No   Review of Systems Constitutional: -, -unexpected weight  change, -anorexia, -fatigue Allergy: -sneezing, -itching, -congestion Dermatology: denies changing moles, rash, lumps ENT: -runny nose, -ear pain, -sore throat,  Cardiology:  -chest pain, -palpitations, -orthopnea, Respiratory: -cough, -shortness of breath, -dyspnea on exertion, -wheezing,  Gastroenterology: -abdominal pain, -nausea, -vomiting, -diarrhea, -constipation, -dysphagia Hematology: -bleeding or bruising  problems Musculoskeletal: -arthralgias, -myalgias, -joint swelling, -back pain, - Ophthalmology: -vision changes,  Urology: -dysuria, -difficulty urinating,  -urinary frequency, -urgency, incontinence Neurology: -, -numbness, , -memory loss, -falls, -dizziness    PHYSICAL EXAM: General Appearance: Alert, cooperative, no distress, appears stated age Head: Normocephalic, without obvious abnormality, atraumatic Eyes: PERRL, conjunctiva/corneas clear, EOM's intact, Ears: Normal TM's and external ear canals Nose: Nares normal, mucosa normal, no drainage or sinus tenderness Throat: Lips, mucosa, and tongue normal; teeth and gums normal Neck: Supple, no lymphadenopathy;  thyroid:  no enlargement/tenderness/nodules; no carotid bruit or JVD Lungs: Clear to auscultation bilaterally without wheezes, rales or ronchi; respirations unlabored Heart: Regular rate and rhythm, S1 and S2 normal, no murmur, rubor gallop Abdomen: Soft, non-tender, nondistended, normoactive bowel sounds,  no masses, no hepatosplenomegaly Extremities: No clubbing, cyanosis or edema Pulses: 2+ and symmetric all extremities Skin:  Skin color, texture, turgor normal, no rashes or lesions Lymph nodes: Cervical, supraclavicular, and axillary nodes normal Neurologic:  CNII-XII intact, normal strength, sensation and gait; reflexes 2+ and symmetric throughout Psych: Normal mood, affect, hygiene and grooming.  ASSESSMENT/PLAN: Routine general medical examination at a health care facility - Plan: CBC with Differential/Platelet, Comprehensive metabolic panel, Lipid panel  Personal history of colonic adenoma  Gastroesophageal reflux disease without esophagitis  Seasonal allergic rhinitis due to pollen  Stage 3a chronic kidney disease ([place]) - Plan: Comprehensive metabolic panel  Essential hypertension - Plan: CBC with Differential/Platelet, Comprehensive metabolic panel  Transient cerebral ischemia, unspecified  type  Age-related cataract of both eyes, unspecified age-related cataract type  Hyperlipidemia, unspecified hyperlipidemia type - Plan: Lipid panel  History of vitamin D deficiency - Plan: VITAMIN D 25 Hydroxy (Vit-D Deficiency, Fractures)  Personal history of calcium pyrophosphate deposition disease (CPPD)  Encounter for hepatitis C screening test for low risk patient - Plan: Hepatitis C antibody  Screening for colon cancer - Plan: Cologuard     Discussed m at least 30 minutes of aerobic activity at least 5 days/week and weight-bearing exercise 2x/week;  and vitamin D intake .  Immunization recommendations discussed.  Colonoscopy recommendations reviewed.  At this point she does not qualify for repeat colonoscopy in spite of her history of polyps.  Discussed the risk of colonoscopy in someone her age and risk of dying from colon cancer.  Explained the use of Cologuard might possibly give some reassurance even no she certainly does not qualify.  She was comfortable with that.  Otherwise she will continue on her present medication regimen including the use of Tylenol and/or NSAID when she has knee pain which is probably CPPD related.  She was comfortable with that.  She will continue be followed by ophthalmology.  Discussed possibly adding an H2 blocker to see if that will help with her clearing her throat which could be reflux related rather than allergies.   Medicare Attestation I have personally reviewed: The patient's medical and social history Their use of alcohol, tobacco or illicit drugs Their current medications and supplements The patient's functional ability including ADLs,fall risks, home safety risks, cognitive, and hearing and visual impairment Diet and physical activities Evidence for depression or mood disorders  The patient's weight, height, and BMI have been recorded in the chart.  I have made referrals, counseling, and provided education to the patient based on review of  the above and I have provided the patient with a written personalized care plan for preventive services.     Jill Alexanders, MD   08/02/2020

## 2020-08-02 NOTE — Patient Instructions (Signed)
  Cristina Sawyer , Thank you for taking time to come for your Medicare Wellness Visit. I appreciate your ongoing commitment to your health goals. Please review the following plan we discussed and let me know if I can assist you in the future.   These are the goals we discussed: Goals   None     This is a list of the screening recommended for you and due dates:  Health Maintenance  Topic Date Due  .  Hepatitis C: One time screening is recommended by Center for Disease Control  (CDC) for  adults born from [birth year] through 1965.   Never done  . Tetanus Vaccine  07/13/2018  . Colon Cancer Screening  03/16/2019  . Flu Shot  Completed  . DEXA scan (bone density measurement)  Completed  . COVID-19 Vaccine  Completed  . Pneumonia vaccines  Completed

## 2020-08-03 LAB — CBC WITH DIFFERENTIAL/PLATELET
Basophils Absolute: 0.1 10*3/uL (ref 0.0–0.2)
Basos: 1 %
EOS (ABSOLUTE): 0.2 10*3/uL (ref 0.0–0.4)
Eos: 2 %
Hematocrit: 45.3 % (ref 34.0–46.6)
Hemoglobin: 14.9 g/dL (ref 11.1–15.9)
Immature Grans (Abs): 0 10*3/uL (ref 0.0–0.1)
Immature Granulocytes: 0 %
Lymphocytes Absolute: 2.9 10*3/uL (ref 0.7–3.1)
Lymphs: 29 %
MCH: 28.2 pg (ref 26.6–33.0)
MCHC: 32.9 g/dL (ref 31.5–35.7)
MCV: 86 fL (ref 79–97)
Monocytes Absolute: 0.7 10*3/uL (ref 0.1–0.9)
Monocytes: 7 %
Neutrophils Absolute: 5.9 10*3/uL (ref 1.4–7.0)
Neutrophils: 61 %
Platelets: 244 10*3/uL (ref 150–450)
RBC: 5.28 x10E6/uL (ref 3.77–5.28)
RDW: 13.8 % (ref 11.7–15.4)
WBC: 9.7 10*3/uL (ref 3.4–10.8)

## 2020-08-03 LAB — COMPREHENSIVE METABOLIC PANEL
ALT: 14 IU/L (ref 0–32)
AST: 18 IU/L (ref 0–40)
Albumin/Globulin Ratio: 1.8 (ref 1.2–2.2)
Albumin: 4.8 g/dL — ABNORMAL HIGH (ref 3.7–4.7)
Alkaline Phosphatase: 104 IU/L (ref 44–121)
BUN/Creatinine Ratio: 17 (ref 12–28)
BUN: 18 mg/dL (ref 8–27)
Bilirubin Total: 0.8 mg/dL (ref 0.0–1.2)
CO2: 22 mmol/L (ref 20–29)
Calcium: 10.3 mg/dL (ref 8.7–10.3)
Chloride: 103 mmol/L (ref 96–106)
Creatinine, Ser: 1.08 mg/dL — ABNORMAL HIGH (ref 0.57–1.00)
GFR calc Af Amer: 56 mL/min/{1.73_m2} — ABNORMAL LOW (ref >59)
GFR calc non Af Amer: 49 mL/min/{1.73_m2} — ABNORMAL LOW (ref >59)
Globulin, Total: 2.7 g/dL (ref 1.5–4.5)
Glucose: 99 mg/dL (ref 65–99)
Potassium: 4.3 mmol/L (ref 3.5–5.2)
Sodium: 144 mmol/L (ref 134–144)
Total Protein: 7.5 g/dL (ref 6.0–8.5)

## 2020-08-03 LAB — LIPID PANEL
Chol/HDL Ratio: 2.2 ratio (ref 0.0–4.4)
Cholesterol, Total: 186 mg/dL (ref 100–199)
HDL: 85 mg/dL (ref >39)
LDL Chol Calc (NIH): 88 mg/dL (ref 0–99)
Triglycerides: 70 mg/dL (ref 0–149)
VLDL Cholesterol Cal: 13 mg/dL (ref 5–40)

## 2020-08-03 LAB — VITAMIN D 25 HYDROXY (VIT D DEFICIENCY, FRACTURES): Vit D, 25-Hydroxy: 37.2 ng/mL (ref 30.0–100.0)

## 2020-08-03 LAB — HEPATITIS C ANTIBODY: Hep C Virus Ab: <0.1 s/co ratio (ref 0.0–0.9)

## 2020-08-18 ENCOUNTER — Other Ambulatory Visit: Payer: Self-pay | Admitting: Family Medicine

## 2020-08-18 DIAGNOSIS — I10 Essential (primary) hypertension: Secondary | ICD-10-CM

## 2020-08-21 DIAGNOSIS — Z1211 Encounter for screening for malignant neoplasm of colon: Secondary | ICD-10-CM | POA: Diagnosis not present

## 2020-08-21 LAB — COLOGUARD: Cologuard: NEGATIVE

## 2020-08-29 LAB — EXTERNAL GENERIC LAB PROCEDURE: COLOGUARD: NEGATIVE

## 2020-08-29 LAB — COLOGUARD: COLOGUARD: NEGATIVE

## 2020-09-01 NOTE — Progress Notes (Signed)
lvm advise pt of negative cologuard. [place]

## 2020-09-11 DIAGNOSIS — Z20822 Contact with and (suspected) exposure to covid-19: Secondary | ICD-10-CM | POA: Diagnosis not present

## 2020-09-13 ENCOUNTER — Encounter: Payer: Self-pay | Admitting: Family Medicine

## 2020-09-13 ENCOUNTER — Telehealth (INDEPENDENT_AMBULATORY_CARE_PROVIDER_SITE_OTHER): Payer: Medicare HMO | Admitting: Family Medicine

## 2020-09-13 VITALS — Wt 179.0 lb

## 2020-09-13 DIAGNOSIS — J209 Acute bronchitis, unspecified: Secondary | ICD-10-CM

## 2020-09-13 MED ORDER — AZITHROMYCIN 500 MG PO TABS: 500.0000 mg | ORAL_TABLET | Freq: Every day | ORAL | 0 refills | Status: DC

## 2020-09-13 NOTE — Progress Notes (Signed)
   Subjective:    Patient ID: Cristina Sawyer, female    DOB: 08/24/1941, 79 y.o.   MRN: [id number]  HPI I connected with  MAELY CLEMENTS on 09/13/20 by a video enabled telemedicine application and verified that I am speaking with the correct person using two identifiers.  Caregility used.  The: Office.  Patient: Home. I discussed the limitations of evaluation and management by telemedicine. The patient expressed understanding and agreed to proceed. She has a 2-week history of difficulty with malaise, slight nausea, headache, sore throat, weakness with dry cough and slight chills.  No fever, smell or taste change.  She recently had a Covid test which was negative.  Review of Systems     Objective:   Physical Exam Alert and in no distress.  She has normal voice pattern and no evidence of tachypnea.       Assessment & Plan:  Acute bronchitis, unspecified organism - Plan: azithromycin (ZITHROMAX) 500 MG tablet Symptoms are consistent with bronchitis.  I will treat with azithromycin.  Explained that the medicine can work for at least 7 days and if any question she should call me at that timeframe.  She was comfortable with that. 20 minutes spent today reviewing medical record history documentation and coordination of care.

## 2021-03-05 ENCOUNTER — Telehealth (INDEPENDENT_AMBULATORY_CARE_PROVIDER_SITE_OTHER): Payer: Medicare HMO | Admitting: Family Medicine

## 2021-03-05 ENCOUNTER — Encounter: Payer: Self-pay | Admitting: Family Medicine

## 2021-03-05 ENCOUNTER — Other Ambulatory Visit: Payer: Self-pay

## 2021-03-05 VITALS — Ht 63.0 in | Wt 179.0 lb

## 2021-03-05 DIAGNOSIS — J0100 Acute maxillary sinusitis, unspecified: Secondary | ICD-10-CM | POA: Diagnosis not present

## 2021-03-05 MED ORDER — AMOXICILLIN-POT CLAVULANATE 875-125 MG PO TABS: 1.0000 | ORAL_TABLET | Freq: Two times a day (BID) | ORAL | 0 refills | Status: DC

## 2021-03-05 NOTE — Progress Notes (Signed)
   Subjective:    Patient ID: Cristina Sawyer, female    DOB: 10-01-1940, 80 y.o.   MRN: [id number]  HPI Documentation for virtual audio and video telecommunications through [place] encounter: The patient was located at home. 2 patient identifiers used.  The provider was located in the office. The patient did consent to this visit and is aware of possible charges through their insurance for this visit. The other persons participating in this telemedicine service were none. Time spent on call was 5 minutes and in review of previous records >20 minutes total for counseling and coordination of care. This virtual service is not related to other E/M service within previous 7 days.  She has a 4-week history of difficulty that started with sneezing, slight cough, headache, postnasal drainage that gotten worse.  Postnasal drainage is continued as well as slight sore throat and upper tooth discomfort.  She did do a recent COVID test which was negative.  Review of Systems     Objective:   Physical Exam Alert and in no distress with a normal voice pattern.  Otherwise not examined       Assessment & Plan:  Acute non-recurrent maxillary sinusitis - Plan: amoxicillin-clavulanate (AUGMENTIN) 875-125 MG tablet Symptoms are most consistent with a sinus infection.  I will place her on Augmentin and if she is not totally back to normal when she finishes, she is to call me.

## 2021-03-21 DIAGNOSIS — H5213 Myopia, bilateral: Secondary | ICD-10-CM | POA: Diagnosis not present

## 2021-03-21 DIAGNOSIS — H524 Presbyopia: Secondary | ICD-10-CM | POA: Diagnosis not present

## 2021-03-21 DIAGNOSIS — H25013 Cortical age-related cataract, bilateral: Secondary | ICD-10-CM | POA: Diagnosis not present

## 2021-03-21 DIAGNOSIS — H2513 Age-related nuclear cataract, bilateral: Secondary | ICD-10-CM | POA: Diagnosis not present

## 2021-03-21 DIAGNOSIS — H52203 Unspecified astigmatism, bilateral: Secondary | ICD-10-CM | POA: Diagnosis not present

## 2021-04-03 ENCOUNTER — Ambulatory Visit (INDEPENDENT_AMBULATORY_CARE_PROVIDER_SITE_OTHER): Payer: Medicare HMO | Admitting: Family Medicine

## 2021-04-03 ENCOUNTER — Other Ambulatory Visit: Payer: Self-pay

## 2021-04-03 ENCOUNTER — Encounter: Payer: Self-pay | Admitting: Family Medicine

## 2021-04-03 VITALS — BP 136/78 | HR 102 | Temp 97.9°F | Wt 185.0 lb

## 2021-04-03 DIAGNOSIS — M542 Cervicalgia: Secondary | ICD-10-CM | POA: Diagnosis not present

## 2021-04-03 NOTE — Patient Instructions (Signed)
Heat for 20 minutes 3 times per day.  Gentle stretching after that.  They will make everything looser kind you can take Tylenol as many as 2 pills 4 times per day for pain and if you need more you can take either Advil or Aleve with.  You can also see the chiropractor

## 2021-04-03 NOTE — Progress Notes (Signed)
   Subjective:    Patient ID: Cristina Sawyer, female    DOB: 04/20/1941, 80 y.o.   MRN: [id number]  HPI She is here for evaluation of a 1 month history of mid neck and upper back discomfort that is intermittent in nature.  She thinks she did do some heavier than normal lifting prior to starting of this.  No weakness, numbness or tingling She has been intermittently using ibuprofen 800 mg with some relief.  Review of Systems     Objective:   Physical Exam Full motion of the neck with some discomfort.  No palpable tenderness to the cervical spine or paravertebral muscles.  No tenderness on the upper back muscles.  Normal motor, sensory and DTRs.       Assessment & Plan:  Neck pain Heat for 20 minutes 3 times per day.  Gentle stretching after that.  They will make everything looser kind you can take Tylenol as many as 2 pills 4 times per day for pain and if you need more you can take either Advil or Aleve with.  You can also see the chiropractor

## 2021-07-04 ENCOUNTER — Encounter (HOSPITAL_BASED_OUTPATIENT_CLINIC_OR_DEPARTMENT_OTHER): Payer: Self-pay | Admitting: *Deleted

## 2021-07-04 ENCOUNTER — Emergency Department (HOSPITAL_BASED_OUTPATIENT_CLINIC_OR_DEPARTMENT_OTHER): Payer: Medicare HMO

## 2021-07-04 ENCOUNTER — Other Ambulatory Visit: Payer: Self-pay

## 2021-07-04 ENCOUNTER — Observation Stay (HOSPITAL_BASED_OUTPATIENT_CLINIC_OR_DEPARTMENT_OTHER)
Admission: EM | Admit: 2021-07-04 | Discharge: 2021-07-06 | Disposition: A | Discharge status: Home / Self Care | Payer: Medicare HMO | Attending: Internal Medicine | Admitting: Internal Medicine

## 2021-07-04 DIAGNOSIS — R1111 Vomiting without nausea: Secondary | ICD-10-CM | POA: Diagnosis not present

## 2021-07-04 DIAGNOSIS — K922 Gastrointestinal hemorrhage, unspecified: Principal | ICD-10-CM | POA: Insufficient documentation

## 2021-07-04 DIAGNOSIS — I10 Essential (primary) hypertension: Secondary | ICD-10-CM | POA: Diagnosis not present

## 2021-07-04 DIAGNOSIS — R1084 Generalized abdominal pain: Secondary | ICD-10-CM | POA: Diagnosis not present

## 2021-07-04 DIAGNOSIS — Z79899 Other long term (current) drug therapy: Secondary | ICD-10-CM | POA: Insufficient documentation

## 2021-07-04 DIAGNOSIS — Z7982 Long term (current) use of aspirin: Secondary | ICD-10-CM | POA: Diagnosis not present

## 2021-07-04 DIAGNOSIS — D72829 Elevated white blood cell count, unspecified: Secondary | ICD-10-CM | POA: Insufficient documentation

## 2021-07-04 DIAGNOSIS — R69 Illness, unspecified: Secondary | ICD-10-CM | POA: Diagnosis not present

## 2021-07-04 DIAGNOSIS — Z20822 Contact with and (suspected) exposure to covid-19: Secondary | ICD-10-CM | POA: Diagnosis not present

## 2021-07-04 DIAGNOSIS — F411 Generalized anxiety disorder: Secondary | ICD-10-CM | POA: Diagnosis present

## 2021-07-04 DIAGNOSIS — R1030 Lower abdominal pain, unspecified: Secondary | ICD-10-CM | POA: Diagnosis not present

## 2021-07-04 DIAGNOSIS — R110 Nausea: Secondary | ICD-10-CM | POA: Diagnosis not present

## 2021-07-04 DIAGNOSIS — R109 Unspecified abdominal pain: Secondary | ICD-10-CM | POA: Diagnosis present

## 2021-07-04 DIAGNOSIS — E785 Hyperlipidemia, unspecified: Secondary | ICD-10-CM | POA: Diagnosis not present

## 2021-07-04 LAB — URINALYSIS, ROUTINE W REFLEX MICROSCOPIC
Bilirubin Urine: NEGATIVE
Glucose, UA: NEGATIVE mg/dL
Ketones, ur: 15 mg/dL — AB
Leukocytes,Ua: NEGATIVE
Nitrite: NEGATIVE
Protein, ur: 30 mg/dL — AB
Specific Gravity, Urine: 1.018 (ref 1.005–1.030)
pH: 6 (ref 5.0–8.0)

## 2021-07-04 LAB — COMPREHENSIVE METABOLIC PANEL
ALT: 10 U/L (ref 0–44)
AST: 21 U/L (ref 15–41)
Albumin: 4.6 g/dL (ref 3.5–5.0)
Alkaline Phosphatase: 82 U/L (ref 38–126)
Anion gap: 13 (ref 5–15)
BUN: 12 mg/dL (ref 8–23)
CO2: 19 mmol/L — ABNORMAL LOW (ref 22–32)
Calcium: 9.9 mg/dL (ref 8.9–10.3)
Chloride: 105 mmol/L (ref 98–111)
Creatinine, Ser: 0.8 mg/dL (ref 0.44–1.00)
GFR, Estimated: >60 mL/min (ref >60)
Glucose, Bld: 122 mg/dL — ABNORMAL HIGH (ref 70–99)
Potassium: 4 mmol/L (ref 3.5–5.1)
Sodium: 137 mmol/L (ref 135–145)
Total Bilirubin: 0.7 mg/dL (ref 0.3–1.2)
Total Protein: 7.7 g/dL (ref 6.5–8.1)

## 2021-07-04 LAB — CBC
HCT: 46.2 % — ABNORMAL HIGH (ref 36.0–46.0)
Hemoglobin: 15.1 g/dL — ABNORMAL HIGH (ref 12.0–15.0)
MCH: 27.6 pg (ref 26.0–34.0)
MCHC: 32.7 g/dL (ref 30.0–36.0)
MCV: 84.3 fL (ref 80.0–100.0)
Platelets: 201 10*3/uL (ref 150–400)
RBC: 5.48 MIL/uL — ABNORMAL HIGH (ref 3.87–5.11)
RDW: 14.2 % (ref 11.5–15.5)
WBC: 19.1 10*3/uL — ABNORMAL HIGH (ref 4.0–10.5)
nRBC: 0 % (ref 0.0–0.2)

## 2021-07-04 LAB — OCCULT BLOOD X 1 CARD TO LAB, STOOL: Fecal Occult Bld: POSITIVE — AB

## 2021-07-04 LAB — RESP PANEL BY RT-PCR (FLU A&B, COVID) ARPGX2
Influenza A by PCR: NEGATIVE
Influenza B by PCR: NEGATIVE
SARS Coronavirus 2 by RT PCR: NEGATIVE

## 2021-07-04 LAB — LIPASE, BLOOD: Lipase: 17 U/L (ref 11–51)

## 2021-07-04 MED ORDER — ACETAMINOPHEN 325 MG PO TABS
650.0000 mg | ORAL_TABLET | Freq: Four times a day (QID) | ORAL | Status: DC | PRN
  Administered 2021-07-06: 650 mg via ORAL
  Filled 2021-07-04: qty 2

## 2021-07-04 MED ORDER — SODIUM CHLORIDE 0.9 % IV BOLUS
1000.0000 mL | Freq: Once | INTRAVENOUS | Status: AC
  Administered 2021-07-04: 1000 mL via INTRAVENOUS

## 2021-07-04 MED ORDER — ONDANSETRON HCL 4 MG/2ML IJ SOLN
4.0000 mg | Freq: Once | INTRAMUSCULAR | Status: AC
  Administered 2021-07-04: 4 mg via INTRAVENOUS
  Filled 2021-07-04: qty 2

## 2021-07-04 MED ORDER — ACETAMINOPHEN 650 MG RE SUPP: 650.0000 mg | Freq: Four times a day (QID) | RECTAL | Status: DC | PRN

## 2021-07-04 MED ORDER — PANTOPRAZOLE SODIUM 40 MG IV SOLR
40.0000 mg | Freq: Once | INTRAVENOUS | Status: AC
  Administered 2021-07-04: 40 mg via INTRAVENOUS
  Filled 2021-07-04: qty 40

## 2021-07-04 MED ORDER — FENTANYL CITRATE PF 50 MCG/ML IJ SOSY
50.0000 ug | PREFILLED_SYRINGE | Freq: Once | INTRAMUSCULAR | Status: AC
Start: 2021-07-04 — End: 2021-07-04
  Administered 2021-07-04: 50 ug via INTRAVENOUS
  Filled 2021-07-04: qty 1

## 2021-07-04 MED ORDER — PANTOPRAZOLE INFUSION (NEW) - SIMPLE MED
8.0000 mg/h | INTRAVENOUS | Status: DC
  Administered 2021-07-05 (×2): 8 mg/h via INTRAVENOUS
  Filled 2021-07-04 (×2): qty 80

## 2021-07-04 MED ORDER — IOHEXOL 300 MG/ML  SOLN
100.0000 mL | Freq: Once | INTRAMUSCULAR | Status: AC | PRN
  Administered 2021-07-04: 100 mL via INTRAVENOUS

## 2021-07-04 NOTE — ED Provider Notes (Signed)
[facility] EMERGENCY DEPT Provider Note   CSN: [id number] Arrival date & time: 07/04/21  1559     History Chief Complaint  Patient presents with  . Abdominal Pain  . Chills    Cristina Sawyer is a 80 y.o. female.   Abdominal Pain Associated symptoms: chills, nausea and vomiting   Associated symptoms: no chest pain, no cough, no dysuria, no fever, no hematuria, no shortness of breath and no sore throat    Patient presents with abdominal pain.  Started last night, it worsened this morning.  The pain is constant, nothing seems to alleviate the pain.  The pain is primarily to the lower quadrants diffusely, does radiate to the back.  Last night she also noticed abdominal distention, she was nauseated and had multiple episodes of emesis.  She reports this morning she had dark and tarry stools with some blood clots.  She has not on any blood thinners, no history of GI bleed.  Patient does report heavy anti-inflammatory use due to arthritis in her knees.  Denies any alcohol use.  She does have history of diverticulitis.  She is status post hysterectomy, appendectomy.  Denies any other abdominal surgeries.  Past Medical History:  Diagnosis Date  . Arthritis   . Cataract   . Diverticulosis   . Dyslipidemia   . GERD (gastroesophageal reflux disease)   . HTN (hypertension) 2015  . Personal history of colonic adenoma 08/09/2008  . Vitamin D deficiency     Patient Active Problem List   Diagnosis Date Noted  . Personal history of calcium pyrophosphate deposition disease (CPPD) 04/11/2020  . History of vitamin D deficiency 07/26/2019  . Age-related cataract of both eyes 05/19/2018  . Hyperlipidemia 05/19/2018  . CKD (chronic kidney disease) stage 3, GFR 30-59 ml/min ([facility]) 02/14/2018  . Essential hypertension 05/12/2017  . Transient cerebral ischemia 05/12/2017  . Seasonal allergic rhinitis due to pollen 12/29/2014  . GERD (gastroesophageal reflux disease) 01/17/2014  .  Personal history of colonic adenoma 08/09/2008    Past Surgical History:  Procedure Laterality Date  . ABDOMINAL HYSTERECTOMY  1975  . APPENDECTOMY  1975  . COLONOSCOPY    . THYROID SURGERY  2000   to remove nodule     OB History   No obstetric history on file.     Family History  Problem Relation Age of Onset  . Heart disease Sister   . Heart disease Sister        in her 67s  . Colon cancer Neg Hx     Social History   Tobacco Use  . Smoking status: Never  . Smokeless tobacco: Never  Vaping Use  . Vaping Use: Never used  Substance Use Topics  . Alcohol use: Yes    Alcohol/week: 1.0 standard drink    Types: 1 Glasses of wine per week  . Drug use: No    Home Medications Prior to Admission medications   Medication Sig Start Date End Date Taking? Authorizing Provider  ALPRAZolam (XANAX) 0.25 MG tablet Take 1 tablet (0.25 mg total) by mouth 2 (two) times daily as needed for anxiety. 07/26/19   Denita Lung, MD  amLODipine (NORVASC) 10 MG tablet TAKE 1 TABLET BY MOUTH EVERY DAY 08/18/20   Denita Lung, MD  amoxicillin-clavulanate (AUGMENTIN) 875-125 MG tablet Take 1 tablet by mouth 2 (two) times daily. Patient not taking: Reported on 04/03/2021 03/05/21   Denita Lung, MD  aspirin EC 81 MG tablet Take 81 mg  by mouth daily.    [provider]  azithromycin (ZITHROMAX) 500 MG tablet Take 1 tablet (500 mg total) by mouth daily. Patient not taking: Reported on 04/03/2021 09/13/20   Denita Lung, MD  betamethasone dipropionate (DIPROLENE) 0.05 % ointment Apply topically. Patient not taking: No sig reported 07/17/20   [provider]  cetirizine (ZYRTEC) 10 MG tablet Take 10 mg by mouth daily as needed for allergies.     [provider]  clotrimazole-betamethasone (LOTRISONE) cream Apply 1 application topically 2 (two) times daily. Patient not taking: No sig reported 12/24/19   Henson, Vickie L, PA-C  rosuvastatin (CRESTOR) 5 MG tablet TAKE 1  TABLET BY MOUTH EVERY DAY 07/10/20   Denita Lung, MD  triamcinolone cream (KENALOG) 0.1 % Apply 1 application topically 2 (two) times daily. Patient not taking: No sig reported 06/06/20   Denita Lung, MD  VITAMIN D PO Take 2,000 Units by mouth.    [provider]  omeprazole (PRILOSEC OTC) 20 MG tablet Take 20 mg by mouth daily.    12/11/11  [provider]    Allergies    Codeine and Ciprofloxacin hcl  Review of Systems   Review of Systems  Constitutional:  Positive for chills. Negative for fever.  HENT:  Negative for ear pain and sore throat.   Eyes:  Negative for pain and visual disturbance.  Respiratory:  Negative for cough and shortness of breath.   Cardiovascular:  Negative for chest pain and palpitations.  Gastrointestinal:  Positive for abdominal distention, abdominal pain, blood in stool, nausea, rectal pain and vomiting.  Genitourinary:  Negative for dysuria and hematuria.  Musculoskeletal:  Positive for back pain. Negative for arthralgias.  Skin:  Negative for color change and rash.  Neurological:  Negative for seizures and syncope.  All other systems reviewed and are negative.  Physical Exam Updated Vital Signs BP (!) 152/67 (BP Location: Right Arm)   Pulse 89   Temp 98.1 F (36.7 C)   Resp 18   Ht 5\' 4"  (1.626 m)   Wt 84.4 kg   SpO2 100%   BMI 31.93 kg/m   Physical Exam Vitals and nursing note reviewed. Exam conducted with a chaperone present.  Constitutional:      Appearance: Normal appearance.     Comments: Patient appears uncomfortable  HENT:     Head: Normocephalic and atraumatic.  Eyes:     General: No scleral icterus.       Right eye: No discharge.        Left eye: No discharge.     Extraocular Movements: Extraocular movements intact.     Pupils: Pupils are equal, round, and reactive to light.  Cardiovascular:     Rate and Rhythm: Normal rate and regular rhythm.     Pulses: Normal pulses.     Heart sounds: Normal heart  sounds. No murmur heard.   No friction rub. No gallop.  Pulmonary:     Effort: Pulmonary effort is normal. No respiratory distress.     Breath sounds: Normal breath sounds.  Abdominal:     General: Abdomen is flat. Bowel sounds are normal. There is no distension.     Palpations: Abdomen is soft.     Tenderness: There is abdominal tenderness in the right lower quadrant, periumbilical area and suprapubic area. There is guarding.     Comments: Abdomen is soft, but there is guarding Rectal exam; no external hemorrhoids, no appreciable bleeding.  The stool does  not appear melanic, not bright red  Skin:    General: Skin is warm and dry.     Coloration: Skin is not jaundiced.  Neurological:     Mental Status: She is alert. Mental status is at baseline.     Coordination: Coordination normal.    ED Results / Procedures / Treatments   Labs (all labs ordered are listed, but only abnormal results are displayed) Labs Reviewed  CBC - Abnormal; Notable for the following components:      Result Value   WBC 19.1 (*)    RBC 5.48 (*)    Hemoglobin 15.1 (*)    HCT 46.2 (*)    All other components within normal limits  URINALYSIS, ROUTINE W REFLEX MICROSCOPIC  COMPREHENSIVE METABOLIC PANEL  LIPASE, BLOOD    EKG None  Radiology No results found.  Procedures Procedures   Medications Ordered in ED Medications  sodium chloride 0.9 % bolus 1,000 mL (has no administration in time range)  pantoprazole (PROTONIX) injection 40 mg (has no administration in time range)  ondansetron (ZOFRAN) injection 4 mg (has no administration in time range)  fentaNYL (SUBLIMAZE) injection 50 mcg (has no administration in time range)    ED Course  I have reviewed the triage vital signs and the nursing notes.  Pertinent labs & imaging results that were available during my care of the patient were reviewed by me and considered in my medical decision making (see chart for details).  Clinical Course as of  07/04/21 2018  Wed Jul 04, 2021  1940 CT ABDOMEN PELVIS W CONTRAST [HS]    Clinical Course User Index [HS] Sherrill Raring, [place]   MDM Rules/Calculators/A&P                           Patient has an impressive leukocytosis to 60, she is also complaining of rectal bleeding.  She is fecal occult positive, will give Protonix, fluids, pain medicine.  She is tender with guarding to the abdomen, will proceed with CT abdomen.  CT abdomen shows diverticulosis without diverticulitis, does not give explanation for her significant leukocytosis.  I suspect the leukocytosis could be secondary to inflammatory changes due to the GI bleed.  Overall, patient is currently hemodynamic stable without tachycardia or hypotension.  However, there is concern that she could have an acute GI bleed if released.  Patient will need admission for GI bleed -BUN is not elevated, does not appear to be an acute bleed. But given age, leukocytosis and FOBT+ patient should be admitted.   She is seen the [facility] in the past. Last colonoscopy in 2015 without abnormalities.   Final Clinical Impression(s) / ED Diagnoses Final diagnoses:  None    Rx / DC Orders ED Discharge Orders     None        Sherrill Raring, Hershal Coria 07/04/21 2018    Lucrezia Starch, MD 07/05/21 [phone]

## 2021-07-04 NOTE — ED Notes (Signed)
ED Provider at bedside. 

## 2021-07-04 NOTE — H&P (Signed)
History and Physical    PLEASE NOTE THAT DRAGON DICTATION SOFTWARE WAS USED IN THE CONSTRUCTION OF THIS NOTE.   RAJVI ARMENTOR IFO:[id number] DOB: 03/15/1941 DOA: 07/04/2021  PCP: Denita Lung, MDPatient coming from: home   I have personally briefly reviewed patient's old medical records in [place]  Chief Complaint: Abdominal pain  HPI: Cristina Sawyer is a 80 y.o. female with medical history significant for hypertension, diverticulosis, generalized anxiety disorder, hyperlipidemia who is admitted to [facility] on 07/04/2021 by way of transfer from [facility] emergency department with suspected acute upper gastrointestinal bleed after presenting from home to the latter facility complaining of abdominal pain.  The patient reports 1 -2 days of new onset midline lower abdominal discomfort, which she states is sharp in nature and nonradiating.  She reports that the pain has been intermittent, and notes exacerbation with palpation over this portion of the abdomen.  Denies any preceding trauma or any recent travel.  She also notes intermittent nausea resulting in 2 episodes of nonbloody, nonbilious emesis, with most recent episode occurring just prior to presenting to the [facility] emergency department earlier today.  She also conveys development of dark-colored stool over the last 4 to 5 days, noting 1 such episode per day over that timeframe.  Not associated with any hematochezia.  Denies any associated loose stool.  She also denies any associated chest pain, shortness of breath, palpitations, diaphoresis, dizziness, presyncope, or syncope.  Not associate with any recent travel or subjective fever, chills, rigors, or generalized myalgias.  Denies any recent rash.  No recent dysuria, gross hematuria, or change in urinary urgency/frequency.  No recent cough or hemoptysis.  She denies any known prior history of gastrointestinal bleed.  She reports 1 prior EGD, that she states  occurred at least 10 to 15 years ago, but does not recall the specific indication prompting EGD at that time.  Denies any ensuing EGD evaluation.  She also reports colonoscopy in 2009, with chart review revealing finding of adenoma at that time.  Patient reports that she underwent repeat colonoscopy approximately 5 years ago, and does not believe there were any notable acute findings at that time.  Per chart review, she has a history of diverticulosis.  In the setting of progressive arthralgias involving the right knee, she reports that she has been taking ibuprofen 400 mg 3-4 times a day every day for the last few weeks.  She also acknowledges a history of GERD, but is not on any H2 blocker or PPI at this time.  She otherwise denies any use of NSAIDs as an outpatient.  She is on a daily baby aspirin, but otherwise denies any use of blood thinners at home.  Denies any regular or recent consumption of alcohol, denies any known history of underlying chronic liver disease.    [facility] ED Course:  Vital signs in the ED were notable for the following: Afebrile, heart rate 64-89; blood pressure 127/64-130 7/68; respiratory rate 15-20, oxygen saturation 98 to 100% on room air.  Labs were notable for the following: CMP notable for sodium 137, BUN 12, creatinine 0.80 compared to 1.08 in November 2021, and liver enzymes found to be within normal limits.  Lipase 17.  CBC notable for white blood cell count 19,000, hemoglobin 15.1 associated normocytic/normochromic findings as well as nonelevated RDW, relative to most recent prior hemoglobin value 14.9 in November 2021, platelet count 201.  INR 1.1.  Urinalysis showed no white blood cells, nitrate negative, leukocyte  Estrace negative.  Rectal exam by EDP at [facility] ED showed no evidence of gross blood, but was associated with fecal occult positive finding.  Screening COVID-19 PCR performed at the ED without be negative.  Imaging and additional notable ED work-up:  CT abdomen/pelvis with contrast showed evidence of diverticulosis, without evidence of diverticulitis, and showed no evidence of colonic obstruction, abscess, or perforation, nor any evidence of colonic inflammatory changes.  While in the ED, the following were administered: Fentanyl 50 mcg IV x2, Zofran 4 mg IV x1, Protonix 40 mg IV x1, normal saline x1 L bolus persistently, the patient was transferred to [facility] for further evaluation management of suspected acute upper gastrointestinal bleed.     Review of Systems: As per HPI otherwise 10 point review of systems negative.   Past Medical History:  Diagnosis Date   Arthritis    Cataract    Diverticulosis    Dyslipidemia    GERD (gastroesophageal reflux disease)    HTN (hypertension) 2015   Personal history of colonic adenoma 08/09/2008   Vitamin D deficiency     Past Surgical History:  Procedure Laterality Date   [place]   COLONOSCOPY     THYROID SURGERY  2000   to remove nodule    Social History:  reports that she has never smoked. She has never used smokeless tobacco. She reports current alcohol use of about 1.0 standard drink per week. She reports that she does not use drugs.   Allergies  Allergen Reactions   Codeine     REACTION: itch/nausea   Ciprofloxacin Hcl Rash    Family History  Problem Relation Age of Onset   Heart disease Sister    Heart disease Sister        in her 44s   Colon cancer Neg Hx     Family history reviewed and not pertinent    Prior to Admission medications   Medication Sig Start Date End Date Taking? Authorizing Provider  ALPRAZolam (XANAX) 0.25 MG tablet Take 1 tablet (0.25 mg total) by mouth 2 (two) times daily as needed for anxiety. 07/26/19   Denita Lung, MD  amLODipine (NORVASC) 10 MG tablet TAKE 1 TABLET BY MOUTH EVERY DAY 08/18/20   Denita Lung, MD  amoxicillin-clavulanate (AUGMENTIN) 875-125 MG tablet Take 1 tablet by mouth 2  (two) times daily. Patient not taking: Reported on 04/03/2021 03/05/21   Denita Lung, MD  aspirin EC 81 MG tablet Take 81 mg by mouth daily.    [provider]  azithromycin (ZITHROMAX) 500 MG tablet Take 1 tablet (500 mg total) by mouth daily. Patient not taking: Reported on 04/03/2021 09/13/20   Denita Lung, MD  betamethasone dipropionate (DIPROLENE) 0.05 % ointment Apply topically. Patient not taking: No sig reported 07/17/20   [provider]  cetirizine (ZYRTEC) 10 MG tablet Take 10 mg by mouth daily as needed for allergies.     [provider]  clotrimazole-betamethasone (LOTRISONE) cream Apply 1 application topically 2 (two) times daily. Patient not taking: No sig reported 12/24/19   Henson, Vickie L, PA-C  rosuvastatin (CRESTOR) 5 MG tablet TAKE 1 TABLET BY MOUTH EVERY DAY 07/10/20   Denita Lung, MD  triamcinolone cream (KENALOG) 0.1 % Apply 1 application topically 2 (two) times daily. Patient not taking: No sig reported 06/06/20   Denita Lung, MD  VITAMIN D PO Take 2,000 Units by mouth.    [provider]  omeprazole (PRILOSEC OTC) 20 MG tablet Take 20 mg by mouth daily.    12/11/11  [provider]     Objective    Physical Exam: Vitals:   07/04/21 2130 07/04/21 2230 07/04/21 2245 07/04/21 2322  BP: 137/61 (!) 130/52 137/68 (!) 133/56  Pulse: 67 61 74 64  Resp: 11 20 15 16   Temp:    97.7 F (36.5 C)  TempSrc:    Oral  SpO2: 98% 98% 100% 98%  Weight:      Height:        General: appears to be stated age; alert, oriented Skin: warm, dry, no rash Head:  AT/ Mouth:  Oral mucosa membranes appear moist, normal dentition Neck: supple; trachea midline Heart:  RRR; did not appreciate any M/R/G Lungs: CTAB, did not appreciate any wheezes, rales, or rhonchi Abdomen: + BS; soft, ND; mild tenderness to palpation over the midline portion of the lower abdomen, in the absence of any associated guarding, rigidity, or rebound  tenderness. Vascular: 2+ pedal pulses b/l; 2+ radial pulses b/l Extremities: no peripheral edema, no muscle wasting Neuro: strength and sensation intact in upper and lower extremities b/l   Labs on Admission: I have personally reviewed following labs and imaging studies  CBC: Recent Labs  Lab 07/04/21 1626  WBC 19.1*  HGB 15.1*  HCT 46.2*  MCV 84.3  PLT 132   Basic Metabolic Panel: Recent Labs  Lab 07/04/21 1724  NA 137  K 4.0  CL 105  CO2 19*  GLUCOSE 122*  BUN 12  CREATININE 0.80  CALCIUM 9.9   GFR: Estimated Creatinine Clearance: 59 mL/min (by C-G formula based on SCr of 0.8 mg/dL). Liver Function Tests: Recent Labs  Lab 07/04/21 1724  AST 21  ALT 10  ALKPHOS 82  BILITOT 0.7  PROT 7.7  ALBUMIN 4.6   Recent Labs  Lab 07/04/21 1724  LIPASE 17   No results for input(s): AMMONIA in the last 168 hours. Coagulation Profile: No results for input(s): INR, PROTIME in the last 168 hours. Cardiac Enzymes: No results for input(s): CKTOTAL, CKMB, CKMBINDEX, TROPONINI in the last 168 hours. BNP (last 3 results) No results for input(s): PROBNP in the last 8760 hours. HbA1C: No results for input(s): HGBA1C in the last 72 hours. CBG: No results for input(s): GLUCAP in the last 168 hours. Lipid Profile: No results for input(s): CHOL, HDL, LDLCALC, TRIG, CHOLHDL, LDLDIRECT in the last 72 hours. Thyroid Function Tests: No results for input(s): TSH, T4TOTAL, FREET4, T3FREE, THYROIDAB in the last 72 hours. Anemia Panel: No results for input(s): VITAMINB12, FOLATE, FERRITIN, TIBC, IRON, RETICCTPCT in the last 72 hours. Urine analysis:    Component Value Date/Time   COLORURINE YELLOW 07/04/2021 [place] 07/04/2021 1724   LABSPEC 1.018 07/04/2021 1724   LABSPEC 1.020 08/09/2019 1321   PHURINE 6.0 07/04/2021 1724   GLUCOSEU NEGATIVE 07/04/2021 1724   HGBUR SMALL (A) 07/04/2021 1724   BILIRUBINUR NEGATIVE 07/04/2021 1724   BILIRUBINUR small (A)  08/09/2019 1321   BILIRUBINUR n 04/22/2016 1558   KETONESUR 15 (A) 07/04/2021 1724   PROTEINUR 30 (A) 07/04/2021 1724   UROBILINOGEN negative 04/22/2016 1558   UROBILINOGEN 0.2 03/04/2011 1342   NITRITE NEGATIVE 07/04/2021 1724   LEUKOCYTESUR NEGATIVE 07/04/2021 1724    Radiological Exams on Admission: CT ABDOMEN PELVIS W CONTRAST  Result Date: 07/04/2021 CLINICAL DATA:  Abdominal pain and chills for 1 night, history of recent bloody stool, initial encounter EXAM:  CT ABDOMEN AND PELVIS WITH CONTRAST TECHNIQUE: Multidetector CT imaging of the abdomen and pelvis was performed using the standard protocol following bolus administration of intravenous contrast. CONTRAST:  133mL OMNIPAQUE IOHEXOL 300 MG/ML  SOLN COMPARISON:  None. FINDINGS: Lower chest: No acute abnormality. Hepatobiliary: No focal liver abnormality is seen. No gallstones, gallbladder wall thickening, or biliary dilatation. Pancreas: Unremarkable. No pancreatic ductal dilatation or surrounding inflammatory changes. Spleen: Normal in size without focal abnormality. Adrenals/Urinary Tract: Adrenal glands are within normal limits. Right kidney is well visualized with small cysts. No renal calculi or urinary tract obstructive changes are noted. 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 Cystic changes are noted in the left kidney. No renal calculi or obstructive changes are noted. Bladder is partially distended. Stomach/Bowel: Colon demonstrates significant diverticular change without evidence of diverticulitis. More proximal colon is decompressed. The appendix is within normal limits. Small bowel and stomach are unremarkable. Vascular/Lymphatic: Aortic atherosclerosis. No enlarged abdominal or pelvic lymph nodes. Reproductive: Status post hysterectomy. No adnexal masses. Other: No abdominal wall hernia or abnormality. No abdominopelvic ascites. Musculoskeletal: Degenerative changes of lumbar spine are  noted. IMPRESSION: Diverticulosis without diverticulitis. No obstructive or inflammatory changes are noted. Ptotic left kidney as described without complicating factors. No other focal abnormality is noted. Electronically Signed   By: Inez Catalina M.D.   On: 07/04/2021 19:19      Assessment/Plan      Principal Problem:   Acute upper GI bleed Active Problems:   Essential hypertension   Hyperlipidemia   Leukocytosis   Abdominal pain   GAD (generalized anxiety disorder)     #) Acute Upper GI Bleed: diagnosis on the basis of 4 to 5 days of new onset melena, with with DRE revealing Hemoccult positive stool.  Of note, does not appear to be associated with acute blood loss anemia, as further quantified above, although will continue to closely monitor for ensuing development of such, as further described below.  Risk factors for development of acute gastrointestinal bleeding include her recent use of ibuprofen which she has been taking 4 mg 3-4 times a day every day over the last several weeks.  Additionally, she is on a daily baby aspirin, but otherwise on no blood thinners at home. No known history of known underlying liver disease, and denies any history of alcohol abuse or recent alcohol consumption.  Most recent EGD, per patient, currently is 10 to 15 years ago.  In the absence of known liver disease, initiation of SBP prophylaxis does not appear to be warranted. Presentation and history are less suggestive of variceal bleed, and therefore there does not appear to be an indication for octreotide. Given suspected upper GI source, will initiate Protonix drip. At this time ddx broadly includes, but is not limited to gastritis vs erosive esophagitis vs PUD (gastric versus duodenal distribution) vs Dieulafoy lesion vs AVM.    At this time, the patient appears hemodynamically stable, with normotensive blood pressures in the absence of any associated tachycardia.  At this time, she is asymptomatic.  will type and screen patient at this time, although there does not appear to be an overt indication for initiation of empiric PRBC transfusion at the present.       Plan: NPO. Refraining from pharmacologic DVT prophylaxis. Monitor on telemetry. Monitor continuous pulse-ox. Maintain at least 2 large bore IV's. Check INR. Q4H H&H's have been ordered through 9 AM on 07/05/21. Will closely monitor these ensuing Hgb levels and  correlate these data points with the patient's overall clinical picture including vital signs to determine need for transfusion. Consider GI consult in the AM. CMP in the AM. Protonix drip.  Hold home aspirin and ibuprofen.  Gentle IV fluids in the form of lactated Ringer's at 50 cc/h.  As needed IV fentanyl for abdominal pain.  As needed IV Zofran.        #) Leukocytosis, presenting CBC demonstrates evidence of count of 19,000, with suspected element of hemoconcentration in the setting of recent GI losses as well as associated recent decline in oral intake.  There may also be a inflammatory component in the setting of suspected presenting acute upper gastrointestinal bleed.  No evidence of underlying infectious process at this time.  No acute respiratory symptoms to warrant evaluation via chest x-ray at this time.  Of note, COVID-19/influenza PCR found to be negative, while urinalysis is not suggestive of UTI.  Plan: Gentle IV fluids, as above.  Further evaluation management of suspected presenting acute upper gastrointestinal bleed, as above.  Repeat CBC with differential in the morning.      #) Essential hypertension: Documented history of such, on Norvasc as an outpatient.  Blood pressures been normotensive thus far.  Plan: In the setting of suspected acute upper gastrointestinal bleed, will hold home Norvasc for now.  Close monitoring of ensuing blood pressure via routine vital signs.     #) Generalized anxiety disorder: On as needed Xanax at home.  Plan: In the  setting of her n.p.o. status, will hold home as needed Xanax for now.  Instead order as needed IV Ativan for anxiety.      #) Hyperlipidemia: On rosuvastatin as an outpatient.  Plan: In the setting of current n.p.o. status, hold home statin for now.     DVT prophylaxis: scd's   Code Status: dnr Family Communication: none Disposition Plan: Per Rounding Team Consults called: none;  Admission status:      Of note, this patient was added by me to the following Admit List/Treatment Team: [email or web address].    Of note, the Adult Admission Order Set (Multimorbid order set) was used by me in the admission process for this patient.   PLEASE NOTE THAT DRAGON DICTATION SOFTWARE WAS USED IN THE CONSTRUCTION OF THIS NOTE.   Rhetta Mura DO [facility] Pager [phone] From [place]   07/04/2021, 11:40 PM

## 2021-07-04 NOTE — ED Notes (Signed)
Called [facility] to transport patient to [facility] 4W room# 1426 ?

## 2021-07-04 NOTE — ED Triage Notes (Signed)
Pt states she developed abd pain and chills last night. Reports bloody stool x 1 dark in color.

## 2021-07-05 ENCOUNTER — Encounter (HOSPITAL_COMMUNITY): Payer: Self-pay | Admitting: Family Medicine

## 2021-07-05 DIAGNOSIS — R109 Unspecified abdominal pain: Secondary | ICD-10-CM | POA: Diagnosis present

## 2021-07-05 DIAGNOSIS — Z7982 Long term (current) use of aspirin: Secondary | ICD-10-CM | POA: Diagnosis not present

## 2021-07-05 DIAGNOSIS — Z79899 Other long term (current) drug therapy: Secondary | ICD-10-CM | POA: Diagnosis not present

## 2021-07-05 DIAGNOSIS — D72829 Elevated white blood cell count, unspecified: Secondary | ICD-10-CM | POA: Diagnosis present

## 2021-07-05 DIAGNOSIS — I10 Essential (primary) hypertension: Secondary | ICD-10-CM | POA: Diagnosis not present

## 2021-07-05 DIAGNOSIS — K922 Gastrointestinal hemorrhage, unspecified: Secondary | ICD-10-CM | POA: Diagnosis not present

## 2021-07-05 DIAGNOSIS — Z20822 Contact with and (suspected) exposure to covid-19: Secondary | ICD-10-CM | POA: Diagnosis not present

## 2021-07-05 DIAGNOSIS — F411 Generalized anxiety disorder: Secondary | ICD-10-CM | POA: Diagnosis present

## 2021-07-05 LAB — COMPREHENSIVE METABOLIC PANEL
ALT: 17 U/L (ref 0–44)
AST: 19 U/L (ref 15–41)
Albumin: 4.2 g/dL (ref 3.5–5.0)
Alkaline Phosphatase: 81 U/L (ref 38–126)
Anion gap: 9 (ref 5–15)
BUN: 10 mg/dL (ref 8–23)
CO2: 25 mmol/L (ref 22–32)
Calcium: 9.4 mg/dL (ref 8.9–10.3)
Chloride: 107 mmol/L (ref 98–111)
Creatinine, Ser: 0.89 mg/dL (ref 0.44–1.00)
GFR, Estimated: >60 mL/min (ref >60)
Glucose, Bld: 106 mg/dL — ABNORMAL HIGH (ref 70–99)
Potassium: 3.7 mmol/L (ref 3.5–5.1)
Sodium: 141 mmol/L (ref 135–145)
Total Bilirubin: 0.9 mg/dL (ref 0.3–1.2)
Total Protein: 7.7 g/dL (ref 6.5–8.1)

## 2021-07-05 LAB — ABO/RH: ABO/RH(D): B POS

## 2021-07-05 LAB — CBC WITH DIFFERENTIAL/PLATELET
Abs Immature Granulocytes: 0.17 10*3/uL — ABNORMAL HIGH (ref 0.00–0.07)
Basophils Absolute: 0 10*3/uL (ref 0.0–0.1)
Basophils Relative: 0 %
Eosinophils Absolute: 0 10*3/uL (ref 0.0–0.5)
Eosinophils Relative: 0 %
HCT: 44.7 % (ref 36.0–46.0)
Hemoglobin: 14.4 g/dL (ref 12.0–15.0)
Immature Granulocytes: 1 %
Lymphocytes Relative: 16 %
Lymphs Abs: 2.2 10*3/uL (ref 0.7–4.0)
MCH: 27.9 pg (ref 26.0–34.0)
MCHC: 32.2 g/dL (ref 30.0–36.0)
MCV: 86.6 fL (ref 80.0–100.0)
Monocytes Absolute: 0.7 10*3/uL (ref 0.1–1.0)
Monocytes Relative: 5 %
Neutro Abs: 11.1 10*3/uL — ABNORMAL HIGH (ref 1.7–7.7)
Neutrophils Relative %: 78 %
Platelets: 224 10*3/uL (ref 150–400)
RBC: 5.16 MIL/uL — ABNORMAL HIGH (ref 3.87–5.11)
RDW: 14.4 % (ref 11.5–15.5)
WBC: 14.2 10*3/uL — ABNORMAL HIGH (ref 4.0–10.5)
nRBC: 0 % (ref 0.0–0.2)

## 2021-07-05 LAB — MAGNESIUM: Magnesium: 1.8 mg/dL (ref 1.7–2.4)

## 2021-07-05 LAB — TYPE AND SCREEN
ABO/RH(D): B POS
Antibody Screen: NEGATIVE

## 2021-07-05 LAB — PROTIME-INR
INR: 1.1 (ref 0.8–1.2)
Prothrombin Time: 13.7 seconds (ref 11.4–15.2)

## 2021-07-05 LAB — HEMOGLOBIN AND HEMATOCRIT, BLOOD
HCT: 39.6 % (ref 36.0–46.0)
Hemoglobin: 13.2 g/dL (ref 12.0–15.0)

## 2021-07-05 MED ORDER — LACTATED RINGERS IV SOLN: INTRAVENOUS | Status: DC

## 2021-07-05 MED ORDER — ONDANSETRON HCL 4 MG/2ML IJ SOLN: 4.0000 mg | Freq: Four times a day (QID) | INTRAMUSCULAR | Status: DC | PRN

## 2021-07-05 MED ORDER — POLYETHYLENE GLYCOL 3350 17 G PO PACK
17.0000 g | PACK | Freq: Every day | ORAL | Status: DC | PRN
  Administered 2021-07-06: 17 g via ORAL
  Filled 2021-07-05: qty 1

## 2021-07-05 MED ORDER — LORAZEPAM 2 MG/ML IJ SOLN
0.5000 mg | Freq: Four times a day (QID) | INTRAMUSCULAR | Status: DC | PRN
Start: 2021-07-05 — End: 2021-07-05

## 2021-07-05 MED ORDER — ALPRAZOLAM 0.25 MG PO TABS: 0.2500 mg | ORAL_TABLET | Freq: Two times a day (BID) | ORAL | Status: DC | PRN

## 2021-07-05 MED ORDER — PANTOPRAZOLE SODIUM 40 MG PO TBEC
40.0000 mg | DELAYED_RELEASE_TABLET | Freq: Every day | ORAL | Status: DC
  Administered 2021-07-05 – 2021-07-06 (×2): 40 mg via ORAL
  Filled 2021-07-05 (×2): qty 1

## 2021-07-05 MED ORDER — ROSUVASTATIN CALCIUM 5 MG PO TABS
5.0000 mg | ORAL_TABLET | Freq: Every day | ORAL | Status: DC
  Administered 2021-07-05 – 2021-07-06 (×2): 5 mg via ORAL
  Filled 2021-07-05 (×2): qty 1

## 2021-07-05 MED ORDER — FENTANYL CITRATE PF 50 MCG/ML IJ SOSY: 25.0000 ug | PREFILLED_SYRINGE | INTRAMUSCULAR | Status: DC | PRN

## 2021-07-05 MED ORDER — NALOXONE HCL 0.4 MG/ML IJ SOLN: 0.4000 mg | INTRAMUSCULAR | Status: DC | PRN

## 2021-07-05 NOTE — Progress Notes (Signed)
Mobility Specialist - Progress Note    07/05/21 1510  Mobility  Activity Ambulated in hall  Level of Assistance Independent  Assistive Device None  Distance Ambulated (ft) 700 ft  Mobility Ambulated independently in hallway  Mobility Response Tolerated well  Mobility performed by Mobility specialist  $Mobility charge 1 Mobility   Pt eager to ambulate upon arrival and required no AD to walk 700 ft in hallway. Pt c/o of R knee pain and stated it has been bothering her prior to hospitalization. No other pain, dizziness, or SOB to report at this time. Pt returned to room after session and was left with family in room.   [place] Specialist Acute Rehabilitation Services Phone: [phone] 07/05/21, 3:12 PM

## 2021-07-05 NOTE — Progress Notes (Signed)
PROGRESS NOTE    Cristina Sawyer  GGY:[id number] DOB: 08-Jun-1941 DOA: 07/04/2021 PCP: Denita Lung, MD    Brief Narrative:  80 y.o. female with medical history significant for hypertension, diverticulosis, generalized anxiety disorder, hyperlipidemia who is admitted to [facility] on 07/04/2021 by way of transfer from [facility] emergency department with suspected acute upper gastrointestinal bleed after presenting from home to the latter facility complaining of abdominal pain with bloody stool, hemaccult noted to be pos. Initial concerns for UGI bleeding. On further questioning, symptoms began 8-12hrs after eating questionable foods (either fried okra from [employer] or home made beans) the night before onset of symptoms. Patient reported fried chicken restaurant appeared dirty and not sanitary.   Assessment & Plan:   Principal Problem:   Acute upper GI bleed Active Problems:   Essential hypertension   Hyperlipidemia   Leukocytosis   Abdominal pain   GAD (generalized anxiety disorder)   Likely infectious gastroenteritis -onset of symptoms appeared to correspond to eating of questionable food items -Reported lower quadrant abd discomfort and cramping, no epigastric pain. No hematemesis or vomiting coffee ground type emesis -Hgb has remained stable thus far -Rapid improvement overnight since presentation with pt reporting "80%" improvement -Will begin clears, advance as tolerated -Discussed case with GI who agrees with likely infectious gastroenteritis. Would recommend close GI f/u as outpt when discharged  Leukocytosis -Suspect secondary to above gastroenteritis that is now improving -WBC trend is improving   HTN -BP stable and controlled -Would anticipate resuming home bp regimen soon    GAD -on PRN xanax prior to admit -Will cont as needed   Hyperlipidemia:  -On rosuvastatin as an outpatient, will continue  DVT prophylaxis: SCD's Code Status:  DNR Family Communication: Pt in room, family not at bedside  Status is: Observation  The patient remains OBS appropriate and will d/c before 2 midnights.   Consultants:  Discussed case with [facility] over phone  Procedures:    Antimicrobials: Anti-infectives (From admission, onward)    None       Subjective: Reports feeling much better today. Still has some lower quadrant soreness, but much improved  Objective: Vitals:   07/04/21 2322 07/05/21 0320 07/05/21 0500 07/05/21 1222  BP: (!) 133/56 (!) 122/55  123/66  Pulse: 64 (!) 58  (!) 59  Resp: 16 16  16   Temp: 97.7 F (36.5 C) 98.6 F (37 C)  98.5 F (36.9 C)  TempSrc: Oral Oral  Oral  SpO2: 98% 98%  97%  Weight:   84.4 kg   Height:        Intake/Output Summary (Last 24 hours) at 07/05/2021 1429 Last data filed at 07/05/2021 0600 Gross per 24 hour  Intake 1247.91 ml  Output --  Net 1247.91 ml   Filed Weights   07/04/21 1615 07/05/21 0500  Weight: 84.4 kg 84.4 kg    Examination: General exam: Awake, laying in bed, in nad Respiratory system: Normal respiratory effort, no wheezing Cardiovascular system: regular rate, s1, s2 Gastrointestinal system: Soft, nondistended, positive BS, mild lower quadrant tenderness without rebound Central nervous system: CN2-12 grossly intact, strength intact Extremities: Perfused, no clubbing Skin: Normal skin turgor, no notable skin lesions seen Psychiatry: Mood normal // no visual hallucinations   Data Reviewed: I have personally reviewed following labs and imaging studies  CBC: Recent Labs  Lab 07/04/21 1626 07/05/21 0021 07/05/21 0841  WBC 19.1* 14.2*  --   NEUTROABS  --  11.1*  --  HGB 15.1* 14.4 13.2  HCT 46.2* 44.7 39.6  MCV 84.3 86.6  --   PLT 201 224  --    Basic Metabolic Panel: Recent Labs  Lab 07/04/21 1724 07/05/21 0021  NA 137 141  K 4.0 3.7  CL 105 107  CO2 19* 25  GLUCOSE 122* 106*  BUN 12 10  CREATININE 0.80 0.89  CALCIUM 9.9 9.4   MG  --  1.8   GFR: Estimated Creatinine Clearance: 53 mL/min (by C-G formula based on SCr of 0.89 mg/dL). Liver Function Tests: Recent Labs  Lab 07/04/21 1724 07/05/21 0021  AST 21 19  ALT 10 17  ALKPHOS 82 81  BILITOT 0.7 0.9  PROT 7.7 7.7  ALBUMIN 4.6 4.2   Recent Labs  Lab 07/04/21 1724  LIPASE 17   No results for input(s): AMMONIA in the last 168 hours. Coagulation Profile: Recent Labs  Lab 07/05/21 0021  INR 1.1   Cardiac Enzymes: No results for input(s): CKTOTAL, CKMB, CKMBINDEX, TROPONINI in the last 168 hours. BNP (last 3 results) No results for input(s): PROBNP in the last 8760 hours. HbA1C: No results for input(s): HGBA1C in the last 72 hours. CBG: No results for input(s): GLUCAP in the last 168 hours. Lipid Profile: No results for input(s): CHOL, HDL, LDLCALC, TRIG, CHOLHDL, LDLDIRECT in the last 72 hours. Thyroid Function Tests: No results for input(s): TSH, T4TOTAL, FREET4, T3FREE, THYROIDAB in the last 72 hours. Anemia Panel: No results for input(s): VITAMINB12, FOLATE, FERRITIN, TIBC, IRON, RETICCTPCT in the last 72 hours. Sepsis Labs: No results for input(s): PROCALCITON, LATICACIDVEN in the last 168 hours.  Recent Results (from the past 240 hour(s))  Resp Panel by RT-PCR (Flu A&B, Covid) Nasopharyngeal Swab     Status: None   Collection Time: 07/04/21  6:49 PM   Specimen: Nasopharyngeal Swab; Nasopharyngeal(NP) swabs in vial transport medium  Result Value Ref Range Status   SARS Coronavirus 2 by RT PCR NEGATIVE NEGATIVE Final    Comment: (NOTE) SARS-CoV-2 target nucleic acids are NOT DETECTED.  The SARS-CoV-2 RNA is generally detectable in upper respiratory specimens during the acute phase of infection. The lowest concentration of SARS-CoV-2 viral copies this assay can detect is 138 copies/mL. A negative result does not preclude SARS-Cov-2 infection and should not be used as the sole basis for treatment or other patient management  decisions. A negative result may occur with  improper specimen collection/handling, submission of specimen other than nasopharyngeal swab, presence of viral mutation(s) within the areas targeted by this assay, and inadequate number of viral copies(<138 copies/mL). A negative result must be combined with clinical observations, patient history, and epidemiological information. The expected result is Negative.  Fact Sheet for Patients:  EntrepreneurPulse.com.au  Fact Sheet for Healthcare Providers:  IncredibleEmployment.be  This test is no t yet approved or cleared by the [place] FDA and  has been authorized for detection and/or diagnosis of SARS-CoV-2 by FDA under an Emergency Use Authorization (EUA). This EUA will remain  in effect (meaning this test can be used) for the duration of the COVID-19 declaration under Section 564(b)(1) of the Act, 21 U.S.C.section 360bbb-3(b)(1), unless the authorization is terminated  or revoked sooner.       Influenza A by PCR NEGATIVE NEGATIVE Final   Influenza B by PCR NEGATIVE NEGATIVE Final    Comment: (NOTE) The Xpert Xpress SARS-CoV-2/FLU/RSV plus assay is intended as an aid in the diagnosis of influenza from Nasopharyngeal swab specimens and should not be used as  a sole basis for treatment. Nasal washings and aspirates are unacceptable for Xpert Xpress SARS-CoV-2/FLU/RSV testing.  Fact Sheet for Patients: EntrepreneurPulse.com.au  Fact Sheet for Healthcare Providers: IncredibleEmployment.be  This test is not yet approved or cleared by the [place] FDA and has been authorized for detection and/or diagnosis of SARS-CoV-2 by FDA under an Emergency Use Authorization (EUA). This EUA will remain in effect (meaning this test can be used) for the duration of the COVID-19 declaration under Section 564(b)(1) of the Act, 21 U.S.C. section 360bbb-3(b)(1), unless the  authorization is terminated or revoked.  Performed at [facility], [address], [place], [place] [address]      Radiology Studies: CT ABDOMEN PELVIS W CONTRAST  Result Date: 07/04/2021 CLINICAL DATA:  Abdominal pain and chills for 1 night, history of recent bloody stool, initial encounter EXAM: CT ABDOMEN AND PELVIS WITH CONTRAST TECHNIQUE: Multidetector CT imaging of the abdomen and pelvis was performed using the standard protocol following bolus administration of intravenous contrast. CONTRAST:  151mL OMNIPAQUE IOHEXOL 300 MG/ML  SOLN COMPARISON:  None. FINDINGS: Lower chest: No acute abnormality. Hepatobiliary: No focal liver abnormality is seen. No gallstones, gallbladder wall thickening, or biliary dilatation. Pancreas: Unremarkable. No pancreatic ductal dilatation or surrounding inflammatory changes. Spleen: Normal in size without focal abnormality. Adrenals/Urinary Tract: Adrenal glands are within normal limits. Right kidney is well visualized with small cysts. No renal calculi or urinary tract obstructive changes are noted. 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 Cystic changes are noted in the left kidney. No renal calculi or obstructive changes are noted. Bladder is partially distended. Stomach/Bowel: Colon demonstrates significant diverticular change without evidence of diverticulitis. More proximal colon is decompressed. The appendix is within normal limits. Small bowel and stomach are unremarkable. Vascular/Lymphatic: Aortic atherosclerosis. No enlarged abdominal or pelvic lymph nodes. Reproductive: Status post hysterectomy. No adnexal masses. Other: No abdominal wall hernia or abnormality. No abdominopelvic ascites. Musculoskeletal: Degenerative changes of lumbar spine are noted. IMPRESSION: Diverticulosis without diverticulitis. No obstructive or inflammatory changes are noted. Ptotic left kidney as described without  complicating factors. No other focal abnormality is noted. Electronically Signed   By: Inez Catalina M.D.   On: 07/04/2021 19:19    Scheduled Meds:  pantoprazole  40 mg Oral Daily   Continuous Infusions:   LOS: 0 days   Marylu Lund, MD [facility] Pager On Amion  If 7PM-7AM, please contact night-coverage 07/05/2021, 2:29 PM

## 2021-07-06 DIAGNOSIS — K922 Gastrointestinal hemorrhage, unspecified: Secondary | ICD-10-CM | POA: Diagnosis not present

## 2021-07-06 DIAGNOSIS — I10 Essential (primary) hypertension: Secondary | ICD-10-CM | POA: Diagnosis not present

## 2021-07-06 LAB — CBC
HCT: 40 % (ref 36.0–46.0)
Hemoglobin: 13 g/dL (ref 12.0–15.0)
MCH: 28.4 pg (ref 26.0–34.0)
MCHC: 32.5 g/dL (ref 30.0–36.0)
MCV: 87.5 fL (ref 80.0–100.0)
Platelets: 174 10*3/uL (ref 150–400)
RBC: 4.57 MIL/uL (ref 3.87–5.11)
RDW: 14.7 % (ref 11.5–15.5)
WBC: 9.9 10*3/uL (ref 4.0–10.5)
nRBC: 0 % (ref 0.0–0.2)

## 2021-07-06 LAB — COMPREHENSIVE METABOLIC PANEL
ALT: 14 U/L (ref 0–44)
AST: 18 U/L (ref 15–41)
Albumin: 3.6 g/dL (ref 3.5–5.0)
Alkaline Phosphatase: 66 U/L (ref 38–126)
Anion gap: 8 (ref 5–15)
BUN: 16 mg/dL (ref 8–23)
CO2: 25 mmol/L (ref 22–32)
Calcium: 8.9 mg/dL (ref 8.9–10.3)
Chloride: 106 mmol/L (ref 98–111)
Creatinine, Ser: 1.03 mg/dL — ABNORMAL HIGH (ref 0.44–1.00)
GFR, Estimated: 55 mL/min — ABNORMAL LOW (ref >60)
Glucose, Bld: 96 mg/dL (ref 70–99)
Potassium: 3.5 mmol/L (ref 3.5–5.1)
Sodium: 139 mmol/L (ref 135–145)
Total Bilirubin: 0.9 mg/dL (ref 0.3–1.2)
Total Protein: 6.3 g/dL — ABNORMAL LOW (ref 6.5–8.1)

## 2021-07-06 MED ORDER — DOCUSATE SODIUM 100 MG PO CAPS: 100.0000 mg | ORAL_CAPSULE | Freq: Two times a day (BID) | ORAL | 0 refills | Status: DC

## 2021-07-06 MED ORDER — BISACODYL 10 MG RE SUPP
10.0000 mg | Freq: Once | RECTAL | Status: AC
  Administered 2021-07-06: 10 mg via RECTAL
  Filled 2021-07-06: qty 1

## 2021-07-06 MED ORDER — POLYETHYLENE GLYCOL 3350 17 G PO PACK: 17.0000 g | PACK | Freq: Every day | ORAL | 0 refills | Status: DC

## 2021-07-06 NOTE — Progress Notes (Signed)
AVS given to patient and explained at the bedside. Medications and follow up appointments have been explained with pt verbalizing understanding.  

## 2021-07-06 NOTE — Discharge Summary (Signed)
Physician Discharge Summary  Cristina Sawyer GHW:[id number] DOB: Nov 07, 1940 DOA: 07/04/2021  PCP: Denita Lung, MD  Admit date: 07/04/2021 Discharge date: 07/06/2021  Admitted From: Home Disposition:  Home  Recommendations for Outpatient Follow-up:  Follow up with PCP in 1-2 weeks  Discharge Condition:Improved CODE STATUS:DNR Diet recommendation: Regular   Brief/Interim Summary: 81 y.o. female with medical history significant for hypertension, diverticulosis, generalized anxiety disorder, hyperlipidemia who is admitted to [facility] on 07/04/2021 by way of transfer from [facility] emergency department with suspected acute upper gastrointestinal bleed after presenting from home to the latter facility complaining of abdominal pain with bloody stool, hemaccult noted to be pos. Initial concerns for UGI bleeding. On further questioning, symptoms began 8-12hrs after eating questionable foods (either fried okra from [employer] or home made beans) the night before onset of symptoms. Patient reported fried chicken restaurant appeared dirty and not sanitary  Discharge Diagnoses:  Principal Problem:   Acute upper GI bleed Active Problems:   Essential hypertension   Hyperlipidemia   Leukocytosis   Abdominal pain   GAD (generalized anxiety disorder)  Likely infectious gastroenteritis -onset of symptoms appeared to correspond to eating of questionable food items -Reported lower quadrant abd discomfort and cramping, no epigastric pain. No hematemesis or vomiting coffee ground type emesis -Hgb has remained stable thus far -Rapid improvement overnight since presentation -Successfully advanced diet -Discussed case with GI who agrees with likely infectious gastroenteritis. Would recommend close GI f/u as outpt when discharged   Leukocytosis -Suspect secondary to above gastroenteritis that is now improving -WBC trend improved   HTN -BP stable and controlled -cont home  meds on d/c    GAD -on PRN xanax prior to admit   Hyperlipidemia:  -On rosuvastatin as an outpatient, will continue    Discharge Instructions   Allergies as of 07/06/2021       Reactions   Codeine Itching, Nausea Only   Ciprofloxacin Hcl Rash        Medication List     STOP taking these medications    amoxicillin-clavulanate 875-125 MG tablet Commonly known as: AUGMENTIN   azithromycin 500 MG tablet Commonly known as: Zithromax   betamethasone dipropionate 0.05 % ointment Commonly known as: DIPROLENE   clotrimazole-betamethasone cream Commonly known as: Lotrisone   triamcinolone cream 0.1 % Commonly known as: KENALOG       TAKE these medications    ALPRAZolam 0.25 MG tablet Commonly known as: XANAX Take 1 tablet (0.25 mg total) by mouth 2 (two) times daily as needed for anxiety.   amLODipine 10 MG tablet Commonly known as: NORVASC TAKE 1 TABLET BY MOUTH EVERY DAY   aspirin EC 81 MG tablet Take 81 mg by mouth daily.   cetirizine 10 MG tablet Commonly known as: ZYRTEC Take 10 mg by mouth daily as needed for allergies.   docusate sodium 100 MG capsule Commonly known as: Colace Take 1 capsule (100 mg total) by mouth 2 (two) times daily.   polyethylene glycol 17 g packet Commonly known as: MiraLax Take 17 g by mouth daily.   rosuvastatin 5 MG tablet Commonly known as: CRESTOR TAKE 1 TABLET BY MOUTH EVERY DAY   VITAMIN D PO Take 2,000 Units by mouth daily at 12 noon.        Follow-up Information     Denita Lung, MD Follow up in 2 week(s).   Specialty: Family Medicine Why: Hospital follow up Contact information: [address] [place] [address] [phone]  Sueanne Margarita, MD .   Specialty: Cardiology Contact information: [address] [place] [address] [phone]         Kelvin Cellar, MD Follow up.   Specialty: Internal Medicine Why: Hospital follow up Contact  information: [place] [address] [phone]                Allergies  Allergen Reactions   Codeine Itching and Nausea Only   Ciprofloxacin Hcl Rash    Consultations: Discussed with  GI over phone  Procedures/Studies: CT ABDOMEN PELVIS W CONTRAST  Result Date: 07/04/2021 CLINICAL DATA:  Abdominal pain and chills for 1 night, history of recent bloody stool, initial encounter EXAM: CT ABDOMEN AND PELVIS WITH CONTRAST TECHNIQUE: Multidetector CT imaging of the abdomen and pelvis was performed using the standard protocol following bolus administration of intravenous contrast. CONTRAST:  178mL OMNIPAQUE IOHEXOL 300 MG/ML  SOLN COMPARISON:  None. FINDINGS: Lower chest: No acute abnormality. Hepatobiliary: No focal liver abnormality is seen. No gallstones, gallbladder wall thickening, or biliary dilatation. Pancreas: Unremarkable. No pancreatic ductal dilatation or surrounding inflammatory changes. Spleen: Normal in size without focal abnormality. Adrenals/Urinary Tract: Adrenal glands are within normal limits. Right kidney is well visualized with small cysts. No renal calculi or urinary tract obstructive changes are noted. 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 Cystic changes are noted in the left kidney. No renal calculi or obstructive changes are noted. Bladder is partially distended. Stomach/Bowel: Colon demonstrates significant diverticular change without evidence of diverticulitis. More proximal colon is decompressed. The appendix is within normal limits. Small bowel and stomach are unremarkable. Vascular/Lymphatic: Aortic atherosclerosis. No enlarged abdominal or pelvic lymph nodes. Reproductive: Status post hysterectomy. No adnexal masses. Other: No abdominal wall hernia or abnormality. No abdominopelvic ascites. Musculoskeletal: Degenerative changes of lumbar spine are noted. IMPRESSION: Diverticulosis without  diverticulitis. No obstructive or inflammatory changes are noted. Ptotic left kidney as described without complicating factors. No other focal abnormality is noted. Electronically Signed   By: Inez Catalina M.D.   On: 07/04/2021 19:19    Subjective: Eager to go home  Discharge Exam: Vitals:   07/06/21 0512 07/06/21 1222  BP: (!) 114/51 (!) 116/54  Pulse: 67 65  Resp: 20 18  Temp: 98.3 F (36.8 C) 98.4 F (36.9 C)  SpO2: 97% 98%   Vitals:   07/05/21 1222 07/05/21 1952 07/06/21 0512 07/06/21 1222  BP: 123/66 (!) 109/58 (!) 114/51 (!) 116/54  Pulse: (!) 59 63 67 65  Resp: 16 14 20 18   Temp: 98.5 F (36.9 C) 98.8 F (37.1 C) 98.3 F (36.8 C) 98.4 F (36.9 C)  TempSrc: Oral Oral Oral Oral  SpO2: 97% 96% 97% 98%  Weight:      Height:        General: Pt is alert, awake, not in acute distress Cardiovascular: RRR, S1/S2 + Respiratory: CTA bilaterally, no wheezing, no rhonchi Abdominal: Soft, NT, ND, bowel sounds + Extremities: no edema, no cyanosis   The results of significant diagnostics from this hospitalization (including imaging, microbiology, ancillary and laboratory) are listed below for reference.     Microbiology: Recent Results (from the past 240 hour(s))  Resp Panel by RT-PCR (Flu A&B, Covid) Nasopharyngeal Swab     Status: None   Collection Time: 07/04/21  6:49 PM   Specimen: Nasopharyngeal Swab; Nasopharyngeal(NP) swabs in vial transport medium  Result Value Ref Range Status   SARS Coronavirus 2 by  RT PCR NEGATIVE NEGATIVE Final    Comment: (NOTE) SARS-CoV-2 target nucleic acids are NOT DETECTED.  The SARS-CoV-2 RNA is generally detectable in upper respiratory specimens during the acute phase of infection. The lowest concentration of SARS-CoV-2 viral copies this assay can detect is 138 copies/mL. A negative result does not preclude SARS-Cov-2 infection and should not be used as the sole basis for treatment or other patient management decisions. A negative  result may occur with  improper specimen collection/handling, submission of specimen other than nasopharyngeal swab, presence of viral mutation(s) within the areas targeted by this assay, and inadequate number of viral copies(<138 copies/mL). A negative result must be combined with clinical observations, patient history, and epidemiological information. The expected result is Negative.  Fact Sheet for Patients:  EntrepreneurPulse.com.au  Fact Sheet for Healthcare Providers:  IncredibleEmployment.be  This test is no t yet approved or cleared by the [place] FDA and  has been authorized for detection and/or diagnosis of SARS-CoV-2 by FDA under an Emergency Use Authorization (EUA). This EUA will remain  in effect (meaning this test can be used) for the duration of the COVID-19 declaration under Section 564(b)(1) of the Act, 21 U.S.C.section 360bbb-3(b)(1), unless the authorization is terminated  or revoked sooner.       Influenza A by PCR NEGATIVE NEGATIVE Final   Influenza B by PCR NEGATIVE NEGATIVE Final    Comment: (NOTE) The Xpert Xpress SARS-CoV-2/FLU/RSV plus assay is intended as an aid in the diagnosis of influenza from Nasopharyngeal swab specimens and should not be used as a sole basis for treatment. Nasal washings and aspirates are unacceptable for Xpert Xpress SARS-CoV-2/FLU/RSV testing.  Fact Sheet for Patients: EntrepreneurPulse.com.au  Fact Sheet for Healthcare Providers: IncredibleEmployment.be  This test is not yet approved or cleared by the [place] FDA and has been authorized for detection and/or diagnosis of SARS-CoV-2 by FDA under an Emergency Use Authorization (EUA). This EUA will remain in effect (meaning this test can be used) for the duration of the COVID-19 declaration under Section 564(b)(1) of the Act, 21 U.S.C. section 360bbb-3(b)(1), unless the authorization is  terminated or revoked.  Performed at [facility], [address] [place], [place] [address]      Labs: BNP (last 3 results) No results for input(s): BNP in the last 8760 hours. Basic Metabolic Panel: Recent Labs  Lab 07/04/21 1724 07/05/21 0021 07/06/21 0334  NA 137 141 139  K 4.0 3.7 3.5  CL 105 107 106  CO2 19* 25 25  GLUCOSE 122* 106* 96  BUN 12 10 16   CREATININE 0.80 0.89 1.03*  CALCIUM 9.9 9.4 8.9  MG  --  1.8  --    Liver Function Tests: Recent Labs  Lab 07/04/21 1724 07/05/21 0021 07/06/21 0334  AST 21 19 18   ALT 10 17 14   ALKPHOS 82 81 66  BILITOT 0.7 0.9 0.9  PROT 7.7 7.7 6.3*  ALBUMIN 4.6 4.2 3.6   Recent Labs  Lab 07/04/21 1724  LIPASE 17   No results for input(s): AMMONIA in the last 168 hours. CBC: Recent Labs  Lab 07/04/21 1626 07/05/21 0021 07/05/21 0841 07/06/21 0334  WBC 19.1* 14.2*  --  9.9  NEUTROABS  --  11.1*  --   --   HGB 15.1* 14.4 13.2 13.0  HCT 46.2* 44.7 39.6 40.0  MCV 84.3 86.6  --  87.5  PLT 201 224  --  174   Cardiac Enzymes: No results for input(s): CKTOTAL, CKMB, CKMBINDEX,  TROPONINI in the last 168 hours. BNP: Invalid input(s): POCBNP CBG: No results for input(s): GLUCAP in the last 168 hours. D-Dimer No results for input(s): DDIMER in the last 72 hours. Hgb A1c No results for input(s): HGBA1C in the last 72 hours. Lipid Profile No results for input(s): CHOL, HDL, LDLCALC, TRIG, CHOLHDL, LDLDIRECT in the last 72 hours. Thyroid function studies No results for input(s): TSH, T4TOTAL, T3FREE, THYROIDAB in the last 72 hours.  Invalid input(s): FREET3 Anemia work up No results for input(s): VITAMINB12, FOLATE, FERRITIN, TIBC, IRON, RETICCTPCT in the last 72 hours. Urinalysis    Component Value Date/Time   COLORURINE YELLOW 07/04/2021 [place] 07/04/2021 1724   LABSPEC 1.018 07/04/2021 1724   LABSPEC 1.020 08/09/2019 1321   PHURINE 6.0 07/04/2021 1724   GLUCOSEU  NEGATIVE 07/04/2021 1724   HGBUR SMALL (A) 07/04/2021 1724   BILIRUBINUR NEGATIVE 07/04/2021 1724   BILIRUBINUR small (A) 08/09/2019 1321   BILIRUBINUR n 04/22/2016 1558   KETONESUR 15 (A) 07/04/2021 1724   PROTEINUR 30 (A) 07/04/2021 1724   UROBILINOGEN negative 04/22/2016 1558   UROBILINOGEN 0.2 03/04/2011 1342   NITRITE NEGATIVE 07/04/2021 1724   LEUKOCYTESUR NEGATIVE 07/04/2021 1724   Sepsis Labs Invalid input(s): PROCALCITONIN,  WBC,  LACTICIDVEN Microbiology Recent Results (from the past 240 hour(s))  Resp Panel by RT-PCR (Flu A&B, Covid) Nasopharyngeal Swab     Status: None   Collection Time: 07/04/21  6:49 PM   Specimen: Nasopharyngeal Swab; Nasopharyngeal(NP) swabs in vial transport medium  Result Value Ref Range Status   SARS Coronavirus 2 by RT PCR NEGATIVE NEGATIVE Final    Comment: (NOTE) SARS-CoV-2 target nucleic acids are NOT DETECTED.  The SARS-CoV-2 RNA is generally detectable in upper respiratory specimens during the acute phase of infection. The lowest concentration of SARS-CoV-2 viral copies this assay can detect is 138 copies/mL. A negative result does not preclude SARS-Cov-2 infection and should not be used as the sole basis for treatment or other patient management decisions. A negative result may occur with  improper specimen collection/handling, submission of specimen other than nasopharyngeal swab, presence of viral mutation(s) within the areas targeted by this assay, and inadequate number of viral copies(<138 copies/mL). A negative result must be combined with clinical observations, patient history, and epidemiological information. The expected result is Negative.  Fact Sheet for Patients:  EntrepreneurPulse.com.au  Fact Sheet for Healthcare Providers:  IncredibleEmployment.be  This test is no t yet approved or cleared by the [place] FDA and  has been authorized for detection and/or diagnosis of SARS-CoV-2  by FDA under an Emergency Use Authorization (EUA). This EUA will remain  in effect (meaning this test can be used) for the duration of the COVID-19 declaration under Section 564(b)(1) of the Act, 21 U.S.C.section 360bbb-3(b)(1), unless the authorization is terminated  or revoked sooner.       Influenza A by PCR NEGATIVE NEGATIVE Final   Influenza B by PCR NEGATIVE NEGATIVE Final    Comment: (NOTE) The Xpert Xpress SARS-CoV-2/FLU/RSV plus assay is intended as an aid in the diagnosis of influenza from Nasopharyngeal swab specimens and should not be used as a sole basis for treatment. Nasal washings and aspirates are unacceptable for Xpert Xpress SARS-CoV-2/FLU/RSV testing.  Fact Sheet for Patients: EntrepreneurPulse.com.au  Fact Sheet for Healthcare Providers: IncredibleEmployment.be  This test is not yet approved or cleared by the [place] FDA and has been authorized for detection and/or diagnosis of SARS-CoV-2 by FDA under an Emergency Use Authorization (  EUA). This EUA will remain in effect (meaning this test can be used) for the duration of the COVID-19 declaration under Section 564(b)(1) of the Act, 21 U.S.C. section 360bbb-3(b)(1), unless the authorization is terminated or revoked.  Performed at [facility], [address] [place], [place] [address]    Time spent: 20min  SIGNED:   Marylu Lund, MD  [facility] 07/06/2021, 8:11 PM  If 7PM-7AM, please contact night-coverage

## 2021-07-09 ENCOUNTER — Telehealth: Payer: Self-pay

## 2021-07-09 NOTE — Telephone Encounter (Signed)
Transition Care Management Follow-up Telephone Call Date of discharge and from where: Cristina Sawyer 07/06/21 How have you been since you were released from the hospital? [place] Any questions or concerns? No  Items Reviewed: Did the pt receive and understand the discharge instructions provided? Yes  Medications obtained and verified? Yes  Other? No  Any new allergies since your discharge? No  Dietary orders reviewed? No Do you have support at home? Yes   Home Care and Equipment/Supplies: Were home health services ordered? no  Functional Questionnaire: (I = Independent and D = Dependent) ADLs: I  Bathing/Dressing- I  Meal Prep- I  Eating- I  Maintaining continence- I  Transferring/Ambulation- I  Managing Meds- I  Follow up appointments reviewed:  PCP Hospital f/u appt confirmed? Yes  Scheduled to see Dr. Redmond School on 07/12/21 @ 11:15. [place] [facility] f/u appt confirmed? No   Are transportation arrangements needed? No  If their condition worsens, is the pt aware to call PCP or go to the Emergency Dept.? Yes Was the patient provided with contact information for the PCP's office or ED? Yes Was to pt encouraged to call back with questions or concerns? Yes

## 2021-07-12 ENCOUNTER — Encounter: Payer: Self-pay | Admitting: Family Medicine

## 2021-07-12 ENCOUNTER — Other Ambulatory Visit: Payer: Self-pay

## 2021-07-12 ENCOUNTER — Ambulatory Visit (INDEPENDENT_AMBULATORY_CARE_PROVIDER_SITE_OTHER): Payer: Medicare HMO | Admitting: Family Medicine

## 2021-07-12 VITALS — BP 124/72 | HR 92 | Temp 97.7°F | Wt 182.2 lb

## 2021-07-12 DIAGNOSIS — I700 Atherosclerosis of aorta: Secondary | ICD-10-CM

## 2021-07-12 DIAGNOSIS — Z23 Encounter for immunization: Secondary | ICD-10-CM

## 2021-07-12 DIAGNOSIS — K5790 Diverticulosis of intestine, part unspecified, without perforation or abscess without bleeding: Secondary | ICD-10-CM | POA: Insufficient documentation

## 2021-07-12 DIAGNOSIS — R109 Unspecified abdominal pain: Secondary | ICD-10-CM

## 2021-07-12 NOTE — Progress Notes (Signed)
   Subjective:    Patient ID: Cristina Sawyer, female    DOB: October 05, 1940, 80 y.o.   MRN: [id number]  HPI She is here for a follow-up visit after recent hospitalization and treatment for abdominal pain.  She was initially diagnosed with an upper GI bleed however she stated that she did note some bright red blood per rectum.  Apparently her stool was positive.  She responded very nicely to conservative care.  She does have a previous history of tubular adenoma however because of her age further follow-up was not done except for Cologuard which was negative.  She complains of still having difficulty with full and bloated feeling in her abdomen and difficulty with having BMs.  She is taking MiraLAX but states that this has not been very successful.  She was told to take mag citrate but has not been able to find any.   Review of Systems     Objective:   Physical Exam Alert and in no distress.  Cardiac exam shows regular rhythm without murmurs gallops.  Lungs clear to auscultation.  Abdominal exam shows no masses or tenderness. The emergency room record and discharge summary including lab and x-rays was reevaluated.  Evidence of diverticulosis as well as aortic atherosclerosis was noted on x-ray.       Assessment & Plan:  Abdominal pain, unspecified abdominal location - Plan: Ambulatory referral to Gastroenterology  Need for influenza vaccination - Plan: Flu Vaccine QUAD High Dose(Fluad)  Diverticulosis  Aortic atherosclerosis ([place]) I think GI referral was appropriate since she is still having difficulty. She is on a statin for the aortic atherosclerosis.  No therapy for the diverticulosis at the present time. Also she is in need of a Tdap as well as Shingrix which she plans to do at the drugstore.

## 2021-07-23 ENCOUNTER — Telehealth: Payer: Self-pay

## 2021-07-23 NOTE — Telephone Encounter (Signed)
Patient called to report she is seeing GI provider this Thursday, nothing further needed at this time

## 2021-07-26 ENCOUNTER — Encounter: Payer: Self-pay | Admitting: Physician Assistant

## 2021-07-26 ENCOUNTER — Ambulatory Visit: Payer: Medicare HMO | Admitting: Physician Assistant

## 2021-07-26 VITALS — BP 128/77 | HR 76 | Ht 64.0 in | Wt 181.2 lb

## 2021-07-26 DIAGNOSIS — R140 Abdominal distension (gaseous): Secondary | ICD-10-CM

## 2021-07-26 DIAGNOSIS — K219 Gastro-esophageal reflux disease without esophagitis: Secondary | ICD-10-CM | POA: Diagnosis not present

## 2021-07-26 DIAGNOSIS — R1030 Lower abdominal pain, unspecified: Secondary | ICD-10-CM | POA: Diagnosis not present

## 2021-07-26 MED ORDER — PANTOPRAZOLE SODIUM 40 MG PO TBEC: 40.0000 mg | DELAYED_RELEASE_TABLET | Freq: Every day | ORAL | 2 refills | Status: DC

## 2021-07-26 MED ORDER — DICYCLOMINE HCL 10 MG PO CAPS: 10.0000 mg | ORAL_CAPSULE | Freq: Three times a day (TID) | ORAL | 1 refills | Status: DC

## 2021-07-26 NOTE — Patient Instructions (Signed)
We have sent the following medications to your pharmacy for you to pick up at your convenience: Dicyclomine 10 mg four times daily 20-30 minutes before meals and at bedtime. Pantoprazole 40 mg daily 30-60 minutes before breakfast.  If you are age 80 or older, your body mass index should be between 23-30. Your Body mass index is 31.1 kg/m. If this is out of the aforementioned range listed, please consider follow up with your Primary Care Provider.  If you are age 9 or younger, your body mass index should be between 19-25. Your Body mass index is 31.1 kg/m. If this is out of the aformentioned range listed, please consider follow up with your Primary Care Provider.   ________________________________________________________  The [facility] providers would like to encourage you to use [facility] to communicate with providers for non-urgent requests or questions.  Due to long hold times on the telephone, sending your provider a message by [facility] may be a faster and more efficient way to get a response.  Please allow 48 business hours for a response.  Please remember that this is for non-urgent requests.  _______________________________________________________

## 2021-07-26 NOTE — Progress Notes (Signed)
Chief Complaint: Abdominal pain, bloating and gas  HPI:    Cristina Sawyer is an 80 year old female with a past medical history of reflux and others listed below, known to Dr. Carlean Purl, who was referred to me by Denita Lung, MD for a complaint of abdominal pain, bloating and gas.    03/15/2014 colonoscopy for personal history of adenomatous polyps with a sessile polyp measuring 6 mm in the transverse colon, severe diverticulosis in the sigmoid colon and otherwise normal.  Pathology showed adenoma.  Discussed possibility of repeat in 5 years.    07/04/2021-07/06/2021 patient was admitted to [facility] for suspected acute upper GI bleed after presenting from home complaining of abdominal pain and bloody school.  Diagnosed with infectious gastroenteritis after eating "questionable food".  White count was elevated patient improved dramatically and was discharged home.  CTAP with contrast showed diverticulosis without diverticulitis.    07/06/2021 normal CBC.  CMP with minimally elevated creatinine at 1.03.    07/12/2021 patient seen in clinic by her PCP.  Noted a Cologuard negative in 2020.  At that time discussed feeling full and bloated with difficulty having bowel movements.    Today, the patient presents to clinic and tells me that about 3 weeks ago she had dinner out and ate some "questionable food", after that developed abdominal pain and nausea with vomiting as well as had a bowel movement with some blood and ended up in the hospital as above.  Since that time she has continued with some of her symptoms but in general feels 75% better.  Initially was using MiraLAX/Colace daily but this led to multiple loose/watery stools so she has discontinued this and now has a soft bowel movement at least once a day but it is "not a lot".  Tells me her main symptom is that she feels a continued lower abdominal "swollen feeling", and tenderness to palpation.  She describes this as dull and rated as a  5-6/10.  She has been eating mostly soft foods to try to avoid excess gas which she seems to have a lot of right now from "both ends".  Also describes reflux since presentation of symptoms.    Does describe being on a lot of Aleve about 4 to 5 weeks ago for some knee trouble.  She has since discontinued this.    He denies fever, chills, weight loss, further blood in her stool, further nausea or vomiting or symptoms that awaken her from sleep.  Past Medical History:  Diagnosis Date   Arthritis    Cataract    Diverticulosis    Dyslipidemia    GERD (gastroesophageal reflux disease)    HTN (hypertension) 2015   Personal history of colonic adenoma 08/09/2008   Vitamin D deficiency     Past Surgical History:  Procedure Laterality Date   ABDOMINAL HYSTERECTOMY  1975   APPENDECTOMY  1975   COLONOSCOPY     HAND SURGERY     Cyst removal   THYROID SURGERY  2000   to remove nodule    Current Outpatient Medications  Medication Sig Dispense Refill   amLODipine (NORVASC) 10 MG tablet TAKE 1 TABLET BY MOUTH EVERY DAY (Patient taking differently: Take 10 mg by mouth daily.) 90 tablet 3   aspirin EC 81 MG tablet Take 81 mg by mouth daily.     cetirizine (ZYRTEC) 10 MG tablet Take 10 mg by mouth daily as needed for allergies.     rosuvastatin (CRESTOR) 5 MG tablet TAKE  1 TABLET BY MOUTH EVERY DAY (Patient taking differently: Take 5 mg by mouth daily.) 90 tablet 3   VITAMIN D PO Take 2,000 Units by mouth daily at 12 noon.     ALPRAZolam (XANAX) 0.25 MG tablet Take 1 tablet (0.25 mg total) by mouth 2 (two) times daily as needed for anxiety. 20 tablet 0   No current facility-administered medications for this visit.    Allergies as of 07/26/2021 - Review Complete 07/26/2021  Allergen Reaction Noted   Codeine Itching and Nausea Only 07/26/2008   Ciprofloxacin hcl Rash 06/20/2011    Family History  Problem Relation Age of Onset   Heart disease Sister    Heart disease Sister        in her 67s    Colon cancer Neg Hx     Social History   Socioeconomic History   Marital status: Widowed    Spouse name: Not on file   Number of children: Not on file   Years of education: Not on file   Highest education level: Not on file  Occupational History   Occupation: Retired from Insurance underwriter and [employer]  Tobacco Use   Smoking status: Never   Smokeless tobacco: Never  Vaping Use   Vaping Use: Never used  Substance and Sexual Activity   Alcohol use: Yes    Alcohol/week: 1.0 standard drink    Types: 1 Glasses of wine per week   Drug use: No   Sexual activity: Yes  Other Topics Concern   Not on file  Social History Narrative   Pt lives alone.    Social Determinants of Health   Financial Resource Strain: Not on file  Food Insecurity: Not on file  Transportation Needs: Not on file  Physical Activity: Not on file  Stress: Not on file  Social Connections: Not on file  Intimate Partner Violence: Not on file    Review of Systems:    Constitutional: No weight loss, fever or chills Skin: No rash Cardiovascular: No chest pains   Respiratory: No SOB  Gastrointestinal: See HPI and otherwise negative Genitourinary: No dysuria Neurological: No headache, dizziness or syncope Musculoskeletal: No new muscle or joint pain Hematologic: No bruising Psychiatric: No history of depression or anxiety   Physical Exam:  Vital signs: BP 128/77   Pulse 76   Ht 5\' 4"  (1.626 m)   Wt 181 lb 3.2 oz (82.2 kg)   SpO2 98%   BMI 31.10 kg/m    Constitutional:   Pleasant AA female appears to be in NAD, Well developed, Well nourished, alert and cooperative Head:  Normocephalic and atraumatic. Eyes:   PEERL, EOMI. No icterus. Conjunctiva pink. Ears:  Normal auditory acuity. Neck:  Supple Throat: Oral cavity and pharynx without inflammation, swelling or lesion.  Respiratory: Respirations even and unlabored. Lungs clear to auscultation bilaterally.   No wheezes, crackles, or rhonchi.  Cardiovascular:  Normal S1, S2. No MRG. Regular rate and rhythm. No peripheral edema, cyanosis or pallor.  Gastrointestinal:  Soft, nondistended, moderate bilateral lower TTP with some involuntary guarding. Normal bowel sounds. No appreciable masses or hepatomegaly. Rectal:  Not performed.  Msk:  Symmetrical without gross deformities. Without edema, no deformity or joint abnormality.  Neurologic:  Alert and  oriented x4;  grossly normal neurologically.  Skin:   Dry and intact without significant lesions or rashes. Psychiatric: Demonstrates good judgement and reason without abnormal affect or behaviors.  RELEVANT LABS AND IMAGING: CBC    Component Value Date/Time   WBC  9.9 07/06/2021 0334   RBC 4.57 07/06/2021 0334   HGB 13.0 07/06/2021 0334   HGB 14.9 08/02/2020 1055   HCT 40.0 07/06/2021 0334   HCT 45.3 08/02/2020 1055   PLT 174 07/06/2021 0334   PLT 244 08/02/2020 1055   MCV 87.5 07/06/2021 0334   MCV 86 08/02/2020 1055   MCH 28.4 07/06/2021 0334   MCHC 32.5 07/06/2021 0334   RDW 14.7 07/06/2021 0334   RDW 13.8 08/02/2020 1055   LYMPHSABS 2.2 07/05/2021 0021   LYMPHSABS 2.9 08/02/2020 1055   MONOABS 0.7 07/05/2021 0021   EOSABS 0.0 07/05/2021 0021   EOSABS 0.2 08/02/2020 1055   BASOSABS 0.0 07/05/2021 0021   BASOSABS 0.1 08/02/2020 1055    CMP     Component Value Date/Time   NA 139 07/06/2021 0334   NA 144 08/02/2020 1055   K 3.5 07/06/2021 0334   CL 106 07/06/2021 0334   CO2 25 07/06/2021 0334   GLUCOSE 96 07/06/2021 0334   BUN 16 07/06/2021 0334   BUN 18 08/02/2020 1055   CREATININE 1.03 (H) 07/06/2021 0334   CREATININE 0.99 (H) 05/12/2017 1313   CALCIUM 8.9 07/06/2021 0334   PROT 6.3 (L) 07/06/2021 0334   PROT 7.5 08/02/2020 1055   ALBUMIN 3.6 07/06/2021 0334   ALBUMIN 4.8 (H) 08/02/2020 1055   AST 18 07/06/2021 0334   ALT 14 07/06/2021 0334   ALKPHOS 66 07/06/2021 0334   BILITOT 0.9 07/06/2021 0334   BILITOT 0.8 08/02/2020 1055   GFRNONAA 55 (L) 07/06/2021 0334   GFRAA  56 (L) 08/02/2020 1055    Assessment: 1.  Lower abdominal pain: Lingering abdominal pain, though this is 75% better; likely still from infectious gastroenteritis 2.  Change in bowel habits: Towards smaller and looser, she has changed her diet over the past 2 weeks since developing symptoms; consider postinfectious IBS 3.  Heartburn/reflux and gas: With above, likely lingering gastritis/reactive gastritis  Plan: 1.  Discussed with patient that I do believe she had infectious/viral gastroenteritis after eating some bad food.  It sounds like she is 75% better now and this is encouraging.  She does continue with some lingering symptoms which are worrying her.  Explained that I am happy with her progress. Tried to reassure her. 2.  Started the patient on Dicyclomine 10 mg 4 times daily, 20 to 30 minutes before meals and at bedtime #120 with 2 refills.  Discussed that at first she should take this 4 times a day with her lingering abdominal pain, but then should titrate down and just use as needed for ongoing pain over the next 1-2 weeks 3.  Prescribed Pantoprazole 40 mg once daily, 30-60 minutes before breakfast #30 with 2 refills.  Discussed that she should take this once daily for the next month and then titrate down/off 4.  Discussed with patient that if she continues with symptoms at time of follow-up in 4 to 6 weeks with me then we will consider colonoscopy.  Ellouise Newer, PA-C [facility] 07/26/2021, 10:03 AM  Cc: Denita Lung, MD

## 2021-07-31 ENCOUNTER — Other Ambulatory Visit: Payer: Self-pay | Admitting: Family Medicine

## 2021-07-31 DIAGNOSIS — Z8673 Personal history of transient ischemic attack (TIA), and cerebral infarction without residual deficits: Secondary | ICD-10-CM

## 2021-08-07 ENCOUNTER — Ambulatory Visit (INDEPENDENT_AMBULATORY_CARE_PROVIDER_SITE_OTHER): Payer: Medicare HMO | Admitting: Family Medicine

## 2021-08-07 ENCOUNTER — Other Ambulatory Visit: Payer: Self-pay

## 2021-08-07 ENCOUNTER — Encounter: Payer: Self-pay | Admitting: Family Medicine

## 2021-08-07 VITALS — BP 126/78 | HR 73 | Temp 97.0°F | Ht 63.0 in | Wt 180.8 lb

## 2021-08-07 DIAGNOSIS — I700 Atherosclerosis of aorta: Secondary | ICD-10-CM

## 2021-08-07 DIAGNOSIS — Z8639 Personal history of other endocrine, nutritional and metabolic disease: Secondary | ICD-10-CM | POA: Diagnosis not present

## 2021-08-07 DIAGNOSIS — K219 Gastro-esophageal reflux disease without esophagitis: Secondary | ICD-10-CM

## 2021-08-07 DIAGNOSIS — H259 Unspecified age-related cataract: Secondary | ICD-10-CM | POA: Diagnosis not present

## 2021-08-07 DIAGNOSIS — N1831 Chronic kidney disease, stage 3a: Secondary | ICD-10-CM | POA: Diagnosis not present

## 2021-08-07 DIAGNOSIS — Z23 Encounter for immunization: Secondary | ICD-10-CM

## 2021-08-07 DIAGNOSIS — Z86010 Personal history of colonic polyps: Secondary | ICD-10-CM

## 2021-08-07 DIAGNOSIS — Z0000 Encounter for general adult medical examination without abnormal findings: Secondary | ICD-10-CM

## 2021-08-07 DIAGNOSIS — E785 Hyperlipidemia, unspecified: Secondary | ICD-10-CM

## 2021-08-07 DIAGNOSIS — I10 Essential (primary) hypertension: Secondary | ICD-10-CM | POA: Diagnosis not present

## 2021-08-07 DIAGNOSIS — J301 Allergic rhinitis due to pollen: Secondary | ICD-10-CM

## 2021-08-07 DIAGNOSIS — Z8673 Personal history of transient ischemic attack (TIA), and cerebral infarction without residual deficits: Secondary | ICD-10-CM

## 2021-08-07 DIAGNOSIS — G459 Transient cerebral ischemic attack, unspecified: Secondary | ICD-10-CM

## 2021-08-07 DIAGNOSIS — M8580 Other specified disorders of bone density and structure, unspecified site: Secondary | ICD-10-CM

## 2021-08-07 DIAGNOSIS — R69 Illness, unspecified: Secondary | ICD-10-CM | POA: Diagnosis not present

## 2021-08-07 DIAGNOSIS — E559 Vitamin D deficiency, unspecified: Secondary | ICD-10-CM | POA: Diagnosis not present

## 2021-08-07 DIAGNOSIS — F411 Generalized anxiety disorder: Secondary | ICD-10-CM

## 2021-08-07 MED ORDER — ROSUVASTATIN CALCIUM 5 MG PO TABS: 5.0000 mg | ORAL_TABLET | Freq: Every day | ORAL | 3 refills | Status: DC

## 2021-08-07 MED ORDER — AMLODIPINE BESYLATE 10 MG PO TABS: 10.0000 mg | ORAL_TABLET | Freq: Every day | ORAL | 3 refills | Status: DC

## 2021-08-07 NOTE — Progress Notes (Signed)
Cristina Sawyer is a 80 y.o. female who presents for annual wellness visit,CPE and follow-up on chronic medical conditions.  She was recently hospitalized and evaluated for abdominal pain.  She has responded very nicely to Bentyl and Protonix.  She does have a follow-up visit with gastroenterology concerning all this.  She also has a previous history of colonic polyp but this was several years ago.  He has had a Cologuard test which was negative.  Review of record also indicates previous history of osteopenia and and need for follow-up DEXA scan.  She has had some minimal difficulty with back pain but not enough to interfere with her ADLs.  She does admit to occasionally having difficulty with stress.  She mentioned the possibility of getting Xanax.  She continues on amlodipine for her blood pressure.  Also takes extra vitamin D.Does have  X-ray evidence of aortic atherosclerosis her allergies seem to be under good control.  Does have a previous history of TIA and has followed up with neurology but not recently.  Also has blood work indicative of CKD.  She does have cataracts and sees the eye doctor regularly.  Immunizations and Health Maintenance Immunization History  Administered Date(s) Administered   Fluad Quad(high Dose 65+) 06/15/2019, 06/06/2020, 07/12/2021   Influenza, High Dose Seasonal PF 08/06/2013, 07/04/2015, 07/02/2016, 05/12/2017, 07/20/2018   Influenza-Unspecified 07/20/2018   PFIZER(Purple Top)SARS-COV-2 Vaccination 10/21/2019, 11/11/2019, 06/06/2020   Pneumococcal Conjugate-13 01/17/2014   Pneumococcal Polysaccharide-23 07/13/2008, 01/09/2016   Tdap 07/13/2008   Health Maintenance Due  Topic Date Due   Zoster Vaccines- Shingrix (1 of 2) Never done   TETANUS/TDAP  07/13/2018   COLONOSCOPY (Pts 45-73yrs Insurance coverage will need to be confirmed)  03/16/2019   COVID-19 Vaccine (4 - Booster for Pfizer series) 08/01/2020    Last Pap smear: age out  Last mammogram:  02/26/2012   Last colonoscopy Last DEXA: 07/25/2009 Dentist: Q year and half Ophtho: Q year Exercise: N/A  Other doctors caring for patient include:Dr. Fabio Asa / Dr. Carlean Purl GI           Dr. Gershon Crane ophthalmology           Dr. Rosette Reveal dermatology            [place], [place] cardiology            Dr. Rexene Alberts neurology Advanced directives: Does Patient Have a Medical Advance Directive?: No Would patient like information on creating a medical advance directive?: Yes (ED - Information included in AVS)  Depression screen:  See questionnaire below.  Depression screen [facility] 2/9 08/07/2021 08/02/2020 05/09/2020 07/26/2019 05/19/2018  Decreased Interest 0 0 0 0 0  Down, Depressed, Hopeless 0 0 0 2 0  PHQ - 2 Score 0 0 0 2 0  Altered sleeping - - - 3 -  Tired, decreased energy - - - 0 -  Change in appetite - - - 0 -  Feeling bad or failure about yourself  - - - 0 -  Trouble concentrating - - - 0 -  Moving slowly or fidgety/restless - - - 3 -  Suicidal thoughts - - - 0 -  PHQ-9 Score - - - 8 -  Difficult doing work/chores - - - Somewhat difficult -    Fall Risk Screen: see questionnaire below. Fall Risk  08/07/2021 08/02/2020 07/26/2019 05/19/2018 04/01/2017  Falls in the past year? 0 0 0 No No  Number falls in past yr: 0 - - - -  Injury with Fall? 0 - - - -  Risk for fall due to : No Fall Risks - - - -  Follow up Falls evaluation completed - - - -    ADL screen:  See questionnaire below Functional Status Survey: Is the patient deaf or have difficulty hearing?: No Does the patient have difficulty seeing, even when wearing glasses/contacts?: No Does the patient have difficulty concentrating, remembering, or making decisions?: No Does the patient have difficulty walking or climbing stairs?: No Does the patient have difficulty dressing or bathing?: No Does the patient have difficulty doing errands alone such as visiting a doctor's office or shopping?: No   Review of Systems Constitutional: -, -unexpected  weight change, -anorexia, -fatigue Allergy: -sneezing, -itching, -congestion Dermatology: denies changing moles, rash, lumps ENT: -runny nose, -ear pain, -sore throat,  Cardiology:  -chest pain, -palpitations, -orthopnea, Respiratory: -cough, -shortness of breath, -dyspnea on exertion, -wheezing,  Gastroenterology: -abdominal pain, -nausea, -vomiting, -diarrhea, -constipation, -dysphagia Hematology: -bleeding or bruising problems Musculoskeletal: -arthralgias, -myalgias, -joint swelling, -back pain, - Ophthalmology: -vision changes,  Urology: -dysuria, -difficulty urinating,  -urinary frequency, -urgency, incontinence Neurology: -, -numbness, , -memory loss, -falls, -dizziness    PHYSICAL EXAM:   General Appearance: Alert, cooperative, no distress, appears stated age Head: Normocephalic, without obvious abnormality, atraumatic Eyes: PERRL, conjunctiva/corneas clear, EOM's intact,  Ears: Normal TM's and external ear canals Nose: Nares normal, mucosa normal, no drainage or sinus tenderness Throat: Lips, mucosa, and tongue normal; teeth and gums normal Neck: Supple, no lymphadenopathy;  thyroid:  no enlargement/tenderness/nodules; no carotid bruit or JVD Lungs: Clear to auscultation bilaterally without wheezes, rales or ronchi; respirations unlabored Heart: Regular rate and rhythm, S1 and S2 normal, no murmur, rubor gallop Skin:  Skin color, texture, turgor normal, no rashes or lesions Lymph nodes: Cervical, supraclavicular, and axillary nodes normal Neurologic:  CNII-XII intact, normal strength, sensation and gait; reflexes 2+ and symmetric throughout Psych: Normal mood, affect, hygiene and grooming.  ASSESSMENT/PLAN: Routine general medical examination at a health care facility  Aortic atherosclerosis ([place])  Essential hypertension - Plan: amLODipine (NORVASC) 10 MG tablet  Transient cerebral ischemia, unspecified type  Seasonal allergic rhinitis due to  pollen  Gastroesophageal reflux disease without esophagitis  Stage 3a chronic kidney disease (HCC)  Age-related cataract of both eyes, unspecified age-related cataract type  History of vitamin D deficiency - Plan: VITAMIN D 25 Hydroxy (Vit-D Deficiency, Fractures)  GAD (generalized anxiety disorder)  Hyperlipidemia, unspecified hyperlipidemia type - Plan: Lipid panel  Personal history of colonic adenoma  Need for COVID-19 vaccine - Plan: Pension scheme manager  Osteopenia, unspecified location - Plan: DG Bone Density  History of TIA (transient ischemic attack) - Plan: rosuvastatin (CRESTOR) 5 MG tablet Discussed anxiety and good sleep hygiene with her as she is having some difficulty with sleep.  Recommend melatonin for this.  Continue on Crestor.  Discussed anxiety with her in more detail in regard to letting go of things that she has absolutely no control over.  Continue on her vitamin D supplementation.  Follow-up with gastroenterology.  Follow-up with ophthalmology. Also recommend getting Tdap and Shingrix at the drugstore  Discussed healthy diet, including goals of calcium and vitamin D intake  Immunization recommendations discussed.  Colonoscopy recommendations reviewed   Medicare Attestation I have personally reviewed: The patient's medical and social history Their use of alcohol, tobacco or illicit drugs Their current medications and supplements The patient's functional ability including ADLs,fall risks, home safety risks, cognitive, and hearing and visual impairment Diet and physical activities Evidence for depression or  mood disorders  The patient's weight, height, and BMI have been recorded in the chart.  I have made referrals, counseling, and provided education to the patient based on review of the above and I have provided the patient with a written personalized care plan for preventive services.     Jill Alexanders, MD   08/07/2021

## 2021-08-08 LAB — LIPID PANEL
Chol/HDL Ratio: 2.3 ratio (ref 0.0–4.4)
Cholesterol, Total: 188 mg/dL (ref 100–199)
HDL: 81 mg/dL (ref >39)
LDL Chol Calc (NIH): 93 mg/dL (ref 0–99)
Triglycerides: 75 mg/dL (ref 0–149)
VLDL Cholesterol Cal: 14 mg/dL (ref 5–40)

## 2021-08-08 LAB — VITAMIN D 25 HYDROXY (VIT D DEFICIENCY, FRACTURES): Vit D, 25-Hydroxy: 46.1 ng/mL (ref 30.0–100.0)

## 2021-08-19 ENCOUNTER — Other Ambulatory Visit: Payer: Self-pay | Admitting: Physician Assistant

## 2021-08-29 ENCOUNTER — Ambulatory Visit
Admission: RE | Admit: 2021-08-29 | Discharge: 2021-08-29 | Disposition: A | Discharge status: Home / Self Care | Payer: Medicare HMO | Source: Ambulatory Visit | Attending: Family Medicine | Admitting: Family Medicine

## 2021-08-29 DIAGNOSIS — M8589 Other specified disorders of bone density and structure, multiple sites: Secondary | ICD-10-CM | POA: Diagnosis not present

## 2021-08-29 DIAGNOSIS — Z780 Asymptomatic menopausal state: Secondary | ICD-10-CM | POA: Diagnosis not present

## 2021-08-30 ENCOUNTER — Encounter: Payer: Self-pay | Admitting: Physician Assistant

## 2021-08-30 ENCOUNTER — Encounter: Payer: Self-pay | Admitting: Family Medicine

## 2021-08-30 ENCOUNTER — Ambulatory Visit: Payer: Medicare HMO | Admitting: Physician Assistant

## 2021-08-30 VITALS — BP 124/68 | HR 82 | Ht 63.0 in | Wt 185.0 lb

## 2021-08-30 DIAGNOSIS — M8580 Other specified disorders of bone density and structure, unspecified site: Secondary | ICD-10-CM | POA: Insufficient documentation

## 2021-08-30 DIAGNOSIS — K219 Gastro-esophageal reflux disease without esophagitis: Secondary | ICD-10-CM

## 2021-08-30 DIAGNOSIS — R1030 Lower abdominal pain, unspecified: Secondary | ICD-10-CM | POA: Diagnosis not present

## 2021-08-30 NOTE — Progress Notes (Signed)
Chief Complaint: Follow-up abdominal pain  HPI:    Cristina Sawyer is an 80 year old African-American female with a past medical history as listed below including reflux, known to Dr. Carlean Purl, who returns to clinic today for follow-up of her abdominal pain, bloating and gas.       03/15/2014 colonoscopy for personal history of adenomatous polyps with a sessile polyp measuring 6 mm in the transverse colon, severe diverticulosis in the sigmoid colon and otherwise normal.  Pathology showed adenoma.  Discussed possibility of repeat in 5 years.    07/04/2021-07/06/2021 patient was admitted to [facility] for suspected acute upper GI bleed after presenting from home complaining of abdominal pain and bloody school.  Diagnosed with infectious gastroenteritis after eating "questionable food".  White count was elevated patient improved dramatically and was discharged home.  CTAP with contrast showed diverticulosis without diverticulitis.    07/06/2021 normal CBC.  CMP with minimally elevated creatinine at 1.03.    07/12/2021 patient seen in clinic by her PCP.  Noted a Cologuard negative in 2020.  At that time discussed feeling full and bloated with difficulty having bowel movements.    07/26/2021 patient presented to clinic and describes she started with symptoms of abdominal pain, nausea, vomiting and bloating/gas after having "questionable food" 3 weeks prior.  At that time started the patient on Dicyclomine 10 mg 4 times daily before meals and at bedtime as well as Pantoprazole 40 mg once daily.    Today, the patient tells me that she is 99% better.  She has continued on Pantoprazole 40 mg once a day and is also taking her Dicyclomine 10 mg 3 times daily.  She tells me she misses taking her Dicyclomine before meals then she will get a slight amount of discomfort, but in general her bowel movements are better and more solid than ever before as well as predictable.  She is very happy with how much she is  feeling better.    Denies fever, chills, weight loss or blood in her stool.  Past Medical History:  Diagnosis Date   Arthritis    Cataract    Diverticulosis    Dyslipidemia    GERD (gastroesophageal reflux disease)    HTN (hypertension) 2015   Personal history of colonic adenoma 08/09/2008   Vitamin D deficiency     Past Surgical History:  Procedure Laterality Date   ABDOMINAL HYSTERECTOMY  1975   APPENDECTOMY  1975   COLONOSCOPY     HAND SURGERY     Cyst removal   THYROID SURGERY  2000   to remove nodule    Current Outpatient Medications  Medication Sig Dispense Refill   acetaminophen (TYLENOL) 650 MG CR tablet Take 650 mg by mouth every 8 (eight) hours as needed for pain.     amLODipine (NORVASC) 10 MG tablet Take 1 tablet (10 mg total) by mouth daily. 90 tablet 3   aspirin EC 81 MG tablet Take 81 mg by mouth daily.     cetirizine (ZYRTEC) 10 MG tablet Take 10 mg by mouth daily as needed for allergies.     dicyclomine (BENTYL) 10 MG capsule TAKE 1 CAPSULE (10 MG TOTAL) BY MOUTH 4 (FOUR) TIMES DAILY - BEFORE MEALS AND AT BEDTIME. 120 capsule 1   ibuprofen (ADVIL) 200 MG tablet Take 200 mg by mouth every 6 (six) hours as needed.     Multiple Vitamin (MULTI-VITAMIN DAILY PO) Take by mouth.     pantoprazole (PROTONIX) 40 MG tablet Take  1 tablet (40 mg total) by mouth daily. 30 tablet 2   rosuvastatin (CRESTOR) 5 MG tablet Take 1 tablet (5 mg total) by mouth daily. 90 tablet 3   VITAMIN D PO Take 2,000 Units by mouth daily at 12 noon.     No current facility-administered medications for this visit.    Allergies as of 08/30/2021 - Review Complete 08/30/2021  Allergen Reaction Noted   Codeine Itching and Nausea Only 07/26/2008   Ciprofloxacin hcl Rash 06/20/2011    Family History  Problem Relation Age of Onset   Heart disease Sister    Heart disease Sister        in her 49s   Colon cancer Neg Hx     Social History   Socioeconomic History   Marital status: Widowed     Spouse name: Not on file   Number of children: Not on file   Years of education: Not on file   Highest education level: Not on file  Occupational History   Occupation: Retired from Insurance underwriter and [employer]  Tobacco Use   Smoking status: Never   Smokeless tobacco: Never  Vaping Use   Vaping Use: Never used  Substance and Sexual Activity   Alcohol use: Yes    Alcohol/week: 1.0 standard drink    Types: 1 Glasses of wine per week   Drug use: No   Sexual activity: Yes  Other Topics Concern   Not on file  Social History Narrative   Pt lives alone.    Social Determinants of Health   Financial Resource Strain: Not on file  Food Insecurity: Not on file  Transportation Needs: Not on file  Physical Activity: Not on file  Stress: Not on file  Social Connections: Not on file  Intimate Partner Violence: Not on file    Review of Systems:    Constitutional: No weight loss, fever or chills Cardiovascular: No chest pain  Respiratory: No SOB  Gastrointestinal: See HPI and otherwise negative   Physical Exam:  Vital signs: BP 124/68    Pulse 82    Ht 5\' 3"  (1.6 m)    Wt 185 lb (83.9 kg)    BMI 32.77 kg/m   Constitutional:   Pleasant elderly AA female appears to be in NAD, Well developed, Well nourished, alert and cooperative Respiratory: Respirations even and unlabored. Lungs clear to auscultation bilaterally.   No wheezes, crackles, or rhonchi.  Cardiovascular: Normal S1, S2. No MRG. Regular rate and rhythm. No peripheral edema, cyanosis or pallor.  Gastrointestinal:  Soft, nondistended, nontender. No rebound or guarding. Normal bowel sounds. No appreciable masses or hepatomegaly. Rectal:  Not performed.  Psychiatric: Oriented to person, place and time. Demonstrates good judgement and reason without abnormal affect or behaviors.  RELEVANT LABS AND IMAGING: CBC    Component Value Date/Time   WBC 9.9 07/06/2021 0334   RBC 4.57 07/06/2021 0334   HGB 13.0 07/06/2021 0334   HGB 14.9  08/02/2020 1055   HCT 40.0 07/06/2021 0334   HCT 45.3 08/02/2020 1055   PLT 174 07/06/2021 0334   PLT 244 08/02/2020 1055   MCV 87.5 07/06/2021 0334   MCV 86 08/02/2020 1055   MCH 28.4 07/06/2021 0334   MCHC 32.5 07/06/2021 0334   RDW 14.7 07/06/2021 0334   RDW 13.8 08/02/2020 1055   LYMPHSABS 2.2 07/05/2021 0021   LYMPHSABS 2.9 08/02/2020 1055   MONOABS 0.7 07/05/2021 0021   EOSABS 0.0 07/05/2021 0021   EOSABS 0.2 08/02/2020 1055  BASOSABS 0.0 07/05/2021 0021   BASOSABS 0.1 08/02/2020 1055    CMP     Component Value Date/Time   NA 139 07/06/2021 0334   NA 144 08/02/2020 1055   K 3.5 07/06/2021 0334   CL 106 07/06/2021 0334   CO2 25 07/06/2021 0334   GLUCOSE 96 07/06/2021 0334   BUN 16 07/06/2021 0334   BUN 18 08/02/2020 1055   CREATININE 1.03 (H) 07/06/2021 0334   CREATININE 0.99 (H) 05/12/2017 1313   CALCIUM 8.9 07/06/2021 0334   PROT 6.3 (L) 07/06/2021 0334   PROT 7.5 08/02/2020 1055   ALBUMIN 3.6 07/06/2021 0334   ALBUMIN 4.8 (H) 08/02/2020 1055   AST 18 07/06/2021 0334   ALT 14 07/06/2021 0334   ALKPHOS 66 07/06/2021 0334   BILITOT 0.9 07/06/2021 0334   BILITOT 0.8 08/02/2020 1055   GFRNONAA 55 (L) 07/06/2021 0334   GFRAA 56 (L) 08/02/2020 1055    Assessment: 1.  Abdominal pain and change in bowel habits: Resolved on Dicyclomine 10 mg 3 times daily 2.  Heartburn/reflux: Resolved on Pantoprazole daily over the past month  Plan: 1.  At this time recommend the patient try to titrate off of Pantoprazole.  Discussed going to every other day dosing for a week and then every third day and then using as needed until symptoms are gone.  If she cannot do this she will call and let us know at which time she may need further refills. 2.  Patient would like to stay on the Dicyclomine as this has helped her a lot.  Encouraged her to just take it as much as she needs, currently taking it 20 to 30 minutes before meals 3 times a day.  She tells me she has enough of this at  home. 3.  Patient to follow in clinic with [place] as needed.  Cristina Newer, PA-C [facility] 08/30/2021, 10:59 AM  Cc: Denita Lung, MD

## 2021-09-02 ENCOUNTER — Other Ambulatory Visit: Payer: Self-pay | Admitting: Physician Assistant

## 2021-09-11 ENCOUNTER — Emergency Department (HOSPITAL_COMMUNITY): Payer: Medicare HMO

## 2021-09-11 ENCOUNTER — Other Ambulatory Visit: Payer: Self-pay

## 2021-09-11 ENCOUNTER — Emergency Department (HOSPITAL_BASED_OUTPATIENT_CLINIC_OR_DEPARTMENT_OTHER)
Admission: EM | Admit: 2021-09-11 | Discharge: 2021-09-11 | Disposition: A | Discharge status: Home / Self Care | Payer: Medicare HMO | Attending: Emergency Medicine | Admitting: Emergency Medicine

## 2021-09-11 ENCOUNTER — Emergency Department (HOSPITAL_BASED_OUTPATIENT_CLINIC_OR_DEPARTMENT_OTHER): Payer: Medicare HMO | Admitting: Radiology

## 2021-09-11 ENCOUNTER — Encounter (HOSPITAL_BASED_OUTPATIENT_CLINIC_OR_DEPARTMENT_OTHER): Payer: Self-pay | Admitting: Emergency Medicine

## 2021-09-11 DIAGNOSIS — R0602 Shortness of breath: Secondary | ICD-10-CM | POA: Insufficient documentation

## 2021-09-11 DIAGNOSIS — K219 Gastro-esophageal reflux disease without esophagitis: Secondary | ICD-10-CM | POA: Insufficient documentation

## 2021-09-11 DIAGNOSIS — Z20822 Contact with and (suspected) exposure to covid-19: Secondary | ICD-10-CM | POA: Insufficient documentation

## 2021-09-11 DIAGNOSIS — R053 Chronic cough: Secondary | ICD-10-CM | POA: Insufficient documentation

## 2021-09-11 DIAGNOSIS — N183 Chronic kidney disease, stage 3 unspecified: Secondary | ICD-10-CM | POA: Diagnosis not present

## 2021-09-11 DIAGNOSIS — Z7982 Long term (current) use of aspirin: Secondary | ICD-10-CM | POA: Insufficient documentation

## 2021-09-11 DIAGNOSIS — Z7951 Long term (current) use of inhaled steroids: Secondary | ICD-10-CM | POA: Insufficient documentation

## 2021-09-11 DIAGNOSIS — J069 Acute upper respiratory infection, unspecified: Secondary | ICD-10-CM | POA: Diagnosis not present

## 2021-09-11 DIAGNOSIS — Z86010 Personal history of colonic polyps: Secondary | ICD-10-CM | POA: Insufficient documentation

## 2021-09-11 DIAGNOSIS — I129 Hypertensive chronic kidney disease with stage 1 through stage 4 chronic kidney disease, or unspecified chronic kidney disease: Secondary | ICD-10-CM | POA: Diagnosis not present

## 2021-09-11 DIAGNOSIS — R0789 Other chest pain: Secondary | ICD-10-CM | POA: Diagnosis not present

## 2021-09-11 DIAGNOSIS — R059 Cough, unspecified: Secondary | ICD-10-CM | POA: Diagnosis not present

## 2021-09-11 DIAGNOSIS — Z79899 Other long term (current) drug therapy: Secondary | ICD-10-CM | POA: Diagnosis not present

## 2021-09-11 LAB — BASIC METABOLIC PANEL
Anion gap: 10 (ref 5–15)
BUN: 14 mg/dL (ref 8–23)
CO2: 25 mmol/L (ref 22–32)
Calcium: 9.5 mg/dL (ref 8.9–10.3)
Chloride: 106 mmol/L (ref 98–111)
Creatinine, Ser: 0.99 mg/dL (ref 0.44–1.00)
GFR, Estimated: 58 mL/min — ABNORMAL LOW (ref >60)
Glucose, Bld: 92 mg/dL (ref 70–99)
Potassium: 4.1 mmol/L (ref 3.5–5.1)
Sodium: 141 mmol/L (ref 135–145)

## 2021-09-11 LAB — CBC WITH DIFFERENTIAL/PLATELET
Abs Immature Granulocytes: 0.03 10*3/uL (ref 0.00–0.07)
Basophils Absolute: 0.1 10*3/uL (ref 0.0–0.1)
Basophils Relative: 1 %
Eosinophils Absolute: 0.1 10*3/uL (ref 0.0–0.5)
Eosinophils Relative: 1 %
HCT: 42 % (ref 36.0–46.0)
Hemoglobin: 13.6 g/dL (ref 12.0–15.0)
Immature Granulocytes: 0 %
Lymphocytes Relative: 22 %
Lymphs Abs: 2.5 10*3/uL (ref 0.7–4.0)
MCH: 27.4 pg (ref 26.0–34.0)
MCHC: 32.4 g/dL (ref 30.0–36.0)
MCV: 84.5 fL (ref 80.0–100.0)
Monocytes Absolute: 0.6 10*3/uL (ref 0.1–1.0)
Monocytes Relative: 6 %
Neutro Abs: 8 10*3/uL — ABNORMAL HIGH (ref 1.7–7.7)
Neutrophils Relative %: 70 %
Platelets: 220 10*3/uL (ref 150–400)
RBC: 4.97 MIL/uL (ref 3.87–5.11)
RDW: 14.6 % (ref 11.5–15.5)
WBC: 11.3 10*3/uL — ABNORMAL HIGH (ref 4.0–10.5)
nRBC: 0 % (ref 0.0–0.2)

## 2021-09-11 LAB — RESP PANEL BY RT-PCR (FLU A&B, COVID) ARPGX2
Influenza A by PCR: NEGATIVE
Influenza B by PCR: NEGATIVE
SARS Coronavirus 2 by RT PCR: NEGATIVE

## 2021-09-11 LAB — BRAIN NATRIURETIC PEPTIDE: B Natriuretic Peptide: 23.5 pg/mL (ref 0.0–100.0)

## 2021-09-11 LAB — TROPONIN I (HIGH SENSITIVITY)
Troponin I (High Sensitivity): 3 ng/L (ref <18)
Troponin I (High Sensitivity): <2 ng/L (ref <18)

## 2021-09-11 MED ORDER — GUAIFENESIN ER 600 MG PO TB12: 600.0000 mg | ORAL_TABLET | Freq: Two times a day (BID) | ORAL | 0 refills | Status: AC

## 2021-09-11 MED ORDER — DEXTROMETHORPHAN POLISTIREX ER 30 MG/5ML PO SUER: 15.0000 mg | ORAL | 0 refills | Status: DC | PRN

## 2021-09-11 MED ORDER — ALBUTEROL SULFATE HFA 108 (90 BASE) MCG/ACT IN AERS
1.0000 | INHALATION_SPRAY | Freq: Four times a day (QID) | RESPIRATORY_TRACT | 0 refills | Status: DC | PRN
Start: 2021-09-11 — End: 2022-12-24

## 2021-09-11 MED ORDER — FLUTICASONE PROPIONATE 50 MCG/ACT NA SUSP: 1.0000 | Freq: Every day | NASAL | 0 refills | Status: DC

## 2021-09-11 MED ORDER — IPRATROPIUM-ALBUTEROL 0.5-2.5 (3) MG/3ML IN SOLN
3.0000 mL | Freq: Once | RESPIRATORY_TRACT | Status: AC
  Administered 2021-09-11: 17:00:00 3 mL via RESPIRATORY_TRACT
  Filled 2021-09-11: qty 3

## 2021-09-11 NOTE — ED Provider Notes (Signed)
[facility] EMERGENCY DEPT Provider Note   CSN: [id number] Arrival date & time: 09/11/21  1311     History Chief Complaint  Patient presents with   Shortness of Breath   Cough    Cristina Sawyer is a 80 y.o. female.  This is a 80 y.o. female with significant medical history as below, including diverticulosis, HLD, HTN who presents to the ED with complaint of cough, chest pain, dyspnea x3 weeks.  Patient reports ongoing URI symptoms, positive sick contacts.  Patient feels symptoms worse in the past 2 to 3 days.  She is having alternating fevers and chills, subjective.  Chest tightness and pressure that is midsternal.  Nonradiating.  Not exacerbated by exertion.  She is having some mild dyspnea that she does feel is exacerbated by exertion.  No nausea or vomiting, no lightheadedness, no change in bowel or bladder function, no change oral intake.  Taking medication as prescribed, no falls, no rashes.  Accompanied by family.  The history is provided by the patient and a relative. No language interpreter was used.  Shortness of Breath Associated symptoms: chest pain and cough   Associated symptoms: no abdominal pain, no fever, no headaches and no rash   Cough Associated symptoms: chest pain and shortness of breath   Associated symptoms: no eye discharge, no fever, no headaches and no rash       Past Medical History:  Diagnosis Date   Arthritis    Cataract    Diverticulosis    Dyslipidemia    GERD (gastroesophageal reflux disease)    HTN (hypertension) 2015   Personal history of colonic adenoma 08/09/2008   Vitamin D deficiency     Patient Active Problem List   Diagnosis Date Noted   Osteopenia 08/30/2021   Aortic atherosclerosis ([place]) 07/12/2021   Diverticulosis 07/12/2021   Leukocytosis 07/05/2021   GAD (generalized anxiety disorder) 07/05/2021   Personal history of calcium pyrophosphate deposition disease (CPPD) 04/11/2020   History of vitamin D  deficiency 07/26/2019   Age-related cataract of both eyes 05/19/2018   Hyperlipidemia 05/19/2018   CKD (chronic kidney disease) stage 3, GFR 30-59 ml/min ([place]) 02/14/2018   Essential hypertension 05/12/2017   Transient cerebral ischemia 05/12/2017   Seasonal allergic rhinitis due to pollen 12/29/2014   GERD (gastroesophageal reflux disease) 01/17/2014   Personal history of colonic adenoma 08/09/2008    Past Surgical History:  Procedure Laterality Date   ABDOMINAL HYSTERECTOMY  1975   APPENDECTOMY  1975   COLONOSCOPY     HAND SURGERY     Cyst removal   THYROID SURGERY  2000   to remove nodule     OB History   No obstetric history on file.     Family History  Problem Relation Age of Onset   Heart disease Sister    Heart disease Sister        in her 3s   Colon cancer Neg Hx     Social History   Tobacco Use   Smoking status: Never   Smokeless tobacco: Never  Vaping Use   Vaping Use: Never used  Substance Use Topics   Alcohol use: Yes    Alcohol/week: 1.0 standard drink    Types: 1 Glasses of wine per week   Drug use: No    Home Medications Prior to Admission medications   Medication Sig Start Date End Date Taking? Authorizing Provider  albuterol (VENTOLIN HFA) 108 (90 Base) MCG/ACT inhaler Inhale 1-2 puffs into the lungs every  6 (six) hours as needed for wheezing or shortness of breath. 09/11/21  Yes Jeanell Sparrow, DO  dextromethorphan (DELSYM) 30 MG/5ML liquid Take 2.5 mLs (15 mg total) by mouth as needed for cough. 09/11/21  Yes Wynona Dove A, DO  fluticasone (FLONASE) 50 MCG/ACT nasal spray Place 1 spray into both nostrils daily for 7 days. 09/11/21 09/18/21 Yes Jeanell Sparrow, DO  guaiFENesin (MUCINEX) 600 MG 12 hr tablet Take 1 tablet (600 mg total) by mouth 2 (two) times daily for 5 days. 09/11/21 09/16/21 Yes Jeanell Sparrow, DO  acetaminophen (TYLENOL) 650 MG CR tablet Take 650 mg by mouth every 8 (eight) hours as needed for pain.    [provider]   amLODipine (NORVASC) 10 MG tablet Take 1 tablet (10 mg total) by mouth daily. 08/07/21   Denita Lung, MD  aspirin EC 81 MG tablet Take 81 mg by mouth daily.    [provider]  cetirizine (ZYRTEC) 10 MG tablet Take 10 mg by mouth daily as needed for allergies.    [provider]  dicyclomine (BENTYL) 10 MG capsule TAKE 1 CAPSULE (10 MG TOTAL) BY MOUTH 4 TIMES A DAY BEFORE MEALS AND AT BEDTIME 09/03/21   Levin Erp, PA  ibuprofen (ADVIL) 200 MG tablet Take 200 mg by mouth every 6 (six) hours as needed.    [provider]  Multiple Vitamin (MULTI-VITAMIN DAILY PO) Take by mouth.    [provider]  pantoprazole (PROTONIX) 40 MG tablet Take 1 tablet (40 mg total) by mouth daily. 07/26/21   Levin Erp, PA  rosuvastatin (CRESTOR) 5 MG tablet Take 1 tablet (5 mg total) by mouth daily. 08/07/21   Denita Lung, MD  VITAMIN D PO Take 2,000 Units by mouth daily at 12 noon.    [provider]  omeprazole (PRILOSEC OTC) 20 MG tablet Take 20 mg by mouth daily.    12/11/11  [provider]    Allergies    Codeine and Ciprofloxacin hcl  Review of Systems   Review of Systems  Constitutional:  Negative for activity change and fever.  HENT:  Negative for facial swelling and trouble swallowing.   Eyes:  Negative for discharge and redness.  Respiratory:  Positive for cough, chest tightness and shortness of breath.   Cardiovascular:  Positive for chest pain. Negative for palpitations.  Gastrointestinal:  Negative for abdominal pain and nausea.  Genitourinary:  Negative for dysuria and flank pain.  Musculoskeletal:  Negative for back pain and gait problem.  Skin:  Negative for pallor and rash.  Neurological:  Negative for syncope and headaches.   Physical Exam Updated Vital Signs BP (!) 141/82 (BP Location: Right Arm)    Pulse 75    Temp 97.7 F (36.5 C) (Oral)    Resp 17    Ht 5\' 3"  (1.6 m)    Wt 81.6 kg    SpO2 100%     BMI 31.89 kg/m   Physical Exam Vitals and nursing note reviewed.  Constitutional:      General: She is not in acute distress.    Appearance: Normal appearance. She is well-developed. She is not ill-appearing or diaphoretic.  HENT:     Head: Normocephalic and atraumatic.     Right Ear: External ear normal.     Left Ear: External ear normal.     Nose: Nose normal.     Mouth/Throat:     Mouth: Mucous membranes are moist.  Eyes:     General: No scleral icterus.       Right eye: No discharge.        Left eye: No discharge.  Cardiovascular:     Rate and Rhythm: Normal rate and regular rhythm.     Pulses: Normal pulses.     Heart sounds: Normal heart sounds.  Pulmonary:     Effort: Pulmonary effort is normal. No tachypnea, accessory muscle usage or respiratory distress.     Breath sounds: Decreased breath sounds present. No wheezing or rhonchi.  Abdominal:     General: Abdomen is flat.     Tenderness: There is no abdominal tenderness.  Musculoskeletal:        General: Normal range of motion.     Cervical back: Normal range of motion.     Right lower leg: No edema.     Left lower leg: No edema.  Skin:    General: Skin is warm and dry.     Capillary Refill: Capillary refill takes less than 2 seconds.  Neurological:     Mental Status: She is alert.  Psychiatric:        Mood and Affect: Mood normal.        Behavior: Behavior normal.    ED Results / Procedures / Treatments   Labs (all labs ordered are listed, but only abnormal results are displayed) Labs Reviewed  BASIC METABOLIC PANEL - Abnormal; Notable for the following components:      Result Value   GFR, Estimated 58 (*)    All other components within normal limits  CBC WITH DIFFERENTIAL/PLATELET - Abnormal; Notable for the following components:   WBC 11.3 (*)    Neutro Abs 8.0 (*)    All other components within normal limits  RESP PANEL BY RT-PCR (FLU A&B, COVID) ARPGX2  BRAIN NATRIURETIC PEPTIDE  TROPONIN I (HIGH  SENSITIVITY)  TROPONIN I (HIGH SENSITIVITY)    EKG EKG Interpretation  Date/Time:  Tuesday September 11 2021 13:51:53 EST Ventricular Rate:  77 PR Interval:  164 QRS Duration: 68 QT Interval:  384 QTC Calculation: 434 R Axis:   77 Text Interpretation: Normal sinus rhythm Right atrial enlargement Nonspecific T wave abnormality Abnormal ECG Interpretation limited secondary to artifact similar to prior tracings Confirmed by Wynona Dove ([id number]) on 09/11/2021 5:20:43 PM  Radiology DG Chest 2 View  Result Date: 09/11/2021 CLINICAL DATA:  Shortness of breath EXAM: CHEST - 2 VIEW COMPARISON:  May 2019 FINDINGS: The heart size and mediastinal contours are within normal limits. Both lungs are clear. No pleural effusion or pneumothorax. Mild degenerative changes of the thoracic spine. IMPRESSION: No active cardiopulmonary disease. Electronically Signed   By: Macy Mis M.D.   On: 09/11/2021 14:41    Procedures Procedures   Medications Ordered in ED Medications  ipratropium-albuterol (DUONEB) 0.5-2.5 (3) MG/3ML nebulizer solution 3 mL (3 mLs Nebulization Given 09/11/21 1729)    ED Course  I have reviewed the triage vital signs and the nursing notes.  Pertinent labs & imaging results that were available during my care of the patient were reviewed by me and considered in my medical decision making (see chart for details).    MDM Rules/Calculators/A&P                          CC: uri s/s, cp, dib  This patient complains of above; this involves an extensive number of treatment options and is a complaint that carries with  it a high risk of complications and morbidity. Vital signs were reviewed. Serious etiologies considered.  Record review:  Previous records obtained and reviewed   Additional history obtained from family at bedside  Work up as above, notable for: Labs & imaging results that were available during my care of the patient were reviewed by me and considered in my medical  decision making.   I ordered imaging studies which included CXR and I independently visualized and interpreted imaging which showed no acute process    Symptoms greatly improved following nebulized breathing treatment. She is feeling significantly better, chest tightness has resolved, she is ambulatory, no respiratory distress, feels roughly back to her baseline.    EKG with evidence of acute ischemia, chest x-ray negative, troponin negative x2.  ACS is less likely.  Favor likely viral upper respiratory tract infection as etiology of her symptoms today.  Discussed supportive care and close outpatient follow-up.  The patient improved significantly and was discharged in stable condition. Detailed discussions were had with the patient regarding current findings, and need for close f/u with PCP or on call doctor. The patient has been instructed to return immediately if the symptoms worsen in any way for re-evaluation. Patient verbalized understanding and is in agreement with current care plan. All questions answered prior to discharge.       This chart was dictated using voice recognition software.  Despite best efforts to proofread,  errors can occur which can change the documentation meaning.    Final Clinical Impression(s) / ED Diagnoses Final diagnoses:  Upper respiratory tract infection, unspecified type    Rx / DC Orders ED Discharge Orders          Ordered    albuterol (VENTOLIN HFA) 108 (90 Base) MCG/ACT inhaler  Every 6 hours PRN        09/11/21 1954    guaiFENesin (MUCINEX) 600 MG 12 hr tablet  2 times daily        09/11/21 1954    fluticasone (FLONASE) 50 MCG/ACT nasal spray  Daily        09/11/21 1954    dextromethorphan (DELSYM) 30 MG/5ML liquid  As needed        09/11/21 Moore, Grover, DO 09/11/21 2357

## 2021-09-11 NOTE — ED Triage Notes (Signed)
Cough x 3 weeks , shortness of breath , tightness of chest x 1 day .

## 2021-09-11 NOTE — Discharge Instructions (Addendum)
Please return to the ER for any worsening or worrisome symptoms.

## 2021-10-16 ENCOUNTER — Other Ambulatory Visit: Payer: Self-pay | Admitting: Physician Assistant

## 2022-03-21 DIAGNOSIS — H5213 Myopia, bilateral: Secondary | ICD-10-CM | POA: Diagnosis not present

## 2022-03-21 DIAGNOSIS — H52203 Unspecified astigmatism, bilateral: Secondary | ICD-10-CM | POA: Diagnosis not present

## 2022-03-21 DIAGNOSIS — H524 Presbyopia: Secondary | ICD-10-CM | POA: Diagnosis not present

## 2022-05-22 ENCOUNTER — Encounter: Payer: Self-pay | Admitting: Internal Medicine

## 2022-06-06 ENCOUNTER — Encounter: Payer: Self-pay | Admitting: Family Medicine

## 2022-06-06 ENCOUNTER — Ambulatory Visit (INDEPENDENT_AMBULATORY_CARE_PROVIDER_SITE_OTHER): Payer: Medicare HMO | Admitting: Family Medicine

## 2022-06-06 ENCOUNTER — Ambulatory Visit
Admission: RE | Admit: 2022-06-06 | Discharge: 2022-06-06 | Disposition: A | Discharge status: Home / Self Care | Payer: Medicare HMO | Source: Ambulatory Visit | Attending: Family Medicine | Admitting: Family Medicine

## 2022-06-06 VITALS — BP 122/66 | HR 76 | Temp 98.1°F | Wt 185.0 lb

## 2022-06-06 DIAGNOSIS — M25561 Pain in right knee: Secondary | ICD-10-CM | POA: Diagnosis not present

## 2022-06-06 DIAGNOSIS — M25562 Pain in left knee: Secondary | ICD-10-CM | POA: Diagnosis not present

## 2022-06-06 DIAGNOSIS — G8929 Other chronic pain: Secondary | ICD-10-CM

## 2022-06-06 DIAGNOSIS — Z23 Encounter for immunization: Secondary | ICD-10-CM | POA: Diagnosis not present

## 2022-06-06 DIAGNOSIS — Z8719 Personal history of other diseases of the digestive system: Secondary | ICD-10-CM

## 2022-06-06 DIAGNOSIS — M7989 Other specified soft tissue disorders: Secondary | ICD-10-CM | POA: Diagnosis not present

## 2022-06-06 MED ORDER — CELECOXIB 200 MG PO CAPS: 200.0000 mg | ORAL_CAPSULE | Freq: Two times a day (BID) | ORAL | 2 refills | Status: DC

## 2022-06-06 NOTE — Progress Notes (Signed)
   Subjective:    Patient ID: Cristina Sawyer, female    DOB: 05-12-1941, 81 y.o.   MRN: [id number]  HPI Sh on the right.  e has had difficulty with bilateral knee pain for well over a year and states that the right is greater than left.  It is now interfering with her ADLs.  She states that in the recent past I did give her an injection into the knee She does have a history of GI bleed.  Review of Systems     Objective:   Physical Exam Exam of the right knee does show some lipping present, no effusion.  No popping or locking.  Medial and lateral collateral ligament intact.  Negative ACL. Exam of the left knee shows less arthritic changes with no effusion Full motion.  Ligaments intact.     Assessment & Plan:  Chronic pain of both knees - Plan: DG Knee Complete 4 Views Right, DG Knee Complete 4 Views Left  Need for influenza vaccination - Plan: Flu Vaccine QUAD High Dose(Fluad) I explained that this does sound like arthritis and depending upon what the x-rays show, possible injection versus referral will be made.  She was comfortable with that. Celebrex was called in.  Cautioned her on the use of any other NSAID.  She can also use Tylenol.

## 2022-06-17 ENCOUNTER — Encounter: Payer: Self-pay | Admitting: Family Medicine

## 2022-06-17 ENCOUNTER — Ambulatory Visit (INDEPENDENT_AMBULATORY_CARE_PROVIDER_SITE_OTHER): Payer: Medicare HMO | Admitting: Family Medicine

## 2022-06-17 VITALS — BP 130/70 | HR 87 | Temp 98.1°F | Wt 187.4 lb

## 2022-06-17 DIAGNOSIS — M25561 Pain in right knee: Secondary | ICD-10-CM

## 2022-06-17 DIAGNOSIS — M25562 Pain in left knee: Secondary | ICD-10-CM

## 2022-06-17 DIAGNOSIS — G8929 Other chronic pain: Secondary | ICD-10-CM

## 2022-06-17 MED ORDER — TRIAMCINOLONE ACETONIDE 40 MG/ML IJ SUSP
40.0000 mg | Freq: Once | INTRAMUSCULAR | Status: AC
  Administered 2022-06-17: 40 mg via INTRAMUSCULAR

## 2022-06-17 MED ORDER — LIDOCAINE HCL 1 % IJ SOLN
10.0000 mL | Freq: Once | INTRAMUSCULAR | Status: AC
  Administered 2022-06-17: 10 mL via INTRADERMAL

## 2022-06-17 NOTE — Progress Notes (Signed)
   Subjective:    Patient ID: Cristina Sawyer, female    DOB: [birth year], 81 y.o.   MRN: [id number]  HPI She is here for recheck on arthritis.  She has had bilateral knee pain, right greater than left.  Recent x-rays did show DJD especially laterally in the right knee and to a lesser extent in the left.   Review of Systems     Objective:   Physical Exam Alert and in no distress.  Joint line difficult to feel.       Assessment & Plan:  Chronic pain of both knees The right lateral knee was evaluated and the joint line was difficult to feel.  Betadine was used and 40 mg of Kenalog and 3 cc of Xylocaine was injected into the joint space with slight difficulty due to hitting bone.  She obtained 75% reduction in her pain and was very happy with this. I then discussed repeating this explaining that if this lasts for several months, it might be worth repeating again but if it lasts a shorter period of time, orthopedic referral for possible joint injection with synthetic medication versus discussion for TKR.

## 2022-06-17 NOTE — Addendum Note (Signed)
Addended by: Elyse Jarvis on: 06/17/2022 03:09 PM   Modules accepted: Orders

## 2022-06-25 ENCOUNTER — Encounter: Payer: Self-pay | Admitting: Internal Medicine

## 2022-07-08 ENCOUNTER — Encounter: Payer: Self-pay | Admitting: Internal Medicine

## 2022-08-22 ENCOUNTER — Other Ambulatory Visit: Payer: Self-pay | Admitting: Family Medicine

## 2022-08-22 DIAGNOSIS — Z8673 Personal history of transient ischemic attack (TIA), and cerebral infarction without residual deficits: Secondary | ICD-10-CM

## 2022-08-29 ENCOUNTER — Encounter: Payer: Self-pay | Admitting: Family Medicine

## 2022-08-29 ENCOUNTER — Ambulatory Visit (INDEPENDENT_AMBULATORY_CARE_PROVIDER_SITE_OTHER): Payer: Medicare HMO | Admitting: Family Medicine

## 2022-08-29 VITALS — BP 114/70 | HR 77 | Ht 62.75 in | Wt 183.6 lb

## 2022-08-29 DIAGNOSIS — I10 Essential (primary) hypertension: Secondary | ICD-10-CM | POA: Diagnosis not present

## 2022-08-29 DIAGNOSIS — K5790 Diverticulosis of intestine, part unspecified, without perforation or abscess without bleeding: Secondary | ICD-10-CM | POA: Diagnosis not present

## 2022-08-29 DIAGNOSIS — M1990 Unspecified osteoarthritis, unspecified site: Secondary | ICD-10-CM

## 2022-08-29 DIAGNOSIS — Z0000 Encounter for general adult medical examination without abnormal findings: Secondary | ICD-10-CM | POA: Diagnosis not present

## 2022-08-29 DIAGNOSIS — R69 Illness, unspecified: Secondary | ICD-10-CM | POA: Diagnosis not present

## 2022-08-29 DIAGNOSIS — G459 Transient cerebral ischemic attack, unspecified: Secondary | ICD-10-CM | POA: Diagnosis not present

## 2022-08-29 DIAGNOSIS — F411 Generalized anxiety disorder: Secondary | ICD-10-CM

## 2022-08-29 DIAGNOSIS — H259 Unspecified age-related cataract: Secondary | ICD-10-CM

## 2022-08-29 DIAGNOSIS — Z8639 Personal history of other endocrine, nutritional and metabolic disease: Secondary | ICD-10-CM | POA: Diagnosis not present

## 2022-08-29 DIAGNOSIS — M8580 Other specified disorders of bone density and structure, unspecified site: Secondary | ICD-10-CM | POA: Diagnosis not present

## 2022-08-29 DIAGNOSIS — I700 Atherosclerosis of aorta: Secondary | ICD-10-CM

## 2022-08-29 DIAGNOSIS — K219 Gastro-esophageal reflux disease without esophagitis: Secondary | ICD-10-CM

## 2022-08-29 DIAGNOSIS — J301 Allergic rhinitis due to pollen: Secondary | ICD-10-CM | POA: Diagnosis not present

## 2022-08-29 DIAGNOSIS — Z8719 Personal history of other diseases of the digestive system: Secondary | ICD-10-CM | POA: Diagnosis not present

## 2022-08-29 DIAGNOSIS — N1831 Chronic kidney disease, stage 3a: Secondary | ICD-10-CM

## 2022-08-29 DIAGNOSIS — Z7982 Long term (current) use of aspirin: Secondary | ICD-10-CM | POA: Diagnosis not present

## 2022-08-29 DIAGNOSIS — E785 Hyperlipidemia, unspecified: Secondary | ICD-10-CM

## 2022-08-29 DIAGNOSIS — Z86010 Personal history of colonic polyps: Secondary | ICD-10-CM

## 2022-08-29 NOTE — Progress Notes (Signed)
Cristina Sawyer is a 81 y.o. female who presents for annual wellness visit,CPE and follow-up on chronic medical conditions.  She has been under a lot of stress recently helping to take care of her brother-in-law who is apparently confined more and more help.  They do have in home health care but this is still stressing her out and interfering with her own personal life.  She states that the right knee did feel fine but she is now having difficulty with that again only after 2 months.  Her reflux seems to be under good control.  She sees her ophthalmologist regularly.  Her allergies seem to be under good control.  She is followed regularly by ophthalmology.  Does have x-ray evidence of aortic atherosclerosis. Past medical and social history was evaluated. Immunizations and Health Maintenance Immunization History  Administered Date(s) Administered   COVID-19, mRNA, vaccine(Comirnaty)12 years and older 06/28/2022   Fluad Quad(high Dose 65+) 06/15/2019, 06/06/2020, 07/12/2021, 06/06/2022   Influenza, High Dose Seasonal PF 08/06/2013, 07/04/2015, 07/02/2016, 05/12/2017, 07/20/2018   Influenza-Unspecified 07/20/2018   PFIZER(Purple Top)SARS-COV-2 Vaccination 10/21/2019, 11/11/2019, 06/06/2020   Pfizer Covid-19 Vaccine Bivalent Booster 42yr & up 08/07/2021   Pneumococcal Conjugate-13 01/17/2014   Pneumococcal Polysaccharide-23 07/13/2008, 01/09/2016   Tdap 07/13/2008   Zoster Recombinat (Shingrix) 12/06/2021, 03/26/2022   Health Maintenance Due  Topic Date Due   DTaP/Tdap/Td (2 - Td or Tdap) 07/13/2018   COLONOSCOPY (Pts 45-419yrInsurance coverage will need to be confirmed)  03/16/2019    Last Pap smear: N/a Last mammogram:02/26/2012 Last colonoscopy: 2015 Last DEXA: 08/29/2021 Dentist: 2022 Ophtho: 2023 Exercise:none  Other doctors caring for patient include: TuSueanne MargaritaMD Cardiology     Advanced directives: no information given.    Depression screen:  See questionnaire  below.     08/29/2022   10:10 AM 08/07/2021   10:09 AM 08/02/2020    9:28 AM 05/09/2020   11:35 AM 07/26/2019    1:47 PM  Depression screen PHQ 2/9  Decreased Interest 0 0 0 0 0  Down, Depressed, Hopeless 0 0 0 0 2  PHQ - 2 Score 0 0 0 0 2  Altered sleeping     3  Tired, decreased energy     0  Change in appetite     0  Feeling bad or failure about yourself      0  Trouble concentrating     0  Moving slowly or fidgety/restless     3  Suicidal thoughts     0  PHQ-9 Score     8  Difficult doing work/chores     Somewhat difficult    Fall Risk Screen: see questionnaire below.    08/29/2022   10:10 AM 08/07/2021   10:09 AM 08/02/2020    9:28 AM 07/26/2019    1:46 PM 05/19/2018    1:30 PM  Fall Risk   Falls in the past year? 0 0 0 0 No  Number falls in past yr: 0 0     Injury with Fall? 0 0     Risk for fall due to : No Fall Risks No Fall Risks     Follow up Falls evaluation completed Falls evaluation completed       ADL screen:  See questionnaire below Functional Status Survey: Is the patient deaf or have difficulty hearing?: No Does the patient have difficulty seeing, even when wearing glasses/contacts?: No Does the patient have difficulty concentrating, remembering, or making decisions?: Yes Does the patient  have difficulty walking or climbing stairs?: No Does the patient have difficulty dressing or bathing?: No Does the patient have difficulty doing errands alone such as visiting a doctor's office or shopping?: No   Review of Systems Constitutional: -, -unexpected weight change, -anorexia, -fatigue Allergy: -sneezing, -itching, -congestion Dermatology: denies changing moles, rash, lumps ENT: -runny nose, -ear pain, -sore throat,  Cardiology:  -chest pain, -palpitations, -orthopnea, Respiratory: -cough, -shortness of breath, -dyspnea on exertion, -wheezing,  Gastroenterology: -abdominal pain, -nausea, -vomiting, -diarrhea, -constipation, -dysphagia Hematology:  -bleeding or bruising problems Musculoskeletal: -arthralgias, -myalgias, -joint swelling, -back pain, - Ophthalmology: -vision changes,  Urology: -dysuria, -difficulty urinating,  -urinary frequency, -urgency, incontinence Neurology: -, -numbness, , -memory loss, -falls, -dizziness    PHYSICAL EXAM:  BP 114/70   Pulse 77   Ht 5' 2.75" (1.594 m)   Wt 183 lb 9.6 oz (83.3 kg)   SpO2 100% Comment: room air  BMI 32.78 kg/m   General Appearance: Alert, cooperative, no distress, appears stated age Head: Normocephalic, without obvious abnormality, atraumatic Eyes: PERRL, conjunctiva/corneas clear, EOM's intact,  Ears: Normal TM's and external ear canals Nose: Nares normal, mucosa normal, no drainage or sinus tenderness Throat: Lips, mucosa, and tongue normal; teeth and gums normal Neck: Supple, no lymphadenopathy;  thyroid:  no enlargement/tenderness/nodules; no carotid bruit or JVD Lungs: Clear to auscultation bilaterally without wheezes, rales or ronchi; respirations unlabored Heart: Regular rate and rhythm, S1 and S2 normal, no murmur, rubor gallop Abdomen: Soft, non-tender, nondistended, normoactive bowel sounds,  no masses, no hepatosplenomegaly Extremities: No clubbing, cyanosis or edema Pulses: 2+ and symmetric all extremities Skin:  Skin color, texture, turgor normal, no rashes or lesions Lymph nodes: Cervical, supraclavicular, and axillary nodes normal Neurologic:  CNII-XII intact, normal strength, sensation and gait; reflexes 2+ and symmetric throughout Psych: Normal mood, affect, hygiene and grooming.  ASSESSMENT/PLAN: Routine general medical examination at a health care facility - Plan: CBC with Differential/Platelet, Comprehensive metabolic panel, Lipid panel, CBC with Differential/Platelet  Transient cerebral ischemia, unspecified type  Essential hypertension - Plan: CBC with Differential/Platelet, Comprehensive metabolic panel  Aortic atherosclerosis (HCC) - Plan:  Lipid panel, CANCELED: Lipid panel  Seasonal allergic rhinitis due to pollen  Diverticulosis  Osteopenia, unspecified location  Hyperlipidemia, unspecified hyperlipidemia type - Plan: Lipid panel  History of vitamin D deficiency - Plan: VITAMIN D 25 Hydroxy (Vit-D Deficiency, Fractures)  GAD (generalized anxiety disorder)  Age-related cataract of both eyes, unspecified age-related cataract type  Arthritis - Plan: Ambulatory referral to Orthopedic Surgery  Stage 3a chronic kidney disease ([place])  Personal history of colonic adenoma  Gastroesophageal reflux disease without esophagitis She will continue on her present medication regimen.  Recommend she get Tdap and RSV.  I then discussed the need for her to back off on taking care of her brother-in-law as it is interfering with her own personal life.  I explained that she has a right to have a life of her own and to not feel bad about not being able to care for this gentleman.   Discussed .  Immunization recommendations discussed.   Medicare Attestation I have personally reviewed: The patient's medical and social history Their use of alcohol, tobacco or illicit drugs Their current medications and supplements The patient's functional ability including ADLs,fall risks, home safety risks, cognitive, and hearing and visual impairment Diet and physical activities Evidence for depression or mood disorders  The patient's weight, height, and BMI have been recorded in the chart.  I have made referrals, counseling, and provided  education to the patient based on review of the above and I have provided the patient with a written personalized care plan for preventive services.     Jill Alexanders, MD   08/29/2022

## 2022-08-30 LAB — CBC WITH DIFFERENTIAL/PLATELET
Basophils Absolute: 0.1 10*3/uL (ref 0.0–0.2)
Basos: 1 %
EOS (ABSOLUTE): 0.3 10*3/uL (ref 0.0–0.4)
Eos: 3 %
Hematocrit: 46.4 % (ref 34.0–46.6)
Hemoglobin: 15 g/dL (ref 11.1–15.9)
Immature Grans (Abs): 0 10*3/uL (ref 0.0–0.1)
Immature Granulocytes: 0 %
Lymphocytes Absolute: 2.9 10*3/uL (ref 0.7–3.1)
Lymphs: 29 %
MCH: 27.6 pg (ref 26.6–33.0)
MCHC: 32.3 g/dL (ref 31.5–35.7)
MCV: 86 fL (ref 79–97)
Monocytes Absolute: 0.6 10*3/uL (ref 0.1–0.9)
Monocytes: 6 %
Neutrophils Absolute: 5.9 10*3/uL (ref 1.4–7.0)
Neutrophils: 61 %
Platelets: 230 10*3/uL (ref 150–450)
RBC: 5.43 x10E6/uL — ABNORMAL HIGH (ref 3.77–5.28)
RDW: 13.7 % (ref 11.7–15.4)
WBC: 9.7 10*3/uL (ref 3.4–10.8)

## 2022-08-30 LAB — COMPREHENSIVE METABOLIC PANEL
ALT: 14 IU/L (ref 0–32)
AST: 14 IU/L (ref 0–40)
Albumin/Globulin Ratio: 1.8 (ref 1.2–2.2)
Albumin: 4.6 g/dL (ref 3.7–4.7)
Alkaline Phosphatase: 102 IU/L (ref 44–121)
BUN/Creatinine Ratio: 13 (ref 12–28)
BUN: 15 mg/dL (ref 8–27)
Bilirubin Total: 0.4 mg/dL (ref 0.0–1.2)
CO2: 25 mmol/L (ref 20–29)
Calcium: 9.7 mg/dL (ref 8.7–10.3)
Chloride: 106 mmol/L (ref 96–106)
Creatinine, Ser: 1.15 mg/dL — ABNORMAL HIGH (ref 0.57–1.00)
Globulin, Total: 2.5 g/dL (ref 1.5–4.5)
Glucose: 105 mg/dL — ABNORMAL HIGH (ref 70–99)
Potassium: 4.8 mmol/L (ref 3.5–5.2)
Sodium: 147 mmol/L — ABNORMAL HIGH (ref 134–144)
Total Protein: 7.1 g/dL (ref 6.0–8.5)
eGFR: 48 mL/min/{1.73_m2} — ABNORMAL LOW (ref >59)

## 2022-08-30 LAB — LIPID PANEL
Chol/HDL Ratio: 2.2 ratio (ref 0.0–4.4)
Cholesterol, Total: 197 mg/dL (ref 100–199)
HDL: 90 mg/dL (ref >39)
LDL Chol Calc (NIH): 95 mg/dL (ref 0–99)
Triglycerides: 66 mg/dL (ref 0–149)
VLDL Cholesterol Cal: 12 mg/dL (ref 5–40)

## 2022-08-30 LAB — VITAMIN D 25 HYDROXY (VIT D DEFICIENCY, FRACTURES): Vit D, 25-Hydroxy: 36 ng/mL (ref 30.0–100.0)

## 2022-09-06 ENCOUNTER — Ambulatory Visit (INDEPENDENT_AMBULATORY_CARE_PROVIDER_SITE_OTHER): Payer: Medicare HMO

## 2022-09-06 ENCOUNTER — Encounter: Payer: Self-pay | Admitting: Orthopaedic Surgery

## 2022-09-06 ENCOUNTER — Ambulatory Visit (INDEPENDENT_AMBULATORY_CARE_PROVIDER_SITE_OTHER): Payer: Medicare HMO | Admitting: Orthopaedic Surgery

## 2022-09-06 DIAGNOSIS — M1711 Unilateral primary osteoarthritis, right knee: Secondary | ICD-10-CM

## 2022-09-06 NOTE — Progress Notes (Signed)
Office Visit Note   Patient: Cristina Sawyer           Date of Birth: Jul 29, 1941           MRN: [id number] Visit Date: 09/06/2022              Requested by: Denita Lung, MD [place],  [place] [id number] PCP: Denita Lung, MD   Assessment & Plan: Visit Diagnoses:  1. Primary osteoarthritis of right knee     Plan: Impression is severe right knee degenerative joint disease secondary to Osteoarthritis.  Bone on bone joint space narrowing is seen on radiographs with mild valgus alignment.  At this point, conservative treatments fail to provide any significant relief and the pain is severely affecting ADLs and quality of life.  Based on treatment options, the patient has elected to move forward with a knee replacement.  We have discussed the surgical risks that include but are not limited to infection, DVT, leg length discrepancy, stiffness, numbness, tingling, incomplete relief of pain.  Recovery and prognosis were also reviewed.  Cristina Sawyer will call the patient to schedule surgery once we have our necessary clearances.    Anticoagulants: aspirin 81 mg daily Postop anticoagulation: Aspirin 81 mg Diabetic: No  Nickel allergy: No Prior DVT/PE: No Tobacco use: No Clearances needed for surgery: Dr. Redmond [facility] Anticipated discharge dispo: Home with significant other   Follow-Up Instructions: No follow-ups on file.   Orders:  Orders Placed This Encounter  Procedures   XR KNEE 3 VIEW RIGHT   No orders of the defined types were placed in this encounter.     Procedures: No procedures performed   Clinical Data: No additional findings.   Subjective: Chief Complaint  Patient presents with   Right Knee - Pain   Left Knee - Pain    HPI Cristina Sawyer is a very pleasant 81 year old female referral from Dr. Demetrios Isaacs for bilateral knee pain worse on the right.  This has been an ongoing issue for the last 3 to 4 years.  The pain has gotten worse.  She recently saw Dr.  Leda Roys in October and she had a cortisone injection which provided about a month of relief.  She has taken over-the-counter medications such as Advil and Tylenol.  The pain is significantly interfering with ADLs and quality of life. Review of Systems  Constitutional: Negative.   HENT: Negative.    Eyes: Negative.   Respiratory: Negative.    Cardiovascular: Negative.   Endocrine: Negative.   Musculoskeletal: Negative.   Neurological: Negative.   Hematological: Negative.   Psychiatric/Behavioral: Negative.    All other systems reviewed and are negative.    Objective: Vital Signs: There were no vitals taken for this visit.  Physical Exam Vitals and nursing note reviewed.  Constitutional:      Appearance: She is well-developed.  HENT:     Head: Atraumatic.     Nose: Nose normal.  Eyes:     Extraocular Movements: Extraocular movements intact.  Cardiovascular:     Pulses: Normal pulses.  Pulmonary:     Effort: Pulmonary effort is normal.  Abdominal:     Palpations: Abdomen is soft.  Musculoskeletal:     Cervical back: Neck supple.  Skin:    General: Skin is warm.     Capillary Refill: Capillary refill takes less than 2 seconds.  Neurological:     Mental Status: She is alert. Mental status is at baseline.  Psychiatric:  Behavior: Behavior normal.        Thought Content: Thought content normal.        Judgment: Judgment normal.     Ortho Exam Examination of the right knee shows valgus alignment.  Lateral joint line tenderness.  2+ crepitus with range of motion.  Collaterals and cruciates are stable.  No joint effusion. Specialty Comments:  No specialty comments available.  Imaging: No results found.   PMFS History: Patient Active Problem List   Diagnosis Date Noted   History of GI bleed 06/06/2022   Osteopenia 08/30/2021   Aortic atherosclerosis ([place]) 07/12/2021   Diverticulosis 07/12/2021   Leukocytosis 07/05/2021   GAD (generalized anxiety disorder)  07/05/2021   Personal history of calcium pyrophosphate deposition disease (CPPD) 04/11/2020   History of vitamin D deficiency 07/26/2019   Age-related cataract of both eyes 05/19/2018   Hyperlipidemia 05/19/2018   CKD (chronic kidney disease) stage 3, GFR 30-59 ml/min (HCC) 02/14/2018   Essential hypertension 05/12/2017   Transient cerebral ischemia 05/12/2017   Seasonal allergic rhinitis due to pollen 12/29/2014   GERD (gastroesophageal reflux disease) 01/17/2014   Personal history of colonic adenoma 08/09/2008   Past Medical History:  Diagnosis Date   Arthritis    Cataract    Diverticulosis    Dyslipidemia    GERD (gastroesophageal reflux disease)    HTN (hypertension) 2015   Personal history of colonic adenoma 08/09/2008   Vitamin D deficiency     Family History  Problem Relation Age of Onset   Heart disease Sister    Heart disease Sister        in her 25s   Colon cancer Neg Hx     Past Surgical History:  Procedure Laterality Date   ABDOMINAL HYSTERECTOMY  1975   APPENDECTOMY  1975   COLONOSCOPY     HAND SURGERY     Cyst removal   THYROID SURGERY  2000   to remove nodule   Social History   Occupational History   Occupation: Retired from [employer]  Tobacco Use   Smoking status: Never   Smokeless tobacco: Never  Vaping Use   Vaping Use: Never used  Substance and Sexual Activity   Alcohol use: Yes    Alcohol/week: 1.0 standard drink of alcohol    Types: 1 Glasses of wine per week   Drug use: No   Sexual activity: Yes

## 2022-10-24 ENCOUNTER — Other Ambulatory Visit: Payer: Self-pay | Admitting: Family Medicine

## 2022-10-24 DIAGNOSIS — I10 Essential (primary) hypertension: Secondary | ICD-10-CM

## 2022-11-19 ENCOUNTER — Telehealth: Payer: Self-pay | Admitting: Family Medicine

## 2022-11-19 NOTE — Telephone Encounter (Signed)
Niki called in and states Ortho care is waiting on her surgery clearance.

## 2022-11-20 NOTE — Telephone Encounter (Signed)
I called and spoke with patient. She is going to call Ortho care and request that they refax the surgical clearance form to our office.

## 2022-11-21 ENCOUNTER — Other Ambulatory Visit: Payer: Self-pay | Admitting: Family Medicine

## 2022-11-21 DIAGNOSIS — Z8673 Personal history of transient ischemic attack (TIA), and cerebral infarction without residual deficits: Secondary | ICD-10-CM

## 2022-11-22 ENCOUNTER — Telehealth: Payer: Self-pay | Admitting: Family Medicine

## 2022-11-22 NOTE — Telephone Encounter (Signed)
Received pre-operative clearance form from [facility]o [facility]. Sending back with form checklist.

## 2022-11-25 ENCOUNTER — Other Ambulatory Visit: Payer: Self-pay

## 2022-12-11 ENCOUNTER — Other Ambulatory Visit: Payer: Self-pay | Admitting: Physician Assistant

## 2022-12-11 NOTE — Pre-Procedure Instructions (Signed)
Surgical Instructions    Your procedure is scheduled on December 23, 2022.  Report to [facility] Main Entrance "A" at 8:40 A.M., then check in with the Admitting office.  Call this number if you have problems the morning of surgery:  [phone]   If you have any questions prior to your surgery date call [phone]: Open Monday-Friday 8am-4pm If you experience any cold or flu symptoms such as cough, fever, chills, shortness of breath, etc. between now and your scheduled surgery, please notify us at the above number     Remember:  Do not eat after midnight the night before your surgery  You may drink clear liquids until 7:40AM the morning of your surgery.   Clear liquids allowed are: Water, Non-Citrus Juices (without pulp), Carbonated Beverages, Clear Tea, Black Coffee ONLY (NO MILK, CREAM OR POWDERED CREAMER of any kind), and Gatorade  Patient Instructions  The night before surgery:  No food after midnight. ONLY clear liquids after midnight  The day of surgery (if you do NOT have diabetes):  Drink ONE (1) Pre-Surgery Clear Ensure by 7:40AM the morning of surgery. Drink in one sitting. Do not sip.  This drink was given to you during your hospital  pre-op appointment visit.  Nothing else to drink after completing the  Pre-Surgery Clear Ensure.          If you have questions, please contact your surgeon's office.     Take these medicines the morning of surgery with A SIP OF WATER:  amLODipine (NORVASC)  rosuvastatin (CRESTOR)   If needed: cetirizine (ZYRTEC)  pantoprazole (PROTONIX)   Follow your surgeon's instructions on when to stop Aspirin.  If no instructions were given by your surgeon then you will need to call the office to get those instructions.    As of today, STOP taking any Aleve, Naproxen, Ibuprofen, Motrin, Advil, Goody's, BC's, all herbal medications, fish oil, and all vitamins.         [facility] is not responsible for any belongings or valuables.    Do  NOT Smoke (Tobacco/Vaping)  24 hours prior to your procedure  If you use a CPAP at night, you may bring your mask for your overnight stay.   Contacts, glasses, hearing aids, dentures or partials may not be worn into surgery, please bring cases for these belongings   For patients admitted to the hospital, discharge time will be determined by your treatment team.   Patients discharged the day of surgery will not be allowed to drive home, and someone needs to stay with them for 24 hours.   SURGICAL WAITING ROOM VISITATION Patients having surgery or a procedure may have no more than 2 support people in the waiting area - these visitors may rotate.   Children under the age of 49 must have an adult with them who is not the patient. If the patient needs to stay at the hospital during part of their recovery, the visitor guidelines for inpatient rooms apply. Pre-op nurse will coordinate an appropriate time for 1 support person to accompany patient in pre-op.  This support person may not rotate.   Please refer to [email or web address] for the visitor guidelines for Inpatients (after your surgery is over and you are in a regular room).    Special instructions:    Oral Hygiene is also important to reduce your risk of infection.  Remember - BRUSH YOUR TEETH THE MORNING OF SURGERY WITH YOUR REGULAR TOOTHPASTE   [facility]- Preparing For Surgery  Before surgery, you can play an important role. Because skin is not sterile, your skin needs to be as free of germs as possible. You can reduce the number of germs on your skin by washing with CHG (chlorahexidine gluconate) Soap before surgery.  CHG is an antiseptic cleaner which kills germs and bonds with the skin to continue killing germs even after washing.     Please do not use if you have an allergy to CHG or antibacterial soaps. If your skin becomes reddened/irritated stop using the CHG.  Do not shave  (including legs and underarms) for at least 48 hours prior to first CHG shower. It is OK to shave your face.  Please follow these instructions carefully.     Shower the NIGHT BEFORE SURGERY and the MORNING OF SURGERY with CHG Soap.   If you chose to wash your hair, wash your hair first as usual with your normal shampoo. After you shampoo, rinse your hair and body thoroughly to remove the shampoo.  Then [employer] and genitals (private parts) with your normal soap and rinse thoroughly to remove soap.  After that Use CHG Soap as you would any other liquid soap. You can apply CHG directly to the skin and wash gently with a scrungie or a clean washcloth.   Apply the CHG Soap to your body ONLY FROM THE NECK DOWN.  Do not use on open wounds or open sores. Avoid contact with your eyes, ears, mouth and genitals (private parts). Wash Face and genitals (private parts)  with your normal soap.   Wash thoroughly, paying special attention to the area where your surgery will be performed.  Thoroughly rinse your body with warm water from the neck down.  DO NOT shower/wash with your normal soap after using and rinsing off the CHG Soap.  Pat yourself dry with a CLEAN TOWEL.  Wear CLEAN PAJAMAS to bed the night before surgery  Place CLEAN SHEETS on your bed the night before your surgery  DO NOT SLEEP WITH PETS.   Day of Surgery:  Take a shower with CHG soap. Wear Clean/Comfortable clothing the morning of surgery Do not wear jewelry or makeup. Do not wear lotions, powders, perfumes/cologne or deodorant. Do not shave 48 hours prior to surgery.  Men may shave face and neck. Do not bring valuables to the hospital. Do not wear nail polish, gel polish, artificial nails, or any other type of covering on natural nails (fingers and toes) If you have artificial nails or gel coating that need to be removed by a nail salon, please have this removed prior to surgery. Artificial nails or gel coating may interfere  with anesthesia's ability to adequately monitor your vital signs. Remember to brush your teeth WITH YOUR REGULAR TOOTHPASTE.    If you received a COVID test during your pre-op visit, it is requested that you wear a mask when out in public, stay away from anyone that may not be feeling well, and notify your surgeon if you develop symptoms. If you have been in contact with anyone that has tested positive in the last 10 days, please notify your surgeon.    Please read over the following fact sheets that you were given.

## 2022-12-12 ENCOUNTER — Encounter (HOSPITAL_COMMUNITY): Payer: Self-pay

## 2022-12-12 ENCOUNTER — Other Ambulatory Visit: Payer: Self-pay

## 2022-12-12 ENCOUNTER — Encounter (HOSPITAL_COMMUNITY)
Admission: RE | Admit: 2022-12-12 | Discharge: 2022-12-12 | Disposition: A | Discharge status: Home / Self Care | Payer: Medicare HMO | Source: Ambulatory Visit | Attending: Orthopaedic Surgery | Admitting: Orthopaedic Surgery

## 2022-12-12 VITALS — BP 134/63 | HR 89 | Temp 98.2°F | Resp 18 | Ht 63.0 in | Wt 182.6 lb

## 2022-12-12 DIAGNOSIS — Z01818 Encounter for other preprocedural examination: Secondary | ICD-10-CM | POA: Insufficient documentation

## 2022-12-12 DIAGNOSIS — M1711 Unilateral primary osteoarthritis, right knee: Secondary | ICD-10-CM | POA: Insufficient documentation

## 2022-12-12 DIAGNOSIS — R9431 Abnormal electrocardiogram [ECG] [EKG]: Secondary | ICD-10-CM | POA: Insufficient documentation

## 2022-12-12 HISTORY — DX: Chronic kidney disease, unspecified: N18.9

## 2022-12-12 LAB — CBC
HCT: 45.3 % (ref 36.0–46.0)
Hemoglobin: 14.9 g/dL (ref 12.0–15.0)
MCH: 28.2 pg (ref 26.0–34.0)
MCHC: 32.9 g/dL (ref 30.0–36.0)
MCV: 85.8 fL (ref 80.0–100.0)
Platelets: 253 10*3/uL (ref 150–400)
RBC: 5.28 MIL/uL — ABNORMAL HIGH (ref 3.87–5.11)
RDW: 14.5 % (ref 11.5–15.5)
WBC: 9.7 10*3/uL (ref 4.0–10.5)
nRBC: 0 % (ref 0.0–0.2)

## 2022-12-12 LAB — BASIC METABOLIC PANEL
Anion gap: 14 (ref 5–15)
BUN: 12 mg/dL (ref 8–23)
CO2: 23 mmol/L (ref 22–32)
Calcium: 10 mg/dL (ref 8.9–10.3)
Chloride: 102 mmol/L (ref 98–111)
Creatinine, Ser: 1 mg/dL (ref 0.44–1.00)
GFR, Estimated: 56 mL/min — ABNORMAL LOW (ref >60)
Glucose, Bld: 115 mg/dL — ABNORMAL HIGH (ref 70–99)
Potassium: 3.9 mmol/L (ref 3.5–5.1)
Sodium: 139 mmol/L (ref 135–145)

## 2022-12-12 LAB — SURGICAL PCR SCREEN
MRSA, PCR: NEGATIVE
Staphylococcus aureus: NEGATIVE

## 2022-12-12 NOTE — Progress Notes (Signed)
PCP - Dr. Jill Alexanders Cardiologist -   PPM/ICD - n/a  Chest x-ray - n/a EKG - 12/12/22 Stress Test - 02/15/2018 ECHO - 02/15/2018 Cardiac Cath - denies  Sleep Study - denies  Fasting Blood Sugar - n/a  Last dose of GLP1 agonist-  n/a GLP1 instructions: n/a  Blood Thinner Instructions: n/a Aspirin Instructions: As of today stop taking Aspirin unless otherwise instructed by your surgeon. If you have not received instructions then please contact your surgeon's office for instructions.  ERAS Protcol - Clear liquids until 0750 am day of surgery PRE-SURGERY Ensure or G2- Ensure  COVID TEST- n/a   Anesthesia review: Yes. EKG Review  Patient denies shortness of breath, fever, cough and chest pain at PAT appointment   All instructions explained to the patient, with a verbal understanding of the material. Patient agrees to go over the instructions while at home for a better understanding.

## 2022-12-17 ENCOUNTER — Other Ambulatory Visit: Payer: Self-pay | Admitting: Physician Assistant

## 2022-12-17 MED ORDER — METHOCARBAMOL 500 MG PO TABS: 500.0000 mg | ORAL_TABLET | Freq: Two times a day (BID) | ORAL | 2 refills | Status: DC | PRN

## 2022-12-17 MED ORDER — ONDANSETRON HCL 4 MG PO TABS: 4.0000 mg | ORAL_TABLET | Freq: Three times a day (TID) | ORAL | 0 refills | Status: DC | PRN

## 2022-12-17 MED ORDER — DOCUSATE SODIUM 100 MG PO CAPS: 100.0000 mg | ORAL_CAPSULE | Freq: Every day | ORAL | 2 refills | Status: AC | PRN

## 2022-12-17 MED ORDER — OXYCODONE-ACETAMINOPHEN 5-325 MG PO TABS: 1.0000 | ORAL_TABLET | Freq: Four times a day (QID) | ORAL | 0 refills | Status: DC | PRN

## 2022-12-17 MED ORDER — ASPIRIN 81 MG PO TBEC: 81.0000 mg | DELAYED_RELEASE_TABLET | Freq: Two times a day (BID) | ORAL | 0 refills | Status: AC

## 2022-12-20 MED ORDER — TRANEXAMIC ACID 1000 MG/10ML IV SOLN
2000.0000 mg | INTRAVENOUS | Status: DC
  Filled 2022-12-20: qty 20

## 2022-12-22 ENCOUNTER — Telehealth: Payer: Self-pay | Admitting: *Deleted

## 2022-12-22 DIAGNOSIS — M1711 Unilateral primary osteoarthritis, right knee: Secondary | ICD-10-CM | POA: Insufficient documentation

## 2022-12-22 NOTE — Telephone Encounter (Signed)
[facility] pre-op call completed. 

## 2022-12-22 NOTE — Care Plan (Signed)
[facility] RNCM call to patient to discuss her upcoming Right total knee arthroplasty with Dr. Roda Shutters on 12/23/22. She is a [facility] patient and is agreeable to case management. She has a niece that will be assisting her after surgery at discharge. She has a home CPM and RW already through [employer]. She discussed a tub transfer bench due to only having a bathtub/shower combo and will order this herself. Anticipate HHPT will be needed after a short hospital stay. Referral made to [facility] after choice provided. Reviewed all post op care instructions. Will continue to follow for needs.

## 2022-12-23 ENCOUNTER — Ambulatory Visit (HOSPITAL_BASED_OUTPATIENT_CLINIC_OR_DEPARTMENT_OTHER): Payer: Medicare HMO | Admitting: Anesthesiology

## 2022-12-23 ENCOUNTER — Encounter (HOSPITAL_COMMUNITY): Payer: Self-pay | Admitting: Orthopaedic Surgery

## 2022-12-23 ENCOUNTER — Observation Stay (HOSPITAL_COMMUNITY)
Admission: RE | Admit: 2022-12-23 | Discharge: 2022-12-24 | Disposition: A | Discharge status: Home / Self Care | Payer: Medicare HMO | Attending: Orthopaedic Surgery | Admitting: Orthopaedic Surgery

## 2022-12-23 ENCOUNTER — Observation Stay (HOSPITAL_COMMUNITY): Payer: Medicare HMO

## 2022-12-23 ENCOUNTER — Encounter (HOSPITAL_COMMUNITY)
Admission: RE | Disposition: A | Discharge status: Home / Self Care | Payer: Self-pay | Source: Home / Self Care | Attending: Orthopaedic Surgery

## 2022-12-23 ENCOUNTER — Ambulatory Visit (HOSPITAL_COMMUNITY): Payer: Medicare HMO | Admitting: Physician Assistant

## 2022-12-23 ENCOUNTER — Other Ambulatory Visit: Payer: Self-pay

## 2022-12-23 DIAGNOSIS — Z7982 Long term (current) use of aspirin: Secondary | ICD-10-CM | POA: Diagnosis not present

## 2022-12-23 DIAGNOSIS — N189 Chronic kidney disease, unspecified: Secondary | ICD-10-CM | POA: Insufficient documentation

## 2022-12-23 DIAGNOSIS — Z79899 Other long term (current) drug therapy: Secondary | ICD-10-CM | POA: Insufficient documentation

## 2022-12-23 DIAGNOSIS — I129 Hypertensive chronic kidney disease with stage 1 through stage 4 chronic kidney disease, or unspecified chronic kidney disease: Secondary | ICD-10-CM | POA: Diagnosis not present

## 2022-12-23 DIAGNOSIS — F419 Anxiety disorder, unspecified: Secondary | ICD-10-CM | POA: Diagnosis not present

## 2022-12-23 DIAGNOSIS — Z96651 Presence of right artificial knee joint: Secondary | ICD-10-CM

## 2022-12-23 DIAGNOSIS — M1711 Unilateral primary osteoarthritis, right knee: Secondary | ICD-10-CM | POA: Diagnosis not present

## 2022-12-23 DIAGNOSIS — M21061 Valgus deformity, not elsewhere classified, right knee: Secondary | ICD-10-CM | POA: Diagnosis not present

## 2022-12-23 DIAGNOSIS — I10 Essential (primary) hypertension: Secondary | ICD-10-CM | POA: Diagnosis not present

## 2022-12-23 DIAGNOSIS — R69 Illness, unspecified: Secondary | ICD-10-CM | POA: Diagnosis not present

## 2022-12-23 DIAGNOSIS — N289 Disorder of kidney and ureter, unspecified: Secondary | ICD-10-CM | POA: Diagnosis not present

## 2022-12-23 DIAGNOSIS — G8918 Other acute postprocedural pain: Secondary | ICD-10-CM | POA: Diagnosis not present

## 2022-12-23 DIAGNOSIS — Z01818 Encounter for other preprocedural examination: Secondary | ICD-10-CM

## 2022-12-23 HISTORY — PX: TOTAL KNEE ARTHROPLASTY: SHX125

## 2022-12-23 SURGERY — ARTHROPLASTY, KNEE, TOTAL
Anesthesia: Monitor Anesthesia Care | Site: Knee | Laterality: Right

## 2022-12-23 MED ORDER — VANCOMYCIN HCL 1000 MG IV SOLR
INTRAVENOUS | Status: DC | PRN
  Administered 2022-12-23: 1000 mg via TOPICAL

## 2022-12-23 MED ORDER — PROPOFOL 10 MG/ML IV BOLUS
INTRAVENOUS | Status: AC
  Filled 2022-12-23: qty 20

## 2022-12-23 MED ORDER — FENTANYL CITRATE (PF) 100 MCG/2ML IJ SOLN
25.0000 ug | INTRAMUSCULAR | Status: DC | PRN
  Administered 2022-12-23: 25 ug via INTRAVENOUS

## 2022-12-23 MED ORDER — BUPIVACAINE-MELOXICAM ER 400-12 MG/14ML IJ SOLN
INTRAMUSCULAR | Status: DC | PRN
  Administered 2022-12-23: 14 mL

## 2022-12-23 MED ORDER — BUPIVACAINE HCL (PF) 0.5 % IJ SOLN
INTRAMUSCULAR | Status: DC | PRN
  Administered 2022-12-23: 25 mL via PERINEURAL

## 2022-12-23 MED ORDER — 0.9 % SODIUM CHLORIDE (POUR BTL) OPTIME
TOPICAL | Status: DC | PRN
  Administered 2022-12-23: 1000 mL

## 2022-12-23 MED ORDER — PHENYLEPHRINE HCL-NACL 20-0.9 MG/250ML-% IV SOLN
INTRAVENOUS | Status: DC | PRN
  Administered 2022-12-23: 50 ug/min via INTRAVENOUS

## 2022-12-23 MED ORDER — KETOROLAC TROMETHAMINE 15 MG/ML IJ SOLN
7.5000 mg | Freq: Four times a day (QID) | INTRAMUSCULAR | Status: AC
  Administered 2022-12-23 – 2022-12-24 (×3): 7.5 mg via INTRAVENOUS
  Filled 2022-12-23 (×3): qty 1

## 2022-12-23 MED ORDER — METOCLOPRAMIDE HCL 5 MG/ML IJ SOLN: 5.0000 mg | Freq: Three times a day (TID) | INTRAMUSCULAR | Status: DC | PRN

## 2022-12-23 MED ORDER — PHENYLEPHRINE HCL (PRESSORS) 10 MG/ML IV SOLN
INTRAVENOUS | Status: AC
  Filled 2022-12-23: qty 1

## 2022-12-23 MED ORDER — TRANEXAMIC ACID-NACL 1000-0.7 MG/100ML-% IV SOLN
1000.0000 mg | Freq: Once | INTRAVENOUS | Status: AC
  Administered 2022-12-23: 1000 mg via INTRAVENOUS
  Filled 2022-12-23: qty 100

## 2022-12-23 MED ORDER — FERROUS SULFATE 325 (65 FE) MG PO TABS
325.0000 mg | ORAL_TABLET | Freq: Three times a day (TID) | ORAL | Status: DC
  Administered 2022-12-23 – 2022-12-24 (×2): 325 mg via ORAL
  Filled 2022-12-23 (×2): qty 1

## 2022-12-23 MED ORDER — CHLORHEXIDINE GLUCONATE 0.12 % MT SOLN
15.0000 mL | Freq: Once | OROMUCOSAL | Status: AC
  Administered 2022-12-23: 15 mL via OROMUCOSAL
  Filled 2022-12-23: qty 15

## 2022-12-23 MED ORDER — MIDAZOLAM HCL 2 MG/2ML IJ SOLN
INTRAMUSCULAR | Status: AC
  Filled 2022-12-23: qty 2

## 2022-12-23 MED ORDER — METOCLOPRAMIDE HCL 5 MG PO TABS: 5.0000 mg | ORAL_TABLET | Freq: Three times a day (TID) | ORAL | Status: DC | PRN

## 2022-12-23 MED ORDER — TRANEXAMIC ACID-NACL 1000-0.7 MG/100ML-% IV SOLN
1000.0000 mg | INTRAVENOUS | Status: AC
  Administered 2022-12-23: 1000 mg via INTRAVENOUS
  Filled 2022-12-23: qty 100

## 2022-12-23 MED ORDER — SODIUM CHLORIDE 0.9 % IR SOLN
Status: DC | PRN
  Administered 2022-12-23: 1000 mL

## 2022-12-23 MED ORDER — CEFAZOLIN SODIUM-DEXTROSE 2-4 GM/100ML-% IV SOLN
2.0000 g | Freq: Four times a day (QID) | INTRAVENOUS | Status: AC
  Administered 2022-12-23 (×2): 2 g via INTRAVENOUS
  Filled 2022-12-23 (×2): qty 100

## 2022-12-23 MED ORDER — TRANEXAMIC ACID 1000 MG/10ML IV SOLN
INTRAVENOUS | Status: DC | PRN
  Administered 2022-12-23: 2000 mg via TOPICAL

## 2022-12-23 MED ORDER — DEXAMETHASONE SODIUM PHOSPHATE 10 MG/ML IJ SOLN
10.0000 mg | Freq: Once | INTRAMUSCULAR | Status: AC
  Administered 2022-12-24: 10 mg via INTRAVENOUS
  Filled 2022-12-23: qty 1

## 2022-12-23 MED ORDER — ONDANSETRON HCL 4 MG PO TABS
4.0000 mg | ORAL_TABLET | Freq: Four times a day (QID) | ORAL | Status: DC | PRN
  Administered 2022-12-24: 4 mg via ORAL
  Filled 2022-12-23: qty 1

## 2022-12-23 MED ORDER — VANCOMYCIN HCL 1000 MG IV SOLR
INTRAVENOUS | Status: AC
  Filled 2022-12-23: qty 20

## 2022-12-23 MED ORDER — METHOCARBAMOL 500 MG PO TABS
500.0000 mg | ORAL_TABLET | Freq: Four times a day (QID) | ORAL | Status: DC | PRN
  Administered 2022-12-23 (×2): 500 mg via ORAL
  Filled 2022-12-23 (×2): qty 1

## 2022-12-23 MED ORDER — CEFAZOLIN SODIUM-DEXTROSE 2-4 GM/100ML-% IV SOLN
2.0000 g | INTRAVENOUS | Status: AC
  Administered 2022-12-23: 2 g via INTRAVENOUS
  Filled 2022-12-23: qty 100

## 2022-12-23 MED ORDER — ORAL CARE MOUTH RINSE: 15.0000 mL | Freq: Once | OROMUCOSAL | Status: AC

## 2022-12-23 MED ORDER — OXYCODONE HCL 5 MG PO TABS: 5.0000 mg | ORAL_TABLET | ORAL | Status: DC | PRN

## 2022-12-23 MED ORDER — POVIDONE-IODINE 10 % EX SWAB
2.0000 | Freq: Once | CUTANEOUS | Status: AC
  Administered 2022-12-23: 2 via TOPICAL

## 2022-12-23 MED ORDER — FENTANYL CITRATE (PF) 100 MCG/2ML IJ SOLN
INTRAMUSCULAR | Status: AC
  Filled 2022-12-23: qty 2

## 2022-12-23 MED ORDER — HYDROMORPHONE HCL 1 MG/ML IJ SOLN
0.5000 mg | INTRAMUSCULAR | Status: DC | PRN
  Administered 2022-12-23 (×2): 1 mg via INTRAVENOUS
  Filled 2022-12-23 (×2): qty 1

## 2022-12-23 MED ORDER — ACETAMINOPHEN 500 MG PO TABS
1000.0000 mg | ORAL_TABLET | Freq: Four times a day (QID) | ORAL | Status: DC
  Administered 2022-12-23 – 2022-12-24 (×3): 1000 mg via ORAL
  Filled 2022-12-23 (×3): qty 2

## 2022-12-23 MED ORDER — LACTATED RINGERS IV SOLN: INTRAVENOUS | Status: DC

## 2022-12-23 MED ORDER — BUPIVACAINE IN DEXTROSE 0.75-8.25 % IT SOLN
INTRATHECAL | Status: DC | PRN
  Administered 2022-12-23: 1.6 mL via INTRATHECAL

## 2022-12-23 MED ORDER — METHOCARBAMOL 1000 MG/10ML IJ SOLN: 500.0000 mg | Freq: Four times a day (QID) | INTRAVENOUS | Status: DC | PRN

## 2022-12-23 MED ORDER — BUPIVACAINE-MELOXICAM ER 400-12 MG/14ML IJ SOLN
INTRAMUSCULAR | Status: AC
  Filled 2022-12-23: qty 1

## 2022-12-23 MED ORDER — PROPOFOL 500 MG/50ML IV EMUL
INTRAVENOUS | Status: DC | PRN
  Administered 2022-12-23: 50 ug/kg/min via INTRAVENOUS

## 2022-12-23 MED ORDER — FENTANYL CITRATE (PF) 100 MCG/2ML IJ SOLN
INTRAMUSCULAR | Status: AC
  Administered 2022-12-23: 50 ug via INTRAVENOUS
  Filled 2022-12-23: qty 2

## 2022-12-23 MED ORDER — ASPIRIN 81 MG PO CHEW
81.0000 mg | CHEWABLE_TABLET | Freq: Two times a day (BID) | ORAL | Status: DC
  Administered 2022-12-23 – 2022-12-24 (×2): 81 mg via ORAL
  Filled 2022-12-23 (×2): qty 1

## 2022-12-23 MED ORDER — OXYCODONE HCL 5 MG PO TABS: 5.0000 mg | ORAL_TABLET | Freq: Once | ORAL | Status: DC | PRN

## 2022-12-23 MED ORDER — OXYCODONE HCL ER 10 MG PO T12A
10.0000 mg | EXTENDED_RELEASE_TABLET | Freq: Two times a day (BID) | ORAL | Status: DC
  Administered 2022-12-23 (×2): 10 mg via ORAL
  Filled 2022-12-23 (×3): qty 1

## 2022-12-23 MED ORDER — ACETAMINOPHEN 325 MG PO TABS: 325.0000 mg | ORAL_TABLET | Freq: Four times a day (QID) | ORAL | Status: DC | PRN

## 2022-12-23 MED ORDER — MENTHOL 3 MG MT LOZG: 1.0000 | LOZENGE | OROMUCOSAL | Status: DC | PRN

## 2022-12-23 MED ORDER — ONDANSETRON HCL 4 MG/2ML IJ SOLN
4.0000 mg | Freq: Four times a day (QID) | INTRAMUSCULAR | Status: AC | PRN
  Administered 2022-12-23: 4 mg via INTRAVENOUS

## 2022-12-23 MED ORDER — FENTANYL CITRATE (PF) 100 MCG/2ML IJ SOLN: 50.0000 ug | Freq: Once | INTRAMUSCULAR | Status: AC

## 2022-12-23 MED ORDER — ONDANSETRON HCL 4 MG/2ML IJ SOLN
4.0000 mg | Freq: Four times a day (QID) | INTRAMUSCULAR | Status: DC | PRN
  Administered 2022-12-23: 4 mg via INTRAVENOUS
  Filled 2022-12-23: qty 2

## 2022-12-23 MED ORDER — DOCUSATE SODIUM 100 MG PO CAPS
100.0000 mg | ORAL_CAPSULE | Freq: Two times a day (BID) | ORAL | Status: DC
  Administered 2022-12-23 – 2022-12-24 (×3): 100 mg via ORAL
  Filled 2022-12-23 (×3): qty 1

## 2022-12-23 MED ORDER — ONDANSETRON HCL 4 MG/2ML IJ SOLN
INTRAMUSCULAR | Status: AC
  Filled 2022-12-23: qty 2

## 2022-12-23 MED ORDER — PHENOL 1.4 % MT LIQD: 1.0000 | OROMUCOSAL | Status: DC | PRN

## 2022-12-23 MED ORDER — OXYCODONE HCL 5 MG PO TABS
10.0000 mg | ORAL_TABLET | ORAL | Status: DC | PRN
  Administered 2022-12-23: 10 mg via ORAL
  Filled 2022-12-23 (×2): qty 2

## 2022-12-23 MED ORDER — OXYCODONE HCL 5 MG/5ML PO SOLN: 5.0000 mg | Freq: Once | ORAL | Status: DC | PRN

## 2022-12-23 MED ORDER — AMLODIPINE BESYLATE 10 MG PO TABS
10.0000 mg | ORAL_TABLET | Freq: Every day | ORAL | Status: DC
  Administered 2022-12-24: 10 mg via ORAL
  Filled 2022-12-23: qty 1

## 2022-12-23 MED ORDER — PRONTOSAN WOUND IRRIGATION OPTIME
TOPICAL | Status: DC | PRN
  Administered 2022-12-23: 1 via TOPICAL

## 2022-12-23 MED ORDER — SODIUM CHLORIDE 0.9 % IV SOLN: INTRAVENOUS | Status: DC

## 2022-12-23 SURGICAL SUPPLY — 89 items
ADH SKN CLS APL DERMABOND .7 (GAUZE/BANDAGES/DRESSINGS) ×1
ALCOHOL 70% 16 OZ (MISCELLANEOUS) ×1 IMPLANT
BAG COUNTER SPONGE SURGICOUNT (BAG) IMPLANT
BAG DECANTER FOR FLEXI CONT (MISCELLANEOUS) ×1 IMPLANT
BAG SPNG CNTER NS LX DISP (BAG)
BANDAGE ESMARK 6X9 LF (GAUZE/BANDAGES/DRESSINGS) IMPLANT
BLADE SAG 18X100X1.27 (BLADE) ×1 IMPLANT
BLADE SAW SGTL 13X75X1.27 (BLADE) IMPLANT
BLADE SAW SGTL 73X25 THK (BLADE) ×1 IMPLANT
BNDG CMPR 9X6 STRL LF SNTH (GAUZE/BANDAGES/DRESSINGS)
BNDG ESMARK 6X9 LF (GAUZE/BANDAGES/DRESSINGS)
BOWL SMART MIX CTS (DISPOSABLE) ×1 IMPLANT
BSPLAT TIB 5D F CMNT STM RT (Knees) ×1 IMPLANT
CEMENT BONE REFOBACIN R1X40 US (Cement) IMPLANT
CLSR STERI-STRIP ANTIMIC 1/2X4 (GAUZE/BANDAGES/DRESSINGS) ×2 IMPLANT
COMP FEM CMT PS STD 4 RT (Joint) ×1 IMPLANT
COMPONENT FEM CMT PS STD 4 RT (Joint) IMPLANT
COOLER ICEMAN CLASSIC (MISCELLANEOUS) ×1 IMPLANT
COVER SURGICAL LIGHT HANDLE (MISCELLANEOUS) ×1 IMPLANT
CUFF TOURN SGL QUICK 34 (TOURNIQUET CUFF) ×1
CUFF TOURN SGL QUICK 42 (TOURNIQUET CUFF) IMPLANT
CUFF TRNQT CYL 34X4.125X (TOURNIQUET CUFF) ×1 IMPLANT
DERMABOND ADVANCED .7 DNX12 (GAUZE/BANDAGES/DRESSINGS) ×1 IMPLANT
DRAPE EXTREMITY T 121X128X90 (DISPOSABLE) ×1 IMPLANT
DRAPE HALF SHEET 40X57 (DRAPES) ×1 IMPLANT
DRAPE INCISE IOBAN 66X45 STRL (DRAPES) ×1 IMPLANT
DRAPE ORTHO SPLIT 77X108 STRL (DRAPES)
DRAPE POUCH INSTRU U-SHP 10X18 (DRAPES) ×1 IMPLANT
DRAPE SURG ORHT 6 SPLT 77X108 (DRAPES) IMPLANT
DRAPE U-SHAPE 47X51 STRL (DRAPES) ×2 IMPLANT
DRSG AQUACEL AG ADV 3.5X10 (GAUZE/BANDAGES/DRESSINGS) ×1 IMPLANT
DURAPREP 26ML APPLICATOR (WOUND CARE) ×3 IMPLANT
ELECT CAUTERY BLADE 6.4 (BLADE) ×1 IMPLANT
ELECT PENCIL ROCKER SW 15FT (MISCELLANEOUS) ×1 IMPLANT
ELECT REM PT RETURN 9FT ADLT (ELECTROSURGICAL) ×1
ELECTRODE REM PT RTRN 9FT ADLT (ELECTROSURGICAL) ×1 IMPLANT
GLOVE BIOGEL PI IND STRL 7.0 (GLOVE) ×2 IMPLANT
GLOVE BIOGEL PI IND STRL 7.5 (GLOVE) ×5 IMPLANT
GLOVE ECLIPSE 7.0 STRL STRAW (GLOVE) ×3 IMPLANT
GLOVE INDICATOR 7.0 STRL GRN (GLOVE) ×1 IMPLANT
GLOVE INDICATOR 7.5 STRL GRN (GLOVE) ×1 IMPLANT
GLOVE SURG SYN 7.5  E (GLOVE) ×2
GLOVE SURG SYN 7.5 E (GLOVE) ×2 IMPLANT
GLOVE SURG SYN 7.5 PF PI (GLOVE) ×2 IMPLANT
GLOVE SURG UNDER LTX SZ7.5 (GLOVE) ×2 IMPLANT
GLOVE SURG UNDER POLY LF SZ7 (GLOVE) ×2 IMPLANT
GOWN STRL REUS W/ TWL LRG LVL3 (GOWN DISPOSABLE) ×1 IMPLANT
GOWN STRL REUS W/TWL LRG LVL3 (GOWN DISPOSABLE) ×1
GOWN STRL SURGICAL XL XLNG (GOWN DISPOSABLE) ×1 IMPLANT
GOWN TOGA ZIPPER T7+ PEEL AWAY (MISCELLANEOUS) ×2 IMPLANT
HANDPIECE INTERPULSE COAX TIP (DISPOSABLE) ×1
HDLS TROCR DRIL PIN KNEE 75 (PIN) ×1
HOOD PEEL AWAY T7 (MISCELLANEOUS) ×1 IMPLANT
INSERT TIB ASF PS 14 4-5/EF RT (Insert) IMPLANT
KIT BASIN OR (CUSTOM PROCEDURE TRAY) ×1 IMPLANT
KIT TURNOVER KIT B (KITS) ×1 IMPLANT
MANIFOLD NEPTUNE II (INSTRUMENTS) ×1 IMPLANT
MARKER SKIN DUAL TIP RULER LAB (MISCELLANEOUS) ×2 IMPLANT
NDL SPNL 18GX3.5 QUINCKE PK (NEEDLE) ×1 IMPLANT
NEEDLE SPNL 18GX3.5 QUINCKE PK (NEEDLE) ×1 IMPLANT
NS IRRIG 1000ML POUR BTL (IV SOLUTION) ×1 IMPLANT
PACK TOTAL JOINT (CUSTOM PROCEDURE TRAY) ×1 IMPLANT
PAD ARMBOARD 7.5X6 YLW CONV (MISCELLANEOUS) ×2 IMPLANT
PAD COLD SHLDR WRAP-ON (PAD) ×1 IMPLANT
PIN DRILL HDLS TROCAR 75 4PK (PIN) IMPLANT
SCREW FEMALE HEX FIX 25X2.5 (ORTHOPEDIC DISPOSABLE SUPPLIES) IMPLANT
SET HNDPC FAN SPRY TIP SCT (DISPOSABLE) ×1 IMPLANT
SOLUTION PRONTOSAN WOUND 350ML (IRRIGATION / IRRIGATOR) ×1 IMPLANT
STAPLER VISISTAT 35W (STAPLE) IMPLANT
STEM POLY PAT PLY 32M KNEE (Knees) IMPLANT
STEM TIB ST PERS 14+30 (Stem) IMPLANT
STEM TIBIA 5 DEG SZ F R KNEE (Knees) IMPLANT
SUCTION FRAZIER HANDLE 10FR (MISCELLANEOUS) ×1
SUCTION TUBE FRAZIER 10FR DISP (MISCELLANEOUS) ×1 IMPLANT
SUT ETHILON 2 0 FS 18 (SUTURE) IMPLANT
SUT MNCRL AB 3-0 PS2 27 (SUTURE) IMPLANT
SUT VIC AB 0 CT1 27 (SUTURE) ×3
SUT VIC AB 0 CT1 27XBRD ANBCTR (SUTURE) ×2 IMPLANT
SUT VIC AB 1 CTX 27 (SUTURE) ×3 IMPLANT
SUT VIC AB 2-0 CT1 27 (SUTURE) ×4
SUT VIC AB 2-0 CT1 TAPERPNT 27 (SUTURE) ×4 IMPLANT
SYR 50ML LL SCALE MARK (SYRINGE) ×2 IMPLANT
TIBIA STEM 5 DEG SZ F R KNEE (Knees) ×1 IMPLANT
TOWEL GREEN STERILE (TOWEL DISPOSABLE) ×1 IMPLANT
TOWEL GREEN STERILE FF (TOWEL DISPOSABLE) ×1 IMPLANT
TRAY CATH INTERMITTENT SS 16FR (CATHETERS) IMPLANT
TUBE SUCT ARGYLE STRL (TUBING) ×1 IMPLANT
UNDERPAD 30X36 HEAVY ABSORB (UNDERPADS AND DIAPERS) ×1 IMPLANT
YANKAUER SUCT BULB TIP NO VENT (SUCTIONS) ×2 IMPLANT

## 2022-12-23 NOTE — Op Note (Signed)
Total Knee Arthroplasty Procedure Note  Preoperative diagnosis: Right knee osteoarthritis  Postoperative diagnosis:same  Operative findings: Severe DJD with valgus deformity with osteopenic bone  Operative procedure: Right total knee arthroplasty. CPT [id number]  Surgeon: N. Glee Arvin, MD  Assist: Oneal Grout, PA-C; necessary for the timely completion of procedure and due to complexity of procedure.  Anesthesia: Spinal, regional, local  Tourniquet time: see anesthesia record  Implants used: Zimmer persona cemented Femur: CR 4 Tibia: F with 30 mm cemented stem Patella: 32 mm Polyethylene: 14 mm, medial congruent  Indication: Cristina Sawyer is a 82 y.o. year old female with a history of knee pain. Having failed conservative management, the patient elected to proceed with a total knee arthroplasty.  We have reviewed the risk and benefits of the surgery and they elected to proceed after voicing understanding.  Procedure:  After informed consent was obtained and understanding of the risk were voiced including but not limited to bleeding, infection, damage to surrounding structures including nerves and vessels, blood clots, leg length inequality and the failure to achieve desired results, the operative extremity was marked with verbal confirmation of the patient in the holding area.   The patient was then brought to the operating room and transported to the operating room table in the supine position.  A tourniquet was applied to the operative extremity around the upper thigh. The operative limb was then prepped and draped in the usual sterile fashion and preoperative antibiotics were administered.  A time out was performed prior to the start of surgery confirming the correct extremity, preoperative antibiotic administration, as well as team members, implants and instruments available for the case. Correct surgical site was also confirmed with preoperative radiographs. The limb  was then elevated for exsanguination and the tourniquet was inflated. A midline incision was made and a standard medial parapatellar approach was performed.  The infrapatellar fat pad was removed.  Suprapatellar synovium was removed to reveal the anterior distal femoral cortex.  A medial peel was performed to release the capsule of the medial tibial plateau.  The patella was then everted and was prepared and sized to a 32 mm.  A cover was placed on the patella for protection from retractors.  The knee was then brought into full flexion and we then turned our attention to the femur.  The cruciates were sacrificed.  Start site was drilled in the femur and the intramedullary distal femoral cutting guide was placed, set at 5 degrees valgus, taking 10 mm of distal resection. The distal cut was made. Osteophytes were then removed.  Next, the proximal tibial cutting guide was placed with appropriate slope, varus/valgus alignment and depth of resection. The proximal tibial cut was made taking 2 mm off the low side. Gap blocks were then used to assess the extension gap and alignment, and appropriate soft tissue releases were performed. Attention was turned back to the femur, which was sized using the sizing guide to a size 4. Appropriate rotation of the femoral component was determined using epicondylar axis, Whiteside's line, and assessing the flexion gap under ligament tension. The appropriate size 4-in-1 cutting block was placed and checked with an angel wing and cuts were made. Posterior femoral osteophytes and uncapped bone were then removed with the curved osteotome.  Trial components were placed, and stability was checked in full extension, mid-flexion, and deep flexion. Proper tibial rotation was determined and marked.  The patella tracked well without a lateral release.  The femoral lugs were  then drilled. Trial components were then removed and tibial preparation performed.  The tibia was sized for a size F  component.   The bony surfaces were irrigated with a pulse lavage and then dried. Bone cement was vacuum mixed on the back table, and the final components sized above were cemented into place.  Antibiotic irrigation was placed in the knee joint and soft tissues while the cement cured.  After cement had finished curing, excess cement was removed. The stability of the construct was re-evaluated throughout a range of motion and found to be acceptable. The trial liner was removed, the knee was copiously irrigated, and the knee was re-evaluated for any excess bone debris. The real polyethylene liner, 14 mm thick, was inserted and checked to ensure the locking mechanism had engaged appropriately. The tourniquet was deflated and hemostasis was achieved. The wound was irrigated with normal saline.  One gram of vancomycin powder was placed in the surgical bed.  Topical 0.25% bupivacaine and meloxicam was placed in the joint for postoperative pain.  Capsular closure was performed with a #1 vicryl, subcutaneous fat closed with a 0 vicryl suture, then subcutaneous tissue closed with interrupted 2.0 vicryl suture. The skin was then closed with a 2.0 nylon and dermabond. A sterile dressing was applied.  The patient was awakened in the operating room and taken to recovery in stable condition. All sponge, needle, and instrument counts were correct at the end of the case.  [name] was necessary for opening, closing, retracting, limb positioning and overall facilitation and completion of the surgery.  Position: supine  Complications: none.  Time Out: performed   Drains/Packing: none  Estimated blood loss: minimal  Returned to Recovery Room: in good condition.   Antibiotics: yes   Mechanical VTE (DVT) Prophylaxis: sequential compression devices, TED thigh-high  Chemical VTE (DVT) Prophylaxis: aspirin  Fluid Replacement  Crystalloid: see anesthesia record Blood: none  FFP: none   Specimens Removed: 1  to pathology   Sponge and Instrument Count Correct? yes   PACU: portable radiograph - knee AP and Lateral   Plan/RTC: Return in 2 weeks for wound check.   Weight Bearing/Load Lower Extremity: full   Implant Name Type Inv. Item Serial No. Manufacturer Lot No. LRB No. Used Action  CEMENT BONE REFOBACIN [id number] [place] - [id number] Cement CEMENT BONE REFOBACIN [id number] [place]  ZIMMER RECON(ORTH,TRAU,BIO,SG) [id number] Right 2 Implanted  STEM POLY PAT PLY 20M KNEE - [id number] Knees STEM POLY PAT PLY 20M KNEE  ZIMMER RECON(ORTH,TRAU,BIO,SG) [id number] Right 1 Implanted  STEM TIB ST PERS 14+30 - [id number] Stem STEM TIB ST PERS 14+30  ZIMMER RECON(ORTH,TRAU,BIO,SG) [id number] Right 1 Implanted  COMP FEM CMT PS STD 4 RT - [id number] Joint COMP FEM CMT PS STD 4 RT  ZIMMER RECON(ORTH,TRAU,BIO,SG) [id number] Right 1 Implanted  TIBIA STEM 5 DEG SZ F R KNEE - [id number] Knees TIBIA STEM 5 DEG SZ F R KNEE  ZIMMER RECON(ORTH,TRAU,BIO,SG) [id number] Right 1 Implanted  INSERT TIB ASF PS 14 4-5/EF RT - [id number] Insert INSERT TIB ASF PS 14 4-5/EF RT  ZIMMER RECON(ORTH,TRAU,BIO,SG) [id number] N Right 1 Implanted    N. Glee Arvin, MD [facility] 11:59 AM

## 2022-12-23 NOTE — Progress Notes (Signed)
Last minute vitals not collected due to OR being ready and patient rolling back. VSS during handoff.

## 2022-12-23 NOTE — Anesthesia Procedure Notes (Addendum)
Anesthesia Regional Block: Adductor canal block   Pre-Anesthetic Checklist: , timeout performed,  Correct Patient, Correct Site, Correct Laterality,  Correct Procedure, Correct Position, site marked,  Risks and benefits discussed,  Surgical consent,  Pre-op evaluation,  At surgeon's request and post-op pain management  Laterality: Right  Prep: chloraprep       Needles:  Injection technique: Single-shot  Needle Type: Echogenic Needle     Needle Length: 9cm  Needle Gauge: 21     Additional Needles:   Narrative:  Start time: 12/23/2022 10:05 AM End time: 12/23/2022 10:11 AM Injection made incrementally with aspirations every 5 mL.  Performed by: Personally  Anesthesiologist: Achille Rich, MD  Additional Notes: Pt tolerated the procedure well.

## 2022-12-23 NOTE — Evaluation (Signed)
Physical Therapy Evaluation Patient Details Name: Cristina Sawyer MRN: [id number] DOB: 06/24/1941 Today's Date: 12/23/2022  History of Present Illness  82 y.o. female presents to [facility] hospital on 12/23/2022 for elective R TKA. PMH includes OA, cataract, CKD, diverticulosis, dyslipidemia, HTN, GERD.  Clinical Impression  Pt presents to with deficits in functional mobility, balance, strength, power, ROM. Pt is limited by reports of significant pain despite premedication. Pt requires assistance for bed mobility and transfers, only side stepping at bedside due to reports of pain. Pt is encouraged to attempt to mobilize again this evening with nursing staff. PT provides education on HEP and the need to attempt to maintain knee extension when resting. Pt will benefit from further PT sessions tomorrow in an effort to progress mobility.       Recommendations for follow up therapy are one component of a multi-disciplinary discharge planning process, led by the attending physician.  Recommendations may be updated based on patient status, additional functional criteria and insurance authorization.  Follow Up Recommendations       Assistance Recommended at Discharge Intermittent Supervision/Assistance  Patient can return home with the following  A little help with walking and/or transfers;A lot of help with bathing/dressing/bathroom;Assistance with cooking/housework;Assist for transportation;Help with stairs or ramp for entrance    Equipment Recommendations BSC/3in1  Recommendations for Other Services       Functional Status Assessment Patient has had a recent decline in their functional status and demonstrates the ability to make significant improvements in function in a reasonable and predictable amount of time.     Precautions / Restrictions Precautions Precautions: Fall;Knee Precaution Booklet Issued: Yes (comment) Restrictions Weight Bearing Restrictions: Yes RLE Weight Bearing: Weight bearing  as tolerated      Mobility  Bed Mobility Overal bed mobility: Needs Assistance Bed Mobility: Supine to Sit, Sit to Supine     Supine to sit: Min assist Sit to supine: Min assist        Transfers Overall transfer level: Needs assistance Equipment used: Rolling walker (2 wheels) Transfers: Sit to/from Stand Sit to Stand: Min assist                Ambulation/Gait Ambulation/Gait assistance: Min guard Gait Distance (Feet): 2 Feet Assistive device: Rolling walker (2 wheels) Gait Pattern/deviations: Step-to pattern Gait velocity: reduced Gait velocity interpretation: <1.31 ft/sec, indicative of household ambulator   General Gait Details: side step to left toward head of bed  Stairs            Wheelchair Mobility    Modified Rankin (Stroke Patients Only)       Balance Overall balance assessment: Needs assistance Sitting-balance support: No upper extremity supported, Feet supported Sitting balance-Leahy Scale: Good     Standing balance support: Bilateral upper extremity supported, Reliant on assistive device for balance Standing balance-Leahy Scale: Poor                               Pertinent Vitals/Pain Pain Assessment Pain Assessment: Faces Faces Pain Scale: Hurts whole lot Pain Location: R knee Pain Descriptors / Indicators: Sharp Pain Intervention(s): Premedicated before session    Home Living Family/patient expects to be discharged to:: Private residence Living Arrangements: Alone Available Help at Discharge: Friend(s);Available 24 hours/day Type of Home: House Home Access: Stairs to enter Entrance Stairs-Rails: None Entrance Stairs-Number of Steps: 1   Home Layout: One level Home Equipment: Agricultural consultant (2 wheels);Shower seat  Prior Function Prior Level of Function : Independent/Modified Independent;Driving                     Hand Dominance        Extremity/Trunk Assessment   Upper Extremity  Assessment Upper Extremity Assessment: Overall WFL for tasks assessed    Lower Extremity Assessment Lower Extremity Assessment: RLE deficits/detail RLE Deficits / Details: post-op knee ROM deficits as expected post-TKA, 4-/5 ankle PF/DF, formal knee and hip ROM/strength assessment deferred due to pain    Cervical / Trunk Assessment Cervical / Trunk Assessment: Normal  Communication   Communication: No difficulties  Cognition Arousal/Alertness: Awake/alert Behavior During Therapy: WFL for tasks assessed/performed Overall Cognitive Status: Within Functional Limits for tasks assessed                                          General Comments General comments (skin integrity, edema, etc.): VSS on RA    Exercises Other Exercises Other Exercises: PT provides verbal instruction and demonstration on TKA exercise handout   Assessment/Plan    PT Assessment Patient needs continued PT services  PT Problem List Decreased range of motion;Decreased strength;Decreased activity tolerance;Decreased balance;Decreased mobility;Decreased knowledge of use of DME;Pain       PT Treatment Interventions DME instruction;Gait training;Stair training;Functional mobility training;Therapeutic activities;Therapeutic exercise;Neuromuscular re-education;Balance training;Patient/family education    PT Goals (Current goals can be found in the Care Plan section)  Acute Rehab PT Goals Patient Stated Goal: to reduce pain and go home PT Goal Formulation: With patient Time For Goal Achievement: 12/27/22 Potential to Achieve Goals: Fair    Frequency 7X/week     Co-evaluation               AM-PAC PT "6 Clicks" Mobility  Outcome Measure Help needed turning from your back to your side while in a flat bed without using bedrails?: A Little Help needed moving from lying on your back to sitting on the side of a flat bed without using bedrails?: A Little Help needed moving to and from a bed  to a chair (including a wheelchair)?: A Little Help needed standing up from a chair using your arms (e.g., wheelchair or bedside chair)?: A Little Help needed to walk in hospital room?: Total Help needed climbing 3-5 steps with a railing? : Total 6 Click Score: 14    End of Session   Activity Tolerance: Patient limited by pain Patient left: in bed;with call bell/phone within reach Nurse Communication: Mobility status PT Visit Diagnosis: Other abnormalities of gait and mobility (R26.89);Muscle weakness (generalized) (M62.81);Pain Pain - Right/Left: Right Pain - part of body: Knee    Time: 1630-1710 PT Time Calculation (min) (ACUTE ONLY): 40 min   Charges:   PT Evaluation $PT Eval Low Complexity: 1 Low          Arlyss Gandy, PT, DPT Acute Rehabilitation Office [phone]   Arlyss Gandy 12/23/2022, 5:20 PM

## 2022-12-23 NOTE — H&P (Signed)
PREOPERATIVE H&P  Chief Complaint: RIGHT KNEE OSTEOARTHRITIS  HPI: Cristina Sawyer is a 82 y.o. female who presents for surgical treatment of RIGHT KNEE OSTEOARTHRITIS.  She denies any changes in medical history.  Past Medical History:  Diagnosis Date   Arthritis    Cataract    Chronic kidney disease    Diverticulosis    Dyslipidemia    GERD (gastroesophageal reflux disease)    HTN (hypertension) 2015   Personal history of colonic adenoma 08/09/2008   Vitamin D deficiency    Past Surgical History:  Procedure Laterality Date   ABDOMINAL HYSTERECTOMY  1975   APPENDECTOMY  1975   COLONOSCOPY     HAND SURGERY     Cyst removal   THYROID SURGERY  2000   to remove nodule   Social History   Socioeconomic History   Marital status: Widowed    Spouse name: Not on file   Number of children: Not on file   Years of education: Not on file   Highest education level: Not on file  Occupational History   Occupation: Retired from Community education officer and [employer]  Tobacco Use   Smoking status: Never   Smokeless tobacco: Never  Vaping Use   Vaping Use: Never used  Substance and Sexual Activity   Alcohol use: Yes    Alcohol/week: 1.0 standard drink of alcohol    Types: 1 Glasses of wine per week    Comment: socially   Drug use: No   Sexual activity: Yes  Other Topics Concern   Not on file  Social History Narrative   Pt lives alone.    Social Determinants of Health   Financial Resource Strain: Not on file  Food Insecurity: Not on file  Transportation Needs: Not on file  Physical Activity: Not on file  Stress: Not on file  Social Connections: Not on file   Family History  Problem Relation Age of Onset   Heart disease Sister    Heart disease Sister        in her 43s   Colon cancer Neg Hx    Allergies  Allergen Reactions   Codeine Itching and Nausea Only   Ciprofloxacin Hcl Rash   Prior to Admission medications   Medication Sig Start Date End Date Taking? Authorizing  Provider  amLODipine (NORVASC) 10 MG tablet TAKE 1 TABLET BY MOUTH EVERY DAY 10/24/22  Yes Ronnald Nian, MD  aspirin EC 81 MG tablet Take 81 mg by mouth daily.   Yes [provider]  aspirin EC 81 MG tablet Take 1 tablet (81 mg total) by mouth 2 (two) times daily. To be taken after surgery to prevent blood clots 12/17/22 12/17/23  Cristie Hem, PA-C  cetirizine (ZYRTEC) 10 MG tablet Take 10 mg by mouth daily as needed for allergies.   Yes [provider]  docusate sodium (COLACE) 100 MG capsule Take 1 capsule (100 mg total) by mouth daily as needed. 12/17/22 12/17/23  Cristie Hem, PA-C  ibuprofen (ADVIL) 200 MG tablet Take 400 mg by mouth every 6 (six) hours as needed for moderate pain.   Yes [provider]  methocarbamol (ROBAXIN) 500 MG tablet Take 1 tablet (500 mg total) by mouth 2 (two) times daily as needed. 12/17/22   Cristie Hem, PA-C  Multiple Vitamin (MULTI-VITAMIN DAILY PO) Take 1 tablet by mouth 3 (three) times a week.   Yes [provider]  ondansetron (ZOFRAN) 4 MG tablet Take 1 tablet (4  mg total) by mouth every 8 (eight) hours as needed for nausea or vomiting. 12/17/22   Cristie Hem, PA-C  oxyCODONE-acetaminophen (PERCOCET) 5-325 MG tablet Take 1-2 tablets by mouth every 6 (six) hours as needed. To be taken after surgery 12/17/22   Cristie Hem, PA-C  rosuvastatin (CRESTOR) 5 MG tablet TAKE 1 TABLET (5 MG TOTAL) BY MOUTH DAILY. 11/21/22  Yes Ronnald Nian, MD  trolamine salicylate (ASPERCREME) 10 % cream Apply 1 Application topically as needed for muscle pain.   Yes [provider]  VITAMIN D PO Take 2,000 Units by mouth daily.   Yes [provider]  albuterol (VENTOLIN HFA) 108 (90 Base) MCG/ACT inhaler Inhale 1-2 puffs into the lungs every 6 (six) hours as needed for wheezing or shortness of breath. Patient not taking: Reported on 12/10/2022 09/11/21   Tanda Rockers A, DO  celecoxib (CELEBREX) 200 MG capsule Take 1  capsule (200 mg total) by mouth 2 (two) times daily. Patient not taking: Reported on 12/10/2022 06/06/22   Ronnald Nian, MD  dextromethorphan (DELSYM) 30 MG/5ML liquid Take 2.5 mLs (15 mg total) by mouth as needed for cough. Patient not taking: Reported on 12/10/2022 09/11/21   Tanda Rockers A, DO  dicyclomine (BENTYL) 10 MG capsule TAKE 1 CAPSULE (10 MG TOTAL) BY MOUTH 4 TIMES A DAY BEFORE MEALS AND AT BEDTIME Patient not taking: Reported on 12/10/2022 09/03/21   Unk Lightning, PA  fluticasone [facility]) 50 MCG/ACT nasal spray Place 1 spray into both nostrils daily for 7 days. Patient not taking: Reported on 06/17/2022 09/11/21 09/18/21  Tanda Rockers A, DO  pantoprazole (PROTONIX) 40 MG tablet Take 1 tablet (40 mg total) by mouth daily. Needs office visit. 12/11/22   Unk Lightning, PA  omeprazole (PRILOSEC OTC) 20 MG tablet Take 20 mg by mouth daily.    12/11/11  [provider]     Positive ROS: All other systems have been reviewed and were otherwise negative with the exception of those mentioned in the HPI and as above.  Physical Exam: General: Alert, no acute distress Cardiovascular: No pedal edema Respiratory: No cyanosis, no use of accessory musculature GI: abdomen soft Skin: No lesions in the area of chief complaint Neurologic: Sensation intact distally Psychiatric: Patient is competent for consent with normal mood and affect Lymphatic: no lymphedema  MUSCULOSKELETAL: exam stable  Assessment: RIGHT KNEE OSTEOARTHRITIS  Plan: Plan for Procedure(s): RIGHT TOTAL KNEE ARTHROPLASTY  The risks benefits and alternatives were discussed with the patient including but not limited to the risks of nonoperative treatment, versus surgical intervention including infection, bleeding, nerve injury,  blood clots, cardiopulmonary complications, morbidity, mortality, among others, and they were willing to proceed.   Glee Arvin, MD 12/23/2022 8:49 AM

## 2022-12-23 NOTE — Transfer of Care (Signed)
Immediate Anesthesia Transfer of Care Note  Patient: NECHELLE PERRINS  Procedure(s) Performed: RIGHT TOTAL KNEE ARTHROPLASTY (Right: Knee)  Patient Location: PACU  Anesthesia Type:MAC, Regional, and Spinal  Level of Consciousness: awake and patient cooperative  Airway & Oxygen Therapy: Patient Spontanous Breathing and Patient connected to face mask oxygen  Post-op Assessment: Report given to RN, Post -op Vital signs reviewed and stable, and Patient moving all extremities  Post vital signs: Reviewed and stable  Last Vitals:  Vitals Value Taken Time  BP 108/63 12/23/22 1230  Temp    Pulse 81 12/23/22 1232  Resp 12 12/23/22 1232  SpO2 96 % 12/23/22 1232  Vitals shown include unvalidated device data.  Last Pain:  Vitals:   12/23/22 0907  TempSrc:   PainSc: 6          Complications: No notable events documented.

## 2022-12-23 NOTE — Discharge Instructions (Signed)

## 2022-12-23 NOTE — Anesthesia Procedure Notes (Signed)
Spinal  Patient location during procedure: OR Start time: 12/23/2022 10:27 AM End time: 12/23/2022 10:30 AM Reason for block: surgical anesthesia Staffing Performed: anesthesiologist  Anesthesiologist: Achille Rich, MD Performed by: Achille Rich, MD Authorized by: Achille Rich, MD   Preanesthetic Checklist Completed: patient identified, IV checked, risks and benefits discussed, surgical consent, monitors and equipment checked, pre-op evaluation and timeout performed Spinal Block Patient position: sitting Prep: DuraPrep Patient monitoring: cardiac monitor, continuous pulse ox and blood pressure Approach: midline Location: L3-4 Injection technique: single-shot Needle Needle type: Pencan  Needle gauge: 24 G Needle length: 9 cm Assessment Sensory level: T10 Events: CSF return Additional Notes Functioning IV was confirmed and monitors were applied. Sterile prep and drape, including hand hygiene and sterile gloves were used. The patient was positioned and the spine was prepped. The skin was anesthetized with lidocaine.  Free flow of clear CSF was obtained prior to injecting local anesthetic into the CSF.  The spinal needle aspirated freely following injection.  The needle was carefully withdrawn.  The patient tolerated the procedure well.

## 2022-12-23 NOTE — Anesthesia Preprocedure Evaluation (Signed)
Anesthesia Evaluation  Patient identified by MRN, date of birth, ID band Patient awake    Reviewed: Allergy & Precautions, H&P , NPO status , Patient's Chart, lab work & pertinent test results  Airway Mallampati: II   Neck ROM: full    Dental   Pulmonary neg pulmonary ROS   breath sounds clear to auscultation       Cardiovascular hypertension,  Rhythm:regular Rate:Normal     Neuro/Psych  PSYCHIATRIC DISORDERS Anxiety        GI/Hepatic ,GERD  ,,  Endo/Other    Renal/GU Renal InsufficiencyRenal disease     Musculoskeletal  (+) Arthritis ,    Abdominal   Peds  Hematology   Anesthesia Other Findings   Reproductive/Obstetrics                             Anesthesia Physical Anesthesia Plan  ASA: 3  Anesthesia Plan: MAC and Spinal   Post-op Pain Management: Regional block*   Induction: Intravenous  PONV Risk Score and Plan: 2 and Ondansetron, Dexamethasone and Treatment may vary due to age or medical condition  Airway Management Planned: Simple Face Mask  Additional Equipment:   Intra-op Plan:   Post-operative Plan:   Informed Consent: I have reviewed the patients History and Physical, chart, labs and discussed the procedure including the risks, benefits and alternatives for the proposed anesthesia with the patient or authorized representative who has indicated his/her understanding and acceptance.     Dental advisory given  Plan Discussed with: CRNA, Anesthesiologist and Surgeon  Anesthesia Plan Comments:        Anesthesia Quick Evaluation

## 2022-12-24 ENCOUNTER — Other Ambulatory Visit: Payer: Self-pay | Admitting: Physician Assistant

## 2022-12-24 ENCOUNTER — Encounter (HOSPITAL_COMMUNITY): Payer: Self-pay | Admitting: Orthopaedic Surgery

## 2022-12-24 DIAGNOSIS — Z79899 Other long term (current) drug therapy: Secondary | ICD-10-CM | POA: Diagnosis not present

## 2022-12-24 DIAGNOSIS — Z7982 Long term (current) use of aspirin: Secondary | ICD-10-CM | POA: Diagnosis not present

## 2022-12-24 DIAGNOSIS — N189 Chronic kidney disease, unspecified: Secondary | ICD-10-CM | POA: Diagnosis not present

## 2022-12-24 DIAGNOSIS — I129 Hypertensive chronic kidney disease with stage 1 through stage 4 chronic kidney disease, or unspecified chronic kidney disease: Secondary | ICD-10-CM | POA: Diagnosis not present

## 2022-12-24 DIAGNOSIS — I700 Atherosclerosis of aorta: Secondary | ICD-10-CM | POA: Diagnosis not present

## 2022-12-24 DIAGNOSIS — M1711 Unilateral primary osteoarthritis, right knee: Secondary | ICD-10-CM | POA: Diagnosis not present

## 2022-12-24 DIAGNOSIS — Z96651 Presence of right artificial knee joint: Secondary | ICD-10-CM | POA: Diagnosis not present

## 2022-12-24 LAB — CBC
HCT: 41.2 % (ref 36.0–46.0)
Hemoglobin: 13.2 g/dL (ref 12.0–15.0)
MCH: 27.6 pg (ref 26.0–34.0)
MCHC: 32 g/dL (ref 30.0–36.0)
MCV: 86.2 fL (ref 80.0–100.0)
Platelets: 220 10*3/uL (ref 150–400)
RBC: 4.78 MIL/uL (ref 3.87–5.11)
RDW: 14.1 % (ref 11.5–15.5)
WBC: 15.5 10*3/uL — ABNORMAL HIGH (ref 4.0–10.5)
nRBC: 0 % (ref 0.0–0.2)

## 2022-12-24 MED ORDER — OXYCODONE-ACETAMINOPHEN 5-325 MG PO TABS: 1.0000 | ORAL_TABLET | Freq: Three times a day (TID) | ORAL | 0 refills | Status: DC | PRN

## 2022-12-24 MED ORDER — OXYCODONE-ACETAMINOPHEN 5-325 MG PO TABS: 1.0000 | ORAL_TABLET | Freq: Four times a day (QID) | ORAL | 0 refills | Status: DC | PRN

## 2022-12-24 NOTE — Anesthesia Postprocedure Evaluation (Signed)
Anesthesia Post Note  Patient: Cristina Sawyer  Procedure(s) Performed: RIGHT TOTAL KNEE ARTHROPLASTY (Right: Knee)     Patient location during evaluation: PACU Anesthesia Type: Regional, MAC and Spinal Level of consciousness: oriented and awake and alert Pain management: pain level controlled Vital Signs Assessment: post-procedure vital signs reviewed and stable Respiratory status: spontaneous breathing, respiratory function stable and patient connected to nasal cannula oxygen Cardiovascular status: blood pressure returned to baseline and stable Postop Assessment: no headache, no backache and no apparent nausea or vomiting Anesthetic complications: no   No notable events documented.  Last Vitals:  Vitals:   12/24/22 0431 12/24/22 0738  BP: (!) 131/54 (!) 121/57  Pulse: 73 75  Resp: 20 16  Temp: 36.7 C 36.7 C  SpO2: 97% 97%    Last Pain:  Vitals:   12/24/22 0738  TempSrc: Oral  PainSc:                  Murrel Bertram S

## 2022-12-24 NOTE — Evaluation (Signed)
Occupational Therapy Evaluation and Discharge Patient Details Name: Cristina Sawyer MRN: [id number] DOB: 1941/06/13 Today's Date: 12/24/2022   History of Present Illness 82 y.o. female presents to [facility] hospital on 12/23/2022 for elective R TKA. PMH includes OA, cataract, CKD, diverticulosis, dyslipidemia, HTN, GERD.   Clinical Impression   This 82 yo female admitted and underwent above presents to acute OT with all education completed and will have A prn at home. Acute OT will sign off.     Recommendations for follow up therapy are one component of a multi-disciplinary discharge planning process, led by the attending physician.  Recommendations may be updated based on patient status, additional functional criteria and insurance authorization.   Assistance Recommended at Discharge PRN  Patient can return home with the following Assistance with cooking/housework;Assist for transportation    Functional Status Assessment  Patient has had a recent decline in their functional status and demonstrates the ability to make significant improvements in function in a reasonable and predictable amount of time. (without further OT needs, all education completed)  Equipment Recommendations  None recommended by OT       Precautions / Restrictions Precautions Precautions: Fall;Knee Restrictions Weight Bearing Restrictions: No RLE Weight Bearing: Weight bearing as tolerated      Mobility Bed Mobility Overal bed mobility: Modified Independent             General bed mobility comments: HOB up    Transfers Overall transfer level: Needs assistance Equipment used: Rolling walker (2 wheels) Transfers: Sit to/from Stand Sit to Stand: Supervision           General transfer comment: VCs for safe hand placement      Balance Overall balance assessment: Mild deficits observed, not formally tested (in standing)                                         ADL either performed or  assessed with clinical judgement   ADL Overall ADL's :  (overall at a minguard A when up on her feet with RW)                                       General ADL Comments: Educated on use care and use of ice machine for her knee as well as the different stratgies she can use to do tub/shower transfer.     Vision Patient Visual Report: No change from baseline              Pertinent Vitals/Pain Pain Assessment Pain Assessment: Faces Faces Pain Scale: Hurts little more Pain Location: R knee Pain Descriptors / Indicators: Aching, Sore Pain Intervention(s): Limited activity within patient's tolerance, Monitored during session, Repositioned     Hand Dominance Right   Extremity/Trunk Assessment Upper Extremity Assessment Upper Extremity Assessment: Overall WFL for tasks assessed           Communication Communication Communication: No difficulties   Cognition Arousal/Alertness: Awake/alert Behavior During Therapy: WFL for tasks assessed/performed Overall Cognitive Status: Within Functional Limits for tasks assessed  Home Living Family/patient expects to be discharged to:: Private residence Living Arrangements: Alone Available Help at Discharge: Friend(s);Available 24 hours/day Type of Home: House Home Access: Stairs to enter [employer] of Steps: 1 Entrance Stairs-Rails: None Home Layout: One level     Bathroom Shower/Tub: Tub/shower unit;Curtain   Firefighter: Standard     Home Equipment: Agricultural consultant (2 wheels);Shower seat          Prior Functioning/Environment Prior Level of Function : Independent/Modified Independent;Driving                        OT Problem List: Decreased strength;Decreased range of motion;Impaired balance (sitting and/or standing);Pain         OT Goals(Current goals can be found in the care plan section) Acute Rehab OT  Goals Patient Stated Goal: to go home today OT Goal Formulation: With patient         AM-PAC OT "6 Clicks" Daily Activity     Outcome Measure Help from another person eating meals?: None Help from another person taking care of personal grooming?: None Help from another person toileting, which includes using toliet, bedpan, or urinal?: None Help from another person bathing (including washing, rinsing, drying)?: None Help from another person to put on and taking off regular upper body clothing?: None Help from another person to put on and taking off regular lower body clothing?: None 6 Click Score: 24   End of Session Equipment Utilized During Treatment: Rolling walker (2 wheels) Nurse Communication:  (No further OT needs)  Activity Tolerance: Patient tolerated treatment well Patient left:  (walking with PT)  OT Visit Diagnosis: Unsteadiness on feet (R26.81);Other abnormalities of gait and mobility (R26.89);Pain Pain - Right/Left: Right Pain - part of body: Knee                Time: 7902-4097 OT Time Calculation (min): 24 min Charges:  OT General Charges $OT Visit: 1 Visit OT Evaluation $OT Eval Moderate Complexity: 1 Mod OT Treatments $Self Care/Home Management : 8-22 mins  Lindon Romp OT Acute Rehabilitation Services Office [phone]    Evette Georges 12/24/2022, 9:43 AM

## 2022-12-24 NOTE — Progress Notes (Signed)
Subjective: 1 Day Post-Op Procedure(s) (LRB): RIGHT TOTAL KNEE ARTHROPLASTY (Right) Patient reports pain as mild.    Objective: Vital signs in last 24 hours: Temp:  [97.5 F (36.4 C)-98 F (36.7 C)] 98 F (36.7 C) (04/09 0738) Pulse Rate:  [51-99] 75 (04/09 0738) Resp:  [9-20] 16 (04/09 0738) BP: (108-159)/(54-82) 121/57 (04/09 0738) SpO2:  [94 %-100 %] 97 % (04/09 0738) Weight:  [82.6 kg] 82.6 kg (04/08 0847)  Intake/Output from previous day: 04/08 0701 - 04/09 0700 In: 680 [P.O.:480; IV Piggyback:200] Out: 920 [Urine:900; Blood:20] Intake/Output this shift: No intake/output data recorded.  Recent Labs    12/24/22 0603  HGB 13.2   Recent Labs    12/24/22 0603  WBC 15.5*  RBC 4.78  HCT 41.2  PLT 220   No results for input(s): "NA", "K", "CL", "CO2", "BUN", "CREATININE", "GLUCOSE", "CALCIUM" in the last 72 hours. No results for input(s): "LABPT", "INR" in the last 72 hours.  Neurologically intact Neurovascular intact Sensation intact distally Intact pulses distally Dorsiflexion/Plantar flexion intact Incision: dressing C/D/I No cellulitis present Compartment soft   Assessment/Plan: 1 Day Post-Op Procedure(s) (LRB): RIGHT TOTAL KNEE ARTHROPLASTY (Right) Advance diet Up with therapy D/C IV fluids Discharge home with home health once clears PT and has voided WBAT RLE      Cristie Hem 12/24/2022, 7:46 AM

## 2022-12-24 NOTE — Discharge Summary (Addendum)
Patient ID: Cristina Sawyer MRN: [id number] DOB/AGE: 82-Mar-1942 82 y.o.  Admit date: 12/23/2022 Discharge date: 12/24/2022  Admission Diagnoses:  Principal Problem:   Primary osteoarthritis of right knee Active Problems:   Status post total right knee replacement   Discharge Diagnoses:  Same  Past Medical History:  Diagnosis Date   Arthritis    Cataract    Chronic kidney disease    Diverticulosis    Dyslipidemia    GERD (gastroesophageal reflux disease)    HTN (hypertension) 2015   Personal history of colonic adenoma 08/09/2008   Vitamin D deficiency     Surgeries: Procedure(s): RIGHT TOTAL KNEE ARTHROPLASTY on 12/23/2022   Consultants:   Discharged Condition: Improved  Hospital Course: FAVOUR KEEGAN is an 82 y.o. female who was admitted 12/23/2022 for operative treatment ofPrimary osteoarthritis of right knee. Patient has severe unremitting pain that affects sleep, daily activities, and work/hobbies. After pre-op clearance the patient was taken to the operating room on 12/23/2022 and underwent  Procedure(s): RIGHT TOTAL KNEE ARTHROPLASTY.    Patient was given perioperative antibiotics:  Anti-infectives (From admission, onward)    Start     Dose/Rate Route Frequency Ordered Stop   12/23/22 1630  ceFAZolin (ANCEF) IVPB 2g/100 mL premix        2 g 200 mL/hr over 30 Minutes Intravenous Every 6 hours 12/23/22 1425 12/24/22 0831   12/23/22 1112  vancomycin (VANCOCIN) powder  Status:  Discontinued          As needed 12/23/22 1112 12/23/22 1233   12/23/22 0845  ceFAZolin (ANCEF) IVPB 2g/100 mL premix        2 g 200 mL/hr over 30 Minutes Intravenous On call to O.R. 12/23/22 0836 12/23/22 1026        Patient was given sequential compression devices, early ambulation, and chemoprophylaxis to prevent DVT.  Patient benefited maximally from hospital stay and there were no complications.    Recent vital signs: Patient Vitals for the past 24 hrs:  BP Temp Temp src Pulse Resp  SpO2  12/24/22 0738 (!) 121/57 98 F (36.7 C) Oral 75 16 97 %  12/24/22 0431 (!) 131/54 98 F (36.7 C) Oral 73 20 97 %  12/23/22 2307 108/60 (!) 97.5 F (36.4 C) Oral 60 18 100 %  12/23/22 1921 (!) 143/57 97.6 F (36.4 C) Oral 77 18 97 %  12/23/22 1814 125/62 97.7 F (36.5 C) Oral 69 20 99 %  12/23/22 1430 111/62 97.6 F (36.4 C) Oral 68 19 98 %  12/23/22 1415 133/65 97.9 F (36.6 C) -- 60 (!) 9 99 %  12/23/22 1400 127/70 -- -- (!) 53 10 100 %  12/23/22 1345 137/70 97.9 F (36.6 C) -- (!) 51 11 100 %  12/23/22 1330 128/74 -- -- (!) 52 11 98 %  12/23/22 1315 123/80 -- -- (!) 54 13 100 %  12/23/22 1300 113/82 -- -- (!) 56 13 99 %  12/23/22 1245 112/61 -- -- 72 14 96 %  12/23/22 1230 108/63 97.9 F (36.6 C) -- 80 16 94 %     Recent laboratory studies:  Recent Labs    12/24/22 0603  WBC 15.5*  HGB 13.2  HCT 41.2  PLT 220     Discharge Medications:   Allergies as of 12/24/2022       Reactions   Codeine Itching, Nausea Only   Ciprofloxacin Hcl Rash        Medication List     STOP taking  these medications    albuterol 108 (90 Base) MCG/ACT inhaler Commonly known as: VENTOLIN HFA   celecoxib 200 MG capsule Commonly known as: CeleBREX   dextromethorphan 30 MG/5ML liquid Commonly known as: Delsym   dicyclomine 10 MG capsule Commonly known as: BENTYL   fluticasone 50 MCG/ACT nasal spray Commonly known as: FLONASE   ibuprofen 200 MG tablet Commonly known as: ADVIL       TAKE these medications    amLODipine 10 MG tablet Commonly known as: NORVASC TAKE 1 TABLET BY MOUTH EVERY DAY   aspirin EC 81 MG tablet Take 1 tablet (81 mg total) by mouth 2 (two) times daily. To be taken after surgery to prevent blood clots What changed: Another medication with the same name was removed. Continue taking this medication, and follow the directions you see here.   cetirizine 10 MG tablet Commonly known as: ZYRTEC Take 10 mg by mouth daily as needed for allergies.    docusate sodium 100 MG capsule Commonly known as: Colace Take 1 capsule (100 mg total) by mouth daily as needed.   methocarbamol 500 MG tablet Commonly known as: ROBAXIN Take 1 tablet (500 mg total) by mouth 2 (two) times daily as needed.   MULTI-VITAMIN DAILY PO Take 1 tablet by mouth 3 (three) times a week.   ondansetron 4 MG tablet Commonly known as: Zofran Take 1 tablet (4 mg total) by mouth every 8 (eight) hours as needed for nausea or vomiting.   oxyCODONE-acetaminophen 5-325 MG tablet Commonly known as: Percocet Take 1-2 tablets by mouth every 6 (six) hours as needed. What changed: additional instructions   pantoprazole 40 MG tablet Commonly known as: PROTONIX Take 1 tablet (40 mg total) by mouth daily. Needs office visit.   rosuvastatin 5 MG tablet Commonly known as: CRESTOR TAKE 1 TABLET (5 MG TOTAL) BY MOUTH DAILY.   trolamine salicylate 10 % cream Commonly known as: ASPERCREME Apply 1 Application topically as needed for muscle pain.   VITAMIN D PO Take 2,000 Units by mouth daily.               Durable Medical Equipment  (From admission, onward)           Start     Ordered   12/23/22 1426  DME Walker rolling  Once       Question Answer Comment  Walker: With 5 Inch Wheels   Patient needs a walker to treat with the following condition Status post left partial knee replacement      12/23/22 1425   12/23/22 1426  DME 3 n 1  Once        12/23/22 1425   12/23/22 1426  DME Bedside commode  Once       Question:  Patient needs a bedside commode to treat with the following condition  Answer:  Status post left partial knee replacement   12/23/22 1425            Diagnostic Studies: DG Knee Right Port  Result Date: 12/23/2022 CLINICAL DATA:  Pain EXAM: PORTABLE RIGHT KNEE - 1-2 VIEW COMPARISON:  Knee radiograph 09/06/2022 FINDINGS: Expected postsurgical changes from recent knee arthroplasty including subcutaneous and intra-articular air. There is a  knee joint effusion. No evidence of periprosthetic fracture. Normal alignment. IMPRESSION: Postsurgical changes from right knee arthroplasty including subcutaneous and intra-articular air, as well as a joint effusion. Electronically Signed   By: Lorenza CambridgeHemant  Desai M.D.   On: 12/23/2022 14:43    Disposition: Discharge  disposition: 01-Home or Self Care          Follow-up Information     [facility] Follow up.   Specialty: Home Health Services Why: The home health agency will contact you for the first home visit Contact information: [address] [place] [address] [phone]         Tarry Kos, MD. Schedule an appointment as soon as possible for a visit in 2 week(s).   Specialty: Orthopedic Surgery Contact information: [address] [place] [address] [phone]                  Signed: Cristie Hem 12/24/2022, 11:01 AM

## 2022-12-24 NOTE — Plan of Care (Signed)
  Problem: Education: Goal: Knowledge of the prescribed therapeutic regimen will improve Outcome: Completed/Met Goal: Individualized Educational Video(s) Outcome: Completed/Met   Problem: Activity: Goal: Ability to avoid complications of mobility impairment will improve Outcome: Completed/Met Goal: Range of joint motion will improve Outcome: Completed/Met   Problem: Clinical Measurements: Goal: Postoperative complications will be avoided or minimized Outcome: Completed/Met   Problem: Pain Management: Goal: Pain level will decrease with appropriate interventions Outcome: Completed/Met   Problem: Skin Integrity: Goal: Will show signs of wound healing Outcome: Completed/Met   

## 2022-12-24 NOTE — Progress Notes (Signed)
Patient alert and oriented, ambulate, void, surgical site clean and dry. D/c instructions explain and given, all questions answered.patient d/c home per order.

## 2022-12-24 NOTE — Progress Notes (Signed)
Physical Therapy Treatment Patient Details Name: Cristina Sawyer MRN: [id number] DOB: 07-28-1941 Today's Date: 12/24/2022   History of Present Illness 82 y.o. female presents to [facility] hospital on 12/23/2022 for elective R TKA. PMH includes OA, cataract, CKD, diverticulosis, dyslipidemia, HTN, GERD.    PT Comments    Pt with great progress towards acute goals this session, progressing gait for hallway distance at supervision level with RW support. Pt able to negotiate steps with up to min A and HHA to steady to simulate home as pt has single step without rail to enter home. Pt was educated on continued walker use to maximize functional independence, safety, and decrease risk for falls as well as safe car entry/exit, appropriate activity progression, HEP and compliance and importance of continued mobility with pt verbalizing understanding. Anticipate safe discharge, with assist level outlined below, once medically cleared, will continue to follow acutely.    Recommendations for follow up therapy are one component of a multi-disciplinary discharge planning process, led by the attending physician.  Recommendations may be updated based on patient status, additional functional criteria and insurance authorization.  Follow Up Recommendations       Assistance Recommended at Discharge Intermittent Supervision/Assistance  Patient can return home with the following A little help with walking and/or transfers;A lot of help with bathing/dressing/bathroom;Assistance with cooking/housework;Assist for transportation;Help with stairs or ramp for entrance   Equipment Recommendations  BSC/3in1    Recommendations for Other Services       Precautions / Restrictions Precautions Precautions: Fall;Knee Precaution Booklet Issued: Yes (comment) Restrictions Weight Bearing Restrictions: No RLE Weight Bearing: Weight bearing as tolerated     Mobility  Bed Mobility Overal bed mobility: Modified Independent              General bed mobility comments: pt up OOB on arrival    Transfers Overall transfer level: Needs assistance Equipment used: Rolling walker (2 wheels) Transfers: Sit to/from Stand Sit to Stand: Supervision           General transfer comment: VCs for safe hand placement    Ambulation/Gait Ambulation/Gait assistance: Min guard, Supervision Gait Distance (Feet): 250 Feet Assistive device: Rolling walker (2 wheels) Gait Pattern/deviations: Step-to pattern, Step-through pattern Gait velocity: reduced     General Gait Details: step-to pattern progressing to more fluid step through pattern with increased distance, cues for RW proximity   Stairs Stairs: Yes Stairs assistance: Min assist, Min guard Stair Management: One rail Left, No rails, Forwards, Step to pattern Number of Stairs: 2 General stair comments: up/dwon steps with stairwell with up to min A to steady via HHA without rail use to simulate home, pt able to recall sequencing at end of session   Wheelchair Mobility    Modified Rankin (Stroke Patients Only)       Balance Overall balance assessment: Mild deficits observed, not formally tested (in standing) Sitting-balance support: No upper extremity supported, Feet supported Sitting balance-Leahy Scale: Good     Standing balance support: Bilateral upper extremity supported, Reliant on assistive device for balance Standing balance-Leahy Scale: Poor                              Cognition Arousal/Alertness: Awake/alert Behavior During Therapy: WFL for tasks assessed/performed Overall Cognitive Status: Within Functional Limits for tasks assessed  Exercises      General Comments General comments (skin integrity, edema, etc.): VSS on RA      Pertinent Vitals/Pain Pain Assessment Pain Assessment: Faces Faces Pain Scale: Hurts little more Pain Location: R knee Pain Descriptors  / Indicators: Aching, Sore Pain Intervention(s): Premedicated before session, Monitored during session, Limited activity within patient's tolerance    Home Living Family/patient expects to be discharged to:: Private residence Living Arrangements: Alone Available Help at Discharge: Friend(s);Available 24 hours/day Type of Home: House Home Access: Stairs to enter Entrance Stairs-Rails: None Entrance Stairs-Number of Steps: 1   Home Layout: One level Home Equipment: Agricultural consultant (2 wheels);Shower seat      Prior Function            PT Goals (current goals can now be found in the care plan section) Acute Rehab PT Goals Patient Stated Goal: to reduce pain and go home PT Goal Formulation: With patient Time For Goal Achievement: 12/27/22 Progress towards PT goals: Progressing toward goals    Frequency    7X/week      PT Plan      Co-evaluation              AM-PAC PT "6 Clicks" Mobility   Outcome Measure  Help needed turning from your back to your side while in a flat bed without using bedrails?: A Little Help needed moving from lying on your back to sitting on the side of a flat bed without using bedrails?: A Little Help needed moving to and from a bed to a chair (including a wheelchair)?: A Little Help needed standing up from a chair using your arms (e.g., wheelchair or bedside chair)?: A Little Help needed to walk in hospital room?: A Little Help needed climbing 3-5 steps with a railing? : A Little 6 Click Score: 18    End of Session   Activity Tolerance: Patient tolerated treatment well Patient left: with call bell/phone within reach;in bed;Other (comment) (seated EOB) Nurse Communication: Mobility status;Other (comment) (pt without need for PM session) PT Visit Diagnosis: Other abnormalities of gait and mobility (R26.89);Muscle weakness (generalized) (M62.81);Pain Pain - Right/Left: Right Pain - part of body: Knee     Time: 6789-3810 PT Time  Calculation (min) (ACUTE ONLY): 24 min  Charges:  $Gait Training: 8-22 mins $Therapeutic Activity: 8-22 mins                     Trevino Wyatt R. PTA Acute Rehabilitation Services Office: [phone]   Catalina Antigua 12/24/2022, 10:24 AM

## 2022-12-25 DIAGNOSIS — I129 Hypertensive chronic kidney disease with stage 1 through stage 4 chronic kidney disease, or unspecified chronic kidney disease: Secondary | ICD-10-CM | POA: Diagnosis not present

## 2022-12-25 DIAGNOSIS — F411 Generalized anxiety disorder: Secondary | ICD-10-CM | POA: Diagnosis not present

## 2022-12-25 DIAGNOSIS — Z86010 Personal history of colonic polyps: Secondary | ICD-10-CM | POA: Diagnosis not present

## 2022-12-25 DIAGNOSIS — Z96651 Presence of right artificial knee joint: Secondary | ICD-10-CM | POA: Diagnosis not present

## 2022-12-25 DIAGNOSIS — I700 Atherosclerosis of aorta: Secondary | ICD-10-CM | POA: Diagnosis not present

## 2022-12-25 DIAGNOSIS — D72829 Elevated white blood cell count, unspecified: Secondary | ICD-10-CM | POA: Diagnosis not present

## 2022-12-25 DIAGNOSIS — E559 Vitamin D deficiency, unspecified: Secondary | ICD-10-CM | POA: Diagnosis not present

## 2022-12-25 DIAGNOSIS — N189 Chronic kidney disease, unspecified: Secondary | ICD-10-CM | POA: Diagnosis not present

## 2022-12-25 DIAGNOSIS — M8580 Other specified disorders of bone density and structure, unspecified site: Secondary | ICD-10-CM | POA: Diagnosis not present

## 2022-12-25 DIAGNOSIS — Z7982 Long term (current) use of aspirin: Secondary | ICD-10-CM | POA: Diagnosis not present

## 2022-12-25 DIAGNOSIS — H259 Unspecified age-related cataract: Secondary | ICD-10-CM | POA: Diagnosis not present

## 2022-12-25 DIAGNOSIS — K5790 Diverticulosis of intestine, part unspecified, without perforation or abscess without bleeding: Secondary | ICD-10-CM | POA: Diagnosis not present

## 2022-12-25 DIAGNOSIS — Z471 Aftercare following joint replacement surgery: Secondary | ICD-10-CM | POA: Diagnosis not present

## 2022-12-25 DIAGNOSIS — E785 Hyperlipidemia, unspecified: Secondary | ICD-10-CM | POA: Diagnosis not present

## 2022-12-25 DIAGNOSIS — K219 Gastro-esophageal reflux disease without esophagitis: Secondary | ICD-10-CM | POA: Diagnosis not present

## 2022-12-26 ENCOUNTER — Telehealth: Payer: Self-pay | Admitting: *Deleted

## 2022-12-26 ENCOUNTER — Other Ambulatory Visit: Payer: Self-pay | Admitting: *Deleted

## 2022-12-26 DIAGNOSIS — M1711 Unilateral primary osteoarthritis, right knee: Secondary | ICD-10-CM

## 2022-12-26 DIAGNOSIS — Z96651 Presence of right artificial knee joint: Secondary | ICD-10-CM

## 2022-12-26 NOTE — Telephone Encounter (Signed)
Ortho bundle D/C call completed. 

## 2022-12-27 DIAGNOSIS — E559 Vitamin D deficiency, unspecified: Secondary | ICD-10-CM | POA: Diagnosis not present

## 2022-12-27 DIAGNOSIS — Z86010 Personal history of colonic polyps: Secondary | ICD-10-CM | POA: Diagnosis not present

## 2022-12-27 DIAGNOSIS — D72829 Elevated white blood cell count, unspecified: Secondary | ICD-10-CM | POA: Diagnosis not present

## 2022-12-27 DIAGNOSIS — I129 Hypertensive chronic kidney disease with stage 1 through stage 4 chronic kidney disease, or unspecified chronic kidney disease: Secondary | ICD-10-CM | POA: Diagnosis not present

## 2022-12-27 DIAGNOSIS — E785 Hyperlipidemia, unspecified: Secondary | ICD-10-CM | POA: Diagnosis not present

## 2022-12-27 DIAGNOSIS — Z96651 Presence of right artificial knee joint: Secondary | ICD-10-CM | POA: Diagnosis not present

## 2022-12-27 DIAGNOSIS — Z7982 Long term (current) use of aspirin: Secondary | ICD-10-CM | POA: Diagnosis not present

## 2022-12-27 DIAGNOSIS — Z471 Aftercare following joint replacement surgery: Secondary | ICD-10-CM | POA: Diagnosis not present

## 2022-12-27 DIAGNOSIS — K219 Gastro-esophageal reflux disease without esophagitis: Secondary | ICD-10-CM | POA: Diagnosis not present

## 2022-12-27 DIAGNOSIS — H259 Unspecified age-related cataract: Secondary | ICD-10-CM | POA: Diagnosis not present

## 2022-12-27 DIAGNOSIS — I700 Atherosclerosis of aorta: Secondary | ICD-10-CM | POA: Diagnosis not present

## 2022-12-27 DIAGNOSIS — M8580 Other specified disorders of bone density and structure, unspecified site: Secondary | ICD-10-CM | POA: Diagnosis not present

## 2022-12-27 DIAGNOSIS — N189 Chronic kidney disease, unspecified: Secondary | ICD-10-CM | POA: Diagnosis not present

## 2022-12-27 DIAGNOSIS — K5790 Diverticulosis of intestine, part unspecified, without perforation or abscess without bleeding: Secondary | ICD-10-CM | POA: Diagnosis not present

## 2022-12-27 DIAGNOSIS — F411 Generalized anxiety disorder: Secondary | ICD-10-CM | POA: Diagnosis not present

## 2022-12-30 ENCOUNTER — Telehealth: Payer: Self-pay | Admitting: Orthopaedic Surgery

## 2022-12-30 DIAGNOSIS — M1711 Unilateral primary osteoarthritis, right knee: Secondary | ICD-10-CM | POA: Diagnosis not present

## 2022-12-30 DIAGNOSIS — E559 Vitamin D deficiency, unspecified: Secondary | ICD-10-CM | POA: Diagnosis not present

## 2022-12-30 DIAGNOSIS — K5790 Diverticulosis of intestine, part unspecified, without perforation or abscess without bleeding: Secondary | ICD-10-CM | POA: Diagnosis not present

## 2022-12-30 DIAGNOSIS — I129 Hypertensive chronic kidney disease with stage 1 through stage 4 chronic kidney disease, or unspecified chronic kidney disease: Secondary | ICD-10-CM | POA: Diagnosis not present

## 2022-12-30 DIAGNOSIS — M8580 Other specified disorders of bone density and structure, unspecified site: Secondary | ICD-10-CM | POA: Diagnosis not present

## 2022-12-30 DIAGNOSIS — I700 Atherosclerosis of aorta: Secondary | ICD-10-CM | POA: Diagnosis not present

## 2022-12-30 DIAGNOSIS — D72829 Elevated white blood cell count, unspecified: Secondary | ICD-10-CM | POA: Diagnosis not present

## 2022-12-30 DIAGNOSIS — H259 Unspecified age-related cataract: Secondary | ICD-10-CM | POA: Diagnosis not present

## 2022-12-30 DIAGNOSIS — Z86010 Personal history of colonic polyps: Secondary | ICD-10-CM | POA: Diagnosis not present

## 2022-12-30 DIAGNOSIS — N189 Chronic kidney disease, unspecified: Secondary | ICD-10-CM | POA: Diagnosis not present

## 2022-12-30 DIAGNOSIS — F411 Generalized anxiety disorder: Secondary | ICD-10-CM | POA: Diagnosis not present

## 2022-12-30 DIAGNOSIS — E785 Hyperlipidemia, unspecified: Secondary | ICD-10-CM | POA: Diagnosis not present

## 2022-12-30 DIAGNOSIS — Z7982 Long term (current) use of aspirin: Secondary | ICD-10-CM | POA: Diagnosis not present

## 2022-12-30 DIAGNOSIS — Z471 Aftercare following joint replacement surgery: Secondary | ICD-10-CM | POA: Diagnosis not present

## 2022-12-30 DIAGNOSIS — Z96651 Presence of right artificial knee joint: Secondary | ICD-10-CM | POA: Diagnosis not present

## 2022-12-30 DIAGNOSIS — K219 Gastro-esophageal reflux disease without esophagitis: Secondary | ICD-10-CM | POA: Diagnosis not present

## 2022-12-30 NOTE — Telephone Encounter (Signed)
Patient called, she would like a refill on the oxycodone called in on Wednesday. Her call back number is [phone]

## 2022-12-31 ENCOUNTER — Other Ambulatory Visit: Payer: Self-pay | Admitting: Physician Assistant

## 2022-12-31 MED ORDER — OXYCODONE-ACETAMINOPHEN 5-325 MG PO TABS: 1.0000 | ORAL_TABLET | Freq: Three times a day (TID) | ORAL | 0 refills | Status: DC | PRN

## 2022-12-31 NOTE — Telephone Encounter (Signed)
Went ahead and sent in

## 2023-01-01 DIAGNOSIS — F411 Generalized anxiety disorder: Secondary | ICD-10-CM | POA: Diagnosis not present

## 2023-01-01 DIAGNOSIS — K219 Gastro-esophageal reflux disease without esophagitis: Secondary | ICD-10-CM | POA: Diagnosis not present

## 2023-01-01 DIAGNOSIS — M8580 Other specified disorders of bone density and structure, unspecified site: Secondary | ICD-10-CM | POA: Diagnosis not present

## 2023-01-01 DIAGNOSIS — Z471 Aftercare following joint replacement surgery: Secondary | ICD-10-CM | POA: Diagnosis not present

## 2023-01-01 DIAGNOSIS — Z96651 Presence of right artificial knee joint: Secondary | ICD-10-CM | POA: Diagnosis not present

## 2023-01-01 DIAGNOSIS — D72829 Elevated white blood cell count, unspecified: Secondary | ICD-10-CM | POA: Diagnosis not present

## 2023-01-01 DIAGNOSIS — I129 Hypertensive chronic kidney disease with stage 1 through stage 4 chronic kidney disease, or unspecified chronic kidney disease: Secondary | ICD-10-CM | POA: Diagnosis not present

## 2023-01-01 DIAGNOSIS — I700 Atherosclerosis of aorta: Secondary | ICD-10-CM | POA: Diagnosis not present

## 2023-01-01 DIAGNOSIS — E559 Vitamin D deficiency, unspecified: Secondary | ICD-10-CM | POA: Diagnosis not present

## 2023-01-01 DIAGNOSIS — K5790 Diverticulosis of intestine, part unspecified, without perforation or abscess without bleeding: Secondary | ICD-10-CM | POA: Diagnosis not present

## 2023-01-01 DIAGNOSIS — N189 Chronic kidney disease, unspecified: Secondary | ICD-10-CM | POA: Diagnosis not present

## 2023-01-01 DIAGNOSIS — H259 Unspecified age-related cataract: Secondary | ICD-10-CM | POA: Diagnosis not present

## 2023-01-01 DIAGNOSIS — E785 Hyperlipidemia, unspecified: Secondary | ICD-10-CM | POA: Diagnosis not present

## 2023-01-01 DIAGNOSIS — Z86010 Personal history of colonic polyps: Secondary | ICD-10-CM | POA: Diagnosis not present

## 2023-01-01 DIAGNOSIS — Z7982 Long term (current) use of aspirin: Secondary | ICD-10-CM | POA: Diagnosis not present

## 2023-01-03 DIAGNOSIS — E559 Vitamin D deficiency, unspecified: Secondary | ICD-10-CM | POA: Diagnosis not present

## 2023-01-03 DIAGNOSIS — N189 Chronic kidney disease, unspecified: Secondary | ICD-10-CM | POA: Diagnosis not present

## 2023-01-03 DIAGNOSIS — D72829 Elevated white blood cell count, unspecified: Secondary | ICD-10-CM | POA: Diagnosis not present

## 2023-01-03 DIAGNOSIS — Z86010 Personal history of colonic polyps: Secondary | ICD-10-CM | POA: Diagnosis not present

## 2023-01-03 DIAGNOSIS — F411 Generalized anxiety disorder: Secondary | ICD-10-CM | POA: Diagnosis not present

## 2023-01-03 DIAGNOSIS — E785 Hyperlipidemia, unspecified: Secondary | ICD-10-CM | POA: Diagnosis not present

## 2023-01-03 DIAGNOSIS — K219 Gastro-esophageal reflux disease without esophagitis: Secondary | ICD-10-CM | POA: Diagnosis not present

## 2023-01-03 DIAGNOSIS — M8580 Other specified disorders of bone density and structure, unspecified site: Secondary | ICD-10-CM | POA: Diagnosis not present

## 2023-01-03 DIAGNOSIS — I129 Hypertensive chronic kidney disease with stage 1 through stage 4 chronic kidney disease, or unspecified chronic kidney disease: Secondary | ICD-10-CM | POA: Diagnosis not present

## 2023-01-03 DIAGNOSIS — Z7982 Long term (current) use of aspirin: Secondary | ICD-10-CM | POA: Diagnosis not present

## 2023-01-03 DIAGNOSIS — Z471 Aftercare following joint replacement surgery: Secondary | ICD-10-CM | POA: Diagnosis not present

## 2023-01-03 DIAGNOSIS — Z96651 Presence of right artificial knee joint: Secondary | ICD-10-CM | POA: Diagnosis not present

## 2023-01-03 DIAGNOSIS — I700 Atherosclerosis of aorta: Secondary | ICD-10-CM | POA: Diagnosis not present

## 2023-01-03 DIAGNOSIS — H259 Unspecified age-related cataract: Secondary | ICD-10-CM | POA: Diagnosis not present

## 2023-01-03 DIAGNOSIS — K5790 Diverticulosis of intestine, part unspecified, without perforation or abscess without bleeding: Secondary | ICD-10-CM | POA: Diagnosis not present

## 2023-01-06 DIAGNOSIS — I129 Hypertensive chronic kidney disease with stage 1 through stage 4 chronic kidney disease, or unspecified chronic kidney disease: Secondary | ICD-10-CM | POA: Diagnosis not present

## 2023-01-06 DIAGNOSIS — N189 Chronic kidney disease, unspecified: Secondary | ICD-10-CM | POA: Diagnosis not present

## 2023-01-06 DIAGNOSIS — E785 Hyperlipidemia, unspecified: Secondary | ICD-10-CM | POA: Diagnosis not present

## 2023-01-06 DIAGNOSIS — Z471 Aftercare following joint replacement surgery: Secondary | ICD-10-CM | POA: Diagnosis not present

## 2023-01-06 DIAGNOSIS — Z7982 Long term (current) use of aspirin: Secondary | ICD-10-CM | POA: Diagnosis not present

## 2023-01-06 DIAGNOSIS — K5790 Diverticulosis of intestine, part unspecified, without perforation or abscess without bleeding: Secondary | ICD-10-CM | POA: Diagnosis not present

## 2023-01-06 DIAGNOSIS — E559 Vitamin D deficiency, unspecified: Secondary | ICD-10-CM | POA: Diagnosis not present

## 2023-01-06 DIAGNOSIS — Z96651 Presence of right artificial knee joint: Secondary | ICD-10-CM | POA: Diagnosis not present

## 2023-01-06 DIAGNOSIS — Z86010 Personal history of colonic polyps: Secondary | ICD-10-CM | POA: Diagnosis not present

## 2023-01-06 DIAGNOSIS — D72829 Elevated white blood cell count, unspecified: Secondary | ICD-10-CM | POA: Diagnosis not present

## 2023-01-06 DIAGNOSIS — F411 Generalized anxiety disorder: Secondary | ICD-10-CM | POA: Diagnosis not present

## 2023-01-06 DIAGNOSIS — I700 Atherosclerosis of aorta: Secondary | ICD-10-CM | POA: Diagnosis not present

## 2023-01-06 DIAGNOSIS — H259 Unspecified age-related cataract: Secondary | ICD-10-CM | POA: Diagnosis not present

## 2023-01-06 DIAGNOSIS — M8580 Other specified disorders of bone density and structure, unspecified site: Secondary | ICD-10-CM | POA: Diagnosis not present

## 2023-01-06 DIAGNOSIS — K219 Gastro-esophageal reflux disease without esophagitis: Secondary | ICD-10-CM | POA: Diagnosis not present

## 2023-01-07 ENCOUNTER — Other Ambulatory Visit: Payer: Self-pay

## 2023-01-07 ENCOUNTER — Ambulatory Visit: Payer: Medicare HMO | Admitting: Physical Therapy

## 2023-01-07 ENCOUNTER — Encounter: Payer: Self-pay | Admitting: Physician Assistant

## 2023-01-07 ENCOUNTER — Ambulatory Visit (INDEPENDENT_AMBULATORY_CARE_PROVIDER_SITE_OTHER): Payer: Medicare HMO | Admitting: Physician Assistant

## 2023-01-07 ENCOUNTER — Encounter: Payer: Self-pay | Admitting: Physical Therapy

## 2023-01-07 DIAGNOSIS — R262 Difficulty in walking, not elsewhere classified: Secondary | ICD-10-CM | POA: Diagnosis not present

## 2023-01-07 DIAGNOSIS — M25661 Stiffness of right knee, not elsewhere classified: Secondary | ICD-10-CM | POA: Diagnosis not present

## 2023-01-07 DIAGNOSIS — M6281 Muscle weakness (generalized): Secondary | ICD-10-CM

## 2023-01-07 DIAGNOSIS — M25561 Pain in right knee: Secondary | ICD-10-CM

## 2023-01-07 DIAGNOSIS — Z96651 Presence of right artificial knee joint: Secondary | ICD-10-CM

## 2023-01-07 DIAGNOSIS — R600 Localized edema: Secondary | ICD-10-CM

## 2023-01-07 MED ORDER — OXYCODONE-ACETAMINOPHEN 5-325 MG PO TABS: 1.0000 | ORAL_TABLET | Freq: Four times a day (QID) | ORAL | 0 refills | Status: DC | PRN

## 2023-01-07 NOTE — Progress Notes (Signed)
Post-Op Visit Note   Patient: Cristina Sawyer           Date of Birth: Feb 19, 1941           MRN: [id number] Visit Date: 01/07/2023 PCP: Ronnald Nian, MD   Assessment & Plan:  Chief Complaint:  Chief Complaint  Patient presents with   Right Knee - Follow-up    Right total knee arthroplasty 12/23/2022   Visit Diagnoses:  1. Status post total right knee replacement     Plan: Patient is a pleasant 82 year old female who comes in today 2 weeks status post right total knee replacement for 12/23/2022.  She has been doing okay.  She is ambulating with a walker.  She is scheduled for outpatient physical therapy this afternoon.  She has been compliant taking a baby aspirin twice daily for DVT prophylaxis.  She is taking Percocet and Robaxin for pain.  Examination of her right knee reveals a well-healed surgical incision with nylon sutures in place.  No evidence of infection or cellulitis.  Calves are soft and nontender.  Today, sutures were removed and Steri-Strips applied.  I refilled her pain medicine.  Continue with a baby aspirin twice daily for another 4 weeks.  Follow-up in 4 weeks for repeat evaluation and 2 view x-rays of the right knee.  Call with concerns or questions.  Follow-Up Instructions: Return in about 4 weeks (around 02/04/2023).   Orders:  No orders of the defined types were placed in this encounter.  No orders of the defined types were placed in this encounter.   Imaging: No new imaging  PMFS History: Patient Active Problem List   Diagnosis Date Noted   Status post total right knee replacement 12/23/2022   Primary osteoarthritis of right knee 12/22/2022   History of GI bleed 06/06/2022   Osteopenia 08/30/2021   Aortic atherosclerosis 07/12/2021   Diverticulosis 07/12/2021   Leukocytosis 07/05/2021   GAD (generalized anxiety disorder) 07/05/2021   Personal history of calcium pyrophosphate deposition disease (CPPD) 04/11/2020   History of vitamin D deficiency  07/26/2019   Age-related cataract of both eyes 05/19/2018   Hyperlipidemia 05/19/2018   CKD (chronic kidney disease) stage 3, GFR 30-59 ml/min 02/14/2018   Essential hypertension 05/12/2017   Transient cerebral ischemia 05/12/2017   Seasonal allergic rhinitis due to pollen 12/29/2014   GERD (gastroesophageal reflux disease) 01/17/2014   Personal history of colonic adenoma 08/09/2008   Past Medical History:  Diagnosis Date   Arthritis    Cataract    Chronic kidney disease    Diverticulosis    Dyslipidemia    GERD (gastroesophageal reflux disease)    HTN (hypertension) 2015   Personal history of colonic adenoma 08/09/2008   Vitamin D deficiency     Family History  Problem Relation Age of Onset   Heart disease Sister    Heart disease Sister        in her 15s   Colon cancer Neg Hx     Past Surgical History:  Procedure Laterality Date   ABDOMINAL HYSTERECTOMY  1975   APPENDECTOMY  1975   COLONOSCOPY     HAND SURGERY     Cyst removal   THYROID SURGERY  2000   to remove nodule   TOTAL KNEE ARTHROPLASTY Right 12/23/2022   Procedure: RIGHT TOTAL KNEE ARTHROPLASTY;  Surgeon: Tarry Kos, MD;  Location: [facility] OR;  Service: Orthopedics;  Laterality: Right;   Social History   Occupational History   Occupation: Retired from  [employer]  Tobacco Use   Smoking status: Never   Smokeless tobacco: Never  Vaping Use   Vaping Use: Never used  Substance and Sexual Activity   Alcohol use: Yes    Alcohol/week: 1.0 standard drink of alcohol    Types: 1 Glasses of wine per week    Comment: socially   Drug use: No   Sexual activity: Yes

## 2023-01-07 NOTE — Therapy (Signed)
OUTPATIENT PHYSICAL THERAPY LOWER EXTREMITY  EVALUATION  Patient Name: NEKESHA FONT MRN: [id number] DOB:November 09, 1940, 82 y.o., female Today's Date: 01/07/2023  END OF SESSION:  PT End of Session - 01/07/23 1351     Visit Number 1    Number of Visits 24    Date for PT Re-Evaluation 04/04/23    Progress Note Due on Visit 10    PT Start Time 1345    PT Stop Time 1430    PT Time Calculation (min) 45 min    Activity Tolerance Patient tolerated treatment well    Behavior During Therapy [facility] for tasks assessed/performed             Past Medical History:  Diagnosis Date   Arthritis    Cataract    Chronic kidney disease    Diverticulosis    Dyslipidemia    GERD (gastroesophageal reflux disease)    HTN (hypertension) 2015   Personal history of colonic adenoma 08/09/2008   Vitamin D deficiency    Past Surgical History:  Procedure Laterality Date   ABDOMINAL HYSTERECTOMY  1975   APPENDECTOMY  1975   COLONOSCOPY     HAND SURGERY     Cyst removal   THYROID SURGERY  2000   to remove nodule   TOTAL KNEE ARTHROPLASTY Right 12/23/2022   Procedure: RIGHT TOTAL KNEE ARTHROPLASTY;  Surgeon: Tarry Kos, MD;  Location: [facility] OR;  Service: Orthopedics;  Laterality: Right;   Patient Active Problem List   Diagnosis Date Noted   Status post total right knee replacement 12/23/2022   Primary osteoarthritis of right knee 12/22/2022   History of GI bleed 06/06/2022   Osteopenia 08/30/2021   Aortic atherosclerosis 07/12/2021   Diverticulosis 07/12/2021   Leukocytosis 07/05/2021   GAD (generalized anxiety disorder) 07/05/2021   Personal history of calcium pyrophosphate deposition disease (CPPD) 04/11/2020   History of vitamin D deficiency 07/26/2019   Age-related cataract of both eyes 05/19/2018   Hyperlipidemia 05/19/2018   CKD (chronic kidney disease) stage 3, GFR 30-59 ml/min 02/14/2018   Essential hypertension 05/12/2017   Transient cerebral ischemia 05/12/2017   Seasonal  allergic rhinitis due to pollen 12/29/2014   GERD (gastroesophageal reflux disease) 01/17/2014   Personal history of colonic adenoma 08/09/2008    PCP:  Ronnald Nian, MD   REFERRING PROVIDER: Tarry Kos, MD   REFERRING DIAG: -10-CM) - Primary osteoarthritis of right knee Z96.651 (ICD-10-CM) - Status post total right knee replacement  THERAPY DIAG:  Acute pain of right knee  Stiffness of right knee, not elsewhere classified  Difficulty in walking, not elsewhere classified  Muscle weakness (generalized)  Localized edema  Rationale for Evaluation and Treatment: Rehabilitation  ONSET DATE: 12/23/22  SUBJECTIVE:   SUBJECTIVE STATEMENT: 82 y.o. female presents s/p Rt TKA on 12/23/22. Pt amb with RW. Pt reporting 6/10 pain in her Rt knee.   PERTINENT HISTORY: Rt TKA on 12/23/22.OA, cataract, CKD, diverticulosis, dyslipidemia, HTN, GERD.  PAIN:  NPRS scale: 6 /10 Pain location: Rt knee Pain description: achy, tightness, throbbing Aggravating factors: bending, staying in one position Relieving factors: pain meds, ice  PRECAUTIONS: None  WEIGHT BEARING RESTRICTIONS: No  FALLS:  Has patient fallen in last 6 months? No  LIVING ENVIRONMENT: Lives with: alone Lives in: House/apartment Stairs: Yes: External: 1 steps; none Has following equipment at home: Dan Humphreys - 2 wheeled  OCCUPATION: retired  PLOF: Independent  PATIENT GOALS: Walk without pain  Next MD visit: no follow up scheduled at this  time   OBJECTIVE:   DIAGNOSTIC FINDINGS:  IMPRESSION: Postsurgical changes from right knee arthroplasty including subcutaneous and intra-articular air, as well as a joint effusion.  PATIENT SURVEYS:  01/07/23: FOTO intake:  40% , predicted 57%  COGNITION: Overall cognitive status: WFL    SENSATION: WFL  EDEMA:  01/07/23: Circumferential: Rt: 45 centimeters , Left: 42 centimeters   POSTURE: rounded shoulders, forward head, and decreased lumbar  lordosis  PALPATION: 01/07/23: Pt with noted pain with palpation around med/lateral joint line of Rt knee, noted tenderness in distal hamstring tendons  LOWER EXTREMITY ROM:   ROM Right 01/07/23 Left 01/07/23  Hip flexion    Hip abduction    Hip adduction    Knee flexion 80 110  Knee extension    Ankle dorsiflexion    Ankle plantarflexion     (Blank rows = not tested)  LOWER EXTREMITY MMT:  MMT Right 01/07/23 Left 01/07/23  Hip flexion 4 5  Hip extension    Hip abduction    Hip adduction    Knee flexion 3 5  Knee extension 3 5  Ankle dorsiflexion    Ankle plantarflexion     (Blank rows = not tested)    FUNCTIONAL TESTS:  01/07/23: 5 time sit to stand: 24 sec c UE support  GAIT: Distance walked: 20 feet  Assistive device utilized: Walker - 2 wheeled Level of assistance: Modified independence Comments: decreased step length, decreased wt shift to Rt    TODAY'S TREATMENT                                                                          DATE: 01/07/23:  Therex: HEP instruction/performance c cues for techniques, handout provided.  Trial set performed of each for comprehension and symptom assessment.  See below for exercise list Modalities:  Vasopneumatic: 34 deg, low compression x 10 minutes, Rt LE elevated on bolster   PATIENT EDUCATION:  Education details: HEP, POC Person educated: Patient Education method: Programmer, multimedia, Demonstration, Verbal cues, and Handouts Education comprehension: verbalized understanding, returned demonstration, and verbal cues required  HOME EXERCISE PROGRAM: Access Code: [email or web address] URL: [email or web address] Date: 01/07/2023 Prepared by: Narda Amber  Exercises - Supine Quadricep Sets  - 2-3 x daily - 7 x weekly - 15 reps - Supine Active Straight Leg Raise  - 2-3 x daily - 7 x weekly - 15 reps - Supine Heel Slides  - 2-3 x daily - 7 x weekly - 15 reps - Seated Long Arc Quad  - 2-3 x daily - 7 x weekly - 15  reps - Sit to Stand with Counter Support  - 2-3 x daily - 7 x weekly - 10 reps  ASSESSMENT:  CLINICAL IMPRESSION: Patient is a 82 y.o. who comes to clinic with complaints of Rt pain s/p Rt TKA with mobility, strength and movement coordination deficits that impair their ability to perform usual daily and recreational functional activities without increase difficulty/symptoms at this time.  Patient to benefit from skilled PT services to address impairments and limitations to improve to previous level of function without restriction secondary to condition.   OBJECTIVE IMPAIRMENTS: decreased activity tolerance, decreased balance, decreased mobility, difficulty walking, decreased ROM, decreased strength, increased edema, impaired flexibility,  and pain.   ACTIVITY LIMITATIONS: sitting, standing, squatting, sleeping, stairs, and transfers  PARTICIPATION LIMITATIONS: meal prep, cleaning, laundry, and community activity  PERSONAL FACTORS: Age and 3+ comorbidities: see above  are also affecting patient's functional outcome.   REHAB POTENTIAL: Good  CLINICAL DECISION MAKING: Stable/uncomplicated  EVALUATION COMPLEXITY: Low   GOALS: Goals reviewed with patient? Yes  SHORT TERM GOALS: (target date for Short term goals are 3 weeks 01/31/23)   1.  Patient will demonstrate independent use of home exercise program to maintain progress from in clinic treatments.  Goal status: New  LONG TERM GOALS: (target dates for all long term goals are 12 weeks  04/04/23 )   1. Patient will demonstrate/report pain at worst less than or equal to 2/10 to facilitate minimal limitation in daily activity secondary to pain symptoms.  Goal status: New   2. Patient will demonstrate independent use of home exercise program to facilitate ability to maintain/progress functional gains from skilled physical therapy services.  Goal status: New   3. Patient will demonstrate FOTO outcome > or = 57 % to indicate reduced  disability due to condition.  Goal status: New   4.  Patient will demonstrate Rt LE MMT >/= 4/5 throughout to faciltiate usual transfers, stairs, squatting at [facility] for daily life.   Goal status: New   5.  Patient will demonstrate improved sit to stand to </= 14 seconds with no UE support.   Goal status: New   6.  Pt will be able to amb in community around neighborhood for >/= 20 minutes safely.    Goal status: New  7. Pt will be able to navigate up and down on curb step with no device safely.    Goal status: New      PLAN:  PT FREQUENCY: 1-2x/week  PT DURATION: 10 weeks  PLANNED INTERVENTIONS: Therapeutic exercises, Therapeutic activity, Neuro Muscular re-education, Balance training, Gait training, Patient/Family education, Joint mobilization, Stair training, DME instructions, Dry Needling, Electrical stimulation, Traction, Cryotherapy, vasopneumatic deviceMoist heat, Taping, Ultrasound, Ionotophoresis 4mg /ml Dexamethasone, and aquatic therapy, Manual therapy.  All included unless contraindicated  PLAN FOR NEXT SESSION: Review HEP knowledge/results, Manual knee ROM, Quad strengthening, vasopneumatic    Sharmon Leyden, PT, MPT 01/07/2023, 1:53 PM

## 2023-01-08 ENCOUNTER — Telehealth: Payer: Self-pay | Admitting: *Deleted

## 2023-01-08 NOTE — Telephone Encounter (Signed)
Ortho bundle 14 day in office meeting completed on 01/07/23.

## 2023-01-10 ENCOUNTER — Other Ambulatory Visit: Payer: Self-pay | Admitting: Physician Assistant

## 2023-01-10 ENCOUNTER — Encounter: Payer: Self-pay | Admitting: Rehabilitative and Restorative Service Providers"

## 2023-01-10 ENCOUNTER — Ambulatory Visit: Payer: Medicare HMO | Admitting: Rehabilitative and Restorative Service Providers"

## 2023-01-10 DIAGNOSIS — M25561 Pain in right knee: Secondary | ICD-10-CM | POA: Diagnosis not present

## 2023-01-10 DIAGNOSIS — M25661 Stiffness of right knee, not elsewhere classified: Secondary | ICD-10-CM | POA: Diagnosis not present

## 2023-01-10 DIAGNOSIS — M6281 Muscle weakness (generalized): Secondary | ICD-10-CM | POA: Diagnosis not present

## 2023-01-10 DIAGNOSIS — R600 Localized edema: Secondary | ICD-10-CM | POA: Diagnosis not present

## 2023-01-10 DIAGNOSIS — R262 Difficulty in walking, not elsewhere classified: Secondary | ICD-10-CM

## 2023-01-10 NOTE — Therapy (Signed)
OUTPATIENT PHYSICAL THERAPY LOWER EXTREMITY  TREATMENT  Patient Name: Cristina Sawyer MRN: [id number] DOB:29-Mar-1941, 82 y.o., female Today's Date: 01/10/2023  END OF SESSION:  PT End of Session - 01/10/23 1508     Visit Number 2    Number of Visits 24    Date for PT Re-Evaluation 04/04/23    Progress Note Due on Visit 10    PT Start Time 1508    PT Stop Time 1605    PT Time Calculation (min) 57 min    Activity Tolerance Patient tolerated treatment well;No increased pain;Patient limited by pain    Behavior During Therapy [facility] for tasks assessed/performed             Past Medical History:  Diagnosis Date   Arthritis    Cataract    Chronic kidney disease    Diverticulosis    Dyslipidemia    GERD (gastroesophageal reflux disease)    HTN (hypertension) 2015   Personal history of colonic adenoma 08/09/2008   Vitamin D deficiency    Past Surgical History:  Procedure Laterality Date   ABDOMINAL HYSTERECTOMY  1975   APPENDECTOMY  1975   COLONOSCOPY     HAND SURGERY     Cyst removal   THYROID SURGERY  2000   to remove nodule   TOTAL KNEE ARTHROPLASTY Right 12/23/2022   Procedure: RIGHT TOTAL KNEE ARTHROPLASTY;  Surgeon: Tarry Kos, MD;  Location: [facility] OR;  Service: Orthopedics;  Laterality: Right;   Patient Active Problem List   Diagnosis Date Noted   Status post total right knee replacement 12/23/2022   Primary osteoarthritis of right knee 12/22/2022   History of GI bleed 06/06/2022   Osteopenia 08/30/2021   Aortic atherosclerosis (HCC) 07/12/2021   Diverticulosis 07/12/2021   Leukocytosis 07/05/2021   GAD (generalized anxiety disorder) 07/05/2021   Personal history of calcium pyrophosphate deposition disease (CPPD) 04/11/2020   History of vitamin D deficiency 07/26/2019   Age-related cataract of both eyes 05/19/2018   Hyperlipidemia 05/19/2018   CKD (chronic kidney disease) stage 3, GFR 30-59 ml/min (HCC) 02/14/2018   Essential hypertension 05/12/2017    Transient cerebral ischemia 05/12/2017   Seasonal allergic rhinitis due to pollen 12/29/2014   GERD (gastroesophageal reflux disease) 01/17/2014   Personal history of colonic adenoma 08/09/2008    PCP:  Ronnald Nian, MD   REFERRING PROVIDER: Tarry Kos, MD   REFERRING DIAG: -10-CM) - Primary osteoarthritis of right knee Z96.651 (ICD-10-CM) - Status post total right knee replacement  THERAPY DIAG:  Acute pain of right knee  Stiffness of right knee, not elsewhere classified  Difficulty in walking, not elsewhere classified  Muscle weakness (generalized)  Localized edema  Rationale for Evaluation and Treatment: Rehabilitation  ONSET DATE: 12/23/22  SUBJECTIVE:   SUBJECTIVE STATEMENT: Surgery was 3 weeks ago Monday.  Lam is still having trouble sleeping.  She reports early HEP compliance.  PERTINENT HISTORY: Rt TKA on 12/23/22.OA, cataract, CKD, diverticulosis, dyslipidemia, HTN, GERD.  PAIN:  NPRS scale: 3-6/10 Pain location: Rt knee Pain description: achy, tightness, throbbing Aggravating factors: bending, staying in one position Relieving factors: pain meds, ice  PRECAUTIONS: None  WEIGHT BEARING RESTRICTIONS: No  FALLS:  Has patient fallen in last 6 months? No  LIVING ENVIRONMENT: Lives with: alone Lives in: House/apartment Stairs: Yes: External: 1 steps; none Has following equipment at home: Dan Humphreys - 2 wheeled  OCCUPATION: retired  PLOF: Independent  PATIENT GOALS: Walk without pain  Next MD visit: no follow up  scheduled at this time   OBJECTIVE:   DIAGNOSTIC FINDINGS:  IMPRESSION: Postsurgical changes from right knee arthroplasty including subcutaneous and intra-articular air, as well as a joint effusion.  PATIENT SURVEYS:  01/07/23: FOTO intake:  40% , predicted 57%  COGNITION: Overall cognitive status: WFL    SENSATION: WFL  EDEMA:  01/07/23: Circumferential: Rt: 45 centimeters , Left: 42 centimeters   POSTURE: rounded  shoulders, forward head, and decreased lumbar lordosis  PALPATION: 01/07/23: Pt with noted pain with palpation around med/lateral joint line of Rt knee, noted tenderness in distal hamstring tendons  LOWER EXTREMITY ROM:   ROM Right 01/07/23 Left 01/07/23 Right 01/10/23  Hip flexion     Hip abduction     Hip adduction     Knee flexion 80 110 Passive 98  Knee extension   -10  Ankle dorsiflexion     Ankle plantarflexion      (Blank rows = not tested)  LOWER EXTREMITY MMT:  MMT Right 01/07/23 Left 01/07/23  Hip flexion 4 5  Hip extension    Hip abduction    Hip adduction    Knee flexion 3 5  Knee extension 3 5  Ankle dorsiflexion    Ankle plantarflexion     (Blank rows = not tested)    FUNCTIONAL TESTS:  01/07/23: 5 time sit to stand: 24 sec c UE support  GAIT: Distance walked: 20 feet  Assistive device utilized: Environmental consultant - 2 wheeled Level of assistance: Modified independence Comments: decreased step length, decreased wt shift to Rt    TODAY'S TREATMENT                                                                          DATE: 01/10/2023: Recumbent bike Seat 5 for 8 minutes Quadriceps sets with right heel prop 10X 5 seconds Supine straight leg raises (no quad lag) 10X 3 seconds Supine knee flexion (added belt) 10X 10 seconds Discussed seated LAQ and sit to stand (out of time)  Functional Activities: Double Leg Press 50# 15X slow eccentrics, full extension and stretch into flexion  Vaso right knee 10 minutes Medium 34*   01/07/23:  Therex: HEP instruction/performance c cues for techniques, handout provided.  Trial set performed of each for comprehension and symptom assessment.  See below for exercise list Modalities:  Vasopneumatic: 34 deg, low compression x 10 minutes, Rt LE elevated on bolster   PATIENT EDUCATION:  Education details: HEP, POC Person educated: Patient Education method: Programmer, multimedia, Demonstration, Verbal cues, and Handouts Education  comprehension: verbalized understanding, returned demonstration, and verbal cues required  HOME EXERCISE PROGRAM: Access Code: [email or web address] URL: [email or web address] Date: 01/07/2023 Prepared by: Narda Amber  Exercises - Supine Quadricep Sets  - 2-3 x daily - 7 x weekly - 15 reps - Supine Active Straight Leg Raise  - 2-3 x daily - 7 x weekly - 15 reps - Supine Heel Slides  - 2-3 x daily - 7 x weekly - 15 reps - Seated Long Arc Quad  - 2-3 x daily - 7 x weekly - 15 reps - Sit to Stand with Counter Support  - 2-3 x daily - 7 x weekly - 10 reps  ASSESSMENT:  CLINICAL IMPRESSION: Evelyn reports early HEP  compliance.  Her effort in the clinic was excellent.  AROM was 10-0-98 (passive flexion) today.  We reviewed her HEP and reinforced the emphasis on AROM (extension and flexion) and edema control.  Continue POC to meet LTGs.  OBJECTIVE IMPAIRMENTS: decreased activity tolerance, decreased balance, decreased mobility, difficulty walking, decreased ROM, decreased strength, increased edema, impaired flexibility, and pain.   ACTIVITY LIMITATIONS: sitting, standing, squatting, sleeping, stairs, and transfers  PARTICIPATION LIMITATIONS: meal prep, cleaning, laundry, and community activity  PERSONAL FACTORS: Age and 3+ comorbidities: see above  are also affecting patient's functional outcome.   REHAB POTENTIAL: Good  CLINICAL DECISION MAKING: Stable/uncomplicated  EVALUATION COMPLEXITY: Low   GOALS: Goals reviewed with patient? Yes  SHORT TERM GOALS: (target date for Short term goals are 3 weeks 01/31/23)   1.  Patient will demonstrate independent use of home exercise program to maintain progress from in clinic treatments.  Goal status: New  LONG TERM GOALS: (target dates for all long term goals are 12 weeks  04/04/23 )   1. Patient will demonstrate/report pain at worst less than or equal to 2/10 to facilitate minimal limitation in daily activity secondary to pain  symptoms.  Goal status: New   2. Patient will demonstrate independent use of home exercise program to facilitate ability to maintain/progress functional gains from skilled physical therapy services.  Goal status: New   3. Patient will demonstrate FOTO outcome > or = 57 % to indicate reduced disability due to condition.  Goal status: New   4.  Patient will demonstrate Rt LE MMT >/= 4/5 throughout to faciltiate usual transfers, stairs, squatting at [facility] for daily life.   Goal status: New   5.  Patient will demonstrate improved sit to stand to </= 14 seconds with no UE support.   Goal status: New   6.  Pt will be able to amb in community around neighborhood for >/= 20 minutes safely.    Goal status: New  7. Pt will be able to navigate up and down on curb step with no device safely.    Goal status: New      PLAN:  PT FREQUENCY: 1-2x/week  PT DURATION: 10 weeks  PLANNED INTERVENTIONS: Therapeutic exercises, Therapeutic activity, Neuro Muscular re-education, Balance training, Gait training, Patient/Family education, Joint mobilization, Stair training, DME instructions, Dry Needling, Electrical stimulation, Traction, Cryotherapy, vasopneumatic deviceMoist heat, Taping, Ultrasound, Ionotophoresis 4mg /ml Dexamethasone, and aquatic therapy, Manual therapy.  All included unless contraindicated  PLAN FOR NEXT SESSION: Review HEP knowledge/results, Manual knee ROM (extension and flexion), Quad strengthening, vasopneumatic    Cherlyn Cushing, PT, MPT 01/10/2023, 4:02 PM

## 2023-01-13 ENCOUNTER — Ambulatory Visit: Payer: Medicare HMO | Admitting: Physical Therapy

## 2023-01-13 ENCOUNTER — Encounter: Payer: Self-pay | Admitting: Physical Therapy

## 2023-01-13 DIAGNOSIS — M25661 Stiffness of right knee, not elsewhere classified: Secondary | ICD-10-CM | POA: Diagnosis not present

## 2023-01-13 DIAGNOSIS — M6281 Muscle weakness (generalized): Secondary | ICD-10-CM

## 2023-01-13 DIAGNOSIS — R600 Localized edema: Secondary | ICD-10-CM

## 2023-01-13 DIAGNOSIS — R262 Difficulty in walking, not elsewhere classified: Secondary | ICD-10-CM

## 2023-01-13 DIAGNOSIS — M25561 Pain in right knee: Secondary | ICD-10-CM

## 2023-01-13 NOTE — Therapy (Signed)
OUTPATIENT PHYSICAL THERAPY LOWER EXTREMITY  TREATMENT  Patient Name: CRIS GIBBY MRN: [id number] DOB:December 05, 1940, 82 y.o., female Today's Date: 01/13/2023  END OF SESSION:  PT End of Session - 01/13/23 1355     Visit Number 3    Number of Visits 24    Date for PT Re-Evaluation 04/04/23    Progress Note Due on Visit 10    PT Start Time 1348    PT Stop Time 1427    PT Time Calculation (min) 39 min    Activity Tolerance Patient tolerated treatment well;No increased pain;Patient limited by pain    Behavior During Therapy [facility] for tasks assessed/performed              Past Medical History:  Diagnosis Date   Arthritis    Cataract    Chronic kidney disease    Diverticulosis    Dyslipidemia    GERD (gastroesophageal reflux disease)    HTN (hypertension) 2015   Personal history of colonic adenoma 08/09/2008   Vitamin D deficiency    Past Surgical History:  Procedure Laterality Date   ABDOMINAL HYSTERECTOMY  1975   APPENDECTOMY  1975   COLONOSCOPY     HAND SURGERY     Cyst removal   THYROID SURGERY  2000   to remove nodule   TOTAL KNEE ARTHROPLASTY Right 12/23/2022   Procedure: RIGHT TOTAL KNEE ARTHROPLASTY;  Surgeon: Tarry Kos, MD;  Location: [facility] OR;  Service: Orthopedics;  Laterality: Right;   Patient Active Problem List   Diagnosis Date Noted   Status post total right knee replacement 12/23/2022   Primary osteoarthritis of right knee 12/22/2022   History of GI bleed 06/06/2022   Osteopenia 08/30/2021   Aortic atherosclerosis (HCC) 07/12/2021   Diverticulosis 07/12/2021   Leukocytosis 07/05/2021   GAD (generalized anxiety disorder) 07/05/2021   Personal history of calcium pyrophosphate deposition disease (CPPD) 04/11/2020   History of vitamin D deficiency 07/26/2019   Age-related cataract of both eyes 05/19/2018   Hyperlipidemia 05/19/2018   CKD (chronic kidney disease) stage 3, GFR 30-59 ml/min (HCC) 02/14/2018   Essential hypertension 05/12/2017    Transient cerebral ischemia 05/12/2017   Seasonal allergic rhinitis due to pollen 12/29/2014   GERD (gastroesophageal reflux disease) 01/17/2014   Personal history of colonic adenoma 08/09/2008    PCP:  Ronnald Nian, MD   REFERRING PROVIDER: Tarry Kos, MD   REFERRING DIAG: -10-CM) - Primary osteoarthritis of right knee Z96.651 (ICD-10-CM) - Status post total right knee replacement  THERAPY DIAG:  Acute pain of right knee  Stiffness of right knee, not elsewhere classified  Difficulty in walking, not elsewhere classified  Muscle weakness (generalized)  Localized edema  Rationale for Evaluation and Treatment: Rehabilitation  ONSET DATE: 12/23/22  SUBJECTIVE:   SUBJECTIVE STATEMENT:  Knee is still bothering me but feeling a little better than it has been. Nothing else really going on, I do see a little improvement and I'm trying to do my therapy at home more. Doing HEP at least twice a day.   PERTINENT HISTORY: Rt TKA on 12/23/22.OA, cataract, CKD, diverticulosis, dyslipidemia, HTN, GERD.  PAIN:  NPRS scale: 3/10 Pain location: Rt knee Pain description: numbing pain Aggravating factors: not taking oxy as scheduled  Relieving factors: Ice and movement   PRECAUTIONS: None  WEIGHT BEARING RESTRICTIONS: No  FALLS:  Has patient fallen in last 6 months? No  LIVING ENVIRONMENT: Lives with: alone Lives in: House/apartment Stairs: Yes: External: 1 steps; none Has  following equipment at home: Dan Humphreys - 2 wheeled  OCCUPATION: retired  PLOF: Independent  PATIENT GOALS: Walk without pain  Next MD visit: no follow up scheduled at this time   OBJECTIVE:   DIAGNOSTIC FINDINGS:  IMPRESSION: Postsurgical changes from right knee arthroplasty including subcutaneous and intra-articular air, as well as a joint effusion.  PATIENT SURVEYS:  01/07/23: FOTO intake:  40% , predicted 57%  COGNITION: Overall cognitive status: WFL    SENSATION: WFL  EDEMA:  01/07/23:  Circumferential: Rt: 45 centimeters , Left: 42 centimeters   POSTURE: rounded shoulders, forward head, and decreased lumbar lordosis  PALPATION: 01/07/23: Pt with noted pain with palpation around med/lateral joint line of Rt knee, noted tenderness in distal hamstring tendons  LOWER EXTREMITY ROM:   ROM Right 01/07/23 Left 01/07/23 Right 01/10/23  Hip flexion     Hip abduction     Hip adduction     Knee flexion 80 110 Passive 98  Knee extension   -10  Ankle dorsiflexion     Ankle plantarflexion      (Blank rows = not tested)  LOWER EXTREMITY MMT:  MMT Right 01/07/23 Left 01/07/23  Hip flexion 4 5  Hip extension    Hip abduction    Hip adduction    Knee flexion 3 5  Knee extension 3 5  Ankle dorsiflexion    Ankle plantarflexion     (Blank rows = not tested)    FUNCTIONAL TESTS:  01/07/23: 5 time sit to stand: 24 sec c UE support  GAIT: Distance walked: 20 feet  Assistive device utilized: Environmental consultant - 2 wheeled Level of assistance: Modified independence Comments: decreased step length, decreased wt shift to Rt    TODAY'S TREATMENT                                                                          DATE:    01/13/23  TherEx  Scifit bike seat 8 partial rotations for 6 minutes SAQs over foam roll x15 with 3 second holds Supine SLRs x15  Bridges with progressive knee flexion 3x5  Supine knee flexion with AAROM assist from PT Gait training in clinic for heel-toe pattern, RW    Manual  Patella mobilizations all directions to tolerance   Retrograde massage with LEs elevated for edema control       01/10/2023: Recumbent bike Seat 5 for 8 minutes Quadriceps sets with right heel prop 10X 5 seconds Supine straight leg raises (no quad lag) 10X 3 seconds Supine knee flexion (added belt) 10X 10 seconds Discussed seated LAQ and sit to stand (out of time)  Functional Activities: Double Leg Press 50# 15X slow eccentrics, full extension and stretch into  flexion  Vaso right knee 10 minutes Medium 34*   01/07/23:  Therex: HEP instruction/performance c cues for techniques, handout provided.  Trial set performed of each for comprehension and symptom assessment.  See below for exercise list Modalities:  Vasopneumatic: 34 deg, low compression x 10 minutes, Rt LE elevated on bolster   PATIENT EDUCATION:  Education details: HEP, POC Person educated: Patient Education method: Programmer, multimedia, Demonstration, Verbal cues, and Handouts Education comprehension: verbalized understanding, returned demonstration, and verbal cues required  HOME EXERCISE PROGRAM: Access Code:  [email or web address] URL: [email or web address] Date: 01/07/2023 Prepared by: Narda Amber  Exercises - Supine Quadricep Sets  - 2-3 x daily - 7 x weekly - 15 reps - Supine Active Straight Leg Raise  - 2-3 x daily - 7 x weekly - 15 reps - Supine Heel Slides  - 2-3 x daily - 7 x weekly - 15 reps - Seated Long Arc Quad  - 2-3 x daily - 7 x weekly - 15 reps - Sit to Stand with Counter Support  - 2-3 x daily - 7 x weekly - 10 reps  ASSESSMENT:  CLINICAL IMPRESSION:  Ms. Linquist arrives today doing OK, knee is doing a bit better pain wise. We continued with focus on ROM and quad strengthening for now, also worked on some gait training to improve heel toe pattern. Will continue all interventions as tolerated.   OBJECTIVE IMPAIRMENTS: decreased activity tolerance, decreased balance, decreased mobility, difficulty walking, decreased ROM, decreased strength, increased edema, impaired flexibility, and pain.   ACTIVITY LIMITATIONS: sitting, standing, squatting, sleeping, stairs, and transfers  PARTICIPATION LIMITATIONS: meal prep, cleaning, laundry, and community activity  PERSONAL FACTORS: Age and 3+ comorbidities: see above  are also affecting patient's functional outcome.   REHAB POTENTIAL: Good  CLINICAL DECISION MAKING: Stable/uncomplicated  EVALUATION COMPLEXITY:  Low   GOALS: Goals reviewed with patient? Yes  SHORT TERM GOALS: (target date for Short term goals are 3 weeks 01/31/23)   1.  Patient will demonstrate independent use of home exercise program to maintain progress from in clinic treatments.  Goal status: New  LONG TERM GOALS: (target dates for all long term goals are 12 weeks  04/04/23 )   1. Patient will demonstrate/report pain at worst less than or equal to 2/10 to facilitate minimal limitation in daily activity secondary to pain symptoms.  Goal status: New   2. Patient will demonstrate independent use of home exercise program to facilitate ability to maintain/progress functional gains from skilled physical therapy services.  Goal status: New   3. Patient will demonstrate FOTO outcome > or = 57 % to indicate reduced disability due to condition.  Goal status: New   4.  Patient will demonstrate Rt LE MMT >/= 4/5 throughout to faciltiate usual transfers, stairs, squatting at [facility] for daily life.   Goal status: New   5.  Patient will demonstrate improved sit to stand to </= 14 seconds with no UE support.   Goal status: New   6.  Pt will be able to amb in community around neighborhood for >/= 20 minutes safely.    Goal status: New  7. Pt will be able to navigate up and down on curb step with no device safely.    Goal status: New      PLAN:  PT FREQUENCY: 1-2x/week  PT DURATION: 10 weeks  PLANNED INTERVENTIONS: Therapeutic exercises, Therapeutic activity, Neuro Muscular re-education, Balance training, Gait training, Patient/Family education, Joint mobilization, Stair training, DME instructions, Dry Needling, Electrical stimulation, Traction, Cryotherapy, vasopneumatic deviceMoist heat, Taping, Ultrasound, Ionotophoresis 4mg /ml Dexamethasone, and aquatic therapy, Manual therapy.  All included unless contraindicated  PLAN FOR NEXT SESSION: Review HEP knowledge/results, Manual knee ROM (extension and flexion), Quad  strengthening, vasopneumatic PRN   Nedra Hai PT DPT PN2

## 2023-01-15 ENCOUNTER — Ambulatory Visit: Payer: Medicare HMO | Admitting: Physical Therapy

## 2023-01-15 ENCOUNTER — Encounter: Payer: Self-pay | Admitting: Physical Therapy

## 2023-01-15 DIAGNOSIS — M6281 Muscle weakness (generalized): Secondary | ICD-10-CM | POA: Diagnosis not present

## 2023-01-15 DIAGNOSIS — M25561 Pain in right knee: Secondary | ICD-10-CM | POA: Diagnosis not present

## 2023-01-15 DIAGNOSIS — M25661 Stiffness of right knee, not elsewhere classified: Secondary | ICD-10-CM | POA: Diagnosis not present

## 2023-01-15 DIAGNOSIS — R262 Difficulty in walking, not elsewhere classified: Secondary | ICD-10-CM | POA: Diagnosis not present

## 2023-01-15 DIAGNOSIS — R600 Localized edema: Secondary | ICD-10-CM

## 2023-01-15 NOTE — Therapy (Signed)
OUTPATIENT PHYSICAL THERAPY LOWER EXTREMITY  TREATMENT  Patient Name: Cristina Sawyer MRN: [id number] DOB:10/03/1940, 82 y.o., female Today's Date: 01/15/2023  END OF SESSION:  PT End of Session - 01/15/23 1059     Visit Number 4    Number of Visits 24    Date for PT Re-Evaluation 04/04/23    Progress Note Due on Visit 10    PT Start Time 1100    PT Stop Time 1151    PT Time Calculation (min) 51 min    Activity Tolerance Patient tolerated treatment well;No increased pain;Patient limited by pain    Behavior During Therapy [facility] for tasks assessed/performed              Past Medical History:  Diagnosis Date   Arthritis    Cataract    Chronic kidney disease    Diverticulosis    Dyslipidemia    GERD (gastroesophageal reflux disease)    HTN (hypertension) 2015   Personal history of colonic adenoma 08/09/2008   Vitamin D deficiency    Past Surgical History:  Procedure Laterality Date   ABDOMINAL HYSTERECTOMY  1975   APPENDECTOMY  1975   COLONOSCOPY     HAND SURGERY     Cyst removal   THYROID SURGERY  2000   to remove nodule   TOTAL KNEE ARTHROPLASTY Right 12/23/2022   Procedure: RIGHT TOTAL KNEE ARTHROPLASTY;  Surgeon: Tarry Kos, MD;  Location: [facility] OR;  Service: Orthopedics;  Laterality: Right;   Patient Active Problem List   Diagnosis Date Noted   Status post total right knee replacement 12/23/2022   Primary osteoarthritis of right knee 12/22/2022   History of GI bleed 06/06/2022   Osteopenia 08/30/2021   Aortic atherosclerosis (HCC) 07/12/2021   Diverticulosis 07/12/2021   Leukocytosis 07/05/2021   GAD (generalized anxiety disorder) 07/05/2021   Personal history of calcium pyrophosphate deposition disease (CPPD) 04/11/2020   History of vitamin D deficiency 07/26/2019   Age-related cataract of both eyes 05/19/2018   Hyperlipidemia 05/19/2018   CKD (chronic kidney disease) stage 3, GFR 30-59 ml/min (HCC) 02/14/2018   Essential hypertension 05/12/2017    Transient cerebral ischemia 05/12/2017   Seasonal allergic rhinitis due to pollen 12/29/2014   GERD (gastroesophageal reflux disease) 01/17/2014   Personal history of colonic adenoma 08/09/2008    PCP:  Ronnald Nian, MD   REFERRING PROVIDER: Tarry Kos, MD   REFERRING DIAG: -10-CM) - Primary osteoarthritis of right knee Z96.651 (ICD-10-CM) - Status post total right knee replacement  THERAPY DIAG:  Acute pain of right knee  Stiffness of right knee, not elsewhere classified  Difficulty in walking, not elsewhere classified  Muscle weakness (generalized)  Localized edema  Rationale for Evaluation and Treatment: Rehabilitation  ONSET DATE: 12/23/22  SUBJECTIVE:   SUBJECTIVE STATEMENT: She has done exercises, ice for ~30 min and elevation.    PERTINENT HISTORY: Rt TKA on 12/23/22.OA, cataract, CKD, diverticulosis, dyslipidemia, HTN, GERD.  PAIN:  NPRS scale: today 6/10 and in last week 2/10 to 7-8/10 Pain location: Rt knee Pain description: numbing pain Aggravating factors: not taking oxy as scheduled  Relieving factors: Ice and movement   PRECAUTIONS: None  WEIGHT BEARING RESTRICTIONS: No  FALLS:  Has patient fallen in last 6 months? No  LIVING ENVIRONMENT: Lives with: alone Lives in: House/apartment Stairs: Yes: External: 1 steps; none Has following equipment at home: Dan Humphreys - 2 wheeled  OCCUPATION: retired  PLOF: Independent  PATIENT GOALS: Walk without pain  Next MD visit:  no follow up scheduled at this time  OBJECTIVE:   DIAGNOSTIC FINDINGS:  IMPRESSION: Postsurgical changes from right knee arthroplasty including subcutaneous and intra-articular air, as well as a joint effusion.  PATIENT SURVEYS:  01/07/23: FOTO intake:  40% , predicted 57%  COGNITION: Overall cognitive status: WFL    SENSATION: WFL  EDEMA:  01/07/23: Circumferential: Rt: 45 centimeters , Left: 42 centimeters   POSTURE: rounded shoulders, forward head, and decreased  lumbar lordosis  PALPATION: 01/07/23: Pt with noted pain with palpation around med/lateral joint line of Rt knee, noted tenderness in distal hamstring tendons  LOWER EXTREMITY ROM:   ROM Right 01/07/23 Left 01/07/23 Right 01/10/23 Right  01/15/23  Hip flexion      Hip abduction      Hip adduction      Knee flexion 80 110 Passive 98 Supine AA 100*   Knee extension   -10   Ankle dorsiflexion      Ankle plantarflexion       (Blank rows = not tested)  LOWER EXTREMITY MMT:  MMT Right 01/07/23 Left 01/07/23  Hip flexion 4 5  Hip extension    Hip abduction    Hip adduction    Knee flexion 3 5  Knee extension 3 5  Ankle dorsiflexion    Ankle plantarflexion     (Blank rows = not tested)    FUNCTIONAL TESTS:  01/07/23: 5 time sit to stand: 24 sec c UE support  GAIT: Distance walked: 20 feet  Assistive device utilized: Walker - 2 wheeled Level of assistance: Modified independence Comments: decreased step length, decreased wt shift to Rt    TODAY'S TREATMENT                                                                          DATE: 01/15/2023: TherEx Scifit bike seat 8 partial rotations for 6 minutes then full revolutions 2 min.  Gastroc stretch on incline board 30 sec 2 reps Heel raises 10 reps BUE support light for balance LAQs  & active knee flexion with contralateral LE opposing motion x15 with 3 second holds Hamstring stretch RLE long sit strap DF trunk flexion 30 sec 2 reps Bridges with progressive knee flexion 3x5  Supine knee flexion & active knee ext RLE on red therapy ball with strap with AAROM 10 reps PT recommended doing one exercise of HEP every 30 min during awake hours to limit stiffening.  Work her way thru whole program.   Neuromuscular Re-education Tandem stance on foam with RLE in front & in back 30 sec with intermittent touch Rocker board square with 2 pivots side to side holding level midline 1 min with light BUE support.   Self-Care: PT recommended  elevation with RLE higher than heart >/= 2x/day for >/= 15 min. Using ice for 10-15 min at time as often as needed.  Pt verbalized understanding. Weight shifting onto RLE upon arising for >/= 5 reps prior to walking.  Pt verbalized less pain.  Using pillows for positioning in bed. Pt verbalized understanding.   Manual PROM with overpressure and contract relax.   Vaso right knee 10 minutes Medium 34* with elevation.    01/13/23 TherEx  Scifit bike seat 8 partial  rotations for 6 minutes SAQs over foam roll x15 with 3 second holds Supine SLRs x15  Bridges with progressive knee flexion 3x5  Supine knee flexion with AAROM assist from PT Gait training in clinic for heel-toe pattern, RW    Manual  Patella mobilizations all directions to tolerance   Retrograde massage with LEs elevated for edema control    01/10/2023: Recumbent bike Seat 5 for 8 minutes Quadriceps sets with right heel prop 10X 5 seconds Supine straight leg raises (no quad lag) 10X 3 seconds Supine knee flexion (added belt) 10X 10 seconds Discussed seated LAQ and sit to stand (out of time)  Functional Activities: Double Leg Press 50# 15X slow eccentrics, full extension and stretch into flexion  Vaso right knee 10 minutes Medium 34*   PATIENT EDUCATION:  Education details: HEP, POC Person educated: Patient Education method: Programmer, multimedia, Demonstration, Verbal cues, and Handouts Education comprehension: verbalized understanding, returned demonstration, and verbal cues required  HOME EXERCISE PROGRAM: Access Code: [email or web address] URL: [email or web address] Date: 01/07/2023 Prepared by: Narda Amber  Exercises - Supine Quadricep Sets  - 2-3 x daily - 7 x weekly - 15 reps - Supine Active Straight Leg Raise  - 2-3 x daily - 7 x weekly - 15 reps - Supine Heel Slides  - 2-3 x daily - 7 x weekly - 15 reps - Seated Long Arc Quad  - 2-3 x daily - 7 x weekly - 15 reps - Sit to Stand with Counter Support  -  2-3 x daily - 7 x weekly - 10 reps  ASSESSMENT:  CLINICAL IMPRESSION: Patient improved knee range with manual therapy & exercises.  She verbalized understanding & appreciation for self-care instructions.  She continues to benefit from skilled PT to improve function following her TKA.   OBJECTIVE IMPAIRMENTS: decreased activity tolerance, decreased balance, decreased mobility, difficulty walking, decreased ROM, decreased strength, increased edema, impaired flexibility, and pain.   ACTIVITY LIMITATIONS: sitting, standing, squatting, sleeping, stairs, and transfers  PARTICIPATION LIMITATIONS: meal prep, cleaning, laundry, and community activity  PERSONAL FACTORS: Age and 3+ comorbidities: see above  are also affecting patient's functional outcome.   REHAB POTENTIAL: Good  CLINICAL DECISION MAKING: Stable/uncomplicated  EVALUATION COMPLEXITY: Low  GOALS: Goals reviewed with patient? Yes  SHORT TERM GOALS: (target date for Short term goals are 3 weeks 01/31/23)   1.  Patient will demonstrate independent use of home exercise program to maintain progress from in clinic treatments.  Goal status: New  LONG TERM GOALS: (target dates for all long term goals are 12 weeks  04/04/23 )   1. Patient will demonstrate/report pain at worst less than or equal to 2/10 to facilitate minimal limitation in daily activity secondary to pain symptoms.  Goal status: New   2. Patient will demonstrate independent use of home exercise program to facilitate ability to maintain/progress functional gains from skilled physical therapy services.  Goal status: New   3. Patient will demonstrate FOTO outcome > or = 57 % to indicate reduced disability due to condition.  Goal status: New   4.  Patient will demonstrate Rt LE MMT >/= 4/5 throughout to faciltiate usual transfers, stairs, squatting at [facility] for daily life.   Goal status: New   5.  Patient will demonstrate improved sit to stand to </= 14 seconds with  no UE support.   Goal status: New   6.  Pt will be able to amb in community around neighborhood for >/= 20 minutes safely.    Goal status:  New  7. Pt will be able to navigate up and down on curb step with no device safely.    Goal status: New    PLAN:  PT FREQUENCY: 1-2x/week  PT DURATION: 10 weeks  PLANNED INTERVENTIONS: Therapeutic exercises, Therapeutic activity, Neuro Muscular re-education, Balance training, Gait training, Patient/Family education, Joint mobilization, Stair training, DME instructions, Dry Needling, Electrical stimulation, Traction, Cryotherapy, vasopneumatic deviceMoist heat, Taping, Ultrasound, Ionotophoresis 4mg /ml Dexamethasone, and aquatic therapy, Manual therapy.  All included unless contraindicated  PLAN FOR NEXT SESSION: update HEP including adding muscle stretches,  Manual knee ROM (extension and flexion), Quad strengthening, vasopneumatic PRN    Vladimir Faster, PT, DPT 01/15/2023, 11:51 AM

## 2023-01-17 ENCOUNTER — Telehealth: Payer: Self-pay | Admitting: Orthopaedic Surgery

## 2023-01-17 ENCOUNTER — Encounter: Payer: Medicare HMO | Admitting: Rehabilitative and Restorative Service Providers"

## 2023-01-17 ENCOUNTER — Other Ambulatory Visit: Payer: Self-pay | Admitting: Surgical

## 2023-01-17 MED ORDER — OXYCODONE-ACETAMINOPHEN 5-325 MG PO TABS: 1.0000 | ORAL_TABLET | Freq: Three times a day (TID) | ORAL | 0 refills | Status: DC | PRN

## 2023-01-17 NOTE — Telephone Encounter (Signed)
Patient called. She would like a refill on oxycodone called in for her pain. Her cb# is [phone]

## 2023-01-17 NOTE — Telephone Encounter (Signed)
Sent in

## 2023-01-20 ENCOUNTER — Ambulatory Visit: Payer: Medicare HMO | Admitting: Physical Therapy

## 2023-01-20 ENCOUNTER — Encounter: Payer: Self-pay | Admitting: Physical Therapy

## 2023-01-20 DIAGNOSIS — R600 Localized edema: Secondary | ICD-10-CM | POA: Diagnosis not present

## 2023-01-20 DIAGNOSIS — R262 Difficulty in walking, not elsewhere classified: Secondary | ICD-10-CM | POA: Diagnosis not present

## 2023-01-20 DIAGNOSIS — M25561 Pain in right knee: Secondary | ICD-10-CM

## 2023-01-20 DIAGNOSIS — M6281 Muscle weakness (generalized): Secondary | ICD-10-CM

## 2023-01-20 DIAGNOSIS — M25661 Stiffness of right knee, not elsewhere classified: Secondary | ICD-10-CM | POA: Diagnosis not present

## 2023-01-20 NOTE — Therapy (Addendum)
OUTPATIENT PHYSICAL THERAPY LOWER EXTREMITY  TREATMENT  Patient Name: Cristina Sawyer MRN: [id number] DOB:February 17, 1941, 82 y.o., female Today's Date: 01/20/2023  END OF SESSION: Visit Number: 5 Number of visits: 24 Date of PT Re-evaluation: 04/04/23 Start Time: 1348 End Time: 1428 Total Time: 40 minutes    Past Medical History:  Diagnosis Date   Arthritis    Cataract    Chronic kidney disease    Diverticulosis    Dyslipidemia    GERD (gastroesophageal reflux disease)    HTN (hypertension) 2015   Personal history of colonic adenoma 08/09/2008   Vitamin D deficiency    Past Surgical History:  Procedure Laterality Date   ABDOMINAL HYSTERECTOMY  1975   APPENDECTOMY  1975   COLONOSCOPY     HAND SURGERY     Cyst removal   THYROID SURGERY  2000   to remove nodule   TOTAL KNEE ARTHROPLASTY Right 12/23/2022   Procedure: RIGHT TOTAL KNEE ARTHROPLASTY;  Surgeon: Tarry Kos, MD;  Location: [facility] OR;  Service: Orthopedics;  Laterality: Right;   Patient Active Problem List   Diagnosis Date Noted   Status post total right knee replacement 12/23/2022   Primary osteoarthritis of right knee 12/22/2022   History of GI bleed 06/06/2022   Osteopenia 08/30/2021   Aortic atherosclerosis (HCC) 07/12/2021   Diverticulosis 07/12/2021   Leukocytosis 07/05/2021   GAD (generalized anxiety disorder) 07/05/2021   Personal history of calcium pyrophosphate deposition disease (CPPD) 04/11/2020   History of vitamin D deficiency 07/26/2019   Age-related cataract of both eyes 05/19/2018   Hyperlipidemia 05/19/2018   CKD (chronic kidney disease) stage 3, GFR 30-59 ml/min (HCC) 02/14/2018   Essential hypertension 05/12/2017   Transient cerebral ischemia 05/12/2017   Seasonal allergic rhinitis due to pollen 12/29/2014   GERD (gastroesophageal reflux disease) 01/17/2014   Personal history of colonic adenoma 08/09/2008    PCP:  Ronnald Nian, MD   REFERRING PROVIDER: Tarry Kos, MD    REFERRING DIAG: -10-CM) - Primary osteoarthritis of right knee Z96.651 (ICD-10-CM) - Status post total right knee replacement  THERAPY DIAG:  No diagnosis found.  Rationale for Evaluation and Treatment: Rehabilitation  ONSET DATE: 12/23/22  SUBJECTIVE:   SUBJECTIVE STATEMENT: She has done exercises, ice for ~30 min and elevation.    PERTINENT HISTORY: Rt TKA on 12/23/22.OA, cataract, CKD, diverticulosis, dyslipidemia, HTN, GERD.  PAIN:  NPRS scale: today 6/10 and in last week 2/10 to 7-8/10 Pain location: Rt knee Pain description: numbing pain Aggravating factors: not taking oxy as scheduled  Relieving factors: Ice and movement   PRECAUTIONS: None  WEIGHT BEARING RESTRICTIONS: No  FALLS:  Has patient fallen in last 6 months? No  LIVING ENVIRONMENT: Lives with: alone Lives in: House/apartment Stairs: Yes: External: 1 steps; none Has following equipment at home: Walker - 2 wheeled  OCCUPATION: retired  PLOF: Independent  PATIENT GOALS: Walk without pain  Next MD visit: no follow up scheduled at this time  OBJECTIVE:   DIAGNOSTIC FINDINGS:  IMPRESSION: Postsurgical changes from right knee arthroplasty including subcutaneous and intra-articular air, as well as a joint effusion.  PATIENT SURVEYS:  01/07/23: FOTO intake:  40% , predicted 57%  COGNITION: Overall cognitive status: WFL    SENSATION: WFL  EDEMA:  01/07/23: Circumferential: Rt: 45 centimeters , Left: 42 centimeters   POSTURE: rounded shoulders, forward head, and decreased lumbar lordosis  PALPATION: 01/07/23: Pt with noted pain with palpation around med/lateral joint line of Rt knee, noted tenderness in distal  hamstring tendons  LOWER EXTREMITY ROM:   ROM Right 01/07/23 Left 01/07/23 Right 01/10/23 Right  01/15/23 Rt 01/20/23  Hip flexion       Hip abduction       Hip adduction       Knee flexion 80 110 Passive 98 Supine AA 100*  Sitting P: 100  Knee extension   -10    Ankle  dorsiflexion       Ankle plantarflexion        (Blank rows = not tested)  LOWER EXTREMITY MMT:  MMT Right 01/07/23 Left 01/07/23  Hip flexion 4 5  Hip extension    Hip abduction    Hip adduction    Knee flexion 3 5  Knee extension 3 5  Ankle dorsiflexion    Ankle plantarflexion     (Blank rows = not tested)    FUNCTIONAL TESTS:  01/07/23: 5 time sit to stand: 24 sec c UE support  GAIT: Distance walked: 20 feet  Assistive device utilized: Walker - 2 wheeled Level of assistance: Modified independence Comments: decreased step length, decreased wt shift to Rt    TODAY'S TREATMENT                                                                          DATE:  01/20/2023: TherEx Scifit bike seat 9 full revolutions x 8 minutes Calf stretch on slant board: x 2 holding 30 seconds Step ups x 15 leading with Rt LE c UE support Leg Press: bil LE 50 # 2 x 10, Rt LE : 18# x 10, 25# x 10 NMR:  Standing on Airex: feet apart x 30 sec, feet together x 30 sec, staggered stance x 30 sec c each LE  Stepping over TB strip on the floor working on wt shifting forward and back Cone tapping with UE support x 30 alternating Lateral stepping over 6 inch hurdle x 15 each direction c UE support   01/15/2023: TherEx Scifit bike seat 8 partial rotations for 6 minutes then full revolutions 2 min.  Gastroc stretch on incline board 30 sec 2 reps Heel raises 10 reps BUE support light for balance LAQs  & active knee flexion with contralateral LE opposing motion x15 with 3 second holds Hamstring stretch RLE long sit strap DF trunk flexion 30 sec 2 reps Bridges with progressive knee flexion 3x5  Supine knee flexion & active knee ext RLE on red therapy ball with strap with AAROM 10 reps PT recommended doing one exercise of HEP every 30 min during awake hours to limit stiffening.  Work her way thru whole program.   Neuromuscular Re-education Tandem stance on foam with RLE in front & in back 30 sec with  intermittent touch Rocker board square with 2 pivots side to side holding level midline 1 min with light BUE support.   Self-Care: PT recommended elevation with RLE higher than heart >/= 2x/day for >/= 15 min. Using ice for 10-15 min at time as often as needed.  Pt verbalized understanding. Weight shifting onto RLE upon arising for >/= 5 reps prior to walking.  Pt verbalized less pain.  Using pillows for positioning in bed. Pt verbalized understanding.   Manual PROM with overpressure  and contract relax.   Vaso right knee 10 minutes Medium 34* with elevation.    01/13/23 TherEx  Scifit bike seat 8 partial rotations for 6 minutes SAQs over foam roll x15 with 3 second holds Supine SLRs x15  Bridges with progressive knee flexion 3x5  Supine knee flexion with AAROM assist from PT Gait training in clinic for heel-toe pattern, RW    Manual  Patella mobilizations all directions to tolerance   Retrograde massage with LEs elevated for edema control    01/10/2023: Recumbent bike Seat 5 for 8 minutes Quadriceps sets with right heel prop 10X 5 seconds Supine straight leg raises (no quad lag) 10X 3 seconds Supine knee flexion (added belt) 10X 10 seconds Discussed seated LAQ and sit to stand (out of time)  Functional Activities: Double Leg Press 50# 15X slow eccentrics, full extension and stretch into flexion  Vaso right knee 10 minutes Medium 34*   PATIENT EDUCATION:  Education details: HEP, POC Person educated: Patient Education method: Programmer, multimedia, Demonstration, Verbal cues, and Handouts Education comprehension: verbalized understanding, returned demonstration, and verbal cues required  HOME EXERCISE PROGRAM: Access Code: [email or web address] URL: [email or web address] Date: 01/07/2023 Prepared by: Narda Amber  Exercises - Supine Quadricep Sets  - 2-3 x daily - 7 x weekly - 15 reps - Supine Active Straight Leg Raise  - 2-3 x daily - 7 x weekly - 15 reps - Supine  Heel Slides  - 2-3 x daily - 7 x weekly - 15 reps - Seated Long Arc Quad  - 2-3 x daily - 7 x weekly - 15 reps - Sit to Stand with Counter Support  - 2-3 x daily - 7 x weekly - 10 reps  ASSESSMENT:  CLINICAL IMPRESSION: Pt tolerating well with passive ROM in her Rt knee 100 degrees in sitting. Treatment focusing on dynamic balance and quad strengthening. Continue skilled PT progressing pt's functional mobility.   OBJECTIVE IMPAIRMENTS: decreased activity tolerance, decreased balance, decreased mobility, difficulty walking, decreased ROM, decreased strength, increased edema, impaired flexibility, and pain.   ACTIVITY LIMITATIONS: sitting, standing, squatting, sleeping, stairs, and transfers  PARTICIPATION LIMITATIONS: meal prep, cleaning, laundry, and community activity  PERSONAL FACTORS: Age and 3+ comorbidities: see above  are also affecting patient's functional outcome.   REHAB POTENTIAL: Good  CLINICAL DECISION MAKING: Stable/uncomplicated  EVALUATION COMPLEXITY: Low  GOALS: Goals reviewed with patient? Yes  SHORT TERM GOALS: (target date for Short term goals are 3 weeks 01/31/23)   1.  Patient will demonstrate independent use of home exercise program to maintain progress from in clinic treatments.  Goal status: New  LONG TERM GOALS: (target dates for all long term goals are 12 weeks  04/04/23 )   1. Patient will demonstrate/report pain at worst less than or equal to 2/10 to facilitate minimal limitation in daily activity secondary to pain symptoms.  Goal status: New   2. Patient will demonstrate independent use of home exercise program to facilitate ability to maintain/progress functional gains from skilled physical therapy services.  Goal status: New   3. Patient will demonstrate FOTO outcome > or = 57 % to indicate reduced disability due to condition.  Goal status: New   4.  Patient will demonstrate Rt LE MMT >/= 4/5 throughout to faciltiate usual transfers, stairs,  squatting at [facility] for daily life.   Goal status: New   5.  Patient will demonstrate improved sit to stand to </= 14 seconds with no UE support.   Goal status: New  6.  Pt will be able to amb in community around neighborhood for >/= 20 minutes safely.    Goal status: New  7. Pt will be able to navigate up and down on curb step with no device safely.    Goal status: New    PLAN:  PT FREQUENCY: 1-2x/week  PT DURATION: 10 weeks  PLANNED INTERVENTIONS: Therapeutic exercises, Therapeutic activity, Neuro Muscular re-education, Balance training, Gait training, Patient/Family education, Joint mobilization, Stair training, DME instructions, Dry Needling, Electrical stimulation, Traction, Cryotherapy, vasopneumatic deviceMoist heat, Taping, Ultrasound, Ionotophoresis 4mg /ml Dexamethasone, and aquatic therapy, Manual therapy.  All included unless contraindicated  PLAN FOR NEXT SESSION: Manual knee ROM (extension and flexion), Quad strengthening, vasopneumatic PRN    Narda Amber, PT, MPT 01/20/23 1:47 PM   01/20/2023, 1:47 PM

## 2023-01-22 ENCOUNTER — Encounter: Payer: Self-pay | Admitting: Physical Therapy

## 2023-01-22 ENCOUNTER — Ambulatory Visit: Payer: Medicare HMO | Admitting: Physical Therapy

## 2023-01-22 DIAGNOSIS — M25661 Stiffness of right knee, not elsewhere classified: Secondary | ICD-10-CM | POA: Diagnosis not present

## 2023-01-22 DIAGNOSIS — M6281 Muscle weakness (generalized): Secondary | ICD-10-CM

## 2023-01-22 DIAGNOSIS — M25561 Pain in right knee: Secondary | ICD-10-CM

## 2023-01-22 DIAGNOSIS — R262 Difficulty in walking, not elsewhere classified: Secondary | ICD-10-CM

## 2023-01-22 DIAGNOSIS — R600 Localized edema: Secondary | ICD-10-CM | POA: Diagnosis not present

## 2023-01-22 NOTE — Therapy (Signed)
OUTPATIENT PHYSICAL THERAPY LOWER EXTREMITY  TREATMENT  Patient Name: Cristina Sawyer MRN: [id number] DOB:1941/02/23, 82 y.o., female Today's Date: 01/22/2023  END OF SESSION:  PT End of Session - 01/22/23 1104     Visit Number 6    Number of Visits 24    Date for PT Re-Evaluation 04/04/23    Progress Note Due on Visit 10    PT Start Time 1100    PT Stop Time 1145    PT Time Calculation (min) 45 min    Activity Tolerance Patient tolerated treatment well;Patient limited by pain    Behavior During Therapy [facility] for tasks assessed/performed               Past Medical History:  Diagnosis Date   Arthritis    Cataract    Chronic kidney disease    Diverticulosis    Dyslipidemia    GERD (gastroesophageal reflux disease)    HTN (hypertension) 2015   Personal history of colonic adenoma 08/09/2008   Vitamin D deficiency    Past Surgical History:  Procedure Laterality Date   ABDOMINAL HYSTERECTOMY  1975   APPENDECTOMY  1975   COLONOSCOPY     HAND SURGERY     Cyst removal   THYROID SURGERY  2000   to remove nodule   TOTAL KNEE ARTHROPLASTY Right 12/23/2022   Procedure: RIGHT TOTAL KNEE ARTHROPLASTY;  Surgeon: Tarry Kos, MD;  Location: [facility] OR;  Service: Orthopedics;  Laterality: Right;   Patient Active Problem List   Diagnosis Date Noted   Status post total right knee replacement 12/23/2022   Primary osteoarthritis of right knee 12/22/2022   History of GI bleed 06/06/2022   Osteopenia 08/30/2021   Aortic atherosclerosis (HCC) 07/12/2021   Diverticulosis 07/12/2021   Leukocytosis 07/05/2021   GAD (generalized anxiety disorder) 07/05/2021   Personal history of calcium pyrophosphate deposition disease (CPPD) 04/11/2020   History of vitamin D deficiency 07/26/2019   Age-related cataract of both eyes 05/19/2018   Hyperlipidemia 05/19/2018   CKD (chronic kidney disease) stage 3, GFR 30-59 ml/min (HCC) 02/14/2018   Essential hypertension 05/12/2017   Transient cerebral  ischemia 05/12/2017   Seasonal allergic rhinitis due to pollen 12/29/2014   GERD (gastroesophageal reflux disease) 01/17/2014   Personal history of colonic adenoma 08/09/2008    PCP:  Ronnald Nian, MD   REFERRING PROVIDER: Tarry Kos, MD   REFERRING DIAG: -10-CM) - Primary osteoarthritis of right knee Z96.651 (ICD-10-CM) - Status post total right knee replacement  THERAPY DIAG:  Acute pain of right knee  Stiffness of right knee, not elsewhere classified  Difficulty in walking, not elsewhere classified  Muscle weakness (generalized)  Localized edema  Rationale for Evaluation and Treatment: Rehabilitation  ONSET DATE: 12/23/22  SUBJECTIVE:   SUBJECTIVE STATEMENT: Pt stating she stumbled this morning and felt like her Rt knee "jammed". Pt reporting no falls, just increased pain.   PERTINENT HISTORY: Rt TKA on 12/23/22.OA, cataract, CKD, diverticulosis, dyslipidemia, HTN, GERD.  PAIN:  NPRS scale: 5/10 pain  Pain location: Rt knee Pain description: numbing pain Aggravating factors: not taking oxy as scheduled  Relieving factors: Ice and movement   PRECAUTIONS: None  WEIGHT BEARING RESTRICTIONS: No  FALLS:  Has patient fallen in last 6 months? No  LIVING ENVIRONMENT: Lives with: alone Lives in: House/apartment Stairs: Yes: External: 1 steps; none Has following equipment at home: Dan Humphreys - 2 wheeled  OCCUPATION: retired  PLOF: Independent  PATIENT GOALS: Walk without pain  Next  MD visit: no follow up scheduled at this time  OBJECTIVE:   DIAGNOSTIC FINDINGS:  IMPRESSION: Postsurgical changes from right knee arthroplasty including subcutaneous and intra-articular air, as well as a joint effusion.  PATIENT SURVEYS:  01/07/23: FOTO intake:  40% , predicted 57%  COGNITION: Overall cognitive status: WFL    SENSATION: WFL  EDEMA:  01/07/23: Circumferential: Rt: 45 centimeters , Left: 42 centimeters   POSTURE: rounded shoulders, forward head, and  decreased lumbar lordosis  PALPATION: 01/07/23: Pt with noted pain with palpation around med/lateral joint line of Rt knee, noted tenderness in distal hamstring tendons  LOWER EXTREMITY ROM:   ROM Right 01/07/23 Left 01/07/23 Right 01/10/23 Right  01/15/23 Rt 01/20/23 Rt 01/22/23  Hip flexion        Hip abduction        Hip adduction        Knee flexion 80 110 Passive 98 Supine AA 100*  Sitting P: 100 Supine A: 95 P: 100  Knee extension   -10     Ankle dorsiflexion        Ankle plantarflexion         (Blank rows = not tested)  LOWER EXTREMITY MMT:  MMT Right 01/07/23 Left 01/07/23  Hip flexion 4 5  Hip extension    Hip abduction    Hip adduction    Knee flexion 3 5  Knee extension 3 5  Ankle dorsiflexion    Ankle plantarflexion     (Blank rows = not tested)    FUNCTIONAL TESTS:  01/07/23: 5 time sit to stand: 24 sec c UE support  GAIT: Distance walked: 20 feet  Assistive device utilized: Walker - 2 wheeled Level of assistance: Modified independence Comments: decreased step length, decreased wt shift to Rt    TODAY'S TREATMENT                                                                          DATE: 01/22/2023: TherEx Scifit bike seat 10 partial to  full revolutions x 5 minutes, seat 9 x 3 minutes full revolutions LAQ: 2 x 10 Rt LE 3#  Calf stretch on slant board: x 2 holding 30 seconds Step ups x 15 leading with Rt LE c UE support Leg Press: bil LE 50 # x 20, Rt LE :31# x 12 Seated heel slides using a pillow case around pt's Rt foot x 20 NMR:  Step tapping on 6 inch step x 20 alternating LE's Standing on Airex: stagger stance x 30 sec bil LE's, feet together x 30 sec Gait  Amb: 600 feet level surface with Straight Cane Manual: PROM: knee flexion/extension with overpressures Modalities;  Vasopneumatic: 34 degrees x 10 minutes, Rt LE , medium compression    01/20/2023: TherEx Scifit bike seat 9 full revolutions x 8 minutes Calf stretch on slant board: x  2 holding 30 seconds Step ups x 15 leading with Rt LE c UE support Leg Press: bil LE 50 # 2 x 10, Rt LE : 18# x 10, 25# x 10 NMR:  Standing on Airex: feet apart x 30 sec, feet together x 30 sec, staggered stance x 30 sec c each LE  Stepping over TB strip on the  floor working on wt shifting forward and back Cone tapping with UE support x 30 alternating Lateral stepping over 6 inch hurdle x 15 each direction c UE support   01/15/2023: TherEx Scifit bike seat 8 partial rotations for 6 minutes then full revolutions 2 min.  Gastroc stretch on incline board 30 sec 2 reps Heel raises 10 reps BUE support light for balance LAQs  & active knee flexion with contralateral LE opposing motion x15 with 3 second holds Hamstring stretch RLE long sit strap DF trunk flexion 30 sec 2 reps Bridges with progressive knee flexion 3x5  Supine knee flexion & active knee ext RLE on red therapy ball with strap with AAROM 10 reps PT recommended doing one exercise of HEP every 30 min during awake hours to limit stiffening.  Work her way thru whole program.   Neuromuscular Re-education Tandem stance on foam with RLE in front & in back 30 sec with intermittent touch Rocker board square with 2 pivots side to side holding level midline 1 min with light BUE support.   Self-Care: PT recommended elevation with RLE higher than heart >/= 2x/day for >/= 15 min. Using ice for 10-15 min at time as often as needed.  Pt verbalized understanding. Weight shifting onto RLE upon arising for >/= 5 reps prior to walking.  Pt verbalized less pain.  Using pillows for positioning in bed. Pt verbalized understanding.   Manual PROM with overpressure and contract relax.   Vaso right knee 10 minutes Medium 34* with elevation.    01/13/23 TherEx  Scifit bike seat 8 partial rotations for 6 minutes SAQs over foam roll x15 with 3 second holds Supine SLRs x15  Bridges with progressive knee flexion 3x5  Supine knee flexion with AAROM  assist from PT Gait training in clinic for heel-toe pattern, RW    Manual  Patella mobilizations all directions to tolerance   Retrograde massage with LEs elevated for edema control    01/10/2023: Recumbent bike Seat 5 for 8 minutes Quadriceps sets with right heel prop 10X 5 seconds Supine straight leg raises (no quad lag) 10X 3 seconds Supine knee flexion (added belt) 10X 10 seconds Discussed seated LAQ and sit to stand (out of time)  Functional Activities: Double Leg Press 50# 15X slow eccentrics, full extension and stretch into flexion  Vaso right knee 10 minutes Medium 34*   PATIENT EDUCATION:  Education details: HEP, POC Person educated: Patient Education method: Programmer, multimedia, Demonstration, Verbal cues, and Handouts Education comprehension: verbalized understanding, returned demonstration, and verbal cues required  HOME EXERCISE PROGRAM: Access Code: [email or web address] URL: [email or web address] Date: 01/07/2023 Prepared by: Narda Amber  Exercises - Supine Quadricep Sets  - 2-3 x daily - 7 x weekly - 15 reps - Supine Active Straight Leg Raise  - 2-3 x daily - 7 x weekly - 15 reps - Supine Heel Slides  - 2-3 x daily - 7 x weekly - 15 reps - Seated Long Arc Quad  - 2-3 x daily - 7 x weekly - 15 reps - Sit to Stand with Counter Support  - 2-3 x daily - 7 x weekly - 10 reps  ASSESSMENT:  CLINICAL IMPRESSION: Pt arriving today reporting she stumbled while walking this morning which caused increased knee pain in her Rt knee. Pt tolerating all exercises well focusing on ROM and quad strengthening. Pt is progressing with amb using a straight cane, but feels she still needs her RW for communtiy amb. Continue skilled PT to progress toward goals  set.   OBJECTIVE IMPAIRMENTS: decreased activity tolerance, decreased balance, decreased mobility, difficulty walking, decreased ROM, decreased strength, increased edema, impaired flexibility, and pain.   ACTIVITY  LIMITATIONS: sitting, standing, squatting, sleeping, stairs, and transfers  PARTICIPATION LIMITATIONS: meal prep, cleaning, laundry, and community activity  PERSONAL FACTORS: Age and 3+ comorbidities: see above  are also affecting patient's functional outcome.   REHAB POTENTIAL: Good  CLINICAL DECISION MAKING: Stable/uncomplicated  EVALUATION COMPLEXITY: Low  GOALS: Goals reviewed with patient? Yes  SHORT TERM GOALS: (target date for Short term goals are 3 weeks 01/31/23)   1.  Patient will demonstrate independent use of home exercise program to maintain progress from in clinic treatments.  Goal status: MET 01/22/23  LONG TERM GOALS: (target dates for all long term goals are 12 weeks  04/04/23 )   1. Patient will demonstrate/report pain at worst less than or equal to 2/10 to facilitate minimal limitation in daily activity secondary to pain symptoms.  Goal status:On-going: 01/22/23   2. Patient will demonstrate independent use of home exercise program to facilitate ability to maintain/progress functional gains from skilled physical therapy services.  Goal status: New   3. Patient will demonstrate FOTO outcome > or = 57 % to indicate reduced disability due to condition.  Goal status: New   4.  Patient will demonstrate Rt LE MMT >/= 4/5 throughout to faciltiate usual transfers, stairs, squatting at [facility] for daily life.   Goal status: New   5.  Patient will demonstrate improved sit to stand to </= 14 seconds with no UE support.   Goal status: New   6.  Pt will be able to amb in community around neighborhood for >/= 20 minutes safely.    Goal status: On-going 01/22/23  7. Pt will be able to navigate up and down on curb step with no device safely.    Goal status: New    PLAN:  PT FREQUENCY: 1-2x/week  PT DURATION: 10 weeks  PLANNED INTERVENTIONS: Therapeutic exercises, Therapeutic activity, Neuro Muscular re-education, Balance training, Gait training, Patient/Family  education, Joint mobilization, Stair training, DME instructions, Dry Needling, Electrical stimulation, Traction, Cryotherapy, vasopneumatic deviceMoist heat, Taping, Ultrasound, Ionotophoresis 4mg /ml Dexamethasone, and aquatic therapy, Manual therapy.  All included unless contraindicated  PLAN FOR NEXT SESSION: progress gait with st cane and then no device, Manual knee ROM (extension and flexion), Quad strengthening, vasopneumatic PRN    Narda Amber, PT, MPT 01/22/23 11:42 AM   01/22/2023, 11:42 AM

## 2023-01-24 ENCOUNTER — Encounter: Payer: Self-pay | Admitting: Physical Therapy

## 2023-01-24 ENCOUNTER — Ambulatory Visit: Payer: Medicare HMO | Admitting: Physical Therapy

## 2023-01-24 DIAGNOSIS — R262 Difficulty in walking, not elsewhere classified: Secondary | ICD-10-CM

## 2023-01-24 DIAGNOSIS — R600 Localized edema: Secondary | ICD-10-CM | POA: Diagnosis not present

## 2023-01-24 DIAGNOSIS — M25661 Stiffness of right knee, not elsewhere classified: Secondary | ICD-10-CM | POA: Diagnosis not present

## 2023-01-24 DIAGNOSIS — M6281 Muscle weakness (generalized): Secondary | ICD-10-CM

## 2023-01-24 DIAGNOSIS — M25561 Pain in right knee: Secondary | ICD-10-CM

## 2023-01-24 NOTE — Therapy (Signed)
OUTPATIENT PHYSICAL THERAPY LOWER EXTREMITY  TREATMENT  Patient Name: Cristina Sawyer MRN: [id number] DOB:[birth year], 82 y.o., female Today's Date: 01/24/2023  END OF SESSION:  PT End of Session - 01/24/23 1404     Visit Number 7    Number of Visits 24    Date for PT Re-Evaluation 04/04/23    Progress Note Due on Visit 10    PT Start Time 1348    PT Stop Time 1428    PT Time Calculation (min) 40 min    Activity Tolerance Patient tolerated treatment well;Patient limited by pain    Behavior During Therapy [facility] for tasks assessed/performed                Past Medical History:  Diagnosis Date   Arthritis    Cataract    Chronic kidney disease    Diverticulosis    Dyslipidemia    GERD (gastroesophageal reflux disease)    HTN (hypertension) 2015   Personal history of colonic adenoma 08/09/2008   Vitamin D deficiency    Past Surgical History:  Procedure Laterality Date   ABDOMINAL HYSTERECTOMY  1975   APPENDECTOMY  1975   COLONOSCOPY     HAND SURGERY     Cyst removal   THYROID SURGERY  2000   to remove nodule   TOTAL KNEE ARTHROPLASTY Right 12/23/2022   Procedure: RIGHT TOTAL KNEE ARTHROPLASTY;  Surgeon: Tarry Kos, MD;  Location: [facility] OR;  Service: Orthopedics;  Laterality: Right;   Patient Active Problem List   Diagnosis Date Noted   Status post total right knee replacement 12/23/2022   Primary osteoarthritis of right knee 12/22/2022   History of GI bleed 06/06/2022   Osteopenia 08/30/2021   Aortic atherosclerosis (HCC) 07/12/2021   Diverticulosis 07/12/2021   Leukocytosis 07/05/2021   GAD (generalized anxiety disorder) 07/05/2021   Personal history of calcium pyrophosphate deposition disease (CPPD) 04/11/2020   History of vitamin D deficiency 07/26/2019   Age-related cataract of both eyes 05/19/2018   Hyperlipidemia 05/19/2018   CKD (chronic kidney disease) stage 3, GFR 30-59 ml/min (HCC) 02/14/2018   Essential hypertension 05/12/2017   Transient  cerebral ischemia 05/12/2017   Seasonal allergic rhinitis due to pollen 12/29/2014   GERD (gastroesophageal reflux disease) 01/17/2014   Personal history of colonic adenoma 08/09/2008    PCP:  Ronnald Nian, MD   REFERRING PROVIDER: Tarry Kos, MD   REFERRING DIAG: -10-CM) - Primary osteoarthritis of right knee Z96.651 (ICD-10-CM) - Status post total right knee replacement  THERAPY DIAG:  Acute pain of right knee  Stiffness of right knee, not elsewhere classified  Difficulty in walking, not elsewhere classified  Muscle weakness (generalized)  Localized edema  Rationale for Evaluation and Treatment: Rehabilitation  ONSET DATE: 12/23/22  SUBJECTIVE:   SUBJECTIVE STATEMENT:  Things are feeling stiff still, but its been worse. Nothing new going on. Taking less pain medication now.   PERTINENT HISTORY: Rt TKA on 12/23/22.OA, cataract, CKD, diverticulosis, dyslipidemia, HTN, GERD.  PAIN:  NPRS scale: 3-4/10 pain  Pain location: Rt knee Pain description: tight throb medial R knee  Aggravating factors: being up on knee too much, night time in general   Relieving factors: ice, pain meds, movement   PRECAUTIONS: None  WEIGHT BEARING RESTRICTIONS: No  FALLS:  Has patient fallen in last 6 months? No  LIVING ENVIRONMENT: Lives with: alone Lives in: House/apartment Stairs: Yes: External: 1 steps; none Has following equipment at home: Dan Humphreys - 2 wheeled  OCCUPATION: retired  PLOF: Independent  PATIENT GOALS: Walk without pain  Next MD visit: no follow up scheduled at this time  OBJECTIVE:   DIAGNOSTIC FINDINGS:  IMPRESSION: Postsurgical changes from right knee arthroplasty including subcutaneous and intra-articular air, as well as a joint effusion.  PATIENT SURVEYS:  01/07/23: FOTO intake:  40% , predicted 57%  COGNITION: Overall cognitive status: WFL    SENSATION: WFL  EDEMA:  01/07/23: Circumferential: Rt: 45 centimeters , Left: 42  centimeters   POSTURE: rounded shoulders, forward head, and decreased lumbar lordosis  PALPATION: 01/07/23: Pt with noted pain with palpation around med/lateral joint line of Rt knee, noted tenderness in distal hamstring tendons  LOWER EXTREMITY ROM:   ROM Right 01/07/23 Left 01/07/23 Right 01/10/23 Right  01/15/23 Rt 01/20/23 Rt 01/22/23  Hip flexion        Hip abduction        Hip adduction        Knee flexion 80 110 Passive 98 Supine AA 100*  Sitting P: 100 Supine A: 95 P: 100  Knee extension   -10     Ankle dorsiflexion        Ankle plantarflexion         (Blank rows = not tested)  LOWER EXTREMITY MMT:  MMT Right 01/07/23 Left 01/07/23  Hip flexion 4 5  Hip extension    Hip abduction    Hip adduction    Knee flexion 3 5  Knee extension 3 5  Ankle dorsiflexion    Ankle plantarflexion     (Blank rows = not tested)    FUNCTIONAL TESTS:  01/07/23: 5 time sit to stand: 24 sec c UE support  GAIT: Distance walked: 20 feet  Assistive device utilized: Environmental consultant - 2 wheeled Level of assistance: Modified independence Comments: decreased step length, decreased wt shift to Rt    TODAY'S TREATMENT                                                                          DATE:   01/24/23  TherEx  Nustep with holds for knee flexion stretch at max tolerated distance (Scifit not available) x6 minutes LAQs 2x10 4# Self knee flexion stretch 2x10 with 3 second holds  SAQs 4# x15 with 3 second holds Shuttle LE press 50# DL with 5 second holds with R knee in max tolerated flexion Gait training with SPC, progressing to no device in clinic  547ft total, cues for heel-toe pattern    01/22/2023: TherEx Scifit bike seat 10 partial to  full revolutions x 5 minutes, seat 9 x 3 minutes full revolutions LAQ: 2 x 10 Rt LE 3#  Calf stretch on slant board: x 2 holding 30 seconds Step ups x 15 leading with Rt LE c UE support Leg Press: bil LE 50 # x 20, Rt LE :31# x 12 Seated heel slides  using a pillow case around pt's Rt foot x 20 NMR:  Step tapping on 6 inch step x 20 alternating LE's Standing on Airex: stagger stance x 30 sec bil LE's, feet together x 30 sec Gait  Amb: 600 feet level surface with Straight Cane Manual: PROM: knee flexion/extension with overpressures Modalities;  Vasopneumatic: 34 degrees x 10 minutes,  Rt LE , medium compression    01/20/2023: TherEx Scifit bike seat 9 full revolutions x 8 minutes Calf stretch on slant board: x 2 holding 30 seconds Step ups x 15 leading with Rt LE c UE support Leg Press: bil LE 50 # 2 x 10, Rt LE : 18# x 10, 25# x 10 NMR:  Standing on Airex: feet apart x 30 sec, feet together x 30 sec, staggered stance x 30 sec c each LE  Stepping over TB strip on the floor working on wt shifting forward and back Cone tapping with UE support x 30 alternating Lateral stepping over 6 inch hurdle x 15 each direction c UE support   01/15/2023: TherEx Scifit bike seat 8 partial rotations for 6 minutes then full revolutions 2 min.  Gastroc stretch on incline board 30 sec 2 reps Heel raises 10 reps BUE support light for balance LAQs  & active knee flexion with contralateral LE opposing motion x15 with 3 second holds Hamstring stretch RLE long sit strap DF trunk flexion 30 sec 2 reps Bridges with progressive knee flexion 3x5  Supine knee flexion & active knee ext RLE on red therapy ball with strap with AAROM 10 reps PT recommended doing one exercise of HEP every 30 min during awake hours to limit stiffening.  Work her way thru whole program.   Neuromuscular Re-education Tandem stance on foam with RLE in front & in back 30 sec with intermittent touch Rocker board square with 2 pivots side to side holding level midline 1 min with light BUE support.   Self-Care: PT recommended elevation with RLE higher than heart >/= 2x/day for >/= 15 min. Using ice for 10-15 min at time as often as needed.  Pt verbalized understanding. Weight shifting  onto RLE upon arising for >/= 5 reps prior to walking.  Pt verbalized less pain.  Using pillows for positioning in bed. Pt verbalized understanding.   Manual PROM with overpressure and contract relax.   Vaso right knee 10 minutes Medium 34* with elevation.    01/13/23 TherEx  Scifit bike seat 8 partial rotations for 6 minutes SAQs over foam roll x15 with 3 second holds Supine SLRs x15  Bridges with progressive knee flexion 3x5  Supine knee flexion with AAROM assist from PT Gait training in clinic for heel-toe pattern, RW    Manual  Patella mobilizations all directions to tolerance   Retrograde massage with LEs elevated for edema control    01/10/2023: Recumbent bike Seat 5 for 8 minutes Quadriceps sets with right heel prop 10X 5 seconds Supine straight leg raises (no quad lag) 10X 3 seconds Supine knee flexion (added belt) 10X 10 seconds Discussed seated LAQ and sit to stand (out of time)  Functional Activities: Double Leg Press 50# 15X slow eccentrics, full extension and stretch into flexion  Vaso right knee 10 minutes Medium 34*   PATIENT EDUCATION:  Education details: HEP, POC Person educated: Patient Education method: Programmer, multimedia, Demonstration, Verbal cues, and Handouts Education comprehension: verbalized understanding, returned demonstration, and verbal cues required  HOME EXERCISE PROGRAM: Access Code: [email or web address] URL: [email or web address] Date: 01/07/2023 Prepared by: Narda Amber  Exercises - Supine Quadricep Sets  - 2-3 x daily - 7 x weekly - 15 reps - Supine Active Straight Leg Raise  - 2-3 x daily - 7 x weekly - 15 reps - Supine Heel Slides  - 2-3 x daily - 7 x weekly - 15 reps - Seated Long Arc Quad  - 2-3 x  daily - 7 x weekly - 15 reps - Sit to Stand with Counter Support  - 2-3 x daily - 7 x weekly - 10 reps  ASSESSMENT:  CLINICAL IMPRESSION:  Yentl arrived today feeling a little better today, still very stiff. Warmed up on  Nustep  (Scifit not available) and continued focus on general ROM and quad strength as per POC. Also worked on Investment banker, operational with cane, continued to encourage regular icing routine at home as well. Will continue efforts, she definitely looked like she was feeling and moving better as compared to last time this PT worked with her.   OBJECTIVE IMPAIRMENTS: decreased activity tolerance, decreased balance, decreased mobility, difficulty walking, decreased ROM, decreased strength, increased edema, impaired flexibility, and pain.   ACTIVITY LIMITATIONS: sitting, standing, squatting, sleeping, stairs, and transfers  PARTICIPATION LIMITATIONS: meal prep, cleaning, laundry, and community activity  PERSONAL FACTORS: Age and 3+ comorbidities: see above  are also affecting patient's functional outcome.   REHAB POTENTIAL: Good  CLINICAL DECISION MAKING: Stable/uncomplicated  EVALUATION COMPLEXITY: Low  GOALS: Goals reviewed with patient? Yes  SHORT TERM GOALS: (target date for Short term goals are 3 weeks 01/31/23)   1.  Patient will demonstrate independent use of home exercise program to maintain progress from in clinic treatments.  Goal status: MET 01/22/23  LONG TERM GOALS: (target dates for all long term goals are 12 weeks  04/04/23 )   1. Patient will demonstrate/report pain at worst less than or equal to 2/10 to facilitate minimal limitation in daily activity secondary to pain symptoms.  Goal status:On-going: 01/22/23   2. Patient will demonstrate independent use of home exercise program to facilitate ability to maintain/progress functional gains from skilled physical therapy services.  Goal status: New   3. Patient will demonstrate FOTO outcome > or = 57 % to indicate reduced disability due to condition.  Goal status: New   4.  Patient will demonstrate Rt LE MMT >/= 4/5 throughout to faciltiate usual transfers, stairs, squatting at [facility] for daily life.   Goal status: New   5.  Patient  will demonstrate improved sit to stand to </= 14 seconds with no UE support.   Goal status: New   6.  Pt will be able to amb in community around neighborhood for >/= 20 minutes safely.    Goal status: On-going 01/22/23  7. Pt will be able to navigate up and down on curb step with no device safely.    Goal status: New    PLAN:  PT FREQUENCY: 1-2x/week  PT DURATION: 10 weeks  PLANNED INTERVENTIONS: Therapeutic exercises, Therapeutic activity, Neuro Muscular re-education, Balance training, Gait training, Patient/Family education, Joint mobilization, Stair training, DME instructions, Dry Needling, Electrical stimulation, Traction, Cryotherapy, vasopneumatic deviceMoist heat, Taping, Ultrasound, Ionotophoresis 4mg /ml Dexamethasone, and aquatic therapy, Manual therapy.  All included unless contraindicated  PLAN FOR NEXT SESSION: progress gait with st cane and then no device, Manual knee ROM (extension and flexion), Quad strengthening, vasopneumatic as needed    Nedra Hai PT DPT PN2    01/24/2023, 2:28 PM

## 2023-01-28 ENCOUNTER — Encounter: Payer: Medicare HMO | Admitting: Physical Therapy

## 2023-01-31 ENCOUNTER — Encounter: Payer: Self-pay | Admitting: Rehabilitative and Restorative Service Providers"

## 2023-01-31 ENCOUNTER — Ambulatory Visit: Payer: Medicare HMO | Admitting: Rehabilitative and Restorative Service Providers"

## 2023-01-31 DIAGNOSIS — M25661 Stiffness of right knee, not elsewhere classified: Secondary | ICD-10-CM | POA: Diagnosis not present

## 2023-01-31 DIAGNOSIS — R262 Difficulty in walking, not elsewhere classified: Secondary | ICD-10-CM

## 2023-01-31 DIAGNOSIS — M6281 Muscle weakness (generalized): Secondary | ICD-10-CM

## 2023-01-31 DIAGNOSIS — R600 Localized edema: Secondary | ICD-10-CM | POA: Diagnosis not present

## 2023-01-31 DIAGNOSIS — M25561 Pain in right knee: Secondary | ICD-10-CM | POA: Diagnosis not present

## 2023-01-31 NOTE — Therapy (Signed)
OUTPATIENT PHYSICAL THERAPY LOWER EXTREMITY  TREATMENT  Patient Name: Cristina Sawyer MRN: [id number] DOB:[birth year], 82 y.o., female Today's Date: 01/31/2023  END OF SESSION:  PT End of Session - 01/31/23 1338     Visit Number 8    Number of Visits 24    Date for PT Re-Evaluation 04/04/23    Authorization - Visit Number 8    Progress Note Due on Visit 10    PT Start Time 1338    PT Stop Time 1439    PT Time Calculation (min) 61 min    Activity Tolerance Patient tolerated treatment well;Patient limited by pain    Behavior During Therapy [facility] for tasks assessed/performed              Past Medical History:  Diagnosis Date   Arthritis    Cataract    Chronic kidney disease    Diverticulosis    Dyslipidemia    GERD (gastroesophageal reflux disease)    HTN (hypertension) 2015   Personal history of colonic adenoma 08/09/2008   Vitamin D deficiency    Past Surgical History:  Procedure Laterality Date   ABDOMINAL HYSTERECTOMY  1975   APPENDECTOMY  1975   COLONOSCOPY     HAND SURGERY     Cyst removal   THYROID SURGERY  2000   to remove nodule   TOTAL KNEE ARTHROPLASTY Right 12/23/2022   Procedure: RIGHT TOTAL KNEE ARTHROPLASTY;  Surgeon: Tarry Kos, MD;  Location: [facility] OR;  Service: Orthopedics;  Laterality: Right;   Patient Active Problem List   Diagnosis Date Noted   Status post total right knee replacement 12/23/2022   Primary osteoarthritis of right knee 12/22/2022   History of GI bleed 06/06/2022   Osteopenia 08/30/2021   Aortic atherosclerosis (HCC) 07/12/2021   Diverticulosis 07/12/2021   Leukocytosis 07/05/2021   GAD (generalized anxiety disorder) 07/05/2021   Personal history of calcium pyrophosphate deposition disease (CPPD) 04/11/2020   History of vitamin D deficiency 07/26/2019   Age-related cataract of both eyes 05/19/2018   Hyperlipidemia 05/19/2018   CKD (chronic kidney disease) stage 3, GFR 30-59 ml/min (HCC) 02/14/2018   Essential hypertension  05/12/2017   Transient cerebral ischemia 05/12/2017   Seasonal allergic rhinitis due to pollen 12/29/2014   GERD (gastroesophageal reflux disease) 01/17/2014   Personal history of colonic adenoma 08/09/2008    PCP:  Ronnald Nian, MD   REFERRING PROVIDER: Tarry Kos, MD   REFERRING DIAG: -10-CM) - Primary osteoarthritis of right knee Z96.651 (ICD-10-CM) - Status post total right knee replacement  THERAPY DIAG:  Acute pain of right knee  Stiffness of right knee, not elsewhere classified  Difficulty in walking, not elsewhere classified  Muscle weakness (generalized)  Localized edema  Rationale for Evaluation and Treatment: Rehabilitation  ONSET DATE: 12/23/22  SUBJECTIVE:   SUBJECTIVE STATEMENT:  Cristina Sawyer is getting about 4-5 hours of uninterrupted sleep.  She reports doing well with the cane at home.  PERTINENT HISTORY: Rt TKA on 12/23/22.OA, cataract, CKD, diverticulosis, dyslipidemia, HTN, GERD.  PAIN:  NPRS scale: 3-5/10 pain this week Pain location: Rt knee Pain description: tight throb medial R knee  Aggravating factors: being up on knee too much, night time in general   Relieving factors: ice, pain meds, movement   PRECAUTIONS: None  WEIGHT BEARING RESTRICTIONS: No  FALLS:  Has patient fallen in last 6 months? No  LIVING ENVIRONMENT: Lives with: alone Lives in: House/apartment Stairs: Yes: External: 1 steps; none Has following equipment at home:  Walker - 2 wheeled  OCCUPATION: retired  PLOF: Independent  PATIENT GOALS: Walk without pain  Next MD visit: no follow up scheduled at this time  OBJECTIVE:   DIAGNOSTIC FINDINGS:  IMPRESSION: Postsurgical changes from right knee arthroplasty including subcutaneous and intra-articular air, as well as a joint effusion.  PATIENT SURVEYS:  01/07/23: FOTO intake:  40% , predicted 57%  COGNITION: Overall cognitive status: WFL    SENSATION: WFL  EDEMA:  01/07/23: Circumferential: Rt: 45  centimeters , Left: 42 centimeters   POSTURE: rounded shoulders, forward head, and decreased lumbar lordosis  PALPATION: 01/07/23: Pt with noted pain with palpation around med/lateral joint line of Rt knee, noted tenderness in distal hamstring tendons  LOWER EXTREMITY ROM:   ROM Right 01/07/23 Left 01/07/23 Right 01/10/23 Right  01/15/23 Rt 01/20/23 Rt 01/22/23 Rt 01/31/23  Hip flexion         Hip abduction         Hip adduction         Knee flexion 80 110 Passive 98 Supine AA 100*  Sitting P: 100 Supine A: 95 P: 100 105 active  Knee extension   -10    -4 active  Ankle dorsiflexion         Ankle plantarflexion          (Blank rows = not tested)  LOWER EXTREMITY MMT:  MMT Right 01/07/23 Left 01/07/23  Hip flexion 4 5  Hip extension    Hip abduction    Hip adduction    Knee flexion 3 5  Knee extension 3 5  Ankle dorsiflexion    Ankle plantarflexion     (Blank rows = not tested)    FUNCTIONAL TESTS:  01/07/23: 5 time sit to stand: 24 sec c UE support  GAIT: Distance walked: 20 feet  Assistive device utilized: Environmental consultant - 2 wheeled Level of assistance: Modified independence Comments: decreased step length, decreased wt shift to Rt    TODAY'S TREATMENT                                                                          DATE: 01/31/2023 Recumbent bike Seat 5 for 8 minutes Quadriceps sets with right heel prop 10X 5 seconds Supine straight leg raises (no quad lag) 10X 3 seconds Seated AAROM knee flexion (left pushes right into flexion) 10X 10 seconds Discussed seated LAQ (out of time)  Functional Activities: Step up and down 4 and 6 inch step 15X each slow eccentrics with cane and correct sequencing Double Leg Press 62# 15X slow eccentrics, full extension and stretch into flexion Sit to stand slow eccentrics 5X    01/24/23 TherEx  Nustep with holds for knee flexion stretch at max tolerated distance (Scifit not available) x6 minutes LAQs 2x10 4# Self knee flexion  stretch 2x10 with 3 second holds  SAQs 4# x15 with 3 second holds Shuttle LE press 50# DL with 5 second holds with R knee in max tolerated flexion Gait training with SPC, progressing to no device in clinic  545ft total, cues for heel-toe pattern    01/22/2023: TherEx Scifit bike seat 10 partial to  full revolutions x 5 minutes, seat 9 x 3 minutes full revolutions LAQ: 2  x 10 Rt LE 3#  Calf stretch on slant board: x 2 holding 30 seconds Step ups x 15 leading with Rt LE c UE support Leg Press: bil LE 50 # x 20, Rt LE :31# x 12 Seated heel slides using a pillow case around pt's Rt foot x 20 NMR:  Step tapping on 6 inch step x 20 alternating LE's Standing on Airex: stagger stance x 30 sec bil LE's, feet together x 30 sec Gait  Amb: 600 feet level surface with Straight Cane Manual: PROM: knee flexion/extension with overpressures Modalities;  Vasopneumatic: 34 degrees x 10 minutes, Rt LE , medium compression   PATIENT EDUCATION:  Education details: HEP, POC Person educated: Patient Education method: Programmer, multimedia, Demonstration, Verbal cues, and Handouts Education comprehension: verbalized understanding, returned demonstration, and verbal cues required  HOME EXERCISE PROGRAM: Access Code: [email or web address] URL: [email or web address] Date: 01/07/2023 Prepared by: Narda Amber  Exercises - Supine Quadricep Sets  - 2-3 x daily - 7 x weekly - 15 reps - Supine Active Straight Leg Raise  - 2-3 x daily - 7 x weekly - 15 reps - Supine Heel Slides  - 2-3 x daily - 7 x weekly - 15 reps - Seated Long Arc Quad  - 2-3 x daily - 7 x weekly - 15 reps - Sit to Stand with Counter Support  - 2-3 x daily - 7 x weekly - 10 reps  ASSESSMENT:  CLINICAL IMPRESSION:  AROM was 4 - 0 - 105 degrees today.  Kiko was able to complete full revolutions on the bicycle, did a good job with her early work on functional stairs and reports she still needs her cane with walking inside and outside the home.   Active range of motion, quadricep strength, balance and gait will continue to benefit from the recommended plan of care.  OBJECTIVE IMPAIRMENTS: decreased activity tolerance, decreased balance, decreased mobility, difficulty walking, decreased ROM, decreased strength, increased edema, impaired flexibility, and pain.   ACTIVITY LIMITATIONS: sitting, standing, squatting, sleeping, stairs, and transfers  PARTICIPATION LIMITATIONS: meal prep, cleaning, laundry, and community activity  PERSONAL FACTORS: Age and 3+ comorbidities: see above  are also affecting patient's functional outcome.   REHAB POTENTIAL: Good  CLINICAL DECISION MAKING: Stable/uncomplicated  EVALUATION COMPLEXITY: Low  GOALS: Goals reviewed with patient? Yes  SHORT TERM GOALS: (target date for Short term goals are 3 weeks 01/31/23)   1.  Patient will demonstrate independent use of home exercise program to maintain progress from in clinic treatments.  Goal status: MET 01/22/23  LONG TERM GOALS: (target dates for all long term goals are 12 weeks  04/04/23 )   1. Patient will demonstrate/report pain at worst less than or equal to 2/10 to facilitate minimal limitation in daily activity secondary to pain symptoms.  Goal status:On-going: 01/31/23   2. Patient will demonstrate independent use of home exercise program to facilitate ability to maintain/progress functional gains from skilled physical therapy services.  Goal status: New   3. Patient will demonstrate FOTO outcome > or = 57 % to indicate reduced disability due to condition.  Goal status: New   4.  Patient will demonstrate Rt LE MMT >/= 4/5 throughout to faciltiate usual transfers, stairs, squatting at [facility] for daily life.   Goal status: New   5.  Patient will demonstrate improved sit to stand to </= 14 seconds with no UE support.   Goal status: New   6.  Pt will be able to amb in community around neighborhood for >/=  20 minutes safely.    Goal status:  On-going 01/31/23  7. Pt will be able to navigate up and down on curb step with no device safely.    Goal status: On Going 01/31/2023    PLAN:  PT FREQUENCY: 1-2x/week  PT DURATION: 10 weeks  PLANNED INTERVENTIONS: Therapeutic exercises, Therapeutic activity, Neuro Muscular re-education, Balance training, Gait training, Patient/Family education, Joint mobilization, Stair training, DME instructions, Dry Needling, Electrical stimulation, Traction, Cryotherapy, vasopneumatic deviceMoist heat, Taping, Ultrasound, Ionotophoresis 4mg /ml Dexamethasone, and aquatic therapy, Manual therapy.  All included unless contraindicated  PLAN FOR NEXT SESSION: AROM, quadriceps and general lower extremity strengthening, balance, stairs and gait progressions to get her more comfortable with these activities and wean her off the cane.   Cherlyn Cushing, PT, MPT   01/31/2023, 4:34 PM

## 2023-02-04 ENCOUNTER — Encounter: Payer: Self-pay | Admitting: Physical Therapy

## 2023-02-04 ENCOUNTER — Ambulatory Visit: Payer: Medicare HMO | Admitting: Physical Therapy

## 2023-02-04 DIAGNOSIS — M6281 Muscle weakness (generalized): Secondary | ICD-10-CM

## 2023-02-04 DIAGNOSIS — R262 Difficulty in walking, not elsewhere classified: Secondary | ICD-10-CM | POA: Diagnosis not present

## 2023-02-04 DIAGNOSIS — M25561 Pain in right knee: Secondary | ICD-10-CM | POA: Diagnosis not present

## 2023-02-04 DIAGNOSIS — R600 Localized edema: Secondary | ICD-10-CM | POA: Diagnosis not present

## 2023-02-04 DIAGNOSIS — M25661 Stiffness of right knee, not elsewhere classified: Secondary | ICD-10-CM

## 2023-02-04 NOTE — Therapy (Signed)
OUTPATIENT PHYSICAL THERAPY LOWER EXTREMITY  TREATMENT  Patient Name: Cristina Sawyer MRN: [id number] DOB:Mar 30, 1941, 82 y.o., female Today's Date: 01/31/2023  END OF SESSION:  PT End of Session - 01/31/23 1338     Visit Number 8    Number of Visits 24    Date for PT Re-Evaluation 04/04/23    Authorization - Visit Number 8    Progress Note Due on Visit 10    PT Start Time 1338    PT Stop Time 1439    PT Time Calculation (min) 61 min    Activity Tolerance Patient tolerated treatment well;Patient limited by pain    Behavior During Therapy [facility] for tasks assessed/performed              Past Medical History:  Diagnosis Date   Arthritis    Cataract    Chronic kidney disease    Diverticulosis    Dyslipidemia    GERD (gastroesophageal reflux disease)    HTN (hypertension) 2015   Personal history of colonic adenoma 08/09/2008   Vitamin D deficiency    Past Surgical History:  Procedure Laterality Date   ABDOMINAL HYSTERECTOMY  1975   APPENDECTOMY  1975   COLONOSCOPY     HAND SURGERY     Cyst removal   THYROID SURGERY  2000   to remove nodule   TOTAL KNEE ARTHROPLASTY Right 12/23/2022   Procedure: RIGHT TOTAL KNEE ARTHROPLASTY;  Surgeon: Tarry Kos, MD;  Location: [facility] OR;  Service: Orthopedics;  Laterality: Right;   Patient Active Problem List   Diagnosis Date Noted   Status post total right knee replacement 12/23/2022   Primary osteoarthritis of right knee 12/22/2022   History of GI bleed 06/06/2022   Osteopenia 08/30/2021   Aortic atherosclerosis (HCC) 07/12/2021   Diverticulosis 07/12/2021   Leukocytosis 07/05/2021   GAD (generalized anxiety disorder) 07/05/2021   Personal history of calcium pyrophosphate deposition disease (CPPD) 04/11/2020   History of vitamin D deficiency 07/26/2019   Age-related cataract of both eyes 05/19/2018   Hyperlipidemia 05/19/2018   CKD (chronic kidney disease) stage 3, GFR 30-59 ml/min (HCC) 02/14/2018   Essential hypertension  05/12/2017   Transient cerebral ischemia 05/12/2017   Seasonal allergic rhinitis due to pollen 12/29/2014   GERD (gastroesophageal reflux disease) 01/17/2014   Personal history of colonic adenoma 08/09/2008    PCP:  Ronnald Nian, MD   REFERRING PROVIDER: Tarry Kos, MD   REFERRING DIAG: -10-CM) - Primary osteoarthritis of right knee Z96.651 (ICD-10-CM) - Status post total right knee replacement  THERAPY DIAG:  Acute pain of right knee  Stiffness of right knee, not elsewhere classified  Difficulty in walking, not elsewhere classified  Muscle weakness (generalized)  Localized edema  Rationale for Evaluation and Treatment: Rehabilitation  ONSET DATE: 12/23/22  SUBJECTIVE:   SUBJECTIVE STATEMENT:  Pt arriving today using her st cane for amb. Pt stating she is still taking PRN pain meds. Pt with 2/10 pain upon arrival.   PERTINENT HISTORY: Rt TKA on 12/23/22.OA, cataract, CKD, diverticulosis, dyslipidemia, HTN, GERD.  PAIN:  NPRS scale: 210 pain this week Pain location: Rt knee Pain description: tight throb medial R knee  Aggravating factors: being up on knee too much, night time in general   Relieving factors: ice, pain meds, movement   PRECAUTIONS: None  WEIGHT BEARING RESTRICTIONS: No  FALLS:  Has patient fallen in last 6 months? No  LIVING ENVIRONMENT: Lives with: alone Lives in: House/apartment Stairs: Yes: External: 1 steps;  none Has following equipment at home: Dan Humphreys - 2 wheeled  OCCUPATION: retired  PLOF: Independent  PATIENT GOALS: Walk without pain  Next MD visit: no follow up scheduled at this time  OBJECTIVE:   DIAGNOSTIC FINDINGS:  IMPRESSION: Postsurgical changes from right knee arthroplasty including subcutaneous and intra-articular air, as well as a joint effusion.  PATIENT SURVEYS:  01/07/23: FOTO intake:  40% , predicted 57%  COGNITION: Overall cognitive status: WFL    SENSATION: WFL  EDEMA:  01/07/23: Circumferential:  Rt: 45 centimeters , Left: 42 centimeters   POSTURE: rounded shoulders, forward head, and decreased lumbar lordosis  PALPATION: 01/07/23: Pt with noted pain with palpation around med/lateral joint line of Rt knee, noted tenderness in distal hamstring tendons  LOWER EXTREMITY ROM:   ROM Right 01/07/23 Left 01/07/23 Right 01/10/23 Right  01/15/23 Rt 01/20/23 Rt 01/22/23 Rt 01/31/23 Rt 02/04/23  Hip flexion          Hip abduction          Hip adduction          Knee flexion 80 110 Passive 98 Supine AA 100*  Sitting P: 100 Supine A: 95 P: 100 105 active A: 104 P: 110  Knee extension   -10    -4 active A: -4  P: -2  Ankle dorsiflexion          Ankle plantarflexion           (Blank rows = not tested)  LOWER EXTREMITY MMT:  MMT Right 01/07/23 Left 01/07/23  Hip flexion 4 5  Hip extension    Hip abduction    Hip adduction    Knee flexion 3 5  Knee extension 3 5  Ankle dorsiflexion    Ankle plantarflexion     (Blank rows = not tested)    FUNCTIONAL TESTS:  01/07/23: 5 time sit to stand: 24 sec c UE support  GAIT: Distance walked: 20 feet  Assistive device utilized: Walker - 2 wheeled Level of assistance: Modified independence Comments: decreased step length, decreased wt shift to Rt    TODAY'S TREATMENT                                                                          DATE: 02/04/2023: TherEx Recumbent bike: seat 6 partial to full revolutions x 5 , seat 5 full revolutions x 3 minutes Calf stretch on slant board: x 2 holding 30 seconds Step ups 6 inch  x 15 leading with Rt LE c UE support Leg Press: bil LE 75 # x 20, Rt LE :50# x 12 Forward lunge x 10 holding end range stretch x 10  NMR:  Fitter fit rocker 2 pivots board: both directions x 1 minute each Standing staggered stance on Airex x 1 minute each LE leading Standing feet together on Airex x 30 seconds with close supervision Manual: PROM Rt knee: knee flexion/extension  Seated with IR and flexion of Rt  knee Modalities;  Vasopneumatic: 34 degrees x 10 minutes, Rt LE , medium compression    01/31/2023 Recumbent bike Seat 5 for 8 minutes Quadriceps sets with right heel prop 10X 5 seconds Supine straight leg raises (no quad lag) 10X 3 seconds Seated AAROM  knee flexion (left pushes right into flexion) 10X 10 seconds Discussed seated LAQ (out of time)  Functional Activities: Step up and down 4 and 6 inch step 15X each slow eccentrics with cane and correct sequencing Double Leg Press 62# 15X slow eccentrics, full extension and stretch into flexion Sit to stand slow eccentrics 5X    01/24/23 TherEx  Nustep with holds for knee flexion stretch at max tolerated distance (Scifit not available) x6 minutes LAQs 2x10 4# Self knee flexion stretch 2x10 with 3 second holds  SAQs 4# x15 with 3 second holds Shuttle LE press 50# DL with 5 second holds with R knee in max tolerated flexion Gait training with SPC, progressing to no device in clinic  541ft total, cues for heel-toe pattern    01/22/2023: TherEx Scifit bike seat 10 partial to  full revolutions x 5 minutes, seat 9 x 3 minutes full revolutions LAQ: 2 x 10 Rt LE 3#  Calf stretch on slant board: x 2 holding 30 seconds Step ups x 15 leading with Rt LE c UE support Leg Press: bil LE 50 # x 20, Rt LE :31# x 12 Seated heel slides using a pillow case around pt's Rt foot x 20 NMR:  Step tapping on 6 inch step x 20 alternating LE's Standing on Airex: stagger stance x 30 sec bil LE's, feet together x 30 sec Gait  Amb: 600 feet level surface with Straight Cane Manual: PROM: knee flexion/extension with overpressures Modalities;  Vasopneumatic: 34 degrees x 10 minutes, Rt LE , medium compression   PATIENT EDUCATION:  Education details: HEP, POC Person educated: Patient Education method: Programmer, multimedia, Demonstration, Verbal cues, and Handouts Education comprehension: verbalized understanding, returned demonstration, and verbal cues  required  HOME EXERCISE PROGRAM: Access Code: [email or web address] URL: [email or web address] Date: 01/07/2023 Prepared by: Narda Amber  Exercises - Supine Quadricep Sets  - 2-3 x daily - 7 x weekly - 15 reps - Supine Active Straight Leg Raise  - 2-3 x daily - 7 x weekly - 15 reps - Supine Heel Slides  - 2-3 x daily - 7 x weekly - 15 reps - Seated Long Arc Quad  - 2-3 x daily - 7 x weekly - 15 reps - Sit to Stand with Counter Support  - 2-3 x daily - 7 x weekly - 10 reps  ASSESSMENT:  CLINICAL IMPRESSION: Pt with improvements in her Rt knee passive knee flexion to 110 degrees today. Pt is making progress with her amb using a straight cane. Pt still with noted swelling and pt instructed to continue to ice and elevate when she has to be on her feet more during the day. Continue skilled PT interventions  OBJECTIVE IMPAIRMENTS: decreased activity tolerance, decreased balance, decreased mobility, difficulty walking, decreased ROM, decreased strength, increased edema, impaired flexibility, and pain.   ACTIVITY LIMITATIONS: sitting, standing, squatting, sleeping, stairs, and transfers  PARTICIPATION LIMITATIONS: meal prep, cleaning, laundry, and community activity  PERSONAL FACTORS: Age and 3+ comorbidities: see above  are also affecting patient's functional outcome.   REHAB POTENTIAL: Good  CLINICAL DECISION MAKING: Stable/uncomplicated  EVALUATION COMPLEXITY: Low  GOALS: Goals reviewed with patient? Yes  SHORT TERM GOALS: (target date for Short term goals are 3 weeks 01/31/23)   1.  Patient will demonstrate independent use of home exercise program to maintain progress from in clinic treatments.  Goal status: MET 01/22/23  LONG TERM GOALS: (target dates for all long term goals are 12 weeks  04/04/23 )   1. Patient  will demonstrate/report pain at worst less than or equal to 2/10 to facilitate minimal limitation in daily activity secondary to pain symptoms.  Goal  status:On-going: 01/31/23   2. Patient will demonstrate independent use of home exercise program to facilitate ability to maintain/progress functional gains from skilled physical therapy services.  Goal status: New   3. Patient will demonstrate FOTO outcome > or = 57 % to indicate reduced disability due to condition.  Goal status: New   4.  Patient will demonstrate Rt LE MMT >/= 4/5 throughout to faciltiate usual transfers, stairs, squatting at [facility] for daily life.   Goal status: New   5.  Patient will demonstrate improved sit to stand to </= 14 seconds with no UE support.   Goal status: New   6.  Pt will be able to amb in community around neighborhood for >/= 20 minutes safely.    Goal status: On-going 01/31/23  7. Pt will be able to navigate up and down on curb step with no device safely.    Goal status: On Going 01/31/2023    PLAN:  PT FREQUENCY: 1-2x/week  PT DURATION: 10 weeks  PLANNED INTERVENTIONS: Therapeutic exercises, Therapeutic activity, Neuro Muscular re-education, Balance training, Gait training, Patient/Family education, Joint mobilization, Stair training, DME instructions, Dry Needling, Electrical stimulation, Traction, Cryotherapy, vasopneumatic deviceMoist heat, Taping, Ultrasound, Ionotophoresis 4mg /ml Dexamethasone, and aquatic therapy, Manual therapy.  All included unless contraindicated  PLAN FOR NEXT SESSION: AROM, quadriceps and general lower extremity strengthening, balance, stairs and gait progressions to get her more comfortable with these activities and wean her off the cane.   Narda Amber, PT, MPT 02/04/23 2:25 PM     01/31/2023, 4:34 PM

## 2023-02-06 ENCOUNTER — Encounter: Payer: Self-pay | Admitting: Rehabilitative and Restorative Service Providers"

## 2023-02-06 ENCOUNTER — Ambulatory Visit: Payer: Medicare HMO | Admitting: Rehabilitative and Restorative Service Providers"

## 2023-02-06 DIAGNOSIS — R262 Difficulty in walking, not elsewhere classified: Secondary | ICD-10-CM

## 2023-02-06 DIAGNOSIS — M6281 Muscle weakness (generalized): Secondary | ICD-10-CM | POA: Diagnosis not present

## 2023-02-06 DIAGNOSIS — M25661 Stiffness of right knee, not elsewhere classified: Secondary | ICD-10-CM

## 2023-02-06 DIAGNOSIS — M25561 Pain in right knee: Secondary | ICD-10-CM

## 2023-02-06 DIAGNOSIS — R600 Localized edema: Secondary | ICD-10-CM | POA: Diagnosis not present

## 2023-02-06 NOTE — Therapy (Signed)
OUTPATIENT PHYSICAL THERAPY LOWER EXTREMITY  TREATMENT/PROGRESS NOTE  Patient Name: Cristina Sawyer MRN: [id number] DOB:December 02, 1940, 82 y.o., female Today's Date: 02/06/2023  END OF SESSION:  PT End of Session - 02/06/23 1439     Visit Number 10    Number of Visits 24    Date for PT Re-Evaluation 04/04/23    Authorization - Visit Number 10    Progress Note Due on Visit 20    PT Start Time 1438    PT Stop Time 1530    PT Time Calculation (min) 52 min    Activity Tolerance Patient tolerated treatment well;Patient limited by pain;No increased pain    Behavior During Therapy [facility] for tasks assessed/performed            Progress Note Reporting Period 01/07/2023 to 02/06/2023  See note below for Objective Data and Assessment of Progress/Goals.      Past Medical History:  Diagnosis Date   Arthritis    Cataract    Chronic kidney disease    Diverticulosis    Dyslipidemia    GERD (gastroesophageal reflux disease)    HTN (hypertension) 2015   Personal history of colonic adenoma 08/09/2008   Vitamin D deficiency    Past Surgical History:  Procedure Laterality Date   ABDOMINAL HYSTERECTOMY  1975   APPENDECTOMY  1975   COLONOSCOPY     HAND SURGERY     Cyst removal   THYROID SURGERY  2000   to remove nodule   TOTAL KNEE ARTHROPLASTY Right 12/23/2022   Procedure: RIGHT TOTAL KNEE ARTHROPLASTY;  Surgeon: Tarry Kos, MD;  Location: [facility] OR;  Service: Orthopedics;  Laterality: Right;   Patient Active Problem List   Diagnosis Date Noted   Status post total right knee replacement 12/23/2022   Primary osteoarthritis of right knee 12/22/2022   History of GI bleed 06/06/2022   Osteopenia 08/30/2021   Aortic atherosclerosis (HCC) 07/12/2021   Diverticulosis 07/12/2021   Leukocytosis 07/05/2021   GAD (generalized anxiety disorder) 07/05/2021   Personal history of calcium pyrophosphate deposition disease (CPPD) 04/11/2020   History of vitamin D deficiency 07/26/2019    Age-related cataract of both eyes 05/19/2018   Hyperlipidemia 05/19/2018   CKD (chronic kidney disease) stage 3, GFR 30-59 ml/min (HCC) 02/14/2018   Essential hypertension 05/12/2017   Transient cerebral ischemia 05/12/2017   Seasonal allergic rhinitis due to pollen 12/29/2014   GERD (gastroesophageal reflux disease) 01/17/2014   Personal history of colonic adenoma 08/09/2008    PCP:  Ronnald Nian, MD   REFERRING PROVIDER: Tarry Kos, MD   REFERRING DIAG: -10-CM) - Primary osteoarthritis of right knee Z96.651 (ICD-10-CM) - Status post total right knee replacement  THERAPY DIAG:  Acute pain of right knee  Stiffness of right knee, not elsewhere classified  Difficulty in walking, not elsewhere classified  Muscle weakness (generalized)  Localized edema  Rationale for Evaluation and Treatment: Rehabilitation  ONSET DATE: 12/23/22  SUBJECTIVE:   SUBJECTIVE STATEMENT: Cristina Sawyer notes significant progress with her right knee AROM, strength, pain and function since starting PT.  Her left knee has been a bit more sore and we have encouraged Cristina Sawyer to do her strengthening activities bilaterally to avoid left sided overuse.  PERTINENT HISTORY: Rt TKA on 12/23/22.OA, cataract, CKD, diverticulosis, dyslipidemia, HTN, GERD.  PAIN:  NPRS scale: 2-5/10 pain this week Pain location: Rt > Lt knee Pain description: tight throb medial R knee  Aggravating factors: being up on knee too much, night time in general  Relieving factors: ice, pain meds, movement   PRECAUTIONS: None  WEIGHT BEARING RESTRICTIONS: No  FALLS:  Has patient fallen in last 6 months? No  LIVING ENVIRONMENT: Lives with: alone Lives in: House/apartment Stairs: Yes: External: 1 steps; none Has following equipment at home: Walker - 2 wheeled  OCCUPATION: retired  PLOF: Independent  PATIENT GOALS: Walk without pain  Next MD visit: no follow up scheduled at this time  OBJECTIVE:   DIAGNOSTIC FINDINGS:   IMPRESSION: Postsurgical changes from right knee arthroplasty including subcutaneous and intra-articular air, as well as a joint effusion.  PATIENT SURVEYS:  02/06/2023: FOTO 57 (Goal met)  01/07/23: FOTO intake:  40% , predicted 57%  COGNITION: Overall cognitive status: WFL    SENSATION: WFL  EDEMA:  01/07/23: Circumferential: Rt: 45 centimeters , Left: 42 centimeters   POSTURE: rounded shoulders, forward head, and decreased lumbar lordosis  PALPATION: 01/07/23: Pt with noted pain with palpation around med/lateral joint line of Rt knee, noted tenderness in distal hamstring tendons  LOWER EXTREMITY ROM:   ROM Right 01/07/23 Left 01/07/23 Right 01/10/23 Right  01/15/23 Rt 01/20/23 Rt 01/22/23 Rt 01/31/23 Rt 02/04/23 Right 02/06/2023  Hip flexion           Hip abduction           Hip adduction           Knee flexion 80 110 Passive 98 Supine AA 100*  Sitting P: 100 Supine A: 95 P: 100 105 active A: 104 P: 110 Active 104   Knee extension   -10    -4 active A: -4  P: -2 Active -2  Ankle dorsiflexion           Ankle plantarflexion            (Blank rows = not tested)  LOWER EXTREMITY MMT:  MMT Right 01/07/23 Left 01/07/23 Left/Right 02/06/2023 in pounds  Hip flexion 4 5   Hip extension     Hip abduction     Hip adduction     Knee flexion 3 5   Knee extension 3 5 39.0/36.8  Ankle dorsiflexion     Ankle plantarflexion      (Blank rows = not tested)    FUNCTIONAL TESTS:  01/07/23: 5 time sit to stand: 24 sec c UE support  GAIT: Distance walked: 20 feet  Assistive device utilized: Environmental consultant - 2 wheeled Level of assistance: Modified independence Comments: decreased step length, decreased wt shift to Rt    TODAY'S TREATMENT                                                                          DATE: 02/06/2023 Recumbent bike Seat 6 for 5 minutes AAROM to AROM [place] knee flexion 1 minute Quadriceps sets with right heel prop 10X 5 seconds Supine straight leg raises  (no quad lag) 10X 3 seconds Seated AAROM knee flexion (left pushes right into flexion) 10X 10 seconds  Functional Activities: Double Leg Press 75# 15X slow eccentrics, full extension and stretch into flexion Single Leg Press 37# 10X slow eccentrics, full extension and stretch into flexion  Vaso Bil knees 10 minutes Medium 34*   02/04/2023: TherEx Recumbent bike: seat 6 partial to  full revolutions x 5 , seat 5 full revolutions x 3 minutes Calf stretch on slant board: x 2 holding 30 seconds Step ups 6 inch  x 15 leading with Rt LE c UE support Leg Press: bil LE 75 # x 20, Rt LE :50# x 12 Forward lunge x 10 holding end range stretch x 10  NMR:  Fitter fit rocker 2 pivots board: both directions x 1 minute each Standing staggered stance on Airex x 1 minute each LE leading Standing feet together on Airex x 30 seconds with close supervision Manual: PROM Rt knee: knee flexion/extension  Seated with IR and flexion of Rt knee Modalities;  Vasopneumatic: 34 degrees x 10 minutes, Rt LE , medium compression   01/31/2023 Recumbent bike Seat 5 for 8 minutes Quadriceps sets with right heel prop 10X 5 seconds Supine straight leg raises (no quad lag) 10X 3 seconds Seated AAROM knee flexion (left pushes right into flexion) 10X 10 seconds Discussed seated LAQ (out of time)  Functional Activities: Step up and down 4 and 6 inch step 15X each slow eccentrics with cane and correct sequencing Double Leg Press 62# 15X slow eccentrics, full extension and stretch into flexion Sit to stand slow eccentrics 5X    PATIENT EDUCATION:  Education details: HEP, POC Person educated: Patient Education method: Programmer, multimedia, Demonstration, Verbal cues, and Handouts Education comprehension: verbalized understanding, returned demonstration, and verbal cues required  HOME EXERCISE PROGRAM: Access Code: [email or web address] URL: [email or web address] Date: 01/07/2023 Prepared by: Narda Amber  Exercises - Supine Quadricep Sets  - 2-3 x daily - 7 x weekly - 15 reps - Supine Active Straight Leg Raise  - 2-3 x daily - 7 x weekly - 15 reps - Supine Heel Slides  - 2-3 x daily - 7 x weekly - 15 reps - Seated Long Arc Quad  - 2-3 x daily - 7 x weekly - 15 reps - Sit to Stand with Counter Support  - 2-3 x daily - 7 x weekly - 10 reps  ASSESSMENT:  CLINICAL IMPRESSION: AROM is 2 - 0 - 104 degrees.  Edema is a bit more today due to a lot of standing and walking 2 days ago.  Derek should meet all long-term goals quicker than anticipated at evaluation.  Additional work on stairs, gait and other remaining impairments will likely be completed in June (current authorization through July 19).  OBJECTIVE IMPAIRMENTS: decreased activity tolerance, decreased balance, decreased mobility, difficulty walking, decreased ROM, decreased strength, increased edema, impaired flexibility, and pain.   ACTIVITY LIMITATIONS: sitting, standing, squatting, sleeping, stairs, and transfers  PARTICIPATION LIMITATIONS: meal prep, cleaning, laundry, and community activity  PERSONAL FACTORS: Age and 3+ comorbidities: see above  are also affecting patient's functional outcome.   REHAB POTENTIAL: Good  CLINICAL DECISION MAKING: Stable/uncomplicated  EVALUATION COMPLEXITY: Low  GOALS: Goals reviewed with patient? Yes  SHORT TERM GOALS: (target date for Short term goals are 3 weeks 01/31/23)   1.  Patient will demonstrate independent use of home exercise program to maintain progress from in clinic treatments.  Goal status: MET 01/22/23  LONG TERM GOALS: (target dates for all long term goals are 12 weeks  04/04/23 )   1. Patient will demonstrate/report pain at worst less than or equal to 2/10 to facilitate minimal limitation in daily activity secondary to pain symptoms.  Goal status:On-going: 02/06/23   2. Patient will demonstrate independent use of home exercise program to facilitate ability to  maintain/progress functional gains from skilled physical therapy  services.  Goal status: On Going 02/06/2023   3. Patient will demonstrate FOTO outcome > or = 57 % to indicate reduced disability due to condition.  Goal status: Met 02/06/2023   4.  Patient will demonstrate Rt LE MMT >/= 4/5 throughout to faciltiate usual transfers, stairs, squatting at [facility] for daily life.   Goal status: New   5.  Patient will demonstrate improved sit to stand to </= 14 seconds with no UE support.   Goal status: On Going 02/06/2023   6.  Pt will be able to amb in community around neighborhood for >/= 20 minutes safely.    Goal status: On-going 02/06/23  7. Pt will be able to navigate up and down on curb step with no device safely.    Goal status: On Going 02/06/2023    PLAN:  PT FREQUENCY: 1-2x/week  PT DURATION: 4-6 weeks  PLANNED INTERVENTIONS: Therapeutic exercises, Therapeutic activity, Neuro Muscular re-education, Balance training, Gait training, Patient/Family education, Joint mobilization, Stair training, DME instructions, Dry Needling, Electrical stimulation, Traction, Cryotherapy, vasopneumatic deviceMoist heat, Taping, Ultrasound, Ionotophoresis 4mg /ml Dexamethasone, and aquatic therapy, Manual therapy.  All included unless contraindicated  PLAN FOR NEXT SESSION: AROM, quadriceps and general lower extremity strengthening, balance, stairs and gait progressions to get her more comfortable with these activities and prepare for independent rehabilitation.   Cherlyn Cushing, PT, MPT 02/06/23 3:29 PM     02/06/2023, 3:29 PM

## 2023-02-07 ENCOUNTER — Telehealth: Payer: Self-pay | Admitting: Orthopaedic Surgery

## 2023-02-07 MED ORDER — OXYCODONE-ACETAMINOPHEN 5-325 MG PO TABS: 1.0000 | ORAL_TABLET | Freq: Three times a day (TID) | ORAL | 0 refills | Status: DC | PRN

## 2023-02-07 NOTE — Telephone Encounter (Signed)
Pt called requesting a refill of oxycodone. Please send to pharmacy on file. Pt phone number is [phone]

## 2023-02-07 NOTE — Telephone Encounter (Signed)
done

## 2023-02-10 NOTE — Progress Notes (Unsigned)
   Post-Op Visit Note   Patient: Cristina Sawyer           Date of Birth: 03-16-1941           MRN: [id number] Visit Date: 02/11/2023 PCP: Ronnald Nian, MD   Assessment & Plan:  Chief Complaint: No chief complaint on file.  Visit Diagnoses:  1. Status post total right knee replacement     Plan: ***  Follow-Up Instructions: No follow-ups on file.   Orders:  No orders of the defined types were placed in this encounter.  No orders of the defined types were placed in this encounter.   Imaging: No results found.  PMFS History: Patient Active Problem List   Diagnosis Date Noted   Status post total right knee replacement 12/23/2022   Primary osteoarthritis of right knee 12/22/2022   History of GI bleed 06/06/2022   Osteopenia 08/30/2021   Aortic atherosclerosis (HCC) 07/12/2021   Diverticulosis 07/12/2021   Leukocytosis 07/05/2021   GAD (generalized anxiety disorder) 07/05/2021   Personal history of calcium pyrophosphate deposition disease (CPPD) 04/11/2020   History of vitamin D deficiency 07/26/2019   Age-related cataract of both eyes 05/19/2018   Hyperlipidemia 05/19/2018   CKD (chronic kidney disease) stage 3, GFR 30-59 ml/min (HCC) 02/14/2018   Essential hypertension 05/12/2017   Transient cerebral ischemia 05/12/2017   Seasonal allergic rhinitis due to pollen 12/29/2014   GERD (gastroesophageal reflux disease) 01/17/2014   Personal history of colonic adenoma 08/09/2008   Past Medical History:  Diagnosis Date   Arthritis    Cataract    Chronic kidney disease    Diverticulosis    Dyslipidemia    GERD (gastroesophageal reflux disease)    HTN (hypertension) 2015   Personal history of colonic adenoma 08/09/2008   Vitamin D deficiency     Family History  Problem Relation Age of Onset   Heart disease Sister    Heart disease Sister        in her 99s   Colon cancer Neg Hx     Past Surgical History:  Procedure Laterality Date   ABDOMINAL HYSTERECTOMY   1975   APPENDECTOMY  1975   COLONOSCOPY     HAND SURGERY     Cyst removal   THYROID SURGERY  2000   to remove nodule   TOTAL KNEE ARTHROPLASTY Right 12/23/2022   Procedure: RIGHT TOTAL KNEE ARTHROPLASTY;  Surgeon: Tarry Kos, MD;  Location: [facility] OR;  Service: Orthopedics;  Laterality: Right;   Social History   Occupational History   Occupation: Retired from [employer]  Tobacco Use   Smoking status: Never   Smokeless tobacco: Never  Vaping Use   Vaping Use: Never used  Substance and Sexual Activity   Alcohol use: Yes    Alcohol/week: 1.0 standard drink of alcohol    Types: 1 Glasses of wine per week    Comment: socially   Drug use: No   Sexual activity: Yes

## 2023-02-11 ENCOUNTER — Ambulatory Visit (INDEPENDENT_AMBULATORY_CARE_PROVIDER_SITE_OTHER): Payer: Medicare HMO | Admitting: Physician Assistant

## 2023-02-11 ENCOUNTER — Other Ambulatory Visit (INDEPENDENT_AMBULATORY_CARE_PROVIDER_SITE_OTHER): Payer: Medicare HMO

## 2023-02-11 ENCOUNTER — Encounter: Payer: Self-pay | Admitting: Physician Assistant

## 2023-02-11 ENCOUNTER — Ambulatory Visit: Payer: Medicare HMO | Admitting: Physical Therapy

## 2023-02-11 DIAGNOSIS — M6281 Muscle weakness (generalized): Secondary | ICD-10-CM | POA: Diagnosis not present

## 2023-02-11 DIAGNOSIS — Z96651 Presence of right artificial knee joint: Secondary | ICD-10-CM

## 2023-02-11 DIAGNOSIS — M25661 Stiffness of right knee, not elsewhere classified: Secondary | ICD-10-CM

## 2023-02-11 DIAGNOSIS — R600 Localized edema: Secondary | ICD-10-CM | POA: Diagnosis not present

## 2023-02-11 DIAGNOSIS — M25561 Pain in right knee: Secondary | ICD-10-CM

## 2023-02-11 DIAGNOSIS — R262 Difficulty in walking, not elsewhere classified: Secondary | ICD-10-CM | POA: Diagnosis not present

## 2023-02-11 NOTE — Therapy (Signed)
OUTPATIENT PHYSICAL THERAPY LOWER EXTREMITY  TREATMENT/PROGRESS NOTE  Patient Name: Cristina Sawyer MRN: [id number] DOB:[birth year], 82 y.o., female Today's Date: 02/11/2023  END OF SESSION:  PT End of Session - 02/11/23 1352     Visit Number 11    Number of Visits 24    Date for PT Re-Evaluation 04/04/23    Progress Note Due on Visit 20    PT Start Time 1346    PT Stop Time 1434    PT Time Calculation (min) 48 min    Activity Tolerance Patient tolerated treatment well;Patient limited by pain;No increased pain    Behavior During Therapy [facility] for tasks assessed/performed            Progress Note Reporting Period 01/07/2023 to 02/06/2023  See note below for Objective Data and Assessment of Progress/Goals.      Past Medical History:  Diagnosis Date   Arthritis    Cataract    Chronic kidney disease    Diverticulosis    Dyslipidemia    GERD (gastroesophageal reflux disease)    HTN (hypertension) 2015   Personal history of colonic adenoma 08/09/2008   Vitamin D deficiency    Past Surgical History:  Procedure Laterality Date   ABDOMINAL HYSTERECTOMY  1975   APPENDECTOMY  1975   COLONOSCOPY     HAND SURGERY     Cyst removal   THYROID SURGERY  2000   to remove nodule   TOTAL KNEE ARTHROPLASTY Right 12/23/2022   Procedure: RIGHT TOTAL KNEE ARTHROPLASTY;  Surgeon: Tarry Kos, MD;  Location: [facility] OR;  Service: Orthopedics;  Laterality: Right;   Patient Active Problem List   Diagnosis Date Noted   Status post total right knee replacement 12/23/2022   Primary osteoarthritis of right knee 12/22/2022   History of GI bleed 06/06/2022   Osteopenia 08/30/2021   Aortic atherosclerosis (HCC) 07/12/2021   Diverticulosis 07/12/2021   Leukocytosis 07/05/2021   GAD (generalized anxiety disorder) 07/05/2021   Personal history of calcium pyrophosphate deposition disease (CPPD) 04/11/2020   History of vitamin D deficiency 07/26/2019   Age-related cataract of both eyes 05/19/2018    Hyperlipidemia 05/19/2018   CKD (chronic kidney disease) stage 3, GFR 30-59 ml/min (HCC) 02/14/2018   Essential hypertension 05/12/2017   Transient cerebral ischemia 05/12/2017   Seasonal allergic rhinitis due to pollen 12/29/2014   GERD (gastroesophageal reflux disease) 01/17/2014   Personal history of colonic adenoma 08/09/2008    PCP:  Ronnald Nian, MD   REFERRING PROVIDER: Tarry Kos, MD   REFERRING DIAG: -10-CM) - Primary osteoarthritis of right knee Z96.651 (ICD-10-CM) - Status post total right knee replacement  THERAPY DIAG:  Acute pain of right knee  Stiffness of right knee, not elsewhere classified  Difficulty in walking, not elsewhere classified  Localized edema  Muscle weakness (generalized)  Rationale for Evaluation and Treatment: Rehabilitation  ONSET DATE: 12/23/22  SUBJECTIVE:   SUBJECTIVE STATEMENT: Pt arriving today reporting she is walking more without her straight cane. Pt stating she is able to perform most ADL's without difficulty with mild pain noted.   PERTINENT HISTORY: Rt TKA on 12/23/22.OA, cataract, CKD, diverticulosis, dyslipidemia, HTN, GERD.  PAIN:  NPRS scale: 3/10 pain this week Pain location: Rt > Lt knee Pain description: tight throb medial R knee  Aggravating factors: being up on knee too much, night time in general   Relieving factors: ice, pain meds, movement   PRECAUTIONS: None  WEIGHT BEARING RESTRICTIONS: No  FALLS:  Has patient  fallen in last 6 months? No  LIVING ENVIRONMENT: Lives with: alone Lives in: House/apartment Stairs: Yes: External: 1 steps; none Has following equipment at home: Walker - 2 wheeled  OCCUPATION: retired  PLOF: Independent  PATIENT GOALS: Walk without pain  Next MD visit: no follow up scheduled at this time  OBJECTIVE:   DIAGNOSTIC FINDINGS:  IMPRESSION: Postsurgical changes from right knee arthroplasty including subcutaneous and intra-articular air, as well as a joint  effusion.  PATIENT SURVEYS:  02/06/2023: FOTO 57 (Goal met)  01/07/23: FOTO intake:  40% , predicted 57%  COGNITION: Overall cognitive status: WFL    SENSATION: WFL  EDEMA:  01/07/23: Circumferential: Rt: 45 centimeters , Left: 42 centimeters   POSTURE: rounded shoulders, forward head, and decreased lumbar lordosis  PALPATION: 01/07/23: Pt with noted pain with palpation around med/lateral joint line of Rt knee, noted tenderness in distal hamstring tendons  LOWER EXTREMITY ROM:   ROM Right 01/07/23 Left 01/07/23 Right 01/10/23 Right  01/15/23 Rt 01/20/23 Rt 01/22/23 Rt 01/31/23 Rt 02/04/23 Right 02/06/2023 Rt 02/11/23  Hip flexion            Hip abduction            Hip adduction            Knee flexion 80 110 Passive 98 Supine AA 100*  Sitting P: 100 Supine A: 95 P: 100 105 active A: 104 P: 110 Active 104  A:105  Knee extension   -10    -4 active A: -4  P: -2 Active -2 A: -2  Ankle dorsiflexion            Ankle plantarflexion             (Blank rows = not tested)  LOWER EXTREMITY MMT:  MMT Right 01/07/23 Left 01/07/23 Left/Right 02/06/2023 in pounds Rt 02/11/23  Hip flexion 4 5    Hip extension      Hip abduction      Hip adduction      Knee flexion 3 5    Knee extension 3 5 39.0/36.8 4   Ankle dorsiflexion      Ankle plantarflexion       (Blank rows = not tested)    FUNCTIONAL TESTS:  01/07/23: 5 time sit to stand: 24 sec c UE support  GAIT: Distance walked: 20 feet  Assistive device utilized: Walker - 2 wheeled Level of assistance: Modified independence Comments: decreased step length, decreased wt shift to Rt    TODAY'S TREATMENT                                                                          DATE: 02/11/2023: TherEx Nustep: level 6 x 8 minutes Step ups 6 inch  x 15 leading with Rt LE c UE support Lateral step up x 10 leading with each LE (straddling the 6 inch step) Curb step no UE support c close supervision Leg curl machine: Rt LE only 15# 2  x 10 Leg extension machine: bil LE's lifting, Rt lowering, 10# 2 x 10  NMR:  Rocker  board: both directions x 1 minute each Side stepping around parallel bars 2 laps each direction Manual: Seated PROM Rt knee: knee  flexion/extension  Modalities;  Vasopneumatic: 34 degrees x 10 minutes, Rt LE , medium compression   02/06/2023 Recumbent bike Seat 6 for 5 minutes AAROM to AROM Tailgate knee flexion 1 minute Quadriceps sets with right heel prop 10X 5 seconds Supine straight leg raises (no quad lag) 10X 3 seconds Seated AAROM knee flexion (left pushes right into flexion) 10X 10 seconds  Functional Activities: Double Leg Press 75# 15X slow eccentrics, full extension and stretch into flexion Single Leg Press 37# 10X slow eccentrics, full extension and stretch into flexion  Vaso Bil knees 10 minutes Medium 34*     02/04/2023: TherEx Recumbent bike: seat 6 partial to full revolutions x 5 , seat 5 full revolutions x 3 minutes Calf stretch on slant board: x 2 holding 30 seconds Step ups 6 inch  x 15 leading with Rt LE c UE support Leg Press: bil LE 75 # x 20, Rt LE :50# x 12 Forward lunge x 10 holding end range stretch x 10  NMR:  Fitter fit rocker 2 pivots board: both directions x 1 minute each Standing staggered stance on Airex x 1 minute each LE leading Standing feet together on Airex x 30 seconds with close supervision Manual: PROM Rt knee: knee flexion/extension  Seated with IR and flexion of Rt knee Modalities;  Vasopneumatic: 34 degrees x 10 minutes, Rt LE , medium compression      PATIENT EDUCATION:  Education details: HEP, POC Person educated: Patient Education method: Programmer, multimedia, Demonstration, Verbal cues, and Handouts Education comprehension: verbalized understanding, returned demonstration, and verbal cues required  HOME EXERCISE PROGRAM: Access Code: [email or web address] URL: [email or web address] Date: 01/07/2023 Prepared by: Narda Amber  Exercises - Supine Quadricep Sets  - 2-3 x daily - 7 x weekly - 15 reps - Supine Active Straight Leg Raise  - 2-3 x daily - 7 x weekly - 15 reps - Supine Heel Slides  - 2-3 x daily - 7 x weekly - 15 reps - Seated Long Arc Quad  - 2-3 x daily - 7 x weekly - 15 reps - Sit to Stand with Counter Support  - 2-3 x daily - 7 x weekly - 10 reps  ASSESSMENT  CLINICAL IMPRESSION: Pt's Active ROM is 2 to 105 degrees today. Pt is following up with Dr. Roda Shutters following therapy today. Pt is currently amb with straight cane on community surfaces. Pt's MMT improved to 4/5 on her right LE. Pt is making progress with building muscular endurance for community based amb and functional mobility. Continue skilled PT progress toward pt's LTG's.   OBJECTIVE IMPAIRMENTS: decreased activity tolerance, decreased balance, decreased mobility, difficulty walking, decreased ROM, decreased strength, increased edema, impaired flexibility, and pain.   ACTIVITY LIMITATIONS: sitting, standing, squatting, sleeping, stairs, and transfers  PARTICIPATION LIMITATIONS: meal prep, cleaning, laundry, and community activity  PERSONAL FACTORS: Age and 3+ comorbidities: see above  are also affecting patient's functional outcome.   REHAB POTENTIAL: Good  CLINICAL DECISION MAKING: Stable/uncomplicated  EVALUATION COMPLEXITY: Low  GOALS: Goals reviewed with patient? Yes  SHORT TERM GOALS: (target date for Short term goals are 3 weeks 01/31/23)   1.  Patient will demonstrate independent use of home exercise program to maintain progress from in clinic treatments.  Goal status: MET 01/22/23  LONG TERM GOALS: (target dates for all long term goals are 12 weeks  04/04/23 )   1. Patient will demonstrate/report pain at worst less than or equal to 2/10 to facilitate minimal limitation in daily activity secondary to  pain symptoms.  Goal status:On-going: 02/06/23   2. Patient will demonstrate independent use of home exercise program  to facilitate ability to maintain/progress functional gains from skilled physical therapy services.  Goal status: On Going 02/11/2023   3. Patient will demonstrate FOTO outcome > or = 57 % to indicate reduced disability due to condition.  Goal status: Met 02/06/2023   4.  Patient will demonstrate Rt LE MMT >/= 4/5 throughout to faciltiate usual transfers, stairs, squatting at [facility] for daily life.   Goal status:on-going 02/11/23   5.  Patient will demonstrate improved sit to stand to </= 14 seconds with no UE support.   Goal status: On Going 02/11/2023   6.  Pt will be able to amb in community around neighborhood for >/= 20 minutes safely.    Goal status: Partially Met  02/11/23  7. Pt will be able to navigate up and down on curb step with no device safely.    Goal status: MET 02/11/23    PLAN:  PT FREQUENCY: 1-2x/week  PT DURATION: 4-6 weeks  PLANNED INTERVENTIONS: Therapeutic exercises, Therapeutic activity, Neuro Muscular re-education, Balance training, Gait training, Patient/Family education, Joint mobilization, Stair training, DME instructions, Dry Needling, Electrical stimulation, Traction, Cryotherapy, vasopneumatic deviceMoist heat, Taping, Ultrasound, Ionotophoresis 4mg /ml Dexamethasone, and aquatic therapy, Manual therapy.  All included unless contraindicated  PLAN FOR NEXT SESSION: AROM, quadriceps and general lower extremity strengthening, balance, stairs and gait progressions to get her more comfortable with these activities and prepare for independent rehabilitation.   Narda Amber, PT, MPT 02/11/23 2:36 PM       02/11/2023, 2:36 PM

## 2023-02-13 ENCOUNTER — Ambulatory Visit: Payer: Medicare HMO | Admitting: Physical Therapy

## 2023-02-13 ENCOUNTER — Encounter: Payer: Self-pay | Admitting: Physical Therapy

## 2023-02-13 DIAGNOSIS — M25661 Stiffness of right knee, not elsewhere classified: Secondary | ICD-10-CM | POA: Diagnosis not present

## 2023-02-13 DIAGNOSIS — M25561 Pain in right knee: Secondary | ICD-10-CM | POA: Diagnosis not present

## 2023-02-13 DIAGNOSIS — M6281 Muscle weakness (generalized): Secondary | ICD-10-CM

## 2023-02-13 DIAGNOSIS — R262 Difficulty in walking, not elsewhere classified: Secondary | ICD-10-CM

## 2023-02-13 DIAGNOSIS — R600 Localized edema: Secondary | ICD-10-CM

## 2023-02-13 NOTE — Therapy (Signed)
OUTPATIENT PHYSICAL THERAPY LOWER EXTREMITY  TREATMENT Patient Name: Cristina Sawyer MRN: [id number] DOB:10/15/1940, 82 y.o., female Today's Date: 02/13/2023  END OF SESSION:  PT End of Session - 02/13/23 1444     Visit Number 12    Number of Visits 24    Date for PT Re-Evaluation 04/04/23    Progress Note Due on Visit 20    PT Start Time 1433    PT Stop Time 1512    PT Time Calculation (min) 39 min    Activity Tolerance Patient tolerated treatment well    Behavior During Therapy [facility] for tasks assessed/performed                 Past Medical History:  Diagnosis Date   Arthritis    Cataract    Chronic kidney disease    Diverticulosis    Dyslipidemia    GERD (gastroesophageal reflux disease)    HTN (hypertension) 2015   Personal history of colonic adenoma 08/09/2008   Vitamin D deficiency    Past Surgical History:  Procedure Laterality Date   ABDOMINAL HYSTERECTOMY  1975   APPENDECTOMY  1975   COLONOSCOPY     HAND SURGERY     Cyst removal   THYROID SURGERY  2000   to remove nodule   TOTAL KNEE ARTHROPLASTY Right 12/23/2022   Procedure: RIGHT TOTAL KNEE ARTHROPLASTY;  Surgeon: Tarry Kos, MD;  Location: [facility] OR;  Service: Orthopedics;  Laterality: Right;   Patient Active Problem List   Diagnosis Date Noted   Status post total right knee replacement 12/23/2022   Primary osteoarthritis of right knee 12/22/2022   History of GI bleed 06/06/2022   Osteopenia 08/30/2021   Aortic atherosclerosis (HCC) 07/12/2021   Diverticulosis 07/12/2021   Leukocytosis 07/05/2021   GAD (generalized anxiety disorder) 07/05/2021   Personal history of calcium pyrophosphate deposition disease (CPPD) 04/11/2020   History of vitamin D deficiency 07/26/2019   Age-related cataract of both eyes 05/19/2018   Hyperlipidemia 05/19/2018   CKD (chronic kidney disease) stage 3, GFR 30-59 ml/min (HCC) 02/14/2018   Essential hypertension 05/12/2017   Transient cerebral ischemia 05/12/2017    Seasonal allergic rhinitis due to pollen 12/29/2014   GERD (gastroesophageal reflux disease) 01/17/2014   Personal history of colonic adenoma 08/09/2008    PCP:  Ronnald Nian, MD   REFERRING PROVIDER: Tarry Kos, MD   REFERRING DIAG: -10-CM) - Primary osteoarthritis of right knee Z96.651 (ICD-10-CM) - Status post total right knee replacement  THERAPY DIAG:  Acute pain of right knee  Stiffness of right knee, not elsewhere classified  Difficulty in walking, not elsewhere classified  Localized edema  Muscle weakness (generalized)  Rationale for Evaluation and Treatment: Rehabilitation  ONSET DATE: 12/23/22  SUBJECTIVE:   SUBJECTIVE STATEMENT:  Saw Lindsey the other day, she is happy with my progress, wants me to continue with PT. Still having a lot of aching at night, still need oxy before bed so this is getting better too. Still have to take time on steps, otherwise able to do most of my chores around the house. Still not driving.   PERTINENT HISTORY: Rt TKA on 12/23/22.OA, cataract, CKD, diverticulosis, dyslipidemia, HTN, GERD.  PAIN:  NPRS scale: 4/10 pain this week Pain location: R knee, BLEs in general  Pain description: soreness  Aggravating factors: being on knee too much, doing too much at one time  Relieving factors: ice, pain meds, movement   PRECAUTIONS: None  WEIGHT BEARING RESTRICTIONS: No  FALLS:  Has patient fallen in last 6 months? No  LIVING ENVIRONMENT: Lives with: alone Lives in: House/apartment Stairs: Yes: External: 1 steps; none Has following equipment at home: Walker - 2 wheeled  OCCUPATION: retired  PLOF: Independent  PATIENT GOALS: Walk without pain  Next MD visit: no follow up scheduled at this time  OBJECTIVE:   DIAGNOSTIC FINDINGS:  IMPRESSION: Postsurgical changes from right knee arthroplasty including subcutaneous and intra-articular air, as well as a joint effusion.  PATIENT SURVEYS:  02/06/2023: FOTO 57 (Goal  met)  01/07/23: FOTO intake:  40% , predicted 57%  COGNITION: Overall cognitive status: WFL    SENSATION: WFL  EDEMA:  01/07/23: Circumferential: Rt: 45 centimeters , Left: 42 centimeters   POSTURE: rounded shoulders, forward head, and decreased lumbar lordosis  PALPATION: 01/07/23: Pt with noted pain with palpation around med/lateral joint line of Rt knee, noted tenderness in distal hamstring tendons  LOWER EXTREMITY ROM:   ROM Right 01/07/23 Left 01/07/23 Right 01/10/23 Right  01/15/23 Rt 01/20/23 Rt 01/22/23 Rt 01/31/23 Rt 02/04/23 Right 02/06/2023 Rt 02/11/23  Hip flexion            Hip abduction            Hip adduction            Knee flexion 80 110 Passive 98 Supine AA 100*  Sitting P: 100 Supine A: 95 P: 100 105 active A: 104 P: 110 Active 104  A:105  Knee extension   -10    -4 active A: -4  P: -2 Active -2 A: -2  Ankle dorsiflexion            Ankle plantarflexion             (Blank rows = not tested)  LOWER EXTREMITY MMT:  MMT Right 01/07/23 Left 01/07/23 Left/Right 02/06/2023 in pounds Rt 02/11/23  Hip flexion 4 5    Hip extension      Hip abduction      Hip adduction      Knee flexion 3 5    Knee extension 3 5 39.0/36.8 4   Ankle dorsiflexion      Ankle plantarflexion       (Blank rows = not tested)    FUNCTIONAL TESTS:  01/07/23: 5 time sit to stand: 24 sec c UE support  GAIT: Distance walked: 20 feet  Assistive device utilized: Environmental consultant - 2 wheeled Level of assistance: Modified independence Comments: decreased step length, decreased wt shift to Rt    TODAY'S TREATMENT                                                                          DATE:  02/13/23  TherEx  Precor bike seat 6 x6 minutes (SciFit not available)- actually able to get full rotations but with hip hike, cues to reduce hip compensations Knee flexion stretch 8 inch box x10 with 5 second holds  Forward step up R LE x20 U UE support  Lateral step ups R LE x15 U UE support  Shuttle  LE press: DL 60# with stretches into flexion and extension x15, R LE 37# x15 with stretches into flexion and extension  HS curls 0# x15 with  3 second holds cues to keep thigh perpendicular to ground  STS 5# in R UE x12 no UEs  STS 5# in goblet hold x10 no UEs    02/11/2023: TherEx Nustep: level 6 x 8 minutes Step ups 6 inch  x 15 leading with Rt LE c UE support Lateral step up x 10 leading with each LE (straddling the 6 inch step) Curb step no UE support c close supervision Leg curl machine: Rt LE only 15# 2 x 10 Leg extension machine: bil LE's lifting, Rt lowering, 10# 2 x 10  NMR:  Rocker  board: both directions x 1 minute each Side stepping around parallel bars 2 laps each direction Manual: Seated PROM Rt knee: knee flexion/extension  Modalities;  Vasopneumatic: 34 degrees x 10 minutes, Rt LE , medium compression   02/06/2023 Recumbent bike Seat 6 for 5 minutes AAROM to AROM Tailgate knee flexion 1 minute Quadriceps sets with right heel prop 10X 5 seconds Supine straight leg raises (no quad lag) 10X 3 seconds Seated AAROM knee flexion (left pushes right into flexion) 10X 10 seconds  Functional Activities: Double Leg Press 75# 15X slow eccentrics, full extension and stretch into flexion Single Leg Press 37# 10X slow eccentrics, full extension and stretch into flexion  Vaso Bil knees 10 minutes Medium 34*     02/04/2023: TherEx Recumbent bike: seat 6 partial to full revolutions x 5 , seat 5 full revolutions x 3 minutes Calf stretch on slant board: x 2 holding 30 seconds Step ups 6 inch  x 15 leading with Rt LE c UE support Leg Press: bil LE 75 # x 20, Rt LE :50# x 12 Forward lunge x 10 holding end range stretch x 10  NMR:  Fitter fit rocker 2 pivots board: both directions x 1 minute each Standing staggered stance on Airex x 1 minute each LE leading Standing feet together on Airex x 30 seconds with close supervision Manual: PROM Rt knee: knee flexion/extension  Seated  with IR and flexion of Rt knee Modalities;  Vasopneumatic: 34 degrees x 10 minutes, Rt LE , medium compression      PATIENT EDUCATION:  Education details: HEP, POC Person educated: Patient Education method: Programmer, multimedia, Demonstration, Verbal cues, and Handouts Education comprehension: verbalized understanding, returned demonstration, and verbal cues required  HOME EXERCISE PROGRAM: Access Code: [email or web address] URL: [email or web address] Date: 01/07/2023 Prepared by: Narda Amber  Exercises - Supine Quadricep Sets  - 2-3 x daily - 7 x weekly - 15 reps - Supine Active Straight Leg Raise  - 2-3 x daily - 7 x weekly - 15 reps - Supine Heel Slides  - 2-3 x daily - 7 x weekly - 15 reps - Seated Long Arc Quad  - 2-3 x daily - 7 x weekly - 15 reps - Sit to Stand with Counter Support  - 2-3 x daily - 7 x weekly - 10 reps  ASSESSMENT  CLINICAL IMPRESSION:  Cantrell arrives today doing well, sounds like referring is happy with her progress thus far, wants her to continue with PT. Tried the [employer] bike for ROM today, otherwise mostly continued working on functional strength this session. Pain improved to 2/10 at EOS. Will continue per POC.   OBJECTIVE IMPAIRMENTS: decreased activity tolerance, decreased balance, decreased mobility, difficulty walking, decreased ROM, decreased strength, increased edema, impaired flexibility, and pain.   ACTIVITY LIMITATIONS: sitting, standing, squatting, sleeping, stairs, and transfers  PARTICIPATION LIMITATIONS: meal prep, cleaning, laundry, and community activity  PERSONAL FACTORS: Age and  3+ comorbidities: see above  are also affecting patient's functional outcome.   REHAB POTENTIAL: Good  CLINICAL DECISION MAKING: Stable/uncomplicated  EVALUATION COMPLEXITY: Low  GOALS: Goals reviewed with patient? Yes  SHORT TERM GOALS: (target date for Short term goals are 3 weeks 01/31/23)   1.  Patient will demonstrate independent use of home  exercise program to maintain progress from in clinic treatments.  Goal status: MET 01/22/23  LONG TERM GOALS: (target dates for all long term goals are 12 weeks  04/04/23 )   1. Patient will demonstrate/report pain at worst less than or equal to 2/10 to facilitate minimal limitation in daily activity secondary to pain symptoms.  Goal status:On-going: 02/06/23   2. Patient will demonstrate independent use of home exercise program to facilitate ability to maintain/progress functional gains from skilled physical therapy services.  Goal status: On Going 02/11/2023   3. Patient will demonstrate FOTO outcome > or = 57 % to indicate reduced disability due to condition.  Goal status: Met 02/06/2023   4.  Patient will demonstrate Rt LE MMT >/= 4/5 throughout to faciltiate usual transfers, stairs, squatting at [facility] for daily life.   Goal status:on-going 02/11/23   5.  Patient will demonstrate improved sit to stand to </= 14 seconds with no UE support.   Goal status: On Going 02/11/2023   6.  Pt will be able to amb in community around neighborhood for >/= 20 minutes safely.    Goal status: Partially Met  02/11/23  7. Pt will be able to navigate up and down on curb step with no device safely.    Goal status: MET 02/11/23    PLAN:  PT FREQUENCY: 1-2x/week  PT DURATION: 4-6 weeks  PLANNED INTERVENTIONS: Therapeutic exercises, Therapeutic activity, Neuro Muscular re-education, Balance training, Gait training, Patient/Family education, Joint mobilization, Stair training, DME instructions, Dry Needling, Electrical stimulation, Traction, Cryotherapy, vasopneumatic deviceMoist heat, Taping, Ultrasound, Ionotophoresis 4mg /ml Dexamethasone, and aquatic therapy, Manual therapy.  All included unless contraindicated  PLAN FOR NEXT SESSION: AROM progressions, quadriceps and general lower extremity strengthening, balance, stairs and gait progressions to get her more comfortable with these activities and  prepare for independent rehabilitation.   Nedra Hai PT DPT PN2

## 2023-02-18 ENCOUNTER — Encounter: Payer: Self-pay | Admitting: Physical Therapy

## 2023-02-18 ENCOUNTER — Ambulatory Visit: Payer: Medicare HMO | Admitting: Physical Therapy

## 2023-02-18 DIAGNOSIS — R262 Difficulty in walking, not elsewhere classified: Secondary | ICD-10-CM

## 2023-02-18 DIAGNOSIS — M25661 Stiffness of right knee, not elsewhere classified: Secondary | ICD-10-CM | POA: Diagnosis not present

## 2023-02-18 DIAGNOSIS — R600 Localized edema: Secondary | ICD-10-CM

## 2023-02-18 DIAGNOSIS — M6281 Muscle weakness (generalized): Secondary | ICD-10-CM

## 2023-02-18 DIAGNOSIS — M25561 Pain in right knee: Secondary | ICD-10-CM

## 2023-02-18 NOTE — Therapy (Addendum)
OUTPATIENT PHYSICAL THERAPY LOWER EXTREMITY  TREATMENT     Patient Name: Cristina Sawyer MRN: [id number] DOB:11-15-1940, 82 y.o., female Today's Date: 02/18/2023  END OF SESSION:  PT End of Session - 02/18/23 1149     Visit Number 13    Number of Visits 24    Date for PT Re-Evaluation 04/04/23    Progress Note Due on Visit 20    PT Start Time 1145    PT Stop Time 1223    PT Time Calculation (min) 38 min    Activity Tolerance Patient tolerated treatment well    Behavior During Therapy [facility] for tasks assessed/performed                 Past Medical History:  Diagnosis Date   Arthritis    Cataract    Chronic kidney disease    Diverticulosis    Dyslipidemia    GERD (gastroesophageal reflux disease)    HTN (hypertension) 2015   Personal history of colonic adenoma 08/09/2008   Vitamin D deficiency    Past Surgical History:  Procedure Laterality Date   ABDOMINAL HYSTERECTOMY  1975   APPENDECTOMY  1975   COLONOSCOPY     HAND SURGERY     Cyst removal   THYROID SURGERY  2000   to remove nodule   TOTAL KNEE ARTHROPLASTY Right 12/23/2022   Procedure: RIGHT TOTAL KNEE ARTHROPLASTY;  Surgeon: Tarry Kos, MD;  Location: [facility] OR;  Service: Orthopedics;  Laterality: Right;   Patient Active Problem List   Diagnosis Date Noted   Status post total right knee replacement 12/23/2022   Primary osteoarthritis of right knee 12/22/2022   History of GI bleed 06/06/2022   Osteopenia 08/30/2021   Aortic atherosclerosis (HCC) 07/12/2021   Diverticulosis 07/12/2021   Leukocytosis 07/05/2021   GAD (generalized anxiety disorder) 07/05/2021   Personal history of calcium pyrophosphate deposition disease (CPPD) 04/11/2020   History of vitamin D deficiency 07/26/2019   Age-related cataract of both eyes 05/19/2018   Hyperlipidemia 05/19/2018   CKD (chronic kidney disease) stage 3, GFR 30-59 ml/min (HCC) 02/14/2018   Essential hypertension 05/12/2017   Transient cerebral ischemia  05/12/2017   Seasonal allergic rhinitis due to pollen 12/29/2014   GERD (gastroesophageal reflux disease) 01/17/2014   Personal history of colonic adenoma 08/09/2008    PCP:  Ronnald Nian, MD   REFERRING PROVIDER: Tarry Kos, MD   REFERRING DIAG: -10-CM) - Primary osteoarthritis of right knee Z96.651 (ICD-10-CM) - Status post total right knee replacement  THERAPY DIAG:  Acute pain of right knee  Stiffness of right knee, not elsewhere classified  Difficulty in walking, not elsewhere classified  Localized edema  Muscle weakness (generalized)  Rationale for Evaluation and Treatment: Rehabilitation  ONSET DATE: 12/23/22  SUBJECTIVE:   SUBJECTIVE STATEMENT: Pt arriving today reporting still having some difficulty with walking up the incline in her driveway.   PERTINENT HISTORY: Rt TKA on 12/23/22.OA, cataract, CKD, diverticulosis, dyslipidemia, HTN, GERD.  PAIN:  NPRS scale: 3-4/10  Pain location: R knee, BLEs in general  Pain description: soreness  Aggravating factors: being on knee too much, doing too much at one time  Relieving factors: ice, pain meds, movement   PRECAUTIONS: None  WEIGHT BEARING RESTRICTIONS: No  FALLS:  Has patient fallen in last 6 months? No  LIVING ENVIRONMENT: Lives with: alone Lives in: House/apartment Stairs: Yes: External: 1 steps; none Has following equipment at home: Dan Humphreys - 2 wheeled  OCCUPATION: retired  PLOF: Independent  PATIENT GOALS: Walk without pain  Next MD visit: no follow up scheduled at this time  OBJECTIVE:   DIAGNOSTIC FINDINGS:  IMPRESSION: Postsurgical changes from right knee arthroplasty including subcutaneous and intra-articular air, as well as a joint effusion.  PATIENT SURVEYS:  02/06/2023: FOTO 57 (Goal met)  01/07/23: FOTO intake:  40% , predicted 57%  COGNITION: Overall cognitive status: WFL    SENSATION: WFL  EDEMA:  01/07/23: Circumferential: Rt: 45 centimeters , Left: 42  centimeters   POSTURE: rounded shoulders, forward head, and decreased lumbar lordosis  PALPATION: 01/07/23: Pt with noted pain with palpation around med/lateral joint line of Rt knee, noted tenderness in distal hamstring tendons  LOWER EXTREMITY ROM:   ROM Right 01/07/23 Left 01/07/23 Right 01/10/23 Right  01/15/23 Rt 01/20/23 Rt 01/22/23 Rt 01/31/23 Rt 02/04/23 Right 02/06/2023 Rt 02/11/23  Hip flexion            Hip abduction            Hip adduction            Knee flexion 80 110 Passive 98 Supine AA 100*  Sitting P: 100 Supine A: 95 P: 100 105 active A: 104 P: 110 Active 104  A:105  Knee extension   -10    -4 active A: -4  P: -2 Active -2 A: -2  Ankle dorsiflexion            Ankle plantarflexion             (Blank rows = not tested)  LOWER EXTREMITY MMT:  MMT Right 01/07/23 Left 01/07/23 Left/Right 02/06/2023 in pounds Rt 02/11/23  Hip flexion 4 5    Hip extension      Hip abduction      Hip adduction      Knee flexion 3 5    Knee extension 3 5 39.0/36.8 4   Ankle dorsiflexion      Ankle plantarflexion       (Blank rows = not tested)    FUNCTIONAL TESTS:  01/07/23: 5 time sit to stand: 24 sec c UE support  GAIT: Distance walked: 20 feet  Assistive device utilized: Walker - 2 wheeled Level of assistance: Modified independence Comments: decreased step length, decreased wt shift to Rt    TODAY'S TREATMENT                                                                          DATE: 02/18/2023: TherEx Nustep: level 6 x 6 minutes Step ups 6 inch  x 15 leading with Rt and left LE c UE support Calf stretch: x 2 holding 30 seconds Leg Press: 75# bil LE 2 x 10 , Rt LE 31# x 10  Leg curl machine: Rt LE only 15#  x 10 Leg extension machine: bil LE's lifting, Rt lowering, 10# 2 x 10  Gait Amb on community surfaces out side clinic building, up and down small ramp, over curb step, walking up and down inclines, walking in grassy areas and mulch bed c CGA via gait belt and no  device Navigation down clinic lobby stairs with single hand rail. Pt having difficulty with reciprocal gait pattern, leading more with  her Rt LE. Performed  with CGA via gait belt      02/13/23  TherEx  Precor bike seat 6 x6 minutes (SciFit not available)- actually able to get full rotations but with hip hike, cues to reduce hip compensations Knee flexion stretch 8 inch box x10 with 5 second holds  Forward step up R LE x20 U UE support  Lateral step ups R LE x15 U UE support  Shuttle LE press: DL 16# with stretches into flexion and extension x15, R LE 37# x15 with stretches into flexion and extension  HS curls 0# x15 with 3 second holds cues to keep thigh perpendicular to ground  STS 5# in R UE x12 no UEs  STS 5# in goblet hold x10 no UEs    02/11/2023: TherEx Nustep: level 6 x 8 minutes Step ups 6 inch  x 15 leading with Rt LE c UE support Lateral step up x 10 leading with each LE (straddling the 6 inch step) Curb step no UE support c close supervision Leg curl machine: Rt LE only 15# 2 x 10 Leg extension machine: bil LE's lifting, Rt lowering, 10# 2 x 10  NMR:  Rocker  board: both directions x 1 minute each Side stepping around parallel bars 2 laps each direction Manual: Seated PROM Rt knee: knee flexion/extension  Modalities;  Vasopneumatic: 34 degrees x 10 minutes, Rt LE , medium compression   02/06/2023 Recumbent bike Seat 6 for 5 minutes AAROM to AROM Tailgate knee flexion 1 minute Quadriceps sets with right heel prop 10X 5 seconds Supine straight leg raises (no quad lag) 10X 3 seconds Seated AAROM knee flexion (left pushes right into flexion) 10X 10 seconds  Functional Activities: Double Leg Press 75# 15X slow eccentrics, full extension and stretch into flexion Single Leg Press 37# 10X slow eccentrics, full extension and stretch into flexion  Vaso Bil knees 10 minutes Medium 34*           PATIENT EDUCATION:  Education details: HEP, POC Person  educated: Patient Education method: Programmer, multimedia, Demonstration, Verbal cues, and Handouts Education comprehension: verbalized understanding, returned demonstration, and verbal cues required  HOME EXERCISE PROGRAM: Access Code: [email or web address] URL: [email or web address] Date: 01/07/2023 Prepared by: Narda Amber  Exercises - Supine Quadricep Sets  - 2-3 x daily - 7 x weekly - 15 reps - Supine Active Straight Leg Raise  - 2-3 x daily - 7 x weekly - 15 reps - Supine Heel Slides  - 2-3 x daily - 7 x weekly - 15 reps - Seated Long Arc Quad  - 2-3 x daily - 7 x weekly - 15 reps - Sit to Stand with Counter Support  - 2-3 x daily - 7 x weekly - 10 reps  ASSESSMENT  CLINICAL IMPRESSION: Pt making progress with overall functional mobility. Community amb and stairs performed today during her visit c CGA to SBA. Continue skilled PT interventions to maximize pt's function.   OBJECTIVE IMPAIRMENTS: decreased activity tolerance, decreased balance, decreased mobility, difficulty walking, decreased ROM, decreased strength, increased edema, impaired flexibility, and pain.   ACTIVITY LIMITATIONS: sitting, standing, squatting, sleeping, stairs, and transfers  PARTICIPATION LIMITATIONS: meal prep, cleaning, laundry, and community activity  PERSONAL FACTORS: Age and 3+ comorbidities: see above  are also affecting patient's functional outcome.   REHAB POTENTIAL: Good  CLINICAL DECISION MAKING: Stable/uncomplicated  EVALUATION COMPLEXITY: Low  GOALS: Goals reviewed with patient? Yes  SHORT TERM GOALS: (target date for Short term goals are 3 weeks 01/31/23)   1.  Patient will demonstrate  independent use of home exercise program to maintain progress from in clinic treatments.  Goal status: MET 01/22/23  LONG TERM GOALS: (target dates for all long term goals are 12 weeks  04/04/23 )   1. Patient will demonstrate/report pain at worst less than or equal to 2/10 to facilitate minimal  limitation in daily activity secondary to pain symptoms.  Goal status:On-going: 02/06/23   2. Patient will demonstrate independent use of home exercise program to facilitate ability to maintain/progress functional gains from skilled physical therapy services.  Goal status: On Going 02/11/2023   3. Patient will demonstrate FOTO outcome > or = 57 % to indicate reduced disability due to condition.  Goal status: Met 02/06/2023   4.  Patient will demonstrate Rt LE MMT >/= 4/5 throughout to faciltiate usual transfers, stairs, squatting at [facility] for daily life.   Goal status:on-going 02/11/23   5.  Patient will demonstrate improved sit to stand to </= 14 seconds with no UE support.   Goal status: MET 02/18/23   6.  Pt will be able to amb in community around neighborhood for >/= 20 minutes safely.    Goal status: Partially Met  02/11/23  7. Pt will be able to navigate up and down on curb step with no device safely.    Goal status: MET 02/11/23    PLAN:  PT FREQUENCY: 1-2x/week  PT DURATION: 4-6 weeks  PLANNED INTERVENTIONS: Therapeutic exercises, Therapeutic activity, Neuro Muscular re-education, Balance training, Gait training, Patient/Family education, Joint mobilization, Stair training, DME instructions, Dry Needling, Electrical stimulation, Traction, Cryotherapy, vasopneumatic deviceMoist heat, Taping, Ultrasound, Ionotophoresis 4mg /ml Dexamethasone, and aquatic therapy, Manual therapy.  All included unless contraindicated  PLAN FOR NEXT SESSION: AROM progressions, quadriceps and general lower extremity strengthening, balance, stairs and gait progressions to get her more comfortable with these activities and prepare for independent rehabilitation.   Narda Amber, PT, MPT 02/18/23 12:34 PM

## 2023-02-21 ENCOUNTER — Encounter: Payer: Medicare HMO | Admitting: Physical Therapy

## 2023-02-25 ENCOUNTER — Encounter: Payer: Self-pay | Admitting: Physical Therapy

## 2023-02-25 ENCOUNTER — Ambulatory Visit: Payer: Medicare HMO | Admitting: Physical Therapy

## 2023-02-25 DIAGNOSIS — M25661 Stiffness of right knee, not elsewhere classified: Secondary | ICD-10-CM | POA: Diagnosis not present

## 2023-02-25 DIAGNOSIS — M25561 Pain in right knee: Secondary | ICD-10-CM

## 2023-02-25 DIAGNOSIS — M6281 Muscle weakness (generalized): Secondary | ICD-10-CM | POA: Diagnosis not present

## 2023-02-25 DIAGNOSIS — R262 Difficulty in walking, not elsewhere classified: Secondary | ICD-10-CM

## 2023-02-25 DIAGNOSIS — R600 Localized edema: Secondary | ICD-10-CM

## 2023-02-25 NOTE — Therapy (Addendum)
OUTPATIENT PHYSICAL THERAPY LOWER EXTREMITY  TREATMENT     Patient Name: Cristina Sawyer MRN: [id number] DOB:04-03-1941, 82 y.o., female Today's Date: 02/25/2023  END OF SESSION:  PT End of Session - 02/25/23 1204     Visit Number 14    Number of Visits 24    Date for PT Re-Evaluation 04/04/23    Progress Note Due on Visit 20    PT Start Time 1145    PT Stop Time 1230    PT Time Calculation (min) 45 min    Activity Tolerance Patient tolerated treatment well    Behavior During Therapy [facility] for tasks assessed/performed                 Past Medical History:  Diagnosis Date   Arthritis    Cataract    Chronic kidney disease    Diverticulosis    Dyslipidemia    GERD (gastroesophageal reflux disease)    HTN (hypertension) 2015   Personal history of colonic adenoma 08/09/2008   Vitamin D deficiency    Past Surgical History:  Procedure Laterality Date   ABDOMINAL HYSTERECTOMY  1975   APPENDECTOMY  1975   COLONOSCOPY     HAND SURGERY     Cyst removal   THYROID SURGERY  2000   to remove nodule   TOTAL KNEE ARTHROPLASTY Right 12/23/2022   Procedure: RIGHT TOTAL KNEE ARTHROPLASTY;  Surgeon: Tarry Kos, MD;  Location: [facility] OR;  Service: Orthopedics;  Laterality: Right;   Patient Active Problem List   Diagnosis Date Noted   Status post total right knee replacement 12/23/2022   Primary osteoarthritis of right knee 12/22/2022   History of GI bleed 06/06/2022   Osteopenia 08/30/2021   Aortic atherosclerosis (HCC) 07/12/2021   Diverticulosis 07/12/2021   Leukocytosis 07/05/2021   GAD (generalized anxiety disorder) 07/05/2021   Personal history of calcium pyrophosphate deposition disease (CPPD) 04/11/2020   History of vitamin D deficiency 07/26/2019   Age-related cataract of both eyes 05/19/2018   Hyperlipidemia 05/19/2018   CKD (chronic kidney disease) stage 3, GFR 30-59 ml/min (HCC) 02/14/2018   Essential hypertension 05/12/2017   Transient cerebral ischemia  05/12/2017   Seasonal allergic rhinitis due to pollen 12/29/2014   GERD (gastroesophageal reflux disease) 01/17/2014   Personal history of colonic adenoma 08/09/2008    PCP:  Ronnald Nian, MD   REFERRING PROVIDER: Tarry Kos, MD   REFERRING DIAG: -10-CM) - Primary osteoarthritis of right knee Z96.651 (ICD-10-CM) - Status post total right knee replacement  THERAPY DIAG:  Acute pain of right knee  Stiffness of right knee, not elsewhere classified  Difficulty in walking, not elsewhere classified  Localized edema  Muscle weakness (generalized)  Rationale for Evaluation and Treatment: Rehabilitation  ONSET DATE: 12/23/22  SUBJECTIVE:   SUBJECTIVE STATEMENT: Pt arriving today reporting still having some difficulty with walking up the incline in her driveway.   PERTINENT HISTORY: Rt TKA on 12/23/22.OA, cataract, CKD, diverticulosis, dyslipidemia, HTN, GERD.  PAIN:  NPRS scale: 3-4/10  Pain location: R knee, BLEs in general  Pain description: soreness  Aggravating factors: being on knee too much, doing too much at one time  Relieving factors: ice, pain meds, movement   PRECAUTIONS: None  WEIGHT BEARING RESTRICTIONS: No  FALLS:  Has patient fallen in last 6 months? No  LIVING ENVIRONMENT: Lives with: alone Lives in: House/apartment Stairs: Yes: External: 1 steps; none Has following equipment at home: Dan Humphreys - 2 wheeled  OCCUPATION: retired  PLOF: Independent  PATIENT GOALS: Walk without pain  Next MD visit: no follow up scheduled at this time  OBJECTIVE:   DIAGNOSTIC FINDINGS:  IMPRESSION: Postsurgical changes from right knee arthroplasty including subcutaneous and intra-articular air, as well as a joint effusion.  PATIENT SURVEYS:  02/06/2023: FOTO 57 (Goal met)  01/07/23: FOTO intake:  40% , predicted 57%  COGNITION: Overall cognitive status: WFL    SENSATION: WFL  EDEMA:  01/07/23: Circumferential: Rt: 45 centimeters , Left: 42  centimeters   POSTURE: rounded shoulders, forward head, and decreased lumbar lordosis  PALPATION: 01/07/23: Pt with noted pain with palpation around med/lateral joint line of Rt knee, noted tenderness in distal hamstring tendons  LOWER EXTREMITY ROM:   ROM Right 01/07/23 Left 01/07/23 Right 01/10/23 Right  01/15/23 Rt 01/20/23 Rt 01/22/23 Rt 01/31/23 Rt 02/04/23 Right 02/06/2023 Rt 02/11/23  Hip flexion            Hip abduction            Hip adduction            Knee flexion 80 110 Passive 98 Supine AA 100*  Sitting P: 100 Supine A: 95 P: 100 105 active A: 104 P: 110 Active 104  A:105  Knee extension   -10    -4 active A: -4  P: -2 Active -2 A: -2  Ankle dorsiflexion            Ankle plantarflexion             (Blank rows = not tested)  LOWER EXTREMITY MMT:  MMT Right 01/07/23 Left 01/07/23 Left/Right 02/06/2023 in pounds Rt 02/11/23  Hip flexion 4 5    Hip extension      Hip abduction      Hip adduction      Knee flexion 3 5    Knee extension 3 5 39.0/36.8 4   Ankle dorsiflexion      Ankle plantarflexion       (Blank rows = not tested)    FUNCTIONAL TESTS:  01/07/23: 5 time sit to stand: 24 sec c UE support  GAIT: Distance walked: 20 feet  Assistive device utilized: Walker - 2 wheeled Level of assistance: Modified independence Comments: decreased step length, decreased wt shift to Rt    TODAY'S TREATMENT                                                                          DATE: 02/25/2023: TherEx Nustep: level 6 x 8 minutes, seat 8 UE/LE Step ups 6 inch  x 15 leading with Rt and left LE c UE support Lateral step ups: x 15 c UE support leading with Rt LE TKE: 2 x 10 green TB  Calf stretch: x 2 holding 30 seconds Leg Press: 75# bil LE's 2 x 10, Rt LE only 37# 2 x 10  TRX: squats x 10 NMR:  Side stepping x 4 laps in parallel bars c UE support Tandem walking on beam x 6 trial with intermittent UE support Side stepping on Beam x 4  Stepping over 6 inch  hurdles Modalities:  Vasopneumatic, medium compression, 34 degress x 10 minutes       02/18/2023: TherEx Nustep: level 6  x 6 minutes Step ups 6 inch  x 15 leading with Rt and left LE c UE support Calf stretch: x 2 holding 30 seconds Leg Press: 75# bil LE's 2 x 10, Rt LE 31# x 10 Leg curl machine: Rt LE only 15#  x 10 Leg extension machine: bil LE's lifting, Rt lowering, 10# 2 x 10  Gait Amb on community surfaces out side clinic building, up and down small ramp, over curb step, walking up and down inclines, walking in grassy areas and mulch bed c CGA via gait belt and no device Navigation down clinic lobby stairs with single hand rail. Pt having difficulty with reciprocal gait pattern, leading more with  her Rt LE. Performed with CGA via gait belt      02/13/23  TherEx  Precor bike seat 6 x6 minutes (SciFit not available)- actually able to get full rotations but with hip hike, cues to reduce hip compensations Knee flexion stretch 8 inch box x10 with 5 second holds  Forward step up R LE x20 U UE support  Lateral step ups R LE x15 U UE support  Shuttle LE press: DL 09# with stretches into flexion and extension x15, R LE 37# x15 with stretches into flexion and extension  HS curls 0# x15 with 3 second holds cues to keep thigh perpendicular to ground  STS 5# in R UE x12 no UEs  STS 5# in goblet hold x10 no UEs    02/11/2023: TherEx Nustep: level 6 x 8 minutes Step ups 6 inch  x 15 leading with Rt LE c UE support Lateral step up x 10 leading with each LE (straddling the 6 inch step) Curb step no UE support c close supervision Leg curl machine: Rt LE only 15# 2 x 10 Leg extension machine: bil LE's lifting, Rt lowering, 10# 2 x 10  NMR:  Rocker  board: both directions x 1 minute each Side stepping around parallel bars 2 laps each direction Manual: Seated PROM Rt knee: knee flexion/extension  Modalities;  Vasopneumatic: 34 degrees x 10 minutes, Rt LE , medium  compression       PATIENT EDUCATION:  Education details: HEP, POC Person educated: Patient Education method: Programmer, multimedia, Demonstration, Verbal cues, and Handouts Education comprehension: verbalized understanding, returned demonstration, and verbal cues required  HOME EXERCISE PROGRAM: Access Code: [email or web address] URL: [email or web address] Date: 01/07/2023 Prepared by: Narda Amber  Exercises - Supine Quadricep Sets  - 2-3 x daily - 7 x weekly - 15 reps - Supine Active Straight Leg Raise  - 2-3 x daily - 7 x weekly - 15 reps - Supine Heel Slides  - 2-3 x daily - 7 x weekly - 15 reps - Seated Long Arc Quad  - 2-3 x daily - 7 x weekly - 15 reps - Sit to Stand with Counter Support  - 2-3 x daily - 7 x weekly - 10 reps  ASSESSMENT  CLINICAL IMPRESSION: Pt arriving today reporting 5/10 pain in her Rt knee. Pt reporting increased pain with flexion based activities. Treatment focusing on LE strengthening and dynamic balance. Recommend continue skilled PT interventions to maximize pt's function.   OBJECTIVE IMPAIRMENTS: decreased activity tolerance, decreased balance, decreased mobility, difficulty walking, decreased ROM, decreased strength, increased edema, impaired flexibility, and pain.   ACTIVITY LIMITATIONS: sitting, standing, squatting, sleeping, stairs, and transfers  PARTICIPATION LIMITATIONS: meal prep, cleaning, laundry, and community activity  PERSONAL FACTORS: Age and 3+ comorbidities: see above  are also affecting patient's functional outcome.  REHAB POTENTIAL: Good  CLINICAL DECISION MAKING: Stable/uncomplicated  EVALUATION COMPLEXITY: Low  GOALS: Goals reviewed with patient? Yes  SHORT TERM GOALS: (target date for Short term goals are 3 weeks 01/31/23)   1.  Patient will demonstrate independent use of home exercise program to maintain progress from in clinic treatments.  Goal status: MET 01/22/23  LONG TERM GOALS: (target dates for all long term  goals are 12 weeks  04/04/23 )   1. Patient will demonstrate/report pain at worst less than or equal to 2/10 to facilitate minimal limitation in daily activity secondary to pain symptoms.  Goal status:On-going: 02/06/23   2. Patient will demonstrate independent use of home exercise program to facilitate ability to maintain/progress functional gains from skilled physical therapy services.  Goal status: On Going 02/11/2023   3. Patient will demonstrate FOTO outcome > or = 57 % to indicate reduced disability due to condition.  Goal status: Met 02/06/2023   4.  Patient will demonstrate Rt LE MMT >/= 4/5 throughout to faciltiate usual transfers, stairs, squatting at [facility] for daily life.   Goal status:on-going 02/11/23   5.  Patient will demonstrate improved sit to stand to </= 14 seconds with no UE support.   Goal status: MET 02/18/23   6.  Pt will be able to amb in community around neighborhood for >/= 20 minutes safely.    Goal status: Partially Met  02/11/23  7. Pt will be able to navigate up and down on curb step with no device safely.    Goal status: MET 02/11/23    PLAN:  PT FREQUENCY: 1-2x/week  PT DURATION: 4-6 weeks  PLANNED INTERVENTIONS: Therapeutic exercises, Therapeutic activity, Neuro Muscular re-education, Balance training, Gait training, Patient/Family education, Joint mobilization, Stair training, DME instructions, Dry Needling, Electrical stimulation, Traction, Cryotherapy, vasopneumatic deviceMoist heat, Taping, Ultrasound, Ionotophoresis 4mg /ml Dexamethasone, and aquatic therapy, Manual therapy.  All included unless contraindicated  PLAN FOR NEXT SESSION: AROM progressions, quadriceps and general lower extremity strengthening, balance, stairs and gait progressions to get her more comfortable with these activities and prepare for independent rehabilitation.   Narda Amber, PT, MPT 02/25/23 12:30 PM

## 2023-02-28 ENCOUNTER — Encounter: Payer: Self-pay | Admitting: Rehabilitative and Restorative Service Providers"

## 2023-02-28 ENCOUNTER — Ambulatory Visit: Payer: Medicare HMO | Admitting: Rehabilitative and Restorative Service Providers"

## 2023-02-28 DIAGNOSIS — M6281 Muscle weakness (generalized): Secondary | ICD-10-CM

## 2023-02-28 DIAGNOSIS — R600 Localized edema: Secondary | ICD-10-CM | POA: Diagnosis not present

## 2023-02-28 DIAGNOSIS — R262 Difficulty in walking, not elsewhere classified: Secondary | ICD-10-CM | POA: Diagnosis not present

## 2023-02-28 DIAGNOSIS — M25561 Pain in right knee: Secondary | ICD-10-CM

## 2023-02-28 DIAGNOSIS — M25661 Stiffness of right knee, not elsewhere classified: Secondary | ICD-10-CM | POA: Diagnosis not present

## 2023-02-28 NOTE — Therapy (Signed)
OUTPATIENT PHYSICAL THERAPY LOWER EXTREMITY  TREATMENT     Patient Name: SOLANGIE GANLEY MRN: [id number] DOB:1941/07/27, 82 y.o., female Today's Date: 02/28/2023  END OF SESSION:  PT End of Session - 02/28/23 1610     Visit Number 15    Number of Visits 24    Date for PT Re-Evaluation 04/04/23    Authorization - Visit Number 15    Progress Note Due on Visit 20    PT Start Time 1435    PT Stop Time 1515    PT Time Calculation (min) 40 min    Activity Tolerance Patient tolerated treatment well;No increased pain    Behavior During Therapy WFL for tasks assessed/performed                  Past Medical History:  Diagnosis Date   Arthritis    Cataract    Chronic kidney disease    Diverticulosis    Dyslipidemia    GERD (gastroesophageal reflux disease)    HTN (hypertension) 2015   Personal history of colonic adenoma 08/09/2008   Vitamin D deficiency    Past Surgical History:  Procedure Laterality Date   ABDOMINAL HYSTERECTOMY  1975   APPENDECTOMY  1975   COLONOSCOPY     HAND SURGERY     Cyst removal   THYROID SURGERY  2000   to remove nodule   TOTAL KNEE ARTHROPLASTY Right 12/23/2022   Procedure: RIGHT TOTAL KNEE ARTHROPLASTY;  Surgeon: Tarry Kos, MD;  Location: [facility] OR;  Service: Orthopedics;  Laterality: Right;   Patient Active Problem List   Diagnosis Date Noted   Status post total right knee replacement 12/23/2022   Primary osteoarthritis of right knee 12/22/2022   History of GI bleed 06/06/2022   Osteopenia 08/30/2021   Aortic atherosclerosis (HCC) 07/12/2021   Diverticulosis 07/12/2021   Leukocytosis 07/05/2021   GAD (generalized anxiety disorder) 07/05/2021   Personal history of calcium pyrophosphate deposition disease (CPPD) 04/11/2020   History of vitamin D deficiency 07/26/2019   Age-related cataract of both eyes 05/19/2018   Hyperlipidemia 05/19/2018   CKD (chronic kidney disease) stage 3, GFR 30-59 ml/min (HCC) 02/14/2018   Essential  hypertension 05/12/2017   Transient cerebral ischemia 05/12/2017   Seasonal allergic rhinitis due to pollen 12/29/2014   GERD (gastroesophageal reflux disease) 01/17/2014   Personal history of colonic adenoma 08/09/2008    PCP:  Ronnald Nian, MD   REFERRING PROVIDER: Tarry Kos, MD   REFERRING DIAG: -10-CM) - Primary osteoarthritis of right knee Z96.651 (ICD-10-CM) - Status post total right knee replacement  THERAPY DIAG:  Acute pain of right knee  Stiffness of right knee, not elsewhere classified  Difficulty in walking, not elsewhere classified  Localized edema  Muscle weakness (generalized)  Rationale for Evaluation and Treatment: Rehabilitation  ONSET DATE: 12/23/22  SUBJECTIVE:   SUBJECTIVE STATEMENT: Dejiah is taking oxy before PT and before bed.  Otherwise, pain is managed by tylenol only.  PERTINENT HISTORY: Rt TKA on 12/23/22.OA, cataract, CKD, diverticulosis, dyslipidemia, HTN, GERD.  PAIN:  NPRS scale: 1-5/10 this week Pain location: R knee, BLEs in general  Pain description: soreness  Aggravating factors: being on knee too much, doing too much at one time  Relieving factors: ice, pain meds, movement   PRECAUTIONS: None  WEIGHT BEARING RESTRICTIONS: No  FALLS:  Has patient fallen in last 6 months? No  LIVING ENVIRONMENT: Lives with: alone Lives in: House/apartment Stairs: Yes: External: 1 steps; none Has following equipment  at home: Dan Humphreys - 2 wheeled  OCCUPATION: retired  PLOF: Independent  PATIENT GOALS: Walk without pain  Next MD visit: no follow up scheduled at this time  OBJECTIVE:   DIAGNOSTIC FINDINGS:  IMPRESSION: Postsurgical changes from right knee arthroplasty including subcutaneous and intra-articular air, as well as a joint effusion.  PATIENT SURVEYS:   02/06/2023: FOTO 57 (Goal met)  01/07/23: FOTO intake:  40% , predicted 57%  COGNITION: Overall cognitive status: WFL    SENSATION: WFL  EDEMA:  01/07/23:  Circumferential: Rt: 45 centimeters , Left: 42 centimeters   POSTURE: rounded shoulders, forward head, and decreased lumbar lordosis  PALPATION: 01/07/23: Pt with noted pain with palpation around med/lateral joint line of Rt knee, noted tenderness in distal hamstring tendons  LOWER EXTREMITY ROM:   ROM Right 01/07/23 Left 01/07/23 Right 01/10/23 Right  01/15/23 Rt 01/20/23 Rt 01/22/23 Rt 01/31/23 Rt 02/04/23 Right 02/06/2023 Rt 02/11/23 Rt 02/28/2023  Hip flexion             Hip abduction             Hip adduction             Knee flexion 80 110 Passive 98 Supine AA 100*  Sitting P: 100 Supine A: 95 P: 100 105 active A: 104 P: 110 Active 104  A:105 105  Knee extension   -10    -4 active A: -4  P: -2 Active -2 A: -2 -1  Ankle dorsiflexion             Ankle plantarflexion              (Blank rows = not tested)  LOWER EXTREMITY MMT:  MMT Right 01/07/23 Left 01/07/23 Left/Right 02/06/2023 in pounds Rt 02/11/23  Hip flexion 4 5    Hip extension      Hip abduction      Hip adduction      Knee flexion 3 5    Knee extension 3 5 39.0/36.8 4   Ankle dorsiflexion      Ankle plantarflexion       (Blank rows = not tested)    FUNCTIONAL TESTS:  01/07/23: 5 time sit to stand: 24 sec c UE support  GAIT: Distance walked: 20 feet  Assistive device utilized: Environmental consultant - 2 wheeled Level of assistance: Modified independence Comments: decreased step length, decreased wt shift to Rt    TODAY'S TREATMENT                                                                          DATE: 02/28/2023 [place] knee flexion 1 minute Quadriceps sets 10 x 10 seconds Seated knee flexion AAROM (left pushes right into flexion) 10 x 10 seconds  Neuromuscular re-education: Tandem balance 2 x 20 seconds bilateral Dynamic balance and gait drills (narrow gait, minimal lateral lean)  Functional Activities: Step-up and over 6 inch step (avoid hip ER) 20 x slow eccentrics without hand rails Lateral step-downs off  4 inch step (use hand rail) 10 x slow eccentrics   02/25/2023: TherEx Nustep: level 6 x 8 minutes, seat 8 UE/LE Step ups 6 inch  x 15 leading with Rt and left LE c UE support Lateral  step ups: x 15 c UE support leading with Rt LE TKE: 2 x 10 green TB  Calf stretch: x 2 holding 30 seconds Leg Press: 75# bil LE's 2 x 10, Rt LE only 37# 2 x 10  TRX: squats x 10 NMR:  Side stepping x 4 laps in parallel bars c UE support Tandem walking on beam x 6 trial with intermittent UE support Side stepping on Beam x 4  Stepping over 6 inch hurdles Modalities:  Vasopneumatic, medium compression, 34 degress x 10 minutes   02/18/2023: TherEx Nustep: level 6 x 6 minutes Step ups 6 inch  x 15 leading with Rt and left LE c UE support Calf stretch: x 2 holding 30 seconds Leg Press: 75# bil LE's 2 x 10, Rt LE 31# x 10 Leg curl machine: Rt LE only 15#  x 10 Leg extension machine: bil LE's lifting, Rt lowering, 10# 2 x 10  Gait Amb on community surfaces out side clinic building, up and down small ramp, over curb step, walking up and down inclines, walking in grassy areas and mulch bed c CGA via gait belt and no device Navigation down clinic lobby stairs with single hand rail. Pt having difficulty with reciprocal gait pattern, leading more with  her Rt LE. Performed with CGA via gait belt   PATIENT EDUCATION:  Education details: HEP, POC Person educated: Patient Education method: Explanation, Demonstration, Verbal cues, and Handouts Education comprehension: verbalized understanding, returned demonstration, and verbal cues required  HOME EXERCISE PROGRAM: Access Code: [email or web address] URL: [email or web address] Date: 01/07/2023 Prepared by: Narda Amber  Exercises - Supine Quadricep Sets  - 2-3 x daily - 7 x weekly - 15 reps - Supine Active Straight Leg Raise  - 2-3 x daily - 7 x weekly - 15 reps - Supine Heel Slides  - 2-3 x daily - 7 x weekly - 15 reps - Seated Long Arc Quad  - 2-3 x  daily - 7 x weekly - 15 reps - Sit to Stand with Counter Support  - 2-3 x daily - 7 x weekly - 10 reps  ASSESSMENT  CLINICAL IMPRESSION: AROM was 1 - 0 - 105 degrees today, which is excellent.  Weakness is noted with stairs and was addressed during today's visit.  Balance and gait without a cane are good and only mild hip abductors weakness is noted.  Continue quadriceps and hip abductors strength, knee flexion AROM and functional (stair) work with Janyth Contes and progress note in the near future recommended.  OBJECTIVE IMPAIRMENTS: decreased activity tolerance, decreased balance, decreased mobility, difficulty walking, decreased ROM, decreased strength, increased edema, impaired flexibility, and pain.   ACTIVITY LIMITATIONS: sitting, standing, squatting, sleeping, stairs, and transfers  PARTICIPATION LIMITATIONS: meal prep, cleaning, laundry, and community activity  PERSONAL FACTORS: Age and 3+ comorbidities: see above  are also affecting patient's functional outcome.   REHAB POTENTIAL: Good  CLINICAL DECISION MAKING: Stable/uncomplicated  EVALUATION COMPLEXITY: Low  GOALS: Goals reviewed with patient? Yes  SHORT TERM GOALS: (target date for Short term goals are 3 weeks 01/31/23)   1.  Patient will demonstrate independent use of home exercise program to maintain progress from in clinic treatments.  Goal status: MET 01/22/23  LONG TERM GOALS: (target dates for all long term goals are 12 weeks  04/04/23 )   1. Patient will demonstrate/report pain at worst less than or equal to 2/10 to facilitate minimal limitation in daily activity secondary to pain symptoms.  Goal status:On-going: 02/28/23   2. Patient  will demonstrate independent use of home exercise program to facilitate ability to maintain/progress functional gains from skilled physical therapy services.  Goal status: On Going 02/28/2023   3. Patient will demonstrate FOTO outcome > or = 57 % to indicate reduced disability due to  condition.  Goal status: Met 02/06/2023   4.  Patient will demonstrate Rt LE MMT >/= 4/5 throughout to faciltiate usual transfers, stairs, squatting at [facility] for daily life.   Goal status:on-going 02/11/23   5.  Patient will demonstrate improved sit to stand to </= 14 seconds with no UE support.   Goal status: MET 02/18/23   6.  Pt will be able to amb in community around neighborhood for >/= 20 minutes safely.    Goal status: Partially Met  02/28/23  7. Pt will be able to navigate up and down on curb step with no device safely.    Goal status: MET 02/11/23    PLAN:  PT FREQUENCY: 1-2x/week  PT DURATION: 4-6 weeks  PLANNED INTERVENTIONS: Therapeutic exercises, Therapeutic activity, Neuro Muscular re-education, Balance training, Gait training, Patient/Family education, Joint mobilization, Stair training, DME instructions, Dry Needling, Electrical stimulation, Traction, Cryotherapy, vasopneumatic deviceMoist heat, Taping, Ultrasound, Ionotophoresis 4mg /ml Dexamethasone, and aquatic therapy, Manual therapy.  All included unless contraindicated  PLAN FOR NEXT SESSION: Continue quadriceps and hip abductors strength, knee flexion AROM and functional (stair) work with Janyth Contes and progress note in the near future recommended.  Cherlyn Cushing  PT, MPT 02/28/23 4:16 PM

## 2023-03-04 ENCOUNTER — Ambulatory Visit (INDEPENDENT_AMBULATORY_CARE_PROVIDER_SITE_OTHER): Payer: Medicare HMO | Admitting: Physical Therapy

## 2023-03-04 ENCOUNTER — Encounter: Payer: Self-pay | Admitting: Physical Therapy

## 2023-03-04 DIAGNOSIS — M25661 Stiffness of right knee, not elsewhere classified: Secondary | ICD-10-CM

## 2023-03-04 DIAGNOSIS — M6281 Muscle weakness (generalized): Secondary | ICD-10-CM

## 2023-03-04 DIAGNOSIS — M25561 Pain in right knee: Secondary | ICD-10-CM | POA: Diagnosis not present

## 2023-03-04 DIAGNOSIS — R262 Difficulty in walking, not elsewhere classified: Secondary | ICD-10-CM | POA: Diagnosis not present

## 2023-03-04 DIAGNOSIS — R600 Localized edema: Secondary | ICD-10-CM | POA: Diagnosis not present

## 2023-03-04 NOTE — Therapy (Signed)
OUTPATIENT PHYSICAL THERAPY LOWER EXTREMITY  TREATMENT     Patient Name: Cristina Sawyer MRN: [id number] DOB:03/01/1941, 82 y.o., female Today's Date: 03/04/2023  END OF SESSION:  PT End of Session - 03/04/23 1415     Visit Number 16    Number of Visits 24    Authorization - Visit Number 16    Progress Note Due on Visit 20    PT Start Time 1352    PT Stop Time 1438    PT Time Calculation (min) 46 min    Activity Tolerance Patient tolerated treatment well;No increased pain    Behavior During Therapy WFL for tasks assessed/performed                  Past Medical History:  Diagnosis Date   Arthritis    Cataract    Chronic kidney disease    Diverticulosis    Dyslipidemia    GERD (gastroesophageal reflux disease)    HTN (hypertension) 2015   Personal history of colonic adenoma 08/09/2008   Vitamin D deficiency    Past Surgical History:  Procedure Laterality Date   ABDOMINAL HYSTERECTOMY  1975   APPENDECTOMY  1975   COLONOSCOPY     HAND SURGERY     Cyst removal   THYROID SURGERY  2000   to remove nodule   TOTAL KNEE ARTHROPLASTY Right 12/23/2022   Procedure: RIGHT TOTAL KNEE ARTHROPLASTY;  Surgeon: Tarry Kos, MD;  Location: [facility] OR;  Service: Orthopedics;  Laterality: Right;   Patient Active Problem List   Diagnosis Date Noted   Status post total right knee replacement 12/23/2022   Primary osteoarthritis of right knee 12/22/2022   History of GI bleed 06/06/2022   Osteopenia 08/30/2021   Aortic atherosclerosis (HCC) 07/12/2021   Diverticulosis 07/12/2021   Leukocytosis 07/05/2021   GAD (generalized anxiety disorder) 07/05/2021   Personal history of calcium pyrophosphate deposition disease (CPPD) 04/11/2020   History of vitamin D deficiency 07/26/2019   Age-related cataract of both eyes 05/19/2018   Hyperlipidemia 05/19/2018   CKD (chronic kidney disease) stage 3, GFR 30-59 ml/min (HCC) 02/14/2018   Essential hypertension 05/12/2017   Transient  cerebral ischemia 05/12/2017   Seasonal allergic rhinitis due to pollen 12/29/2014   GERD (gastroesophageal reflux disease) 01/17/2014   Personal history of colonic adenoma 08/09/2008    PCP:  Ronnald Nian, MD   REFERRING PROVIDER: Tarry Kos, MD   REFERRING DIAG: -10-CM) - Primary osteoarthritis of right knee Z96.651 (ICD-10-CM) - Status post total right knee replacement  THERAPY DIAG:  Acute pain of right knee  Stiffness of right knee, not elsewhere classified  Difficulty in walking, not elsewhere classified  Localized edema  Muscle weakness (generalized)  Rationale for Evaluation and Treatment: Rehabilitation  ONSET DATE: 12/23/22  SUBJECTIVE:   SUBJECTIVE STATEMENT: Pt arriving with 1-2/10 pain in her Rt knee. Pt still with noted swelling and stiffness in her Rt knee.   PERTINENT HISTORY: Rt TKA on 12/23/22.OA, cataract, CKD, diverticulosis, dyslipidemia, HTN, GERD.  PAIN:  NPRS scale: 1-2/10  Pain location: R knee, BLEs in general  Pain description: soreness  Aggravating factors: being on knee too much, doing too much at one time  Relieving factors: ice, pain meds, movement   PRECAUTIONS: None  WEIGHT BEARING RESTRICTIONS: No  FALLS:  Has patient fallen in last 6 months? No  LIVING ENVIRONMENT: Lives with: alone Lives in: House/apartment Stairs: Yes: External: 1 steps; none Has following equipment at home: Dan Humphreys - 2  wheeled  OCCUPATION: retired  PLOF: Independent  PATIENT GOALS: Walk without pain  Next MD visit: no follow up scheduled at this time  OBJECTIVE:   DIAGNOSTIC FINDINGS:  IMPRESSION: Postsurgical changes from right knee arthroplasty including subcutaneous and intra-articular air, as well as a joint effusion.  PATIENT SURVEYS:   02/06/2023: FOTO 57 (Goal met)  01/07/23: FOTO intake:  40% , predicted 57%  COGNITION: Overall cognitive status: WFL    SENSATION: WFL  EDEMA:  01/07/23: Circumferential: Rt: 45 centimeters ,  Left: 42 centimeters   POSTURE: rounded shoulders, forward head, and decreased lumbar lordosis  PALPATION: 01/07/23: Pt with noted pain with palpation around med/lateral joint line of Rt knee, noted tenderness in distal hamstring tendons  LOWER EXTREMITY ROM:   ROM Right 01/07/23 Left 01/07/23 Right 01/10/23 Right  01/15/23 Rt 01/20/23 Rt 01/22/23 Rt 01/31/23 Rt 02/04/23 Right 02/06/2023 Rt 02/11/23 Rt 02/28/2023 Rt 03/04/23   Hip flexion              Hip abduction              Hip adduction              Knee flexion 80 110 Passive 98 Supine AA 100*  Sitting P: 100 Supine A: 95 P: 100 105 active A: 104 P: 110 Active 104  A:105 105 A: 105  Knee extension   -10    -4 active A: -4  P: -2 Active -2 A: -2 -1 A: -2 P: 0  Ankle dorsiflexion              Ankle plantarflexion               (Blank rows = not tested)  LOWER EXTREMITY MMT:  MMT Right 01/07/23 Left 01/07/23 Left/Right 02/06/2023 in pounds Rt 02/11/23  Hip flexion 4 5    Hip extension      Hip abduction      Hip adduction      Knee flexion 3 5    Knee extension 3 5 39.0/36.8 4   Ankle dorsiflexion      Ankle plantarflexion       (Blank rows = not tested)    FUNCTIONAL TESTS:  01/07/23: 5 time sit to stand: 24 sec c UE support  GAIT: Distance walked: 20 feet  Assistive device utilized: Walker - 2 wheeled Level of assistance: Modified independence Comments: decreased step length, decreased wt shift to Rt    TODAY'S TREATMENT                                                                          DATE: 02/25/2023: TherEx Nustep: level 6 x 8 minutes, seat 8 UE/LE Mini squats with UE support Lateral step ups 6 inch step:  x 15 c UE support leading with Rt LE Leg Press: 75# bil LE's 2 x 10, Rt LE only 37# 2 x 10  NMR:  Side stepping x 4 laps in parallel bars c UE support Tandem stance 30 sec c left foot front, 15 sec c Rt foot front Vectors: bil LEs toward 3 cones placed: ant/lat, lat, and post/lat x 10 each  LE Manual; Knee flexion PROM Modalities:  Vasopneumatic: 34  deg, x 10 minutes medium compression      02/28/2023 Tailgate knee flexion 1 minute Quadriceps sets 10 x 10 seconds Seated knee flexion AAROM (left pushes right into flexion) 10 x 10 seconds  Neuromuscular re-education: Tandem balance 2 x 20 seconds bilateral Dynamic balance and gait drills (narrow gait, minimal lateral lean)  Functional Activities: Step-up and over 6 inch step (avoid hip ER) 20 x slow eccentrics without hand rails Lateral step-downs off 4 inch step (use hand rail) 10 x slow eccentrics   02/25/2023: TherEx Nustep: level 6 x 8 minutes, seat 8 UE/LE Step ups 6 inch  x 15 leading with Rt and left LE c UE support Lateral step ups: x 15 c UE support leading with Rt LE TKE: 2 x 10 green TB  Calf stretch: x 2 holding 30 seconds Leg Press: 75# bil LE's 2 x 10, Rt LE only 37# 2 x 10  TRX: squats x 10 NMR:  Side stepping x 4 laps in parallel bars c UE support Tandem walking on beam x 6 trial with intermittent UE support Side stepping on Beam x 4  Stepping over 6 inch hurdles Modalities:  Vasopneumatic, medium compression, 34 degress x 10 minutes   02/18/2023: TherEx Nustep: level 6 x 6 minutes Step ups 6 inch  x 15 leading with Rt and left LE c UE support Calf stretch: x 2 holding 30 seconds Leg Press: 75# bil LE's 2 x 10, Rt LE 31# x 10 Leg curl machine: Rt LE only 15#  x 10 Leg extension machine: bil LE's lifting, Rt lowering, 10# 2 x 10  Gait Amb on community surfaces out side clinic building, up and down small ramp, over curb step, walking up and down inclines, walking in grassy areas and mulch bed c CGA via gait belt and no device Navigation down clinic lobby stairs with single hand rail. Pt having difficulty with reciprocal gait pattern, leading more with  her Rt LE. Performed with CGA via gait belt   PATIENT EDUCATION:  Education details: HEP, POC Person educated: Patient Education method:  Explanation, Demonstration, Verbal cues, and Handouts Education comprehension: verbalized understanding, returned demonstration, and verbal cues required  HOME EXERCISE PROGRAM: Access Code: [email or web address] URL: [email or web address] Date: 01/07/2023 Prepared by: Narda Amber  Exercises - Supine Quadricep Sets  - 2-3 x daily - 7 x weekly - 15 reps - Supine Active Straight Leg Raise  - 2-3 x daily - 7 x weekly - 15 reps - Supine Heel Slides  - 2-3 x daily - 7 x weekly - 15 reps - Seated Long Arc Quad  - 2-3 x daily - 7 x weekly - 15 reps - Sit to Stand with Counter Support  - 2-3 x daily - 7 x weekly - 10 reps  ASSESSMENT  CLINICAL IMPRESSION: Pt's AROM measured -2 to 105 degrees today. Pt progressing nicely with dynamic balance and functional mobility. Pt still reporting pain with ADL's and walking with daily swelling with increased activities. Continuing with skilled PT to maximize pt's function.   OBJECTIVE IMPAIRMENTS: decreased activity tolerance, decreased balance, decreased mobility, difficulty walking, decreased ROM, decreased strength, increased edema, impaired flexibility, and pain.   ACTIVITY LIMITATIONS: sitting, standing, squatting, sleeping, stairs, and transfers  PARTICIPATION LIMITATIONS: meal prep, cleaning, laundry, and community activity  PERSONAL FACTORS: Age and 3+ comorbidities: see above  are also affecting patient's functional outcome.   REHAB POTENTIAL: Good  CLINICAL DECISION MAKING: Stable/uncomplicated  EVALUATION COMPLEXITY: Low  GOALS: Goals reviewed  with patient? Yes  SHORT TERM GOALS: (target date for Short term goals are 3 weeks 01/31/23)   1.  Patient will demonstrate independent use of home exercise program to maintain progress from in clinic treatments.  Goal status: MET 01/22/23  LONG TERM GOALS: (target dates for all long term goals are 12 weeks  04/04/23 )   1. Patient will demonstrate/report pain at worst less than or equal to  2/10 to facilitate minimal limitation in daily activity secondary to pain symptoms.  Goal status:On-going: 02/28/23   2. Patient will demonstrate independent use of home exercise program to facilitate ability to maintain/progress functional gains from skilled physical therapy services.  Goal status: On Going 02/28/2023   3. Patient will demonstrate FOTO outcome > or = 57 % to indicate reduced disability due to condition.  Goal status: Met 02/06/2023   4.  Patient will demonstrate Rt LE MMT >/= 4/5 throughout to faciltiate usual transfers, stairs, squatting at [facility] for daily life.   Goal status:on-going 02/11/23   5.  Patient will demonstrate improved sit to stand to </= 14 seconds with no UE support.   Goal status: MET 02/18/23   6.  Pt will be able to amb in community around neighborhood for >/= 20 minutes safely.    Goal status: Partially Met  02/28/23  7. Pt will be able to navigate up and down on curb step with no device safely.    Goal status: MET 02/11/23    PLAN:  PT FREQUENCY: 1-2x/week  PT DURATION: 4-6 weeks  PLANNED INTERVENTIONS: Therapeutic exercises, Therapeutic activity, Neuro Muscular re-education, Balance training, Gait training, Patient/Family education, Joint mobilization, Stair training, DME instructions, Dry Needling, Electrical stimulation, Traction, Cryotherapy, vasopneumatic deviceMoist heat, Taping, Ultrasound, Ionotophoresis 4mg /ml Dexamethasone, and aquatic therapy, Manual therapy.  All included unless contraindicated  PLAN FOR NEXT SESSION: Continue quadriceps and hip abductors strength, knee flexion AROM and functional (stair) work   Narda Amber, PT, MPT 03/04/23 2:51 PM   03/04/23 2:51 PM

## 2023-03-06 ENCOUNTER — Ambulatory Visit: Payer: Medicare HMO | Admitting: Rehabilitative and Restorative Service Providers"

## 2023-03-06 ENCOUNTER — Encounter: Payer: Self-pay | Admitting: Rehabilitative and Restorative Service Providers"

## 2023-03-06 DIAGNOSIS — M6281 Muscle weakness (generalized): Secondary | ICD-10-CM | POA: Diagnosis not present

## 2023-03-06 DIAGNOSIS — M25561 Pain in right knee: Secondary | ICD-10-CM

## 2023-03-06 DIAGNOSIS — M25661 Stiffness of right knee, not elsewhere classified: Secondary | ICD-10-CM

## 2023-03-06 DIAGNOSIS — R600 Localized edema: Secondary | ICD-10-CM | POA: Diagnosis not present

## 2023-03-06 DIAGNOSIS — R262 Difficulty in walking, not elsewhere classified: Secondary | ICD-10-CM | POA: Diagnosis not present

## 2023-03-06 NOTE — Therapy (Signed)
OUTPATIENT PHYSICAL THERAPY LOWER EXTREMITY  TREATMENT   Patient Name: Cristina Sawyer MRN: [id number] DOB:April 20, 1941, 82 y.o., female Today's Date: 03/06/2023  END OF SESSION:  PT End of Session - 03/06/23 1503     Visit Number 17    Number of Visits 24    Date for PT Re-Evaluation 04/04/23    Authorization - Visit Number 17    Progress Note Due on Visit 20    PT Start Time 1430    PT Stop Time 1510    PT Time Calculation (min) 40 min    Activity Tolerance Patient tolerated treatment well;No increased pain    Behavior During Therapy WFL for tasks assessed/performed               Past Medical History:  Diagnosis Date   Arthritis    Cataract    Chronic kidney disease    Diverticulosis    Dyslipidemia    GERD (gastroesophageal reflux disease)    HTN (hypertension) 2015   Personal history of colonic adenoma 08/09/2008   Vitamin D deficiency    Past Surgical History:  Procedure Laterality Date   ABDOMINAL HYSTERECTOMY  1975   APPENDECTOMY  1975   COLONOSCOPY     HAND SURGERY     Cyst removal   THYROID SURGERY  2000   to remove nodule   TOTAL KNEE ARTHROPLASTY Right 12/23/2022   Procedure: RIGHT TOTAL KNEE ARTHROPLASTY;  Surgeon: Tarry Kos, MD;  Location: [facility] OR;  Service: Orthopedics;  Laterality: Right;   Patient Active Problem List   Diagnosis Date Noted   Status post total right knee replacement 12/23/2022   Primary osteoarthritis of right knee 12/22/2022   History of GI bleed 06/06/2022   Osteopenia 08/30/2021   Aortic atherosclerosis (HCC) 07/12/2021   Diverticulosis 07/12/2021   Leukocytosis 07/05/2021   GAD (generalized anxiety disorder) 07/05/2021   Personal history of calcium pyrophosphate deposition disease (CPPD) 04/11/2020   History of vitamin D deficiency 07/26/2019   Age-related cataract of both eyes 05/19/2018   Hyperlipidemia 05/19/2018   CKD (chronic kidney disease) stage 3, GFR 30-59 ml/min (HCC) 02/14/2018   Essential hypertension  05/12/2017   Transient cerebral ischemia 05/12/2017   Seasonal allergic rhinitis due to pollen 12/29/2014   GERD (gastroesophageal reflux disease) 01/17/2014   Personal history of colonic adenoma 08/09/2008    PCP:  Ronnald Nian, MD   REFERRING PROVIDER: Tarry Kos, MD   REFERRING DIAG: -10-CM) - Primary osteoarthritis of right knee Z96.651 (ICD-10-CM) - Status post total right knee replacement  THERAPY DIAG:  Acute pain of right knee  Stiffness of right knee, not elsewhere classified  Difficulty in walking, not elsewhere classified  Localized edema  Muscle weakness (generalized)  Rationale for Evaluation and Treatment: Rehabilitation  ONSET DATE: 12/23/22  SUBJECTIVE:   SUBJECTIVE STATEMENT: Cristina Sawyer reports overall progress with her supervised PT.  PERTINENT HISTORY: Rt TKA on 12/23/22.OA, cataract, CKD, diverticulosis, dyslipidemia, HTN, GERD.  PAIN:  NPRS scale: 1-5/10 this week (1x oxycodone before PT) Pain location: R knee, BLEs in general  Pain description: soreness  Aggravating factors: being on knee too much, doing too much at one time  Relieving factors: ice, pain meds, movement   PRECAUTIONS: None  WEIGHT BEARING RESTRICTIONS: No  FALLS:  Has patient fallen in last 6 months? No  LIVING ENVIRONMENT: Lives with: alone Lives in: House/apartment Stairs: Yes: External: 1 steps; none Has following equipment at home: Dan Humphreys - 2 wheeled  OCCUPATION: retired  PLOF: Independent  PATIENT GOALS: Walk without pain  Next MD visit: no follow up scheduled at this time  OBJECTIVE:   DIAGNOSTIC FINDINGS:  IMPRESSION: Postsurgical changes from right knee arthroplasty including subcutaneous and intra-articular air, as well as a joint effusion.  PATIENT SURVEYS:  03/06/2023: FOTO 76  02/06/2023: FOTO 57 (Goal met)  01/07/23: Janyth Contes intake:  40% , predicted 57%  COGNITION: Overall cognitive status: WFL    SENSATION: WFL  EDEMA:  01/07/23:  Circumferential: Rt: 45 centimeters , Left: 42 centimeters   POSTURE: rounded shoulders, forward head, and decreased lumbar lordosis  PALPATION: 01/07/23: Pt with noted pain with palpation around med/lateral joint line of Rt knee, noted tenderness in distal hamstring tendons  LOWER EXTREMITY ROM:   ROM Right 01/07/23 Left 01/07/23 Right 01/10/23 Right  01/15/23 Rt 01/20/23 Rt 01/22/23 Rt 01/31/23 Rt 02/04/23 Right 02/06/2023 Rt 02/11/23 Rt 02/28/2023 Rt 03/04/23   Hip flexion              Hip abduction              Hip adduction              Knee flexion 80 110 Passive 98 Supine AA 100*  Sitting P: 100 Supine A: 95 P: 100 105 active A: 104 P: 110 Active 104  A:105 105 A: 105  Knee extension   -10    -4 active A: -4  P: -2 Active -2 A: -2 -1 A: -2 P: 0  Ankle dorsiflexion              Ankle plantarflexion               (Blank rows = not tested)  LOWER EXTREMITY MMT:  MMT Right 01/07/23 Left 01/07/23 Left/Right 02/06/2023 in pounds Rt 02/11/23  Hip flexion 4 5    Hip extension      Hip abduction      Hip adduction      Knee flexion 3 5    Knee extension 3 5 39.0/36.8 4   Ankle dorsiflexion      Ankle plantarflexion       (Blank rows = not tested)    FUNCTIONAL TESTS:  01/07/23: 5 time sit to stand: 24 sec c UE support  GAIT: Distance walked: 20 feet  Assistive device utilized: Walker - 2 wheeled Level of assistance: Modified independence Comments: decreased step length, decreased wt shift to Rt    TODAY'S TREATMENT                                                                          DATE: 03/06/2023: Tailgate knee flexion 1 minute Quadriceps sets 10 x 10 seconds Seated knee flexion AAROM (left pushes right into flexion) 10 x 10 seconds Seated straight leg raises 3 sets of 5 for 3 seconds  Neuromuscular re-education: Tandem balance 2 x 20 seconds bilateral Dynamic balance on foam and gait drills (narrow gait, minimal lateral lean)  Functional  Activities: Step-up and over 8 inch step (avoid hip ER) 20 x slow eccentrics without hand rails Lateral step-downs off 4 inch step (use hand rail) 2 sets of 10 x slow eccentrics   02/25/2023: TherEx Nustep: level 6  x 8 minutes, seat 8 UE/LE Mini squats with UE support Lateral step ups 6 inch step:  x 15 c UE support leading with Rt LE Leg Press: 75# bil LE's 2 x 10, Rt LE only 37# 2 x 10  NMR:  Side stepping x 4 laps in parallel bars c UE support Tandem stance 30 sec c left foot front, 15 sec c Rt foot front Vectors: bil LEs toward 3 cones placed: ant/lat, lat, and post/lat x 10 each LE Manual; Knee flexion PROM Modalities:  Vasopneumatic: 34 deg, x 10 minutes medium compression    02/28/2023 Tailgate knee flexion 1 minute Quadriceps sets 10 x 10 seconds Seated knee flexion AAROM (left pushes right into flexion) 10 x 10 seconds  Neuromuscular re-education: Tandem balance 2 x 20 seconds bilateral Dynamic balance and gait drills (narrow gait, minimal lateral lean)  Functional Activities: Step-up and over 6 inch step (avoid hip ER) 20 x slow eccentrics without hand rails Lateral step-downs off 4 inch step (use hand rail) 10 x slow eccentrics   PATIENT EDUCATION:  Education details: HEP, POC Person educated: Patient Education method: Programmer, multimedia, Demonstration, Verbal cues, and Handouts Education comprehension: verbalized understanding, returned demonstration, and verbal cues required  HOME EXERCISE PROGRAM: Access Code: [email or web address] URL: [email or web address] Date: 01/07/2023 Prepared by: Narda Amber  Exercises - Supine Quadricep Sets  - 2-3 x daily - 7 x weekly - 15 reps - Supine Active Straight Leg Raise  - 2-3 x daily - 7 x weekly - 15 reps - Supine Heel Slides  - 2-3 x daily - 7 x weekly - 15 reps - Seated Long Arc Quad  - 2-3 x daily - 7 x weekly - 15 reps - Sit to Stand with Counter Support  - 2-3 x daily - 7 x weekly - 10  reps  ASSESSMENT  CLINICAL IMPRESSION: Cristina Sawyer is getting more comfortable with the idea of transferring into independent rehabilitation.  She took 1 oxycodone this week preventatively but otherwise has had little pain.  [facility]ner knows that quadriceps strengthening will be a focus of her independent rehabilitation.  If she feels ready, she may be ready for independent rehabilitation next week.  OBJECTIVE IMPAIRMENTS: decreased activity tolerance, decreased balance, decreased mobility, difficulty walking, decreased ROM, decreased strength, increased edema, impaired flexibility, and pain.   ACTIVITY LIMITATIONS: sitting, standing, squatting, sleeping, stairs, and transfers  PARTICIPATION LIMITATIONS: meal prep, cleaning, laundry, and community activity  PERSONAL FACTORS: Age and 3+ comorbidities: see above  are also affecting patient's functional outcome.   REHAB POTENTIAL: Good  CLINICAL DECISION MAKING: Stable/uncomplicated  EVALUATION COMPLEXITY: Low  GOALS: Goals reviewed with patient? Yes  SHORT TERM GOALS: (target date for Short term goals are 3 weeks 01/31/23)   1.  Patient will demonstrate independent use of home exercise program to maintain progress from in clinic treatments.  Goal status: MET 01/22/23  LONG TERM GOALS: (target dates for all long term goals are 12 weeks  04/04/23 )   1. Patient will demonstrate/report pain at worst less than or equal to 2/10 to facilitate minimal limitation in daily activity secondary to pain symptoms.  Goal status:On-going: 03/06/23   2. Patient will demonstrate independent use of home exercise program to facilitate ability to maintain/progress functional gains from skilled physical therapy services.  Goal status: Met 03/06/2023   3. Patient will demonstrate FOTO outcome > or = 57 % to indicate reduced disability due to condition.  Goal status: Met 03/06/2023   4.  Patient will demonstrate  Rt LE MMT >/= 4/5 throughout to faciltiate usual  transfers, stairs, squatting at [facility] for daily life.   Goal status:on-going 03/06/23   5.  Patient will demonstrate improved sit to stand to </= 14 seconds with no UE support.   Goal status: MET 02/18/23   6.  Pt will be able to amb in community around neighborhood for >/= 20 minutes safely.    Goal status: Partially Met  02/28/23  7. Pt will be able to navigate up and down on curb step with no device safely.    Goal status: MET 02/11/23    PLAN:  PT FREQUENCY: 1-2x/week  PT DURATION: 1-2 weeks  PLANNED INTERVENTIONS: Therapeutic exercises, Therapeutic activity, Neuro Muscular re-education, Balance training, Gait training, Patient/Family education, Joint mobilization, Stair training, DME instructions, Dry Needling, Electrical stimulation, Traction, Cryotherapy, vasopneumatic deviceMoist heat, Taping, Ultrasound, Ionotophoresis 4mg /ml Dexamethasone, and aquatic therapy, Manual therapy.  All included unless contraindicated  PLAN FOR NEXT SESSION: Consider DC if she is comfortable with her current HEP.  Cherlyn Cushing PT, MPT 03/06/23 3:17 PM   03/06/23 3:17 PM

## 2023-03-11 ENCOUNTER — Ambulatory Visit: Payer: Medicare HMO | Admitting: Physical Therapy

## 2023-03-11 ENCOUNTER — Encounter: Payer: Self-pay | Admitting: Physical Therapy

## 2023-03-11 DIAGNOSIS — M6281 Muscle weakness (generalized): Secondary | ICD-10-CM | POA: Diagnosis not present

## 2023-03-11 DIAGNOSIS — M25561 Pain in right knee: Secondary | ICD-10-CM | POA: Diagnosis not present

## 2023-03-11 DIAGNOSIS — R600 Localized edema: Secondary | ICD-10-CM | POA: Diagnosis not present

## 2023-03-11 DIAGNOSIS — M25661 Stiffness of right knee, not elsewhere classified: Secondary | ICD-10-CM | POA: Diagnosis not present

## 2023-03-11 DIAGNOSIS — R262 Difficulty in walking, not elsewhere classified: Secondary | ICD-10-CM | POA: Diagnosis not present

## 2023-03-11 NOTE — Therapy (Signed)
OUTPATIENT PHYSICAL THERAPY LOWER EXTREMITY  TREATMENT   Patient Name: Cristina Sawyer MRN: [id number] DOB:November 05, 1940, 82 y.o., female Today's Date: 03/11/2023  END OF SESSION:  PT End of Session - 03/11/23 1411     Visit Number 18    Number of Visits 24    Date for PT Re-Evaluation 04/04/23    Authorization - Visit Number 18    Progress Note Due on Visit 20    PT Start Time 1345    PT Stop Time 1430    PT Time Calculation (min) 45 min    Activity Tolerance Patient tolerated treatment well;No increased pain    Behavior During Therapy WFL for tasks assessed/performed               Past Medical History:  Diagnosis Date   Arthritis    Cataract    Chronic kidney disease    Diverticulosis    Dyslipidemia    GERD (gastroesophageal reflux disease)    HTN (hypertension) 2015   Personal history of colonic adenoma 08/09/2008   Vitamin D deficiency    Past Surgical History:  Procedure Laterality Date   ABDOMINAL HYSTERECTOMY  1975   APPENDECTOMY  1975   COLONOSCOPY     HAND SURGERY     Cyst removal   THYROID SURGERY  2000   to remove nodule   TOTAL KNEE ARTHROPLASTY Right 12/23/2022   Procedure: RIGHT TOTAL KNEE ARTHROPLASTY;  Surgeon: Tarry Kos, MD;  Location: [facility] OR;  Service: Orthopedics;  Laterality: Right;   Patient Active Problem List   Diagnosis Date Noted   Status post total right knee replacement 12/23/2022   Primary osteoarthritis of right knee 12/22/2022   History of GI bleed 06/06/2022   Osteopenia 08/30/2021   Aortic atherosclerosis (HCC) 07/12/2021   Diverticulosis 07/12/2021   Leukocytosis 07/05/2021   GAD (generalized anxiety disorder) 07/05/2021   Personal history of calcium pyrophosphate deposition disease (CPPD) 04/11/2020   History of vitamin D deficiency 07/26/2019   Age-related cataract of both eyes 05/19/2018   Hyperlipidemia 05/19/2018   CKD (chronic kidney disease) stage 3, GFR 30-59 ml/min (HCC) 02/14/2018   Essential hypertension  05/12/2017   Transient cerebral ischemia 05/12/2017   Seasonal allergic rhinitis due to pollen 12/29/2014   GERD (gastroesophageal reflux disease) 01/17/2014   Personal history of colonic adenoma 08/09/2008    PCP:  Ronnald Nian, MD   REFERRING PROVIDER: Tarry Kos, MD   REFERRING DIAG: -10-CM) - Primary osteoarthritis of right knee Z96.651 (ICD-10-CM) - Status post total right knee replacement  THERAPY DIAG:  Acute pain of right knee  Stiffness of right knee, not elsewhere classified  Difficulty in walking, not elsewhere classified  Localized edema  Muscle weakness (generalized)  Rationale for Evaluation and Treatment: Rehabilitation  ONSET DATE: 12/23/22  SUBJECTIVE:   SUBJECTIVE STATEMENT: Pt reporting she is ready for discharge at her next visit.   PERTINENT HISTORY: Rt TKA on 12/23/22.OA, cataract, CKD, diverticulosis, dyslipidemia, HTN, GERD.  PAIN:  NPRS scale: 2/10, pt still taking pain meds prior to PT Pain location: R knee, BLEs in general  Pain description: soreness  Aggravating factors: being on knee too much, doing too much at one time  Relieving factors: ice, pain meds, movement   PRECAUTIONS: None  WEIGHT BEARING RESTRICTIONS: No  FALLS:  Has patient fallen in last 6 months? No  LIVING ENVIRONMENT: Lives with: alone Lives in: House/apartment Stairs: Yes: External: 1 steps; none Has following equipment at home: Dan Humphreys -  2 wheeled  OCCUPATION: retired  PLOF: Independent  PATIENT GOALS: Walk without pain  Next MD visit: no follow up scheduled at this time  OBJECTIVE:   DIAGNOSTIC FINDINGS:  IMPRESSION: Postsurgical changes from right knee arthroplasty including subcutaneous and intra-articular air, as well as a joint effusion.  PATIENT SURVEYS:  03/06/2023: FOTO 76  02/06/2023: FOTO 57 (Goal met)  01/07/23: Janyth Contes intake:  40% , predicted 57%  COGNITION: Overall cognitive status: WFL    SENSATION: WFL  EDEMA:  01/07/23:  Circumferential: Rt: 45 centimeters , Left: 42 centimeters   POSTURE: rounded shoulders, forward head, and decreased lumbar lordosis  PALPATION: 01/07/23: Pt with noted pain with palpation around med/lateral joint line of Rt knee, noted tenderness in distal hamstring tendons  LOWER EXTREMITY ROM:   ROM Right 01/07/23 Left 01/07/23 Right 01/10/23 Right  01/15/23 Rt 01/20/23 Rt 01/22/23 Rt 01/31/23 Rt 02/04/23 Right 02/06/2023 Rt 02/11/23 Rt 02/28/2023 Rt 03/04/23   Hip flexion              Hip abduction              Hip adduction              Knee flexion 80 110 Passive 98 Supine AA 100*  Sitting P: 100 Supine A: 95 P: 100 105 active A: 104 P: 110 Active 104  A:105 105 A: 105  Knee extension   -10    -4 active A: -4  P: -2 Active -2 A: -2 -1 A: -2 P: 0  Ankle dorsiflexion              Ankle plantarflexion               (Blank rows = not tested)  LOWER EXTREMITY MMT:  MMT Right 01/07/23 Left 01/07/23 Left/Right 02/06/2023 in pounds Rt 02/11/23 Rt 03/11/23  Hip flexion 4 5   5   Hip extension       Hip abduction       Hip adduction       Knee flexion 3 5   5   Knee extension 3 5 39.0/36.8 4  5   Ankle dorsiflexion       Ankle plantarflexion        (Blank rows = not tested)    FUNCTIONAL TESTS:  01/07/23: 5 time sit to stand: 24 sec c UE support  GAIT: Distance walked: 20 feet  Assistive device utilized: Walker - 2 wheeled Level of assistance: Modified independence Comments: decreased step length, decreased wt shift to Rt    TODAY'S TREATMENT                                                                          DATE: 03/11/2023: TherEx Recumbent bike; level 4, x 6 minutes Calf stretch: x 2 holding 30 sec Forward Step downs off 8 inch step x 10, Step ups on 8 inch step x 15  Leg Press: 75# bil LE's 2 x 10, Rt LE only 50# 2 x 10   NMR:  SLS on left with Rt foot on soccer ball rolling it in circles, x 1 minute, repeated on left Agility ladder: side stepping, diagonals  out/in each x 4   Modalities:  Vasopneumatic: 34 deg, x 10 minutes medium compression      03/06/2023: Tailgate knee flexion 1 minute Quadriceps sets 10 x 10 seconds Seated knee flexion AAROM (left pushes right into flexion) 10 x 10 seconds Seated straight leg raises 3 sets of 5 for 3 seconds  Neuromuscular re-education: Tandem balance 2 x 20 seconds bilateral Dynamic balance on foam and gait drills (narrow gait, minimal lateral lean)  Functional Activities: Step-up and over 8 inch step (avoid hip ER) 20 x slow eccentrics without hand rails Lateral step-downs off 4 inch step (use hand rail) 2 sets of 10 x slow eccentrics   02/25/2023: TherEx Nustep: level 6 x 8 minutes, seat 8 UE/LE Mini squats with UE support Lateral step ups 6 inch step:  x 15 c UE support leading with Rt LE Leg Press: 75# bil LE's 2 x 10, Rt LE only 37# 2 x 10  NMR:  Side stepping x 4 laps in parallel bars c UE support Tandem stance 30 sec c left foot front, 15 sec c Rt foot front Vectors: bil LEs toward 3 cones placed: ant/lat, lat, and post/lat x 10 each LE Manual; Knee flexion PROM Modalities:  Vasopneumatic: 34 deg, x 10 minutes medium compression      PATIENT EDUCATION:  Education details: HEP, POC Person educated: Patient Education method: Programmer, multimedia, Demonstration, Verbal cues, and Handouts Education comprehension: verbalized understanding, returned demonstration, and verbal cues required  HOME EXERCISE PROGRAM: Access Code: [email or web address] URL: [email or web address] Date: 01/07/2023 Prepared by: Narda Amber  Exercises - Supine Quadricep Sets  - 2-3 x daily - 7 x weekly - 15 reps - Supine Active Straight Leg Raise  - 2-3 x daily - 7 x weekly - 15 reps - Supine Heel Slides  - 2-3 x daily - 7 x weekly - 15 reps - Seated Long Arc Quad  - 2-3 x daily - 7 x weekly - 15 reps - Sit to Stand with Counter Support  - 2-3 x daily - 7 x weekly - 10 reps  ASSESSMENT  CLINICAL  IMPRESSION: Pt tolerating all exercises well. Pt reported today she has started driving. Pt has made great progress since beginning therapy. We discussed discharge at her next visit. Pt will need finalization of her HEP.   OBJECTIVE IMPAIRMENTS: decreased activity tolerance, decreased balance, decreased mobility, difficulty walking, decreased ROM, decreased strength, increased edema, impaired flexibility, and pain.   ACTIVITY LIMITATIONS: sitting, standing, squatting, sleeping, stairs, and transfers  PARTICIPATION LIMITATIONS: meal prep, cleaning, laundry, and community activity  PERSONAL FACTORS: Age and 3+ comorbidities: see above  are also affecting patient's functional outcome.   REHAB POTENTIAL: Good  CLINICAL DECISION MAKING: Stable/uncomplicated  EVALUATION COMPLEXITY: Low  GOALS: Goals reviewed with patient? Yes  SHORT TERM GOALS: (target date for Short term goals are 3 weeks 01/31/23)   1.  Patient will demonstrate independent use of home exercise program to maintain progress from in clinic treatments.  Goal status: MET 01/22/23  LONG TERM GOALS: (target dates for all long term goals are 12 weeks  04/04/23 )   1. Patient will demonstrate/report pain at worst less than or equal to 2/10 to facilitate minimal limitation in daily activity secondary to pain symptoms.  Goal status:On-going: 03/06/23   2. Patient will demonstrate independent use of home exercise program to facilitate ability to maintain/progress functional gains from skilled physical therapy services.  Goal status: Met 03/06/2023   3. Patient will demonstrate FOTO outcome > or = 57 % to indicate reduced  disability due to condition.  Goal status: Met 03/06/2023   4.  Patient will demonstrate Rt LE MMT >/= 4/5 throughout to faciltiate usual transfers, stairs, squatting at [facility] for daily life.   Goal status:on-going 03/06/23   5.  Patient will demonstrate improved sit to stand to </= 14 seconds with no UE support.    Goal status: MET 02/18/23   6.  Pt will be able to amb in community around neighborhood for >/= 20 minutes safely.    Goal status: Partially Met  02/28/23  7. Pt will be able to navigate up and down on curb step with no device safely.    Goal status: MET 02/11/23    PLAN:  PT FREQUENCY: 1-2x/week  PT DURATION: 1-2 weeks  PLANNED INTERVENTIONS: Therapeutic exercises, Therapeutic activity, Neuro Muscular re-education, Balance training, Gait training, Patient/Family education, Joint mobilization, Stair training, DME instructions, Dry Needling, Electrical stimulation, Traction, Cryotherapy, vasopneumatic deviceMoist heat, Taping, Ultrasound, Ionotophoresis 4mg /ml Dexamethasone, and aquatic therapy, Manual therapy.  All included unless contraindicated  PLAN FOR NEXT SESSION: Discharge next visit after updating HEP  Narda Amber, PT, MPT 03/11/23 2:43 PM   03/11/23 2:43 PM   03/11/23 2:43 PM

## 2023-03-13 ENCOUNTER — Encounter: Payer: Self-pay | Admitting: Rehabilitative and Restorative Service Providers"

## 2023-03-13 ENCOUNTER — Ambulatory Visit: Payer: Medicare HMO | Admitting: Rehabilitative and Restorative Service Providers"

## 2023-03-13 DIAGNOSIS — M25661 Stiffness of right knee, not elsewhere classified: Secondary | ICD-10-CM | POA: Diagnosis not present

## 2023-03-13 DIAGNOSIS — R600 Localized edema: Secondary | ICD-10-CM | POA: Diagnosis not present

## 2023-03-13 DIAGNOSIS — R262 Difficulty in walking, not elsewhere classified: Secondary | ICD-10-CM | POA: Diagnosis not present

## 2023-03-13 DIAGNOSIS — M25561 Pain in right knee: Secondary | ICD-10-CM

## 2023-03-13 DIAGNOSIS — M6281 Muscle weakness (generalized): Secondary | ICD-10-CM

## 2023-03-13 NOTE — Therapy (Signed)
OUTPATIENT PHYSICAL THERAPY LOWER EXTREMITY  TREATMENT   Patient Name: Cristina Sawyer MRN: [id number] DOB:09-13-1941, 82 y.o., female Today's Date: 03/13/2023  END OF SESSION:  PT End of Session - 03/13/23 1341     Visit Number 19    Number of Visits 24    Date for PT Re-Evaluation 04/04/23    Authorization - Visit Number 19    Progress Note Due on Visit 20    PT Start Time 1340    PT Stop Time 1430    PT Time Calculation (min) 50 min    Activity Tolerance Patient tolerated treatment well;No increased pain    Behavior During Therapy WFL for tasks assessed/performed            PHYSICAL THERAPY DISCHARGE SUMMARY  Visits from Start of Care: 19  Current functional level related to goals / functional outcomes: See note   Remaining deficits: See note   Education / Equipment: Updated HEP   Patient agrees to discharge. Patient goals were met. Patient is being discharged due to being pleased with the current functional level.   Past Medical History:  Diagnosis Date   Arthritis    Cataract    Chronic kidney disease    Diverticulosis    Dyslipidemia    GERD (gastroesophageal reflux disease)    HTN (hypertension) 2015   Personal history of colonic adenoma 08/09/2008   Vitamin D deficiency    Past Surgical History:  Procedure Laterality Date   ABDOMINAL HYSTERECTOMY  1975   APPENDECTOMY  1975   COLONOSCOPY     HAND SURGERY     Cyst removal   THYROID SURGERY  2000   to remove nodule   TOTAL KNEE ARTHROPLASTY Right 12/23/2022   Procedure: RIGHT TOTAL KNEE ARTHROPLASTY;  Surgeon: Tarry Kos, MD;  Location: [facility] OR;  Service: Orthopedics;  Laterality: Right;   Patient Active Problem List   Diagnosis Date Noted   Status post total right knee replacement 12/23/2022   Primary osteoarthritis of right knee 12/22/2022   History of GI bleed 06/06/2022   Osteopenia 08/30/2021   Aortic atherosclerosis (HCC) 07/12/2021   Diverticulosis 07/12/2021   Leukocytosis  07/05/2021   GAD (generalized anxiety disorder) 07/05/2021   Personal history of calcium pyrophosphate deposition disease (CPPD) 04/11/2020   History of vitamin D deficiency 07/26/2019   Age-related cataract of both eyes 05/19/2018   Hyperlipidemia 05/19/2018   CKD (chronic kidney disease) stage 3, GFR 30-59 ml/min (HCC) 02/14/2018   Essential hypertension 05/12/2017   Transient cerebral ischemia 05/12/2017   Seasonal allergic rhinitis due to pollen 12/29/2014   GERD (gastroesophageal reflux disease) 01/17/2014   Personal history of colonic adenoma 08/09/2008    PCP:  Ronnald Nian, MD   REFERRING PROVIDER: Tarry Kos, MD   REFERRING DIAG: -10-CM) - Primary osteoarthritis of right knee Z96.651 (ICD-10-CM) - Status post total right knee replacement  THERAPY DIAG:  Difficulty in walking, not elsewhere classified  Acute pain of right knee  Stiffness of right knee, not elsewhere classified  Localized edema  Muscle weakness (generalized)  Rationale for Evaluation and Treatment: Rehabilitation  ONSET DATE: 12/23/22  SUBJECTIVE:   SUBJECTIVE STATEMENT: Cristina Sawyer reports she is consistently getting 5-6 hours of sleep.  She reports no concerns in regards to her right knee, independence with her home exercise program which is addressing remaining impairments and confidence to continue this program independently.  PERTINENT HISTORY: Rt TKA on 12/23/22.OA, cataract, CKD, diverticulosis, dyslipidemia, HTN, GERD.  PAIN:  NPRS scale: 0-4/10 (occasional tylenol or Aleve) Pain location: R knee, BLEs in general  Pain description: soreness  Aggravating factors: being on knee too much, doing too much at one time  Relieving factors: ice, pain meds, movement   PRECAUTIONS: None  WEIGHT BEARING RESTRICTIONS: No  FALLS:  Has patient fallen in last 6 months? No  LIVING ENVIRONMENT: Lives with: alone Lives in: House/apartment Stairs: Yes: External: 1 steps; none Has following  equipment at home: Walker - 2 wheeled  OCCUPATION: retired  PLOF: Independent  PATIENT GOALS: Walk without pain  Next MD visit: no follow up scheduled at this time  OBJECTIVE:   DIAGNOSTIC FINDINGS:  IMPRESSION: Postsurgical changes from right knee arthroplasty including subcutaneous and intra-articular air, as well as a joint effusion.  PATIENT SURVEYS:  03/06/2023: FOTO 76  02/06/2023: FOTO 57 (Goal met)  01/07/23: Cristina Sawyer intake:  40% , predicted 57%  COGNITION: Overall cognitive status: WFL    SENSATION: WFL  EDEMA:  01/07/23: Circumferential: Rt: 45 centimeters , Left: 42 centimeters   POSTURE: rounded shoulders, forward head, and decreased lumbar lordosis  PALPATION: 01/07/23: Pt with noted pain with palpation around med/lateral joint line of Rt knee, noted tenderness in distal hamstring tendons  LOWER EXTREMITY ROM:   ROM Right 01/07/23 Left 01/07/23 Right 01/10/23 Right  01/15/23 Rt 01/20/23 Rt 01/22/23 Rt 01/31/23 Rt 02/04/23 Right 02/06/2023 Rt 02/11/23 Rt 02/28/2023 Rt 03/04/23  Left/Right 03/13/2023  Hip flexion               Hip abduction               Hip adduction               Knee flexion 80 110 Passive 98 Supine AA 100*  Sitting P: 100 Supine A: 95 P: 100 105 active A: 104 P: 110 Active 104  A:105 105 A: 105 116/106  Knee extension   -10    -4 active A: -4  P: -2 Active -2 A: -2 -1 A: -2 P: 0 0/0  Ankle dorsiflexion               Ankle plantarflexion                (Blank rows = not tested)  LOWER EXTREMITY MMT:  MMT Right 01/07/23 Left 01/07/23 Left/Right 02/06/2023 in pounds Rt 02/11/23 Rt 03/11/23 Left/Right 03/13/2023  Hip flexion 4 5   5    Hip extension        Hip abduction        Hip adduction        Knee flexion 3 5   5    Knee extension 3 5 39.0/36.8 4  5  37.1/46.3  Ankle dorsiflexion        Ankle plantarflexion         (Blank rows = not tested)    FUNCTIONAL TESTS:  03/13/2023: < 10 seconds no hands  01/07/23: 5 time sit to stand: 24  sec c UE support  GAIT: Distance walked: 20 feet  Assistive device utilized: Walker - 2 wheeled Level of assistance: Modified independence Comments: decreased step length, decreased wt shift to Rt    TODAY'S TREATMENT  DATE: 03/13/2023 Recumbent bike Seat 5 for 5 minutes Level 2-3 Seated straight leg raises with 1# weights 3 sets of 5 for 3 seconds with slow eccentrics Tailgate knee flexion 1 minute Quadriceps sets 10 x 10 seconds  Neuromuscular re-education: Tandem balance: Eyes open; head turning; eyes closed 2 x 20 seconds each  Functional activities: Lateral step downs off 4 inch step 10 times bilaterally with hands as needed   03/11/2023: TherEx Recumbent bike; level 4, x 6 minutes Calf stretch: x 2 holding 30 sec Forward Step downs off 8 inch step x 10, Step ups on 8 inch step x 15  Leg Press: 75# bil LE's 2 x 10, Rt LE only 50# 2 x 10   NMR:  SLS on left with Rt foot on soccer ball rolling it in circles, x 1 minute, repeated on left Agility ladder: side stepping, diagonals out/in each x 4   Modalities:  Vasopneumatic: 34 deg, x 10 minutes medium compression    03/06/2023: Tailgate knee flexion 1 minute Quadriceps sets 10 x 10 seconds Seated knee flexion AAROM (left pushes right into flexion) 10 x 10 seconds Seated straight leg raises 3 sets of 5 for 3 seconds  Neuromuscular re-education: Tandem balance 2 x 20 seconds bilateral Dynamic balance on foam and gait drills (narrow gait, minimal lateral lean)  Functional Activities: Step-up and over 8 inch step (avoid hip ER) 20 x slow eccentrics without hand rails Lateral step-downs off 4 inch step (use hand rail) 2 sets of 10 x slow eccentrics   PATIENT EDUCATION:  Education details: HEP, POC Person educated: Patient Education method: Programmer, multimedia, Demonstration, Verbal cues, and Handouts Education comprehension: verbalized understanding,  returned demonstration, and verbal cues required  HOME EXERCISE PROGRAM: Access Code: [email or web address] URL: [email or web address] Date: 01/07/2023 Prepared by: Narda Amber  Exercises - Supine Quadricep Sets  - 2-3 x daily - 7 x weekly - 15 reps - Supine Active Straight Leg Raise  - 2-3 x daily - 7 x weekly - 15 reps - Supine Heel Slides  - 2-3 x daily - 7 x weekly - 15 reps - Seated Long Arc Quad  - 2-3 x daily - 7 x weekly - 15 reps - Sit to Stand with Counter Support  - 2-3 x daily - 7 x weekly - 10 reps  ASSESSMENT  CLINICAL IMPRESSION: Kosisochukwu has met all long-term goals with the exception of remaining quadriceps weakness.  I would not expect normal quadriceps strength at this point post-surgery and she is independent with her home exercise program which is addressing this for long-term success.  We reviewed Weslie's program today and gave her an opportunity to ask any questions about independent rehabilitation.  She expressed confidence in continuing this program independently and no she is welcome return anytime for questions or further supervised physical therapy if requested.  OBJECTIVE IMPAIRMENTS: decreased activity tolerance, decreased balance, decreased mobility, difficulty walking, decreased ROM, decreased strength, increased edema, impaired flexibility, and pain.   ACTIVITY LIMITATIONS: sitting, standing, squatting, sleeping, stairs, and transfers  PARTICIPATION LIMITATIONS: meal prep, cleaning, laundry, and community activity  PERSONAL FACTORS: Age and 3+ comorbidities: see above  are also affecting patient's functional outcome.   REHAB POTENTIAL: Good  CLINICAL DECISION MAKING: Stable/uncomplicated  EVALUATION COMPLEXITY: Low  GOALS: Goals reviewed with patient? Yes  SHORT TERM GOALS: (target date for Short term goals are 3 weeks 01/31/23)   1.  Patient will demonstrate independent use of home exercise program to maintain progress from in clinic  treatments.  Goal status: MET 01/22/23  LONG TERM GOALS: (target dates for all long term goals are 12 weeks  04/04/23 )   1. Patient will demonstrate/report pain at worst less than or equal to 2/10 to facilitate minimal limitation in daily activity secondary to pain symptoms.  Goal status: Met 03/13/2023   2. Patient will demonstrate independent use of home exercise program to facilitate ability to maintain/progress functional gains from skilled physical therapy services.  Goal status: Met 03/06/2023   3. Patient will demonstrate FOTO outcome > or = 57 % to indicate reduced disability due to condition.  Goal status: Met 03/06/2023   4.  Patient will demonstrate Rt LE MMT >/= 4/5 throughout to faciltiate usual transfers, stairs, squatting at [facility] for daily life.   Goal status: On-going 03/13/23   5.  Patient will demonstrate improved sit to stand to </= 14 seconds with no UE support.   Goal status: MET 02/18/23   6.  Pt will be able to amb in community around neighborhood for >/= 20 minutes safely.    Goal status: Met 03/13/2023  7. Pt will be able to navigate up and down on curb step with no device safely.    Goal status: MET 02/11/23    PLAN:  PT FREQUENCY: DC  PT DURATION: DC  PLANNED INTERVENTIONS: Therapeutic exercises, Therapeutic activity, Neuro Muscular re-education, Balance training, Gait training, Patient/Family education, Joint mobilization, Stair training, DME instructions, Dry Needling, Electrical stimulation, Traction, Cryotherapy, vasopneumatic deviceMoist heat, Taping, Ultrasound, Ionotophoresis 4mg /ml Dexamethasone, and aquatic therapy, Manual therapy.  All included unless contraindicated  PLAN FOR NEXT SESSION: Ronelle Nigh PT, MPT 03/13/23 4:37 PM   03/13/23 4:37 PM   03/13/23 4:37 PM

## 2023-03-21 ENCOUNTER — Ambulatory Visit (INDEPENDENT_AMBULATORY_CARE_PROVIDER_SITE_OTHER): Payer: Medicare HMO | Admitting: Physician Assistant

## 2023-03-21 DIAGNOSIS — Z96651 Presence of right artificial knee joint: Secondary | ICD-10-CM

## 2023-03-21 NOTE — Progress Notes (Signed)
Post-Op Visit Note   Patient: Cristina Sawyer           Date of Birth: 04-08-1941           MRN: [id number] Visit Date: 03/21/2023 PCP: Ronnald Nian, MD   Assessment & Plan:  Chief Complaint:  Chief Complaint  Patient presents with   Right Knee - Routine Post Op   Visit Diagnoses:  1. Status post total right knee replacement     Plan: Patient is a pleasant 82 year old female who comes in today 3 months status post right total knee replacement for 824.  She has been feeling good.  Minimal discomfort which is alleviated with Tylenol.  She finished physical therapy last week where she has regained a fair amount of range of motion.  Examination of the right knee reveals range of motion from 0 to 115 degrees.  Stable valgus varus stress.  She is neurovascularly intact distally.  At this point, she will continue to work on a home exercise program.  Continue to advance with activity as tolerated.  Follow-up in 6 months for repeat evaluation and 2 view x-rays of the right knee.  Call with concerns or questions.  Follow-Up Instructions: Return in about 9 months (around 12/20/2023).   Orders:  No orders of the defined types were placed in this encounter.  No orders of the defined types were placed in this encounter.   Imaging: No new imaging  PMFS History: Patient Active Problem List   Diagnosis Date Noted   Status post total right knee replacement 12/23/2022   Primary osteoarthritis of right knee 12/22/2022   History of GI bleed 06/06/2022   Osteopenia 08/30/2021   Aortic atherosclerosis (HCC) 07/12/2021   Diverticulosis 07/12/2021   Leukocytosis 07/05/2021   GAD (generalized anxiety disorder) 07/05/2021   Personal history of calcium pyrophosphate deposition disease (CPPD) 04/11/2020   History of vitamin D deficiency 07/26/2019   Age-related cataract of both eyes 05/19/2018   Hyperlipidemia 05/19/2018   CKD (chronic kidney disease) stage 3, GFR 30-59 ml/min (HCC) 02/14/2018    Essential hypertension 05/12/2017   Transient cerebral ischemia 05/12/2017   Seasonal allergic rhinitis due to pollen 12/29/2014   GERD (gastroesophageal reflux disease) 01/17/2014   Personal history of colonic adenoma 08/09/2008   Past Medical History:  Diagnosis Date   Arthritis    Cataract    Chronic kidney disease    Diverticulosis    Dyslipidemia    GERD (gastroesophageal reflux disease)    HTN (hypertension) 2015   Personal history of colonic adenoma 08/09/2008   Vitamin D deficiency     Family History  Problem Relation Age of Onset   Heart disease Sister    Heart disease Sister        in her 4s   Colon cancer Neg Hx     Past Surgical History:  Procedure Laterality Date   ABDOMINAL HYSTERECTOMY  1975   APPENDECTOMY  1975   COLONOSCOPY     HAND SURGERY     Cyst removal   THYROID SURGERY  2000   to remove nodule   TOTAL KNEE ARTHROPLASTY Right 12/23/2022   Procedure: RIGHT TOTAL KNEE ARTHROPLASTY;  Surgeon: Tarry Kos, MD;  Location: [facility] OR;  Service: Orthopedics;  Laterality: Right;   Social History   Occupational History   Occupation: Retired from Art therapist  Tobacco Use   Smoking status: Never   Smokeless tobacco: Never  Vaping Use   Vaping Use: Never used  Substance and Sexual Activity   Alcohol use: Yes    Alcohol/week: 1.0 standard drink of alcohol    Types: 1 Glasses of wine per week    Comment: socially   Drug use: No   Sexual activity: Yes

## 2023-03-22 ENCOUNTER — Other Ambulatory Visit: Payer: Self-pay | Admitting: Physician Assistant

## 2023-04-09 DIAGNOSIS — H524 Presbyopia: Secondary | ICD-10-CM | POA: Diagnosis not present

## 2023-04-09 DIAGNOSIS — H43393 Other vitreous opacities, bilateral: Secondary | ICD-10-CM | POA: Diagnosis not present

## 2023-04-09 DIAGNOSIS — H2513 Age-related nuclear cataract, bilateral: Secondary | ICD-10-CM | POA: Diagnosis not present

## 2023-04-09 DIAGNOSIS — H353233 Exudative age-related macular degeneration, bilateral, with inactive scar: Secondary | ICD-10-CM | POA: Diagnosis not present

## 2023-04-09 DIAGNOSIS — H52223 Regular astigmatism, bilateral: Secondary | ICD-10-CM | POA: Diagnosis not present

## 2023-04-10 ENCOUNTER — Other Ambulatory Visit: Payer: Self-pay | Admitting: Physician Assistant

## 2023-05-06 ENCOUNTER — Ambulatory Visit (INDEPENDENT_AMBULATORY_CARE_PROVIDER_SITE_OTHER): Payer: Medicare HMO

## 2023-05-06 DIAGNOSIS — Z0000 Encounter for general adult medical examination without abnormal findings: Secondary | ICD-10-CM

## 2023-05-06 NOTE — Patient Instructions (Signed)
Cristina Sawyer , Thank you for taking time to come for your Medicare Wellness Visit. I appreciate your ongoing commitment to your health goals. Please review the following plan we discussed and let me know if I can assist you in the future.   Referrals/Orders/Follow-Ups/Clinician Recommendations: none  This is a list of the screening recommended for you and due dates:  Health Maintenance  Topic Date Due   DTaP/Tdap/Td vaccine (2 - Td or Tdap) 07/13/2018   Colon Cancer Screening  03/16/2019   COVID-19 Vaccine (6 - 2023-24 season) 10/29/2022   Flu Shot  04/17/2023   Medicare Annual Wellness Visit  05/05/2024   Pneumonia Vaccine  Completed   DEXA scan (bone density measurement)  Completed   Zoster (Shingles) Vaccine  Completed   HPV Vaccine  Aged Out   Hepatitis C Screening  Discontinued    Advanced directives: (ACP Link)Information on Advanced Care Planning can be found at [facility] Advance Health Care Directives Advance Health Care Directives ([email or web address]   Next Medicare Annual Wellness Visit scheduled for next year: Yes  Preventive Care 65 Years and Older, Female Preventive care refers to lifestyle choices and visits with your health care provider that can promote health and wellness. What does preventive care include? A yearly physical exam. This is also called an annual well check. Dental exams once or twice a year. Routine eye exams. Ask your health care provider how often you should have your eyes checked. Personal lifestyle choices, including: Daily care of your teeth and gums. Regular physical activity. Eating a healthy diet. Avoiding tobacco and drug use. Limiting alcohol use. Practicing safe sex. Taking low-dose aspirin every day. Taking vitamin and mineral supplements as recommended by your health care provider. What happens during an annual well check? The services and screenings done by your health care provider during your annual well check  will depend on your age, overall health, lifestyle risk factors, and family history of disease. Counseling  Your health care provider may ask you questions about your: Alcohol use. Tobacco use. Drug use. Emotional well-being. Home and relationship well-being. Sexual activity. Eating habits. History of falls. Memory and ability to understand (cognition). Work and work Astronomer. Reproductive health. Screening  You may have the following tests or measurements: Height, weight, and BMI. Blood pressure. Lipid and cholesterol levels. These may be checked every 5 years, or more frequently if you are over 73 years old. Skin check. Lung cancer screening. You may have this screening every year starting at age 24 if you have a 30-pack-year history of smoking and currently smoke or have quit within the past 15 years. Fecal occult blood test (FOBT) of the stool. You may have this test every year starting at age 62. Flexible sigmoidoscopy or colonoscopy. You may have a sigmoidoscopy every 5 years or a colonoscopy every 10 years starting at age 46. Hepatitis C blood test. Hepatitis B blood test. Sexually transmitted disease (STD) testing. Diabetes screening. This is done by checking your blood sugar (glucose) after you have not eaten for a while (fasting). You may have this done every 1-3 years. Bone density scan. This is done to screen for osteoporosis. You may have this done starting at age 108. Mammogram. This may be done every 1-2 years. Talk to your health care provider about how often you should have regular mammograms. Talk with your health care provider about your test results, treatment options, and if necessary, the need for more tests. Vaccines  Your health care  provider may recommend certain vaccines, such as: Influenza vaccine. This is recommended every year. Tetanus, diphtheria, and acellular pertussis (Tdap, Td) vaccine. You may need a Td booster every 10 years. Zoster vaccine. You  may need this after age 31. Pneumococcal 13-valent conjugate (PCV13) vaccine. One dose is recommended after age 41. Pneumococcal polysaccharide (PPSV23) vaccine. One dose is recommended after age 36. Talk to your health care provider about which screenings and vaccines you need and how often you need them. This information is not intended to replace advice given to you by your health care provider. Make sure you discuss any questions you have with your health care provider. Document Released: 09/29/2015 Document Revised: 05/22/2016 Document Reviewed: 07/04/2015 Elsevier Interactive Patient Education  2017 [employer].  Fall Prevention in the Home Falls can cause injuries. They can happen to people of all ages. There are many things you can do to make your home safe and to help prevent falls. What can I do on the outside of my home? Regularly fix the edges of walkways and driveways and fix any cracks. Remove anything that might make you trip as you walk through a door, such as a raised step or threshold. Trim any bushes or trees on the path to your home. Use bright outdoor lighting. Clear any walking paths of anything that might make someone trip, such as rocks or tools. Regularly check to see if handrails are loose or broken. Make sure that both sides of any steps have handrails. Any raised decks and porches should have guardrails on the edges. Have any leaves, snow, or ice cleared regularly. Use sand or salt on walking paths during winter. Clean up any spills in your garage right away. This includes oil or grease spills. What can I do in the bathroom? Use night lights. Install grab bars by the toilet and in the tub and shower. Do not use towel bars as grab bars. Use non-skid mats or decals in the tub or shower. If you need to sit down in the shower, use a plastic, non-slip stool. Keep the floor dry. Clean up any water that spills on the floor as soon as it happens. Remove soap buildup  in the tub or shower regularly. Attach bath mats securely with double-sided non-slip rug tape. Do not have throw rugs and other things on the floor that can make you trip. What can I do in the bedroom? Use night lights. Make sure that you have a light by your bed that is easy to reach. Do not use any sheets or blankets that are too big for your bed. They should not hang down onto the floor. Have a firm chair that has side arms. You can use this for support while you get dressed. Do not have throw rugs and other things on the floor that can make you trip. What can I do in the kitchen? Clean up any spills right away. Avoid walking on wet floors. Keep items that you use a lot in easy-to-reach places. If you need to reach something above you, use a strong step stool that has a grab bar. Keep electrical cords out of the way. Do not use floor polish or wax that makes floors slippery. If you must use wax, use non-skid floor wax. Do not have throw rugs and other things on the floor that can make you trip. What can I do with my stairs? Do not leave any items on the stairs. Make sure that there are handrails on both  sides of the stairs and use them. Fix handrails that are broken or loose. Make sure that handrails are as long as the stairways. Check any carpeting to make sure that it is firmly attached to the stairs. Fix any carpet that is loose or worn. Avoid having throw rugs at the top or bottom of the stairs. If you do have throw rugs, attach them to the floor with carpet tape. Make sure that you have a light switch at the top of the stairs and the bottom of the stairs. If you do not have them, ask someone to add them for you. What else can I do to help prevent falls? Wear shoes that: Do not have high heels. Have rubber bottoms. Are comfortable and fit you well. Are closed at the toe. Do not wear sandals. If you use a stepladder: Make sure that it is fully opened. Do not climb a closed  stepladder. Make sure that both sides of the stepladder are locked into place. Ask someone to hold it for you, if possible. Clearly mark and make sure that you can see: Any grab bars or handrails. First and last steps. Where the edge of each step is. Use tools that help you move around (mobility aids) if they are needed. These include: Canes. Walkers. Scooters. Crutches. Turn on the lights when you go into a dark area. Replace any light bulbs as soon as they burn out. Set up your furniture so you have a clear path. Avoid moving your furniture around. If any of your floors are uneven, fix them. If there are any pets around you, be aware of where they are. Review your medicines with your doctor. Some medicines can make you feel dizzy. This can increase your chance of falling. Ask your doctor what other things that you can do to help prevent falls. This information is not intended to replace advice given to you by your health care provider. Make sure you discuss any questions you have with your health care provider. Document Released: 06/29/2009 Document Revised: 02/08/2016 Document Reviewed: 10/07/2014 Elsevier Interactive Patient Education  2017 [employer].

## 2023-05-06 NOTE — Progress Notes (Signed)
Subjective:   Cristina Sawyer is a 82 y.o. female who presents for Medicare Annual (Subsequent) preventive examination.  Visit Complete: Virtual  I connected with  Shara Blazing on 05/06/23 by a audio enabled telemedicine application and verified that I am speaking with the correct person using two identifiers.  Patient Location: Home  Provider Location: Office/Clinic  I discussed the limitations of evaluation and management by telemedicine. The patient expressed understanding and agreed to proceed.  Vital Signs: Unable to obtain new vitals due to this being a telehealth visit.  Review of Systems     Cardiac Risk Factors include: advanced age (>63men, >15 women);dyslipidemia;hypertension     Objective:    Today's Vitals   There is no height or weight on file to calculate BMI.     05/06/2023    1:32 PM 01/07/2023    1:51 PM 12/23/2022    9:04 AM 12/12/2022    1:09 PM 09/11/2021    1:49 PM 08/07/2021   10:08 AM 07/05/2021   10:24 AM  Advanced Directives  Does Patient Have a Medical Advance Directive? No No No No No No   Would patient like information on creating a medical advance directive?  No - Patient declined No - Patient declined No - Patient declined  Yes (ED - Information included in AVS) No - Patient declined    Current Medications (verified) Outpatient Encounter Medications as of 05/06/2023  Medication Sig   amLODipine (NORVASC) 10 MG tablet TAKE 1 TABLET BY MOUTH EVERY DAY   aspirin EC 81 MG tablet Take 1 tablet (81 mg total) by mouth 2 (two) times daily. To be taken after surgery to prevent blood clots   cetirizine (ZYRTEC) 10 MG tablet Take 10 mg by mouth daily as needed for allergies.   docusate sodium (COLACE) 100 MG capsule Take 1 capsule (100 mg total) by mouth daily as needed.   Multiple Vitamin (MULTI-VITAMIN DAILY PO) Take 1 tablet by mouth 3 (three) times a week.   pantoprazole (PROTONIX) 40 MG tablet TAKE 1 TABLET (40 MG TOTAL) BY MOUTH DAILY. NEEDS  OFFICE VISIT.   rosuvastatin (CRESTOR) 5 MG tablet TAKE 1 TABLET (5 MG TOTAL) BY MOUTH DAILY.   trolamine salicylate (ASPERCREME) 10 % cream Apply 1 Application topically as needed for muscle pain.   VITAMIN D PO Take 2,000 Units by mouth daily.   methocarbamol (ROBAXIN) 500 MG tablet Take 1 tablet (500 mg total) by mouth 2 (two) times daily as needed. (Patient not taking: Reported on 05/06/2023)   ondansetron (ZOFRAN) 4 MG tablet Take 1 tablet (4 mg total) by mouth every 8 (eight) hours as needed for nausea or vomiting. (Patient not taking: Reported on 05/06/2023)   oxyCODONE-acetaminophen (PERCOCET) 5-325 MG tablet Take 1 tablet by mouth every 8 (eight) hours as needed. (Patient not taking: Reported on 05/06/2023)   [DISCONTINUED] omeprazole (PRILOSEC OTC) 20 MG tablet Take 20 mg by mouth daily.     No facility-administered encounter medications on file as of 05/06/2023.    Allergies (verified) Codeine and Ciprofloxacin hcl   History: Past Medical History:  Diagnosis Date   Arthritis    Cataract    Chronic kidney disease    Diverticulosis    Dyslipidemia    GERD (gastroesophageal reflux disease)    HTN (hypertension) 2015   Personal history of colonic adenoma 08/09/2008   Vitamin D deficiency    Past Surgical History:  Procedure Laterality Date   ABDOMINAL HYSTERECTOMY  1975  APPENDECTOMY  1975   COLONOSCOPY     HAND SURGERY     Cyst removal   THYROID SURGERY  2000   to remove nodule   TOTAL KNEE ARTHROPLASTY Right 12/23/2022   Procedure: RIGHT TOTAL KNEE ARTHROPLASTY;  Surgeon: Tarry Kos, MD;  Location: [facility] OR;  Service: Orthopedics;  Laterality: Right;   Family History  Problem Relation Age of Onset   Heart disease Sister    Heart disease Sister        in her 22s   Colon cancer Neg Hx    Social History   Socioeconomic History   Marital status: Widowed    Spouse name: Not on file   Number of children: Not on file   Years of education: Not on file   Highest  education level: Not on file  Occupational History   Occupation: Retired from Community education officer and [employer]  Tobacco Use   Smoking status: Never   Smokeless tobacco: Never  Vaping Use   Vaping status: Never Used  Substance and Sexual Activity   Alcohol use: Yes    Alcohol/week: 1.0 standard drink of alcohol    Types: 1 Glasses of wine per week    Comment: socially   Drug use: No   Sexual activity: Yes  Other Topics Concern   Not on file  Social History Narrative   Pt lives alone.    Social Determinants of Health   Financial Resource Strain: Low Risk  (05/06/2023)   Overall Financial Resource Strain (CARDIA)    Difficulty of Paying Living Expenses: Not hard at all  Food Insecurity: No Food Insecurity (05/06/2023)   Hunger Vital Sign    Worried About Running Out of Food in the Last Year: Never true    Ran Out of Food in the Last Year: Never true  Transportation Needs: No Transportation Needs (05/06/2023)   PRAPARE - Administrator, Civil Service (Medical): No    Lack of Transportation (Non-Medical): No  Physical Activity: Insufficiently Active (05/06/2023)   Exercise Vital Sign    Days of Exercise per Week: 4 days    Minutes of Exercise per Session: 20 min  Stress: No Stress Concern Present (05/06/2023)   [employer] of Occupational Health - Occupational Stress Questionnaire    Feeling of Stress : Not at all  Social Connections: Socially Isolated (05/06/2023)   Social Connection and Isolation Panel [NHANES]    Frequency of Communication with Friends and Family: More than three times a week    Frequency of Social Gatherings with Friends and Family: Once a week    Attends Religious Services: Never    Database administrator or Organizations: No    Attends Banker Meetings: Never    Marital Status: Widowed    Tobacco Counseling Counseling given: Not Answered   Clinical Intake:  Pre-visit preparation completed: Yes  Pain : No/denies pain      Nutritional Risks: None Diabetes: No  How often do you need to have someone help you when you read instructions, pamphlets, or other written materials from your doctor or pharmacy?: 1 - Never  Interpreter Needed?: No  Information entered by :: NAllen LPN   Activities of Daily Living    05/06/2023    1:26 PM 12/12/2022    1:13 PM  In your present state of health, do you have any difficulty performing the following activities:  Hearing? 0   Vision? 0   Difficulty concentrating or making decisions? 0  Walking or climbing stairs? 0   Dressing or bathing? 0   Doing errands, shopping? 0 0  Preparing Food and eating ? N   Using the Toilet? N   In the past six months, have you accidently leaked urine? N   Do you have problems with loss of bowel control? N   Managing your Medications? N   Managing your Finances? N   Housekeeping or managing your Housekeeping? N     Patient Care Team: Ronnald Nian, MD as PCP - General (Family Medicine) Quintella Reichert, MD as PCP - Cardiology (Cardiology)  Indicate any recent Medical Services you may have received from other than [facility] providers in the past year (date may be approximate).     Assessment:   This is a routine wellness examination for Zannie.  Hearing/Vision screen Hearing Screening - Comments:: Denies hearing issues Vision Screening - Comments:: Regular eye exams, Dr. Yetta Barre  Dietary issues and exercise activities discussed:     Goals Addressed             This Visit's Progress    Patient Stated       05/06/2023, stay healthy       Depression Screen    05/06/2023    1:34 PM 08/29/2022   10:10 AM 08/07/2021   10:09 AM 08/02/2020    9:28 AM 05/09/2020   11:35 AM 07/26/2019    1:47 PM 05/19/2018    1:30 PM  PHQ 2/9 Scores  PHQ - 2 Score 0 0 0 0 0 2 0  PHQ- 9 Score 2     8     Fall Risk    05/06/2023    1:33 PM 08/29/2022   10:10 AM 08/07/2021   10:09 AM 08/02/2020    9:28 AM 07/26/2019    1:46 PM  Fall  Risk   Falls in the past year? 0 0 0 0 0  Number falls in past yr: 0 0 0    Injury with Fall? 0 0 0    Risk for fall due to : Medication side effect No Fall Risks No Fall Risks    Follow up Falls prevention discussed;Falls evaluation completed Falls evaluation completed Falls evaluation completed      MEDICARE RISK AT HOME: Medicare Risk at Home Any stairs in or around the home?: Yes If so, are there any without handrails?: No Home free of loose throw rugs in walkways, pet beds, electrical cords, etc?: Yes Adequate lighting in your home to reduce risk of falls?: Yes Life alert?: No Use of a cane, walker or w/c?: No Grab bars in the bathroom?: No Shower chair or bench in shower?: No Elevated toilet seat or a handicapped toilet?: Yes  TIMED UP AND GO:  Was the test performed?  No    Cognitive Function:        05/06/2023    1:36 PM  6CIT Screen  What Year? 0 points  What month? 0 points  What time? 0 points  Count back from 20 0 points  Months in reverse 0 points  Repeat phrase 0 points  Total Score 0 points    Immunizations Immunization History  Administered Date(s) Administered   COVID-19, mRNA, vaccine(Comirnaty)12 years and older 06/28/2022   Fluad Quad(high Dose 65+) 06/15/2019, 06/06/2020, 07/12/2021, 06/06/2022   Influenza, High Dose Seasonal PF 08/06/2013, 07/04/2015, 07/02/2016, 05/12/2017, 07/20/2018   Influenza-Unspecified 07/20/2018   PFIZER(Purple Top)SARS-COV-2 Vaccination 10/21/2019, 11/11/2019, 06/06/2020   Pfizer Covid-19 Vaccine Bivalent Booster 21yrs &  up 08/07/2021   Pneumococcal Conjugate-13 01/17/2014   Pneumococcal Polysaccharide-23 07/13/2008, 01/09/2016   Tdap 07/13/2008   Zoster Recombinant(Shingrix) 12/06/2021, 03/26/2022    TDAP status: Due, Education has been provided regarding the importance of this vaccine. Advised may receive this vaccine at local pharmacy or Health Dept. Aware to provide a copy of the vaccination record if obtained  from local pharmacy or Health Dept. Verbalized acceptance and understanding.  Flu Vaccine status: Due, Education has been provided regarding the importance of this vaccine. Advised may receive this vaccine at local pharmacy or Health Dept. Aware to provide a copy of the vaccination record if obtained from local pharmacy or Health Dept. Verbalized acceptance and understanding.  Pneumococcal vaccine status: Up to date  Covid-19 vaccine status: Completed vaccines  Qualifies for Shingles Vaccine? Yes   Zostavax completed Yes   Shingrix Completed?: Yes  Screening Tests Health Maintenance  Topic Date Due   DTaP/Tdap/Td (2 - Td or Tdap) 07/13/2018   Colonoscopy  03/16/2019   COVID-19 Vaccine (6 - 2023-24 season) 10/29/2022   INFLUENZA VACCINE  04/17/2023   Medicare Annual Wellness (AWV)  05/05/2024   Pneumonia Vaccine 40+ Years old  Completed   DEXA SCAN  Completed   Zoster Vaccines- Shingrix  Completed   HPV VACCINES  Aged Out   Hepatitis C Screening  Discontinued    Health Maintenance  Health Maintenance Due  Topic Date Due   DTaP/Tdap/Td (2 - Td or Tdap) 07/13/2018   Colonoscopy  03/16/2019   COVID-19 Vaccine (6 - 2023-24 season) 10/29/2022   INFLUENZA VACCINE  04/17/2023    Colorectal cancer screening: No longer required.   Mammogram status: No longer required due to age.  Bone Density status: Completed 08/29/2021.  Lung Cancer Screening: (Low Dose CT Chest recommended if Age 77-80 years, 20 pack-year currently smoking OR have quit w/in 15years.) does not qualify.   Lung Cancer Screening Referral: no  Additional Screening:  Hepatitis C Screening: does not qualify;   Vision Screening: Recommended annual ophthalmology exams for early detection of glaucoma and other disorders of the eye. Is the patient up to date with their annual eye exam?  Yes  Who is the provider or what is the name of the office in which the patient attends annual eye exams? Dr. Yetta Barre If pt is not  established with a provider, would they like to be referred to a provider to establish care? No .   Dental Screening: Recommended annual dental exams for proper oral hygiene  Diabetic Foot Exam: n/a  Community Resource Referral / Chronic Care Management: CRR required this visit?  No   CCM required this visit?  No     Plan:     I have personally reviewed and noted the following in the patient's chart:   Medical and social history Use of alcohol, tobacco or illicit drugs  Current medications and supplements including opioid prescriptions. Patient is not currently taking opioid prescriptions. Functional ability and status Nutritional status Physical activity Advanced directives List of other physicians Hospitalizations, surgeries, and ER visits in previous 12 months Vitals Screenings to include cognitive, depression, and falls Referrals and appointments  In addition, I have reviewed and discussed with patient certain preventive protocols, quality metrics, and best practice recommendations. A written personalized care plan for preventive services as well as general preventive health recommendations were provided to patient.     Barb Merino, LPN   12/28/2438   After Visit Summary: (MyChart) Due to this being a telephonic  visit, the after visit summary with patients personalized plan was offered to patient via MyChart   Nurse Notes: none

## 2023-05-30 ENCOUNTER — Other Ambulatory Visit: Payer: Self-pay | Admitting: Family Medicine

## 2023-05-30 DIAGNOSIS — Z8673 Personal history of transient ischemic attack (TIA), and cerebral infarction without residual deficits: Secondary | ICD-10-CM

## 2023-07-16 ENCOUNTER — Other Ambulatory Visit: Payer: Self-pay | Admitting: Physician Assistant

## 2023-09-03 ENCOUNTER — Encounter: Payer: Self-pay | Admitting: Family Medicine

## 2023-09-03 ENCOUNTER — Ambulatory Visit (INDEPENDENT_AMBULATORY_CARE_PROVIDER_SITE_OTHER): Payer: Medicare HMO | Admitting: Family Medicine

## 2023-09-03 VITALS — BP 112/70 | HR 72 | Ht 63.0 in | Wt 185.8 lb

## 2023-09-03 DIAGNOSIS — F411 Generalized anxiety disorder: Secondary | ICD-10-CM

## 2023-09-03 DIAGNOSIS — Z860100 Personal history of colon polyps, unspecified: Secondary | ICD-10-CM

## 2023-09-03 DIAGNOSIS — Z0000 Encounter for general adult medical examination without abnormal findings: Secondary | ICD-10-CM

## 2023-09-03 DIAGNOSIS — E782 Mixed hyperlipidemia: Secondary | ICD-10-CM | POA: Diagnosis not present

## 2023-09-03 DIAGNOSIS — J301 Allergic rhinitis due to pollen: Secondary | ICD-10-CM

## 2023-09-03 DIAGNOSIS — Z96651 Presence of right artificial knee joint: Secondary | ICD-10-CM

## 2023-09-03 DIAGNOSIS — I700 Atherosclerosis of aorta: Secondary | ICD-10-CM | POA: Diagnosis not present

## 2023-09-03 DIAGNOSIS — H25013 Cortical age-related cataract, bilateral: Secondary | ICD-10-CM

## 2023-09-03 DIAGNOSIS — M8580 Other specified disorders of bone density and structure, unspecified site: Secondary | ICD-10-CM

## 2023-09-03 DIAGNOSIS — Z1211 Encounter for screening for malignant neoplasm of colon: Secondary | ICD-10-CM

## 2023-09-03 DIAGNOSIS — M1711 Unilateral primary osteoarthritis, right knee: Secondary | ICD-10-CM

## 2023-09-03 DIAGNOSIS — R1011 Right upper quadrant pain: Secondary | ICD-10-CM

## 2023-09-03 DIAGNOSIS — N1831 Chronic kidney disease, stage 3a: Secondary | ICD-10-CM | POA: Diagnosis not present

## 2023-09-03 DIAGNOSIS — Z8739 Personal history of other diseases of the musculoskeletal system and connective tissue: Secondary | ICD-10-CM

## 2023-09-03 DIAGNOSIS — R350 Frequency of micturition: Secondary | ICD-10-CM

## 2023-09-03 DIAGNOSIS — I10 Essential (primary) hypertension: Secondary | ICD-10-CM | POA: Diagnosis not present

## 2023-09-03 DIAGNOSIS — M25512 Pain in left shoulder: Secondary | ICD-10-CM

## 2023-09-03 DIAGNOSIS — K219 Gastro-esophageal reflux disease without esophagitis: Secondary | ICD-10-CM

## 2023-09-03 DIAGNOSIS — Z8673 Personal history of transient ischemic attack (TIA), and cerebral infarction without residual deficits: Secondary | ICD-10-CM

## 2023-09-03 LAB — LIPID PANEL

## 2023-09-03 MED ORDER — ROSUVASTATIN CALCIUM 5 MG PO TABS: 5.0000 mg | ORAL_TABLET | Freq: Every day | ORAL | 3 refills | Status: DC

## 2023-09-03 MED ORDER — AMLODIPINE BESYLATE 10 MG PO TABS: 10.0000 mg | ORAL_TABLET | Freq: Every day | ORAL | 3 refills | Status: AC

## 2023-09-03 NOTE — Progress Notes (Signed)
Complete physical exam  Patient: Cristina Sawyer   DOB: 12/02/1940   82 y.o. Female  MRN: [id number]  Subjective:    Chief Complaint  Patient presents with   Annual Exam   Arm Pain    Upper left arm muscle pain for a month. Difficult to lift arm.    Abdominal Pain    Pain on right side for 2-3 weeks.    Referral    Referral to urologist   Urinary Frequency    Noticeably increased     Cristina Sawyer is a 82 y.o. female who presents today for a complete physical exam. She reports consuming a general diet. Home exercise routine includes stationary bike, leg exercises, and walking for 10-15 min every other day (2-3 times a week). She generally feels well. She reports sleeping fairly well. She complains of left shoulder pain especially if she abducts.  No popping or grinding or overuse or injury.  She also complains of some slight urinary frequency but no dysuria.  She has had difficulty with some right mid and upper quadrant discomfort that is made worse with motion but also made worse with eating greasy foods and does cause him difficulty with bloating and gas.  No nausea or vomiting.  She has had a TKR on the right and is doing quite well with that.  She follows up regularly with ophthalmology for cataract.  Her reflux is not causing any trouble.  She does have a history of colonic polyps but at this point is not interested in pursuing this much further.  Her allergies seem to be under good control.  She continues on Protonix without difficulty.  She plans to get Tdap and RSV.  She does take OTC medications for her allergies.  Psychologically she seems to be doing quite well.   Most recent fall risk assessment:    09/03/2023    1:36 PM  Fall Risk   Falls in the past year? 0  Number falls in past yr: 0  Injury with Fall? 0     Most recent depression screenings:    09/03/2023    1:36 PM 05/06/2023    1:34 PM  PHQ 2/9 Scores  PHQ - 2 Score 0 0  PHQ- 9 Score  2     Vision:Within last year and Dental: Current dental problems and Last dental visit: March 2024    Patient Care Team: Ronnald Nian, MD as PCP - General (Family Medicine) Quintella Reichert, MD as PCP - Cardiology (Cardiology)   Outpatient Medications Prior to Visit  Medication Sig   aspirin EC 81 MG tablet Take 1 tablet (81 mg total) by mouth 2 (two) times daily. To be taken after surgery to prevent blood clots   Multiple Vitamin (MULTI-VITAMIN DAILY PO) Take 1 tablet by mouth 3 (three) times a week.   pantoprazole (PROTONIX) 40 MG tablet TAKE 1 TABLET (40 MG TOTAL) BY MOUTH DAILY. NEEDS OFFICE VISIT.   trolamine salicylate (ASPERCREME) 10 % cream Apply 1 Application topically as needed for muscle pain.   VITAMIN D PO Take 2,000 Units by mouth daily.   [DISCONTINUED] amLODipine (NORVASC) 10 MG tablet TAKE 1 TABLET BY MOUTH EVERY DAY   [DISCONTINUED] rosuvastatin (CRESTOR) 5 MG tablet TAKE 1 TABLET (5 MG TOTAL) BY MOUTH DAILY.   cetirizine (ZYRTEC) 10 MG tablet Take 10 mg by mouth daily as needed for allergies. (Patient not taking: Reported on 09/03/2023)   docusate sodium (COLACE) 100 MG capsule Take  1 capsule (100 mg total) by mouth daily as needed. (Patient not taking: Reported on 09/03/2023)   methocarbamol (ROBAXIN) 500 MG tablet Take 1 tablet (500 mg total) by mouth 2 (two) times daily as needed. (Patient not taking: Reported on 09/03/2023)   ondansetron (ZOFRAN) 4 MG tablet Take 1 tablet (4 mg total) by mouth every 8 (eight) hours as needed for nausea or vomiting. (Patient not taking: Reported on 09/03/2023)   oxyCODONE-acetaminophen (PERCOCET) 5-325 MG tablet Take 1 tablet by mouth every 8 (eight) hours as needed. (Patient not taking: Reported on 05/06/2023)   No facility-administered medications prior to visit.    Review of Systems  All other systems reviewed and are negative. Family and social history as well as health maintenance and immunizations was reviewed         Objective:        Physical Exam  Urine dipstick was negative.  Exam of the left shoulder shows good motion.  Negative sulcus sign.  No tenderness over bicipital groove.  Neer's and Hawkins test was uncomfortable. Alert and in no distress. Tympanic membranes and canals are normal. Pharyngeal area is normal. Neck is supple without adenopathy or thyromegaly. Cardiac exam shows a regular sinus rhythm without murmurs or gallops. Lungs are clear to auscultation.  Abdominal exam shows normal bowel sounds with Murphy sign and Murphy's punch being negative.      Assessment & Plan:    Routine general medical examination at a health care facility - Plan: CBC with Differential/Platelet, Comprehensive metabolic panel, Lipid panel  Cortical age-related cataract of both eyes  Aortic atherosclerosis (HCC) - Plan: Lipid panel  Stage 3a chronic kidney disease (HCC)  Essential hypertension - Plan: CBC with Differential/Platelet, Comprehensive metabolic panel, amLODipine (NORVASC) 10 MG tablet  GAD (generalized anxiety disorder)  Gastroesophageal reflux disease without esophagitis  History of colonic polyps  Mixed hyperlipidemia - Plan: Lipid panel  Seasonal allergic rhinitis due to pollen  Status post total right knee replacement  Personal history of calcium pyrophosphate deposition disease (CPPD)  Osteopenia, unspecified location - Plan: DG Bone Density  Screening for colon cancer - Plan: Cologuard  Frequency of micturition  Right upper quadrant pain - Plan: US Abdomen Limited RUQ (LIVER/GB)  Acute pain of left shoulder - Plan: DG Shoulder Left  History of TIA (transient ischemic attack) - Plan: rosuvastatin (CRESTOR) 5 MG tablet  Immunization History  Administered Date(s) Administered   Fluad Quad(high Dose 65+) 06/15/2019, 06/06/2020, 07/12/2021, 06/06/2022   Influenza, High Dose Seasonal PF 08/06/2013, 07/04/2015, 07/02/2016, 05/12/2017, 07/20/2018, 07/02/2023    Influenza-Unspecified 07/20/2018   PFIZER Comirnaty(Gray Top)Covid-19 Tri-Sucrose Vaccine 07/02/2023   PFIZER(Purple Top)SARS-COV-2 Vaccination 10/21/2019, 11/11/2019, 06/06/2020   Pfizer Covid-19 Vaccine Bivalent Booster 24yrs & up 08/07/2021   Pfizer(Comirnaty)Fall Seasonal Vaccine 12 years and older 06/28/2022   Pneumococcal Conjugate-13 01/17/2014   Pneumococcal Polysaccharide-23 07/13/2008, 01/09/2016   Tdap 07/13/2008   Zoster Recombinant(Shingrix) 12/06/2021, 03/26/2022    Health Maintenance  Topic Date Due   DTaP/Tdap/Td (2 - Td or Tdap) 07/13/2018   Colonoscopy  03/16/2019   COVID-19 Vaccine (7 - 2024-25 season) 08/27/2023   Medicare Annual Wellness (AWV)  05/05/2024   Pneumonia Vaccine 59+ Years old  Completed   INFLUENZA VACCINE  Completed   DEXA SCAN  Completed   Zoster Vaccines- Shingrix  Completed   HPV VACCINES  Aged Out   Hepatitis C Screening  Discontinued     Problem List Items Addressed This Visit     Age-related cataract of both  eyes   Aortic atherosclerosis (HCC)   Relevant Medications   amLODipine (NORVASC) 10 MG tablet   rosuvastatin (CRESTOR) 5 MG tablet   Other Relevant Orders   Lipid panel   CKD (chronic kidney disease) stage 3, GFR 30-59 ml/min (HCC)   Essential hypertension   Relevant Medications   amLODipine (NORVASC) 10 MG tablet   rosuvastatin (CRESTOR) 5 MG tablet   Other Relevant Orders   CBC with Differential/Platelet   Comprehensive metabolic panel   GAD (generalized anxiety disorder)   GERD (gastroesophageal reflux disease)   History of colonic polyps   Hyperlipidemia   Relevant Medications   amLODipine (NORVASC) 10 MG tablet   rosuvastatin (CRESTOR) 5 MG tablet   Other Relevant Orders   Lipid panel   Osteopenia   Relevant Orders   DG Bone Density   Personal history of calcium pyrophosphate deposition disease (CPPD)   Primary osteoarthritis of right knee   Seasonal allergic rhinitis due to pollen   Other Visit Diagnoses        Routine general medical examination at a health care facility    -  Primary   Relevant Orders   CBC with Differential/Platelet   Comprehensive metabolic panel   Lipid panel     Screening for colon cancer       Relevant Orders   Cologuard     Frequency of micturition         Right upper quadrant pain       Relevant Orders   US Abdomen Limited RUQ (LIVER/GB)     Acute pain of left shoulder       Relevant Orders   DG Shoulder Left     History of TIA (transient ischemic attack)       Relevant Medications   rosuvastatin (CRESTOR) 5 MG tablet      No follow-ups on file.     Sharlot Gowda, MD

## 2023-09-04 LAB — CBC WITH DIFFERENTIAL/PLATELET
Basophils Absolute: 0.1 10*3/uL (ref 0.0–0.2)
Basos: 1 %
EOS (ABSOLUTE): 0.1 10*3/uL (ref 0.0–0.4)
Eos: 2 %
Hematocrit: 44.2 % (ref 34.0–46.6)
Hemoglobin: 14.2 g/dL (ref 11.1–15.9)
Immature Grans (Abs): 0 10*3/uL (ref 0.0–0.1)
Immature Granulocytes: 0 %
Lymphocytes Absolute: 1.4 10*3/uL (ref 0.7–3.1)
Lymphs: 17 %
MCH: 27.2 pg (ref 26.6–33.0)
MCHC: 32.1 g/dL (ref 31.5–35.7)
MCV: 85 fL (ref 79–97)
Monocytes Absolute: 0.5 10*3/uL (ref 0.1–0.9)
Monocytes: 6 %
Neutrophils Absolute: 6 10*3/uL (ref 1.4–7.0)
Neutrophils: 74 %
Platelets: 258 10*3/uL (ref 150–450)
RBC: 5.22 x10E6/uL (ref 3.77–5.28)
RDW: 14.3 % (ref 11.7–15.4)
WBC: 8.1 10*3/uL (ref 3.4–10.8)

## 2023-09-04 LAB — COMPREHENSIVE METABOLIC PANEL
ALT: 13 IU/L (ref 0–32)
AST: 17 IU/L (ref 0–40)
Albumin: 4.6 g/dL (ref 3.7–4.7)
Alkaline Phosphatase: 101 IU/L (ref 44–121)
BUN/Creatinine Ratio: 15 (ref 12–28)
BUN: 16 mg/dL (ref 8–27)
Bilirubin Total: 0.6 mg/dL (ref 0.0–1.2)
CO2: 24 mmol/L (ref 20–29)
Calcium: 10.1 mg/dL (ref 8.7–10.3)
Chloride: 105 mmol/L (ref 96–106)
Creatinine, Ser: 1.07 mg/dL — ABNORMAL HIGH (ref 0.57–1.00)
Globulin, Total: 2.6 g/dL (ref 1.5–4.5)
Glucose: 99 mg/dL (ref 70–99)
Potassium: 4.9 mmol/L (ref 3.5–5.2)
Sodium: 144 mmol/L (ref 134–144)
Total Protein: 7.2 g/dL (ref 6.0–8.5)
eGFR: 52 mL/min/{1.73_m2} — ABNORMAL LOW (ref >59)

## 2023-09-04 LAB — LIPID PANEL
Cholesterol, Total: 192 mg/dL (ref 100–199)
HDL: 92 mg/dL (ref >39)
LDL CALC COMMENT:: 2.1 ratio (ref 0.0–4.4)
LDL Chol Calc (NIH): 87 mg/dL (ref 0–99)
Triglycerides: 74 mg/dL (ref 0–149)
VLDL Cholesterol Cal: 13 mg/dL (ref 5–40)

## 2023-09-05 ENCOUNTER — Ambulatory Visit
Admission: RE | Admit: 2023-09-05 | Discharge: 2023-09-05 | Disposition: A | Discharge status: Home / Self Care | Payer: Medicare HMO | Source: Ambulatory Visit | Attending: Family Medicine | Admitting: Family Medicine

## 2023-09-05 ENCOUNTER — Encounter: Payer: Self-pay | Admitting: Family Medicine

## 2023-09-05 DIAGNOSIS — R1011 Right upper quadrant pain: Secondary | ICD-10-CM | POA: Diagnosis not present

## 2023-09-05 DIAGNOSIS — M25512 Pain in left shoulder: Secondary | ICD-10-CM | POA: Diagnosis not present

## 2023-09-05 NOTE — Addendum Note (Signed)
Addended by: Ronnald Nian on: 09/05/2023 04:25 PM   Modules accepted: Orders

## 2023-09-15 ENCOUNTER — Ambulatory Visit (HOSPITAL_COMMUNITY)
Admission: RE | Admit: 2023-09-15 | Discharge: 2023-09-15 | Disposition: A | Discharge status: Home / Self Care | Payer: Medicare HMO | Source: Ambulatory Visit | Attending: Family Medicine | Admitting: Family Medicine

## 2023-09-15 DIAGNOSIS — R1011 Right upper quadrant pain: Secondary | ICD-10-CM | POA: Diagnosis not present

## 2023-09-15 MED ORDER — TECHNETIUM TC 99M MEBROFENIN IV KIT
5.5000 | PACK | Freq: Once | INTRAVENOUS | Status: AC
  Administered 2023-09-15: 5.5 via INTRAVENOUS

## 2023-09-23 DIAGNOSIS — Z1211 Encounter for screening for malignant neoplasm of colon: Secondary | ICD-10-CM | POA: Diagnosis not present

## 2023-10-01 ENCOUNTER — Encounter: Payer: Self-pay | Admitting: Family Medicine

## 2023-10-01 LAB — COLOGUARD: COLOGUARD: NEGATIVE

## 2023-10-01 MED ORDER — PANTOPRAZOLE SODIUM 40 MG PO TBEC: 40.0000 mg | DELAYED_RELEASE_TABLET | Freq: Every day | ORAL | 3 refills | Status: AC

## 2023-11-11 ENCOUNTER — Encounter: Payer: Self-pay | Admitting: Internal Medicine

## 2024-01-14 ENCOUNTER — Ambulatory Visit: Admitting: Physician Assistant

## 2024-01-14 ENCOUNTER — Other Ambulatory Visit (INDEPENDENT_AMBULATORY_CARE_PROVIDER_SITE_OTHER): Payer: Self-pay

## 2024-01-14 DIAGNOSIS — M1712 Unilateral primary osteoarthritis, left knee: Secondary | ICD-10-CM

## 2024-01-14 DIAGNOSIS — G8929 Other chronic pain: Secondary | ICD-10-CM

## 2024-01-14 DIAGNOSIS — M25562 Pain in left knee: Secondary | ICD-10-CM

## 2024-01-14 MED ORDER — TRAMADOL HCL 50 MG PO TABS: 50.0000 mg | ORAL_TABLET | Freq: Two times a day (BID) | ORAL | 2 refills | Status: DC | PRN

## 2024-01-14 NOTE — Progress Notes (Signed)
 Office Visit Note   Patient: Cristina Sawyer           Date of Birth: [birth year]           MRN: [id number] Visit Date: 01/14/2024              Requested by: Watson Hacking, MD [address] [place],  [place] [address] PCP: Watson Hacking, MD   Assessment & Plan: Visit Diagnoses:  1. Unilateral primary osteoarthritis, left knee     Plan: Impression is severe left knee degenerative joint disease secondary to Osteoarthritis.  Bone on bone joint space narrowing is seen on radiographs with neutral alignment.  At this point, conservative treatments fail to provide any significant relief and the pain is severely affecting ADLs and quality of life.  Based on treatment options, the patient has elected to move forward with a knee replacement.  We have discussed the surgical risks that include but are not limited to infection, DVT, leg length discrepancy, stiffness, numbness, tingling, incomplete relief of pain.  Recovery and prognosis were also reviewed.    Anticoagulants: aspirin  81 mg daily Postop anticoagulation: Eliquis Diabetic: No  Nickel allergy: No Prior DVT/PE: No Tobacco use: No Clearances needed for surgery: pcp- Dr. Ron Cobbs Anticipated discharge dispo: Home   Follow-Up Instructions: Return for po.   Orders:  Orders Placed This Encounter  Procedures   XR KNEE 3 VIEW LEFT   No orders of the defined types were placed in this encounter.     Procedures: No procedures performed   Clinical Data: No additional findings.   Subjective: Chief Complaint  Patient presents with   Left Knee - Pain    HPI patient is a very pleasant 83 year old female who comes in today with chronic left knee pain which worsens about a week ago.  She notes that a week prior to the worsening of her symptoms she hit her knee on a hard surface.  She has since had increased pain to the entire knee worse with knee flexion.  She has been taking NSAIDs without relief.  No previous  cortisone injection to the left knee.  Of note, she is status post right total knee replacement 12/23/2022 and is doing great.  Review of Systems as detailed in HPI.  All others reviewed and are negative.   Objective: Vital Signs: There were no vitals taken for this visit.  Physical Exam well-developed well-nourished female in no acute distress.  Alert and oriented x 3.  Ortho Exam left knee exam: Small effusion.  Range of motion 0 to 95 degrees.  Medial and lateral joint line tenderness.  She is neurovascularly intact distally.  Specialty Comments:  No specialty comments available.  Imaging: XR KNEE 3 VIEW LEFT Result Date: 01/14/2024 X-rays demonstrate advanced tricompartmental degenerative changes.  No acute fracture noted.    PMFS History: Patient Active Problem List   Diagnosis Date Noted   Status post total right knee replacement 12/23/2022   Primary osteoarthritis of right knee 12/22/2022   History of GI bleed 06/06/2022   Osteopenia 08/30/2021   Aortic atherosclerosis (HCC) 07/12/2021   Diverticulosis 07/12/2021   GAD (generalized anxiety disorder) 07/05/2021   Personal history of calcium  pyrophosphate deposition disease (CPPD) 04/11/2020   History of vitamin D  deficiency 07/26/2019   Age-related cataract of both eyes 05/19/2018   Hyperlipidemia 05/19/2018   CKD (chronic kidney disease) stage 3, GFR 30-59 ml/min (HCC) 02/14/2018   Essential hypertension 05/12/2017   Transient cerebral ischemia 05/12/2017  Seasonal allergic rhinitis due to pollen 12/29/2014   GERD (gastroesophageal reflux disease) 01/17/2014   History of colonic polyps 08/09/2008   Past Medical History:  Diagnosis Date   Arthritis    Cataract    Chronic kidney disease    Diverticulosis    Dyslipidemia    GERD (gastroesophageal reflux disease)    HTN (hypertension) 2015   Personal history of colonic adenoma 08/09/2008   Vitamin D  deficiency     Family History  Problem Relation Age of Onset    Heart disease Sister    Heart disease Sister        in her 59s   Colon cancer Neg Hx     Past Surgical History:  Procedure Laterality Date   ABDOMINAL HYSTERECTOMY  1975   APPENDECTOMY  1975   COLONOSCOPY     HAND SURGERY     Cyst removal   THYROID  SURGERY  2000   to remove nodule   TOTAL KNEE ARTHROPLASTY Right 12/23/2022   Procedure: RIGHT TOTAL KNEE ARTHROPLASTY;  Surgeon: Wes Hamman, MD;  Location: [facility] OR;  Service: Orthopedics;  Laterality: Right;   Social History   Occupational History   Occupation: Retired from Art therapist  Tobacco Use   Smoking status: Never   Smokeless tobacco: Never  Vaping Use   Vaping status: Never Used  Substance and Sexual Activity   Alcohol  use: Yes    Alcohol /week: 1.0 standard drink of alcohol     Types: 1 Glasses of wine per week    Comment: socially   Drug use: No   Sexual activity: Yes

## 2024-01-14 NOTE — Addendum Note (Signed)
 Addended by: Sandie Cross on: 01/14/2024 09:35 AM   Modules accepted: Orders

## 2024-02-05 ENCOUNTER — Telehealth: Payer: Self-pay | Admitting: Orthopaedic Surgery

## 2024-02-05 NOTE — Telephone Encounter (Signed)
 Patient would like to schedule LEFT TKA in July.  She has asked we hold a date of 03-29-24 and call her if something comes available sooner.  Please provide surgery sheet if surgery is in order.

## 2024-02-25 ENCOUNTER — Encounter: Payer: Self-pay | Admitting: Family Medicine

## 2024-02-25 ENCOUNTER — Ambulatory Visit (INDEPENDENT_AMBULATORY_CARE_PROVIDER_SITE_OTHER): Admitting: Family Medicine

## 2024-02-25 VITALS — BP 120/70 | HR 84 | Wt 187.2 lb

## 2024-02-25 DIAGNOSIS — I10 Essential (primary) hypertension: Secondary | ICD-10-CM | POA: Diagnosis not present

## 2024-02-25 DIAGNOSIS — Z96651 Presence of right artificial knee joint: Secondary | ICD-10-CM | POA: Diagnosis not present

## 2024-02-25 DIAGNOSIS — M1990 Unspecified osteoarthritis, unspecified site: Secondary | ICD-10-CM

## 2024-02-25 DIAGNOSIS — Z01818 Encounter for other preprocedural examination: Secondary | ICD-10-CM

## 2024-02-25 NOTE — Progress Notes (Signed)
 6  Subjective:    Patient ID: Cristina Sawyer, female    DOB: 1941-06-02, 83 y.o.   MRN: [id number]  HPI She is here for presurgical clearance.  She has had the right TKR and is now scheduled for the left TKR in July. She does have hypertension is also had TIA.  She is on appropriate medication and is having no problem with that.  She has had no chest pain, shortness of breath or pulmonary issues.  Review of Systems     Objective:    Physical Exam Alert and in no distress. Tympanic membranes and canals are normal. Pharyngeal area is normal. Neck is supple without adenopathy or thyromegaly. Cardiac exam shows a regular sinus rhythm without murmurs or gallops. Lungs are clear to auscultation.        Assessment & Plan:   Pre-op exam  Status post total right knee replacement  Essential hypertension  Arthritis Discussed the importance of proper rehab after the surgery to get back to normal as quickly as possible.

## 2024-03-15 NOTE — Pre-Procedure Instructions (Signed)
 Surgical Instructions   Your procedure is scheduled on Monday, March 29, 2024. Report to [facility] Main Entrance A at 7:45 A.M., then check in with the Admitting office. Any questions or running late day of surgery: call [phone]  Questions prior to your surgery date: call [phone], Monday-Friday, 8am-4pm. If you experience any cold or flu symptoms such as cough, fever, chills, shortness of breath, etc. between now and your scheduled surgery, please notify us  at the above number.     Remember:  Do not eat after midnight the night before your surgery  You may drink clear liquids until 7:15am the morning of your surgery.   Clear liquids allowed are: Water, Non-Citrus Juices (without pulp), Carbonated Beverages, Clear Tea (no milk, honey, etc.), Black Coffee Only (NO MILK, CREAM OR POWDERED CREAMER of any kind), and Gatorade.  Patient Instructions  The night before surgery:  No food after midnight. ONLY clear liquids after midnight  The day of surgery:  Drink ONE (1) Pre-Surgery Clear Ensure by 7:15am the morning of surgery. Drink in one sitting. Do not sip.  This drink was given to you during your hospital  pre-op appointment visit.  Nothing else to drink after completing the  Pre-Surgery Clear Ensure.          If you have questions, please contact your surgeon's office.     Take these medicines the morning of surgery with A SIP OF WATER : Amlodipine  (Norvasc ) Rosuvastatin  (Crestor )  May take these medicines IF NEEDED: Pantoprazole  (Protonix ) Polyethyl Glycol-Propyl Glycol (Systane) eye drops  One week prior to surgery, STOP taking any Aspirin  (unless otherwise instructed by your surgeon) Aleve, Naproxen, Ibuprofen , Motrin , Advil , Goody's, BC's, all herbal medications, fish oil, and non-prescription vitamins.                     Do NOT Smoke (Tobacco/Vaping) for 24 hours prior to your procedure.  If you use a CPAP at night, you may bring your mask/headgear for  your overnight stay.   You will be asked to remove any contacts, glasses, piercing's, hearing aid's, dentures/partials prior to surgery. Please bring cases for these items if needed.    Patients discharged the day of surgery will not be allowed to drive home, and someone needs to stay with them for 24 hours.  SURGICAL WAITING ROOM VISITATION Patients may have no more than 2 support people in the waiting area - these visitors may rotate.   Pre-op nurse will coordinate an appropriate time for 1 ADULT support person, who may not rotate, to accompany patient in pre-op.  Children under the age of 53 must have an adult with them who is not the patient and must remain in the main waiting area with an adult.  If the patient needs to stay at the hospital during part of their recovery, the visitor guidelines for inpatient rooms apply.  Please refer to the [facility] website for the visitor guidelines for any additional information.   If you received a COVID test during your pre-op visit  it is requested that you wear a mask when out in public, stay away from anyone that may not be feeling well and notify your surgeon if you develop symptoms. If you have been in contact with anyone that has tested positive in the last 10 days please notify you surgeon.      Pre-operative 5 CHG Bathing Instructions   You can play a key role in reducing the risk of infection after  surgery. Your skin needs to be as free of germs as possible. You can reduce the number of germs on your skin by washing with CHG (chlorhexidine  gluconate) soap before surgery. CHG is an antiseptic soap that kills germs and continues to kill germs even after washing.   DO NOT use if you have an allergy to chlorhexidine /CHG or antibacterial soaps. If your skin becomes reddened or irritated, stop using the CHG and notify one of our RNs at [phone].   Please shower with the CHG soap starting 4 days before surgery using the following  schedule:     Please keep in mind the following:  DO NOT shave, including legs and underarms, starting the day of your first shower.   You may shave your face at any point before/day of surgery.  Place clean sheets on your bed the day you start using CHG soap. Use a clean washcloth (not used since being washed) for each shower. DO NOT sleep with pets once you start using the CHG.   CHG Shower Instructions:  Wash your face and private area with normal soap. If you choose to wash your hair, wash first with your normal shampoo.  After you use shampoo/soap, rinse your hair and body thoroughly to remove shampoo/soap residue.  Turn the water OFF and apply about 3 tablespoons (45 ml) of CHG soap to a CLEAN washcloth.  Apply CHG soap ONLY FROM YOUR NECK DOWN TO YOUR TOES (washing for 3-5 minutes)  DO NOT use CHG soap on face, private areas, open wounds, or sores.  Pay special attention to the area where your surgery is being performed.  If you are having back surgery, having someone wash your back for you may be helpful. Wait 2 minutes after CHG soap is applied, then you may rinse off the CHG soap.  Pat dry with a clean towel  Put on clean clothes/pajamas   If you choose to wear lotion, please use ONLY the CHG-compatible lotions that are listed below.  Additional instructions for the day of surgery: DO NOT APPLY any lotions, deodorants, cologne, or perfumes.   Do not bring valuables to the hospital. [facility] is not responsible for any belongings/valuables. Do not wear nail polish, gel polish, artificial nails, or any other type of covering on natural nails (fingers and toes) Do not wear jewelry or makeup Put on clean/comfortable clothes.  Please brush your teeth.  Ask your nurse before applying any prescription medications to the skin.     CHG Compatible Lotions   Aveeno Moisturizing lotion  Cetaphil Moisturizing Cream  Cetaphil Moisturizing Lotion  Clairol Herbal Essence  Moisturizing Lotion, Dry Skin  Clairol Herbal Essence Moisturizing Lotion, Extra Dry Skin  Clairol Herbal Essence Moisturizing Lotion, Normal Skin  Curel Age Defying Therapeutic Moisturizing Lotion with Alpha Hydroxy  Curel Extreme Care Body Lotion  Curel Soothing Hands Moisturizing Hand Lotion  Curel Therapeutic Moisturizing Cream, Fragrance-Free  Curel Therapeutic Moisturizing Lotion, Fragrance-Free  Curel Therapeutic Moisturizing Lotion, Original Formula  Eucerin Daily Replenishing Lotion  Eucerin Dry Skin Therapy Plus Alpha Hydroxy Crme  Eucerin Dry Skin Therapy Plus Alpha Hydroxy Lotion  Eucerin Original Crme  Eucerin Original Lotion  Eucerin Plus Crme Eucerin Plus Lotion  Eucerin TriLipid Replenishing Lotion  Keri Anti-Bacterial Hand Lotion  Keri Deep Conditioning Original Lotion Dry Skin Formula Softly Scented  Keri Deep Conditioning Original Lotion, Fragrance Free Sensitive Skin Formula  Keri Lotion Fast Absorbing Fragrance Free Sensitive Skin Formula  Keri Lotion Fast Absorbing Softly Scented Dry  Skin Formula  Keri Original Lotion  Keri Skin Renewal Lotion Keri Silky Smooth Lotion  Keri Silky Smooth Sensitive Skin Lotion  Nivea Body Creamy Conditioning Oil  Nivea Body Extra Enriched Lotion  Nivea Body Original Lotion  Nivea Body Sheer Moisturizing Lotion Nivea Crme  Nivea Skin Firming Lotion  NutraDerm 30 Skin Lotion  NutraDerm Skin Lotion  NutraDerm Therapeutic Skin Cream  NutraDerm Therapeutic Skin Lotion  ProShield Protective Hand Cream  Provon moisturizing lotion  Please read over the following fact sheets that you were given.

## 2024-03-16 ENCOUNTER — Other Ambulatory Visit (HOSPITAL_COMMUNITY)

## 2024-03-16 ENCOUNTER — Encounter (HOSPITAL_COMMUNITY): Payer: Self-pay

## 2024-03-16 ENCOUNTER — Encounter (HOSPITAL_COMMUNITY)
Admission: RE | Admit: 2024-03-16 | Discharge: 2024-03-16 | Disposition: A | Discharge status: Home / Self Care | Source: Ambulatory Visit | Attending: Orthopaedic Surgery | Admitting: Orthopaedic Surgery

## 2024-03-16 ENCOUNTER — Other Ambulatory Visit: Payer: Self-pay

## 2024-03-16 ENCOUNTER — Telehealth: Payer: Self-pay | Admitting: Orthopaedic Surgery

## 2024-03-16 VITALS — BP 128/57 | HR 70 | Temp 98.4°F | Resp 17 | Ht 63.0 in | Wt 188.0 lb

## 2024-03-16 DIAGNOSIS — R9431 Abnormal electrocardiogram [ECG] [EKG]: Secondary | ICD-10-CM | POA: Insufficient documentation

## 2024-03-16 DIAGNOSIS — K219 Gastro-esophageal reflux disease without esophagitis: Secondary | ICD-10-CM | POA: Diagnosis not present

## 2024-03-16 DIAGNOSIS — M1712 Unilateral primary osteoarthritis, left knee: Secondary | ICD-10-CM | POA: Insufficient documentation

## 2024-03-16 DIAGNOSIS — N189 Chronic kidney disease, unspecified: Secondary | ICD-10-CM | POA: Insufficient documentation

## 2024-03-16 DIAGNOSIS — Z01818 Encounter for other preprocedural examination: Secondary | ICD-10-CM | POA: Diagnosis not present

## 2024-03-16 DIAGNOSIS — Z9889 Other specified postprocedural states: Secondary | ICD-10-CM | POA: Diagnosis not present

## 2024-03-16 DIAGNOSIS — I129 Hypertensive chronic kidney disease with stage 1 through stage 4 chronic kidney disease, or unspecified chronic kidney disease: Secondary | ICD-10-CM | POA: Diagnosis not present

## 2024-03-16 DIAGNOSIS — I10 Essential (primary) hypertension: Secondary | ICD-10-CM

## 2024-03-16 DIAGNOSIS — Z7982 Long term (current) use of aspirin: Secondary | ICD-10-CM | POA: Insufficient documentation

## 2024-03-16 DIAGNOSIS — Z96651 Presence of right artificial knee joint: Secondary | ICD-10-CM | POA: Diagnosis not present

## 2024-03-16 DIAGNOSIS — E785 Hyperlipidemia, unspecified: Secondary | ICD-10-CM | POA: Insufficient documentation

## 2024-03-16 LAB — CBC
HCT: 45.8 % (ref 36.0–46.0)
Hemoglobin: 14.6 g/dL (ref 12.0–15.0)
MCH: 27.7 pg (ref 26.0–34.0)
MCHC: 31.9 g/dL (ref 30.0–36.0)
MCV: 86.9 fL (ref 80.0–100.0)
Platelets: 238 10*3/uL (ref 150–400)
RBC: 5.27 MIL/uL — ABNORMAL HIGH (ref 3.87–5.11)
RDW: 14.3 % (ref 11.5–15.5)
WBC: 8.9 10*3/uL (ref 4.0–10.5)
nRBC: 0 % (ref 0.0–0.2)

## 2024-03-16 LAB — SURGICAL PCR SCREEN
MRSA, PCR: NEGATIVE
Staphylococcus aureus: NEGATIVE

## 2024-03-16 LAB — BASIC METABOLIC PANEL WITH GFR
Anion gap: 10 (ref 5–15)
BUN: 12 mg/dL (ref 8–23)
CO2: 27 mmol/L (ref 22–32)
Calcium: 9.6 mg/dL (ref 8.9–10.3)
Chloride: 101 mmol/L (ref 98–111)
Creatinine, Ser: 1.15 mg/dL — ABNORMAL HIGH (ref 0.44–1.00)
GFR, Estimated: 47 mL/min — ABNORMAL LOW (ref >60)
Glucose, Bld: 103 mg/dL — ABNORMAL HIGH (ref 70–99)
Potassium: 4.4 mmol/L (ref 3.5–5.1)
Sodium: 138 mmol/L (ref 135–145)

## 2024-03-16 NOTE — Telephone Encounter (Signed)
 Patient needs to know if she should stop her low dose aspirin  a week before surgery and also pt request a handicap form.     Pt's call back [phone]

## 2024-03-16 NOTE — Progress Notes (Signed)
 PCP - Joyce Rush, MD Cardiologist - denies  PPM/ICD - denies Device Orders - n/a Rep Notified - n/a  Chest x-ray - 09/11/2021 EKG - 07/01/20025 Stress Test - 02/15/2018 ECHO - 02/15/2018 Cardiac Cath - denies  Sleep Study - denies CPAP - n/a  Fasting Blood Sugar - no DM Checks Blood Sugar _____ times a day  Last dose of GLP1 agonist-  n/a GLP1 instructions: n/a  Blood Thinner Instructions: n/a Aspirin  Instructions: follow surgeon's instructions   ERAS Protcol - yes, till 0715 am PRE-SURGERY Ensure or G2-yes,  Ensure  COVID TEST- n/a   Anesthesia review: yes, cardiac studies, hx of HTN, EKG needs to be reviewed.   Patient denies shortness of breath, fever, cough and chest pain at PAT appointment   All instructions explained to the patient, with a verbal understanding of the material. Patient agrees to go over the instructions while at home for a better understanding. Patient also instructed to self quarantine after being tested for COVID-19. The opportunity to ask questions was provided.

## 2024-03-16 NOTE — Telephone Encounter (Signed)
 Called and spoke with patient. She is aware that she does NOT need to discontinue her aspirin . I will place handicap application up front for pick up.

## 2024-03-16 NOTE — Telephone Encounter (Signed)
She can continue

## 2024-03-17 NOTE — Anesthesia Preprocedure Evaluation (Addendum)
 Anesthesia Evaluation  Patient identified by MRN, date of birth, ID band Patient awake    Reviewed: Allergy & Precautions, NPO status , Patient's Chart, lab work & pertinent test results  History of Anesthesia Complications Negative for: history of anesthetic complications  Airway Mallampati: II  TM Distance: >3 FB Neck ROM: Full   Comment: Tongue deviates slightly left when sticking out Dental  (+) Dental Advisory Given,    Pulmonary neg pulmonary ROS, neg shortness of breath, neg sleep apnea, neg COPD, neg recent URI   Pulmonary exam normal breath sounds clear to auscultation       Cardiovascular hypertension (amlodipine ), Pt. on medications (-) angina (-) Past MI, (-) Cardiac Stents and (-) CABG (-) dysrhythmias  Rhythm:Regular Rate:Normal  HLD  TTE 02/15/2018: Study Conclusions   - Left ventricle: The cavity size was normal. There was mild focal    basal hypertrophy of the septum. Systolic function was normal.    The estimated ejection fraction was in the range of 60% to 65%.    Wall motion was normal; there were no regional wall motion    abnormalities. There was an increased relative contribution of    atrial contraction to ventricular filling. Doppler parameters are    consistent with abnormal left ventricular relaxation (grade 1    diastolic dysfunction).  - Aortic valve: Trileaflet; mildly thickened, mildly calcified    leaflets. There was trivial regurgitation.  - Tricuspid valve: There was trivial regurgitation.  - Pulmonary arteries: Systolic pressure could not be accurately    estimated.   Low-risk stress test 02/15/2018    Neuro/Psych neg Seizures PSYCHIATRIC DISORDERS Anxiety     TIA (possible)   GI/Hepatic Neg liver ROS,GERD  Medicated,,Diverticulosis    Endo/Other  negative endocrine ROS    Renal/GU CRFRenal disease     Musculoskeletal  (+) Arthritis , Osteoarthritis,  Osteopenia    Abdominal   (+) + obese  Peds  Hematology negative hematology ROS (+) Lab Results      Component                Value               Date                      WBC                      8.9                 03/16/2024                HGB                      14.6                03/16/2024                HCT                      45.8                03/16/2024                MCV                      86.9                03/16/2024  PLT                      238                 03/16/2024              Anesthesia Other Findings   Reproductive/Obstetrics                              Anesthesia Physical Anesthesia Plan  ASA: 3  Anesthesia Plan: MAC and Spinal   Post-op Pain Management: Regional block* and Tylenol  PO (pre-op)*   Induction: Intravenous  PONV Risk Score and Plan: 2 and Ondansetron , Dexamethasone , Propofol  infusion, TIVA and Treatment may vary due to age or medical condition  Airway Management Planned: Natural Airway and Simple Face Mask  Additional Equipment:   Intra-op Plan:   Post-operative Plan:   Informed Consent: I have reviewed the patients History and Physical, chart, labs and discussed the procedure including the risks, benefits and alternatives for the proposed anesthesia with the patient or authorized representative who has indicated his/her understanding and acceptance.     Dental advisory given  Plan Discussed with: CRNA and Anesthesiologist  Anesthesia Plan Comments: (PAT note written 03/17/2024 by Allison Zelenak, PA-C.  Discussed potential risks of nerve blocks including, but not limited to, infection, bleeding, nerve damage, seizures, pneumothorax, respiratory depression, and potential failure of the block. Alternatives to nerve blocks discussed. All questions answered.  I have discussed risks of neuraxial anesthesia including but not limited to infection, bleeding, nerve injury, back pain, headache, seizures, and failure of  block. Patient denies bleeding disorders and is not currently anticoagulated. Labs have been reviewed. Risks and benefits discussed. All patient's questions answered.   Discussed with patient risks of MAC including, but not limited to, minor pain or discomfort, hearing people in the room, and possible need for backup general anesthesia. Risks for general anesthesia also discussed including, but not limited to, sore throat, hoarse voice, chipped/damaged teeth, injury to vocal cords, nausea and vomiting, allergic reactions, lung infection, heart attack, stroke, and death. All questions answered.  )         Anesthesia Quick Evaluation

## 2024-03-17 NOTE — Progress Notes (Signed)
 Anesthesia Chart Review:  Case: [id number] Date/Time: 03/29/24 1000   Procedure: ARTHROPLASTY, KNEE, TOTAL (Left: Knee)   Anesthesia type: Spinal   Diagnosis: Primary osteoarthritis of left knee [M17.12]   Pre-op diagnosis: left knee osteoarthritis   [facility]: [facility] ROOM 09 / [facility]   Surgeons: Jerri Kay HERO, MD       DISCUSSION: Patient is an 83 year old female scheduled for the above procedure.  History includes never smoker, HTN, dyslipidemia, GERD, TIA versus complicated migraine (05/02/17), CKD, osteoarthritis (right TKA 4/824), left thyroid  nodule (s/p left thyroid  lobectomy, isthmectomy 04/04/05 with benign patholoy), left breat intraductal papilloma (s/p excision 04/04/05).   She had preoperative evaluation by Dr. Joyce on 02/25/2024.  He classified her as low risk for planned procedure.  Preoperative labs and EKG reviewed and stable from prior. Cr 1.15. She can continue ASA per Dr. Jerri.  Anesthesia team to evaluate on the day of surgery.  VS: BP (!) 128/57   Pulse 70   Temp 36.9 C   Resp 17   Ht 5' 3 (1.6 m)   Wt 85.3 kg   SpO2 97%   BMI 33.30 kg/m   PROVIDERS: Joyce Norleen BROCKS, MD is PCP  -He is not followed routinely by cardiology but had unremarkable stress test, echocardiogram, and long-term event monitor for evaluation of chest pain and palpitations in 2019 by Shlomo Corning, MD.   LABS: Labs reviewed: Acceptable for surgery. (all labs ordered are listed, but only abnormal results are displayed)  Labs Reviewed  BASIC METABOLIC PANEL WITH GFR - Abnormal; Notable for the following components:      Result Value   Glucose, Bld 103 (*)    Creatinine, Ser 1.15 (*)    GFR, Estimated 47 (*)    All other components within normal limits  CBC - Abnormal; Notable for the following components:   RBC 5.27 (*)    All other components within normal limits  SURGICAL PCR SCREEN     IMAGES: Xray left knee 01/14/24: X-rays demonstrate advanced tricompartmental degenerative  changes.  No  acute fracture noted.    EKG: 03/16/24: Normal sinus rhythm Nonspecific T wave abnormality Abnormal ECG When compared with ECG of 12-Dec-2022 13:27, No significant change since last tracing Minimal criteria for Myocardial infarction NOT PRESENT Confirmed by Anner Lenis ([id number]) on 03/16/2024 3:06:07 PM   CV: Long term event monitor 02/23/18 - 03/24/18: Sinus bradycardia, normal sinus rhythm and sinus tachycardia with average heart rate 67 bpm. Rate ranged from 55 to 142 bpm.   Echo 02/15/18: Study Conclusions  - Left ventricle: The cavity size was normal. There was mild focal    basal hypertrophy of the septum. Systolic function was normal.    The estimated ejection fraction was in the range of 60% to 65%.    Wall motion was normal; there were no regional wall motion    abnormalities. There was an increased relative contribution of    atrial contraction to ventricular filling. Doppler parameters are    consistent with abnormal left ventricular relaxation (grade 1    diastolic dysfunction).  - Aortic valve: Trileaflet; mildly thickened, mildly calcified    leaflets. There was trivial regurgitation.  - Tricuspid valve: There was trivial regurgitation.  - Pulmonary arteries: Systolic pressure could not be accurately    estimated.    Nuclear stress test 02/15/18: IMPRESSION: 1. No reversible ischemia or infarction. 2. Normal left ventricular wall motion. 3. Left ventricular ejection fraction 73% 4. Non invasive risk stratification*: Low  US  Carotid 05/03/17: Summary:  - The vertebral arteries appear patent with antegrade flow.  - Findings consistent with a 1-39 percent stenosis involving the    right internal carotid artery and the left internal carotid    artery. Difficult evaluation of plaque bilaterally due to depth    of vessels and acoustic shadowing.    Past Medical History:  Diagnosis Date   Arthritis    Cataract    Chronic kidney disease     Diverticulosis    Dyslipidemia    GERD (gastroesophageal reflux disease)    HTN (hypertension) 2015   Personal history of colonic adenoma 08/09/2008   Vitamin D  deficiency     Past Surgical History:  Procedure Laterality Date   ABDOMINAL HYSTERECTOMY  1975   APPENDECTOMY  1975   COLONOSCOPY     HAND SURGERY     Cyst removal   THYROID  SURGERY  2000   to remove nodule   TOTAL KNEE ARTHROPLASTY Right 12/23/2022   Procedure: RIGHT TOTAL KNEE ARTHROPLASTY;  Surgeon: Jerri Kay HERO, MD;  Location: [facility];  Service: Orthopedics;  Laterality: Right;    MEDICATIONS:  acetaminophen  (TYLENOL ) 500 MG tablet   ibuprofen  (ADVIL ) 400 MG tablet   amLODipine  (NORVASC ) 10 MG tablet   aspirin  EC 81 MG tablet   cyanocobalamin (VITAMIN B12) 500 MCG tablet   methocarbamol  (ROBAXIN ) 500 MG tablet   Multiple Vitamin (MULTI-VITAMIN DAILY PO)   ondansetron  (ZOFRAN ) 4 MG tablet   oxyCODONE -acetaminophen  (PERCOCET) 5-325 MG tablet   pantoprazole  (PROTONIX ) 40 MG tablet   Polyethyl Glycol-Propyl Glycol (SYSTANE OP)   rosuvastatin  (CRESTOR ) 5 MG tablet   traMADol  (ULTRAM ) 50 MG tablet   VITAMIN D  PO   No current facility-administered medications for this encounter.    Isaiah Ruder, PA-C Surgical Short Stay/Anesthesiology [facility] Phone [phone] [facility] Phone [phone] 03/17/2024 6:30 PM

## 2024-03-18 ENCOUNTER — Other Ambulatory Visit: Payer: Self-pay | Admitting: Physician Assistant

## 2024-03-18 MED ORDER — OXYCODONE-ACETAMINOPHEN 5-325 MG PO TABS: 1.0000 | ORAL_TABLET | Freq: Four times a day (QID) | ORAL | 0 refills | Status: DC | PRN

## 2024-03-18 MED ORDER — DOCUSATE SODIUM 100 MG PO CAPS: 100.0000 mg | ORAL_CAPSULE | Freq: Every day | ORAL | 2 refills | Status: DC | PRN

## 2024-03-18 MED ORDER — ONDANSETRON HCL 4 MG PO TABS: 4.0000 mg | ORAL_TABLET | Freq: Three times a day (TID) | ORAL | 0 refills | Status: DC | PRN

## 2024-03-18 MED ORDER — METHOCARBAMOL 500 MG PO TABS: 500.0000 mg | ORAL_TABLET | Freq: Two times a day (BID) | ORAL | 2 refills | Status: DC | PRN

## 2024-03-18 MED ORDER — ASPIRIN 81 MG PO CHEW: 81.0000 mg | CHEWABLE_TABLET | Freq: Two times a day (BID) | ORAL | 0 refills | Status: DC

## 2024-03-26 ENCOUNTER — Other Ambulatory Visit: Payer: Self-pay | Admitting: *Deleted

## 2024-03-26 ENCOUNTER — Telehealth: Payer: Self-pay | Admitting: *Deleted

## 2024-03-26 DIAGNOSIS — M1712 Unilateral primary osteoarthritis, left knee: Secondary | ICD-10-CM

## 2024-03-26 NOTE — Care Plan (Signed)
 [facility] RNCM call to patient to discuss her upcoming Left total knee arthroplasty with Dr. Jerri at [facility] on 03/29/24. She lives alone, but has several nieces that will be coming to help her as needed at discharge. She has a RW already and [employer] has dropped off her home CPM already prior to surgery. Anticipate HHPT will be needed after a short hospital stay. Referral made to [facility] after choice provided. Reviewed post op care instructions and will continue to follow for needs.

## 2024-03-26 NOTE — Telephone Encounter (Signed)
 [facility] RNCM pre-op  call completed.

## 2024-03-28 DIAGNOSIS — M1712 Unilateral primary osteoarthritis, left knee: Secondary | ICD-10-CM | POA: Insufficient documentation

## 2024-03-29 ENCOUNTER — Encounter (HOSPITAL_COMMUNITY): Payer: Self-pay | Admitting: Orthopaedic Surgery

## 2024-03-29 ENCOUNTER — Observation Stay (HOSPITAL_COMMUNITY)

## 2024-03-29 ENCOUNTER — Other Ambulatory Visit: Payer: Self-pay

## 2024-03-29 ENCOUNTER — Encounter (HOSPITAL_COMMUNITY)
Admission: RE | Disposition: A | Discharge status: Home / Self Care | Payer: Self-pay | Source: Home / Self Care | Attending: Orthopaedic Surgery

## 2024-03-29 ENCOUNTER — Ambulatory Visit (HOSPITAL_COMMUNITY): Payer: Self-pay | Admitting: Vascular Surgery

## 2024-03-29 ENCOUNTER — Ambulatory Visit (HOSPITAL_BASED_OUTPATIENT_CLINIC_OR_DEPARTMENT_OTHER): Payer: Self-pay | Admitting: Anesthesiology

## 2024-03-29 ENCOUNTER — Observation Stay (HOSPITAL_COMMUNITY)
Admission: RE | Admit: 2024-03-29 | Discharge: 2024-03-30 | Disposition: A | Discharge status: Home / Self Care | Attending: Orthopaedic Surgery | Admitting: Orthopaedic Surgery

## 2024-03-29 DIAGNOSIS — Z79899 Other long term (current) drug therapy: Secondary | ICD-10-CM | POA: Insufficient documentation

## 2024-03-29 DIAGNOSIS — Z96652 Presence of left artificial knee joint: Secondary | ICD-10-CM | POA: Diagnosis not present

## 2024-03-29 DIAGNOSIS — Z471 Aftercare following joint replacement surgery: Secondary | ICD-10-CM | POA: Diagnosis not present

## 2024-03-29 DIAGNOSIS — N189 Chronic kidney disease, unspecified: Secondary | ICD-10-CM | POA: Insufficient documentation

## 2024-03-29 DIAGNOSIS — M1712 Unilateral primary osteoarthritis, left knee: Secondary | ICD-10-CM

## 2024-03-29 DIAGNOSIS — Z7982 Long term (current) use of aspirin: Secondary | ICD-10-CM | POA: Diagnosis not present

## 2024-03-29 DIAGNOSIS — G8918 Other acute postprocedural pain: Secondary | ICD-10-CM | POA: Diagnosis not present

## 2024-03-29 DIAGNOSIS — F419 Anxiety disorder, unspecified: Secondary | ICD-10-CM | POA: Diagnosis not present

## 2024-03-29 DIAGNOSIS — Z96651 Presence of right artificial knee joint: Secondary | ICD-10-CM | POA: Diagnosis not present

## 2024-03-29 DIAGNOSIS — I129 Hypertensive chronic kidney disease with stage 1 through stage 4 chronic kidney disease, or unspecified chronic kidney disease: Secondary | ICD-10-CM | POA: Diagnosis not present

## 2024-03-29 DIAGNOSIS — N183 Chronic kidney disease, stage 3 unspecified: Secondary | ICD-10-CM | POA: Diagnosis not present

## 2024-03-29 DIAGNOSIS — M85862 Other specified disorders of bone density and structure, left lower leg: Secondary | ICD-10-CM | POA: Diagnosis not present

## 2024-03-29 HISTORY — PX: TOTAL KNEE ARTHROPLASTY: SHX125

## 2024-03-29 SURGERY — ARTHROPLASTY, KNEE, TOTAL
Anesthesia: Monitor Anesthesia Care | Site: Knee | Laterality: Left

## 2024-03-29 MED ORDER — PROPOFOL 10 MG/ML IV BOLUS
INTRAVENOUS | Status: AC
  Filled 2024-03-29: qty 20

## 2024-03-29 MED ORDER — LACTATED RINGERS IV SOLN: INTRAVENOUS | Status: DC

## 2024-03-29 MED ORDER — ACETAMINOPHEN 500 MG PO TABS
1000.0000 mg | ORAL_TABLET | Freq: Four times a day (QID) | ORAL | Status: DC
Start: 2024-03-29 — End: 2024-03-30
  Administered 2024-03-29 – 2024-03-30 (×3): 1000 mg via ORAL
  Filled 2024-03-29 (×3): qty 2

## 2024-03-29 MED ORDER — SUGAMMADEX SODIUM 200 MG/2ML IV SOLN
INTRAVENOUS | Status: AC
  Filled 2024-03-29: qty 2

## 2024-03-29 MED ORDER — PHENYLEPHRINE HCL-NACL 20-0.9 MG/250ML-% IV SOLN
INTRAVENOUS | Status: DC | PRN
  Administered 2024-03-29: 10 ug/min via INTRAVENOUS

## 2024-03-29 MED ORDER — AMLODIPINE BESYLATE 10 MG PO TABS
10.0000 mg | ORAL_TABLET | Freq: Every day | ORAL | Status: DC
  Administered 2024-03-30: 10 mg via ORAL
  Filled 2024-03-29: qty 1

## 2024-03-29 MED ORDER — EPHEDRINE SULFATE-NACL 50-0.9 MG/10ML-% IV SOSY
PREFILLED_SYRINGE | INTRAVENOUS | Status: DC | PRN
  Administered 2024-03-29: 10 mg via INTRAVENOUS
  Administered 2024-03-29: 5 mg via INTRAVENOUS

## 2024-03-29 MED ORDER — OXYCODONE HCL ER 10 MG PO T12A
10.0000 mg | EXTENDED_RELEASE_TABLET | Freq: Two times a day (BID) | ORAL | Status: DC
  Administered 2024-03-29 (×2): 10 mg via ORAL
  Filled 2024-03-29 (×2): qty 1

## 2024-03-29 MED ORDER — TRANEXAMIC ACID-NACL 1000-0.7 MG/100ML-% IV SOLN
1000.0000 mg | INTRAVENOUS | Status: AC
  Administered 2024-03-29: 1000 mg via INTRAVENOUS
  Filled 2024-03-29: qty 100

## 2024-03-29 MED ORDER — TRANEXAMIC ACID 1000 MG/10ML IV SOLN
2000.0000 mg | INTRAVENOUS | Status: DC
  Filled 2024-03-29 (×2): qty 20

## 2024-03-29 MED ORDER — PROPOFOL 10 MG/ML IV BOLUS
INTRAVENOUS | Status: DC | PRN
  Administered 2024-03-29: 65 ug/kg/min via INTRAVENOUS
  Administered 2024-03-29: 40 mg via INTRAVENOUS
  Administered 2024-03-29: 20 mg via INTRAVENOUS

## 2024-03-29 MED ORDER — BUPIVACAINE IN DEXTROSE 0.75-8.25 % IT SOLN
INTRATHECAL | Status: DC | PRN
  Administered 2024-03-29: 1.6 mL via INTRATHECAL

## 2024-03-29 MED ORDER — DOCUSATE SODIUM 100 MG PO CAPS
100.0000 mg | ORAL_CAPSULE | Freq: Two times a day (BID) | ORAL | Status: DC
  Administered 2024-03-29 – 2024-03-30 (×3): 100 mg via ORAL
  Filled 2024-03-29 (×3): qty 1

## 2024-03-29 MED ORDER — TRANEXAMIC ACID 1000 MG/10ML IV SOLN
INTRAVENOUS | Status: DC | PRN
  Administered 2024-03-29: 2000 mg via TOPICAL

## 2024-03-29 MED ORDER — OXYCODONE HCL 5 MG/5ML PO SOLN: 5.0000 mg | Freq: Once | ORAL | Status: DC | PRN

## 2024-03-29 MED ORDER — ROPIVACAINE HCL 5 MG/ML IJ SOLN
INTRAMUSCULAR | Status: DC | PRN
  Administered 2024-03-29: 30 mL via PERINEURAL

## 2024-03-29 MED ORDER — FENTANYL CITRATE (PF) 100 MCG/2ML IJ SOLN
INTRAMUSCULAR | Status: AC
  Administered 2024-03-29: 100 ug via INTRAVENOUS
  Filled 2024-03-29: qty 2

## 2024-03-29 MED ORDER — CEFAZOLIN SODIUM-DEXTROSE 2-4 GM/100ML-% IV SOLN
2.0000 g | INTRAVENOUS | Status: AC
  Administered 2024-03-29: 2 g via INTRAVENOUS
  Filled 2024-03-29: qty 100

## 2024-03-29 MED ORDER — TRANEXAMIC ACID-NACL 1000-0.7 MG/100ML-% IV SOLN
1000.0000 mg | Freq: Once | INTRAVENOUS | Status: AC
  Administered 2024-03-29: 1000 mg via INTRAVENOUS
  Filled 2024-03-29: qty 100

## 2024-03-29 MED ORDER — VANCOMYCIN HCL 1000 MG IV SOLR
INTRAVENOUS | Status: DC | PRN
  Administered 2024-03-29: 1000 mg

## 2024-03-29 MED ORDER — OXYCODONE HCL 5 MG PO TABS
5.0000 mg | ORAL_TABLET | ORAL | Status: DC | PRN
  Administered 2024-03-29 – 2024-03-30 (×4): 5 mg via ORAL
  Filled 2024-03-29 (×4): qty 1

## 2024-03-29 MED ORDER — DEXAMETHASONE SODIUM PHOSPHATE 10 MG/ML IJ SOLN
INTRAMUSCULAR | Status: AC
Start: 2024-03-29 — End: 2024-03-29
  Filled 2024-03-29: qty 1

## 2024-03-29 MED ORDER — MENTHOL 3 MG MT LOZG: 1.0000 | LOZENGE | OROMUCOSAL | Status: DC | PRN

## 2024-03-29 MED ORDER — LIDOCAINE 2% (20 MG/ML) 5 ML SYRINGE
INTRAMUSCULAR | Status: AC
  Filled 2024-03-29: qty 5

## 2024-03-29 MED ORDER — ACETAMINOPHEN 500 MG PO TABS
1000.0000 mg | ORAL_TABLET | Freq: Once | ORAL | Status: AC
  Administered 2024-03-29: 1000 mg via ORAL
  Filled 2024-03-29: qty 2

## 2024-03-29 MED ORDER — DEXAMETHASONE SODIUM PHOSPHATE 10 MG/ML IJ SOLN
10.0000 mg | Freq: Once | INTRAMUSCULAR | Status: AC
  Administered 2024-03-30: 10 mg via INTRAVENOUS
  Filled 2024-03-29: qty 1

## 2024-03-29 MED ORDER — PHENYLEPHRINE 80 MCG/ML (10ML) SYRINGE FOR IV PUSH (FOR BLOOD PRESSURE SUPPORT)
PREFILLED_SYRINGE | INTRAVENOUS | Status: DC | PRN
  Administered 2024-03-29 (×2): 40 ug via INTRAVENOUS
  Administered 2024-03-29: 80 ug via INTRAVENOUS

## 2024-03-29 MED ORDER — ONDANSETRON HCL 4 MG/2ML IJ SOLN
INTRAMUSCULAR | Status: AC
Start: 2024-03-29 — End: 2024-03-29
  Filled 2024-03-29: qty 2

## 2024-03-29 MED ORDER — PHENYLEPHRINE HCL-NACL 20-0.9 MG/250ML-% IV SOLN
INTRAVENOUS | Status: AC
  Filled 2024-03-29: qty 250

## 2024-03-29 MED ORDER — BUPIVACAINE-MELOXICAM ER 400-12 MG/14ML IJ SOLN
INTRAMUSCULAR | Status: DC | PRN
  Administered 2024-03-29: 400 mg

## 2024-03-29 MED ORDER — PRONTOSAN WOUND IRRIGATION OPTIME
TOPICAL | Status: DC | PRN
  Administered 2024-03-29: 1

## 2024-03-29 MED ORDER — METHOCARBAMOL 500 MG PO TABS
500.0000 mg | ORAL_TABLET | Freq: Four times a day (QID) | ORAL | Status: DC | PRN
  Administered 2024-03-29 – 2024-03-30 (×2): 500 mg via ORAL
  Filled 2024-03-29 (×2): qty 1

## 2024-03-29 MED ORDER — METOCLOPRAMIDE HCL 5 MG/ML IJ SOLN
5.0000 mg | Freq: Three times a day (TID) | INTRAMUSCULAR | Status: DC | PRN
  Filled 2024-03-29: qty 2

## 2024-03-29 MED ORDER — POVIDONE-IODINE 10 % EX SWAB
2.0000 | Freq: Once | CUTANEOUS | Status: AC
  Administered 2024-03-29: 2 via TOPICAL

## 2024-03-29 MED ORDER — ACETAMINOPHEN 325 MG PO TABS: 325.0000 mg | ORAL_TABLET | Freq: Four times a day (QID) | ORAL | Status: DC | PRN

## 2024-03-29 MED ORDER — APIXABAN 2.5 MG PO TABS
2.5000 mg | ORAL_TABLET | Freq: Two times a day (BID) | ORAL | Status: DC
  Administered 2024-03-30: 2.5 mg via ORAL
  Filled 2024-03-29: qty 1

## 2024-03-29 MED ORDER — PHENOL 1.4 % MT LIQD: 1.0000 | OROMUCOSAL | Status: DC | PRN

## 2024-03-29 MED ORDER — PHENYLEPHRINE 80 MCG/ML (10ML) SYRINGE FOR IV PUSH (FOR BLOOD PRESSURE SUPPORT)
PREFILLED_SYRINGE | INTRAVENOUS | Status: AC
  Filled 2024-03-29: qty 10

## 2024-03-29 MED ORDER — DEXAMETHASONE SODIUM PHOSPHATE 10 MG/ML IJ SOLN
INTRAMUSCULAR | Status: DC | PRN
  Administered 2024-03-29: 5 mg via INTRAVENOUS

## 2024-03-29 MED ORDER — CHLORHEXIDINE GLUCONATE 0.12 % MT SOLN
15.0000 mL | Freq: Once | OROMUCOSAL | Status: AC
  Administered 2024-03-29: 15 mL via OROMUCOSAL
  Filled 2024-03-29: qty 15

## 2024-03-29 MED ORDER — OXYCODONE HCL 5 MG PO TABS: 5.0000 mg | ORAL_TABLET | Freq: Once | ORAL | Status: DC | PRN

## 2024-03-29 MED ORDER — APIXABAN 2.5 MG PO TABS
2.5000 mg | ORAL_TABLET | Freq: Two times a day (BID) | ORAL | Status: DC
  Filled 2024-03-29: qty 1

## 2024-03-29 MED ORDER — ONDANSETRON HCL 4 MG/2ML IJ SOLN: 4.0000 mg | Freq: Four times a day (QID) | INTRAMUSCULAR | Status: DC | PRN

## 2024-03-29 MED ORDER — CEFAZOLIN SODIUM-DEXTROSE 2-4 GM/100ML-% IV SOLN
2.0000 g | Freq: Four times a day (QID) | INTRAVENOUS | Status: AC
  Administered 2024-03-29 (×2): 2 g via INTRAVENOUS
  Filled 2024-03-29 (×2): qty 100

## 2024-03-29 MED ORDER — GLYCOPYRROLATE 0.2 MG/ML IJ SOLN
INTRAMUSCULAR | Status: DC | PRN
  Administered 2024-03-29: .1 mg via INTRAVENOUS

## 2024-03-29 MED ORDER — EPHEDRINE 5 MG/ML INJ
INTRAVENOUS | Status: AC
Start: 2024-03-29 — End: 2024-03-29
  Filled 2024-03-29: qty 5

## 2024-03-29 MED ORDER — ONDANSETRON HCL 4 MG PO TABS
4.0000 mg | ORAL_TABLET | Freq: Four times a day (QID) | ORAL | Status: DC | PRN
  Administered 2024-03-29 – 2024-03-30 (×2): 4 mg via ORAL
  Filled 2024-03-29 (×2): qty 1

## 2024-03-29 MED ORDER — GLYCOPYRROLATE PF 0.2 MG/ML IJ SOSY
PREFILLED_SYRINGE | INTRAMUSCULAR | Status: AC
  Filled 2024-03-29: qty 1

## 2024-03-29 MED ORDER — VANCOMYCIN HCL 1000 MG IV SOLR
INTRAVENOUS | Status: AC
  Filled 2024-03-29: qty 20

## 2024-03-29 MED ORDER — KETOROLAC TROMETHAMINE 15 MG/ML IJ SOLN
7.5000 mg | Freq: Four times a day (QID) | INTRAMUSCULAR | Status: DC
  Administered 2024-03-29 – 2024-03-30 (×3): 7.5 mg via INTRAVENOUS
  Filled 2024-03-29 (×3): qty 1

## 2024-03-29 MED ORDER — METHOCARBAMOL 1000 MG/10ML IJ SOLN: 500.0000 mg | Freq: Four times a day (QID) | INTRAMUSCULAR | Status: DC | PRN

## 2024-03-29 MED ORDER — FENTANYL CITRATE (PF) 100 MCG/2ML IJ SOLN: 100.0000 ug | Freq: Once | INTRAMUSCULAR | Status: AC

## 2024-03-29 MED ORDER — DEXAMETHASONE SODIUM PHOSPHATE 10 MG/ML IJ SOLN: 10.0000 mg | Freq: Once | INTRAMUSCULAR | Status: DC

## 2024-03-29 MED ORDER — FENTANYL CITRATE (PF) 100 MCG/2ML IJ SOLN: 25.0000 ug | INTRAMUSCULAR | Status: DC | PRN

## 2024-03-29 MED ORDER — OXYCODONE HCL 5 MG PO TABS: 10.0000 mg | ORAL_TABLET | ORAL | Status: DC | PRN

## 2024-03-29 MED ORDER — SODIUM CHLORIDE 0.9 % IR SOLN
Status: DC | PRN
Start: 2024-03-29 — End: 2024-03-29
  Administered 2024-03-29: 1000 mL

## 2024-03-29 MED ORDER — FENTANYL CITRATE (PF) 250 MCG/5ML IJ SOLN
INTRAMUSCULAR | Status: AC
Start: 2024-03-29 — End: 2024-03-29
  Filled 2024-03-29: qty 5

## 2024-03-29 MED ORDER — PROPOFOL 1000 MG/100ML IV EMUL
INTRAVENOUS | Status: AC
  Filled 2024-03-29: qty 100

## 2024-03-29 MED ORDER — 0.9 % SODIUM CHLORIDE (POUR BTL) OPTIME
TOPICAL | Status: DC | PRN
  Administered 2024-03-29: 1000 mL

## 2024-03-29 MED ORDER — ONDANSETRON HCL 4 MG/2ML IJ SOLN
INTRAMUSCULAR | Status: DC | PRN
  Administered 2024-03-29: 4 mg via INTRAVENOUS

## 2024-03-29 MED ORDER — LIDOCAINE 2% (20 MG/ML) 5 ML SYRINGE
INTRAMUSCULAR | Status: DC | PRN
  Administered 2024-03-29: 20 mg via INTRAVENOUS

## 2024-03-29 MED ORDER — HYDROMORPHONE HCL 1 MG/ML IJ SOLN: 0.5000 mg | INTRAMUSCULAR | Status: DC | PRN

## 2024-03-29 MED ORDER — AMISULPRIDE (ANTIEMETIC) 5 MG/2ML IV SOLN: 10.0000 mg | Freq: Once | INTRAVENOUS | Status: DC | PRN

## 2024-03-29 MED ORDER — ORAL CARE MOUTH RINSE: 15.0000 mL | Freq: Once | OROMUCOSAL | Status: AC

## 2024-03-29 MED ORDER — ROCURONIUM BROMIDE 10 MG/ML (PF) SYRINGE
PREFILLED_SYRINGE | INTRAVENOUS | Status: AC
  Filled 2024-03-29: qty 10

## 2024-03-29 MED ORDER — METOCLOPRAMIDE HCL 5 MG PO TABS
5.0000 mg | ORAL_TABLET | Freq: Three times a day (TID) | ORAL | Status: DC | PRN
  Filled 2024-03-29 (×2): qty 2

## 2024-03-29 MED ORDER — BUPIVACAINE-MELOXICAM ER 400-12 MG/14ML IJ SOLN
INTRAMUSCULAR | Status: AC
  Filled 2024-03-29: qty 1

## 2024-03-29 SURGICAL SUPPLY — 71 items
ALCOHOL 70% 16 OZ (MISCELLANEOUS) ×1 IMPLANT
BAG COUNTER SPONGE SURGICOUNT (BAG) IMPLANT
BAG DECANTER FOR FLEXI CONT (MISCELLANEOUS) ×1 IMPLANT
BANDAGE ESMARK 6X9 LF (GAUZE/BANDAGES/DRESSINGS) IMPLANT
BLADE SAG 18X100X1.27 (BLADE) ×1 IMPLANT
BLADE SAW SGTL 13X75X1.27 (BLADE) IMPLANT
BLADE SAW SGTL 73X25 THK (BLADE) ×1 IMPLANT
BOWL SMART MIX CTS (DISPOSABLE) ×1 IMPLANT
CEMENT BONE REFOBACIN R1X40 US (Cement) IMPLANT
CLSR STERI-STRIP ANTIMIC 1/2X4 (GAUZE/BANDAGES/DRESSINGS) IMPLANT
COOLER ICEMAN CLASSIC (MISCELLANEOUS) ×1 IMPLANT
COVER SURGICAL LIGHT HANDLE (MISCELLANEOUS) ×1 IMPLANT
CUFF TOURN SGL QUICK 42 (TOURNIQUET CUFF) IMPLANT
CUFF TRNQT CYL 34X4.125X (TOURNIQUET CUFF) ×1 IMPLANT
DERMABOND ADVANCED .7 DNX12 (GAUZE/BANDAGES/DRESSINGS) ×1 IMPLANT
DRAPE EXTREMITY T 121X128X90 (DISPOSABLE) ×1 IMPLANT
DRAPE HALF SHEET 40X57 (DRAPES) ×1 IMPLANT
DRAPE INCISE IOBAN 66X45 STRL (DRAPES) ×1 IMPLANT
DRAPE POUCH INSTRU U-SHP 10X18 (DRAPES) ×1 IMPLANT
DRAPE SURG ORHT 6 SPLT 77X108 (DRAPES) IMPLANT
DRAPE U-SHAPE 47X51 STRL (DRAPES) ×2 IMPLANT
DRSG AQUACEL AG ADV 3.5X10 (GAUZE/BANDAGES/DRESSINGS) ×1 IMPLANT
DURAPREP 26ML APPLICATOR (WOUND CARE) ×3 IMPLANT
ELECT CAUTERY BLADE 6.4 (BLADE) ×1 IMPLANT
ELECT PENCIL ROCKER SW 15FT (MISCELLANEOUS) ×1 IMPLANT
ELECTRODE REM PT RTRN 9FT ADLT (ELECTROSURGICAL) ×1 IMPLANT
FEMUR CMTD CR STD SZ 6 LT KNEE (Joint) IMPLANT
GLOVE BIOGEL PI IND STRL 7.0 (GLOVE) ×2 IMPLANT
GLOVE BIOGEL PI IND STRL 7.5 (GLOVE) ×5 IMPLANT
GLOVE ECLIPSE 7.0 STRL STRAW (GLOVE) ×3 IMPLANT
GLOVE INDICATOR 7.0 STRL GRN (GLOVE) ×1 IMPLANT
GLOVE INDICATOR 7.5 STRL GRN (GLOVE) ×1 IMPLANT
GLOVE SURG SYN 7.5 PF PI (GLOVE) ×2 IMPLANT
GLOVE SURG UNDER LTX SZ7.5 (GLOVE) ×2 IMPLANT
GLOVE SURG UNDER POLY LF SZ7 (GLOVE) ×2 IMPLANT
GOWN STRL REUS W/ TWL LRG LVL3 (GOWN DISPOSABLE) ×1 IMPLANT
GOWN STRL SURGICAL XL XLNG (GOWN DISPOSABLE) IMPLANT
GOWN TOGA ZIPPER T7+ PEEL AWAY (MISCELLANEOUS) ×1 IMPLANT
HOOD PEEL AWAY T7 (MISCELLANEOUS) ×1 IMPLANT
KIT BASIN OR (CUSTOM PROCEDURE TRAY) ×1 IMPLANT
KIT TURNOVER KIT B (KITS) ×1 IMPLANT
MANIFOLD NEPTUNE II (INSTRUMENTS) ×1 IMPLANT
MARKER SKIN DUAL TIP RULER LAB (MISCELLANEOUS) ×2 IMPLANT
NDL SPNL 18GX3.5 QUINCKE PK (NEEDLE) ×1 IMPLANT
NEEDLE SPNL 18GX3.5 QUINCKE PK (NEEDLE) ×1 IMPLANT
NS IRRIG 1000ML POUR BTL (IV SOLUTION) ×1 IMPLANT
PACK TOTAL JOINT (CUSTOM PROCEDURE TRAY) ×1 IMPLANT
PAD ARMBOARD POSITIONER FOAM (MISCELLANEOUS) ×2 IMPLANT
PAD COLD SHLDR WRAP-ON (PAD) ×1 IMPLANT
PIN DRILL HDLS TROCAR 75 4PK (PIN) IMPLANT
SCREW FEMALE HEX FIX 25X2.5 (ORTHOPEDIC DISPOSABLE SUPPLIES) IMPLANT
SET HNDPC FAN SPRY TIP SCT (DISPOSABLE) ×1 IMPLANT
SOLUTION PRONTOSAN WOUND 350ML (IRRIGATION / IRRIGATOR) ×1 IMPLANT
STAPLER SKIN PROX 35W (STAPLE) IMPLANT
STEM POLY PAT PLY 32M KNEE (Knees) IMPLANT
STEM TIBIA 5 DEG SZ E L KNEE (Knees) IMPLANT
STEM TIBIAL SZ6-7 E-F10 (Stem) IMPLANT
SUCTION TUBE FRAZIER 10FR DISP (SUCTIONS) IMPLANT
SUT ETHILON 2 0 FS 18 (SUTURE) IMPLANT
SUT MNCRL AB 3-0 PS2 27 (SUTURE) IMPLANT
SUT STRATAFIX PDS+ 0 24IN (SUTURE) IMPLANT
SUT VIC AB 0 CT1 27XBRD ANBCTR (SUTURE) ×2 IMPLANT
SUT VIC AB 1 CTX 27 (SUTURE) ×3 IMPLANT
SUT VIC AB 2-0 CT1 TAPERPNT 27 (SUTURE) ×4 IMPLANT
SYR 50ML LL SCALE MARK (SYRINGE) ×2 IMPLANT
TOWEL GREEN STERILE (TOWEL DISPOSABLE) ×1 IMPLANT
TOWEL GREEN STERILE FF (TOWEL DISPOSABLE) ×1 IMPLANT
TRAY CATH INTERMITTENT SS 16FR (CATHETERS) IMPLANT
TUBE SUCT ARGYLE STRL (TUBING) ×1 IMPLANT
UNDERPAD 30X36 HEAVY ABSORB (UNDERPADS AND DIAPERS) ×1 IMPLANT
YANKAUER SUCT BULB TIP NO VENT (SUCTIONS) ×1 IMPLANT

## 2024-03-29 NOTE — Anesthesia Procedure Notes (Signed)
 Spinal  Patient location during procedure: OR Start time: 03/29/2024 10:25 AM End time: 03/29/2024 10:32 AM Reason for block: surgical anesthesia Staffing Performed: anesthesiologist  Anesthesiologist: Peggye Delon Brunswick, MD Performed by: Peggye Delon Brunswick, MD Authorized by: Peggye Delon Brunswick, MD   Preanesthetic Checklist Completed: patient identified, IV checked, site marked, risks and benefits discussed, surgical consent, monitors and equipment checked, pre-op evaluation and timeout performed Spinal Block Patient position: sitting Prep: DuraPrep Patient monitoring: blood pressure and continuous pulse ox Approach: midline Location: L3-4 Injection technique: single-shot Needle Needle type: Pencan  Needle gauge: 24 G Needle length: 9 cm Additional Notes Risks and benefits of neuraxial anesthesia including, but not limited to, infection, bleeding, local anesthetic toxicity, headache, hypotension, back pain, block failure, etc. were discussed with the patient. The patient expressed understanding and consented to the procedure. I confirmed that the patient has no bleeding disorders and is not taking blood thinners. I confirmed the patient's last platelet count with the nurse. Monitors were applied. A time-out was performed immediately prior to the procedure. Sterile technique was used throughout the whole procedure.   3 attempt(s)

## 2024-03-29 NOTE — Op Note (Signed)
 Total Knee Arthroplasty Procedure Note  Preoperative diagnosis: Left knee osteoarthritis  Postoperative diagnosis:same  Operative findings: Complete loss articular cartilage from lateral and patellofemoral compartments Mild valgus deformity, flexion contracture  Operative procedure: Left total knee arthroplasty. CPT [id number]  Surgeon: N. Ozell Cummins, MD  Assist: Ronal [name] Grave, PA-C; necessary for the timely completion of procedure and due to complexity of procedure.  Anesthesia: Spinal, regional, local  Tourniquet time: see anesthesia record  Implants used: Zimmer persona Femur: CR 6 Tibia: E  Patella: 32 mm Polyethylene: 10 mm medial congruent  Indication: Cristina Sawyer is a 83 y.o. year old female with a history of knee pain. Having failed conservative management, the patient elected to proceed with a total knee arthroplasty.  We have reviewed the risk and benefits of the surgery and they elected to proceed after voicing understanding.  Procedure:  After informed consent was obtained and understanding of the risk were voiced including but not limited to bleeding, infection, damage to surrounding structures including nerves and vessels, blood clots, leg length inequality and the failure to achieve desired results, the operative extremity was marked with verbal confirmation of the patient in the holding area.   The patient was then brought to the operating room and transported to the operating room table in the supine position.  A tourniquet was applied to the operative extremity around the upper thigh. The operative limb was then prepped and draped in the usual sterile fashion and preoperative antibiotics were administered.  A time out was performed prior to the start of surgery confirming the correct extremity, preoperative antibiotic administration, as well as team members, implants and instruments available for the case. Correct surgical site was also confirmed with  preoperative radiographs. The limb was then elevated for exsanguination and the tourniquet was inflated. A midline incision was made and a standard medial parapatellar approach was performed.  The infrapatellar fat pad was removed.  Suprapatellar synovium was removed to reveal the anterior distal femoral cortex.  A medial peel was performed to release the capsule and the deep MCL off of the medial tibial plateau back to the semimembranosus.  The patella was then everted which showed complete loss of articular cartilage and was resected down to 14 mm and sized to a 32 mm.  A cover was placed on the patella for protection from retractors.  The knee was then brought into flexion and we then turned our attention to the femur.  The ACL was sacrificed.  Start site was drilled in the femur and the intramedullary distal femoral cutting guide was placed, set at 5 degrees valgus, taking 12 mm of distal resection due to flexion contracture. The distal cut was made. Osteophytes were then removed.  Next, the proximal tibial cutting guide was placed with appropriate slope, varus/valgus alignment and depth of resection.  The drop rod was attached to confirm that it was aimed at the second metatarsal.  The proximal tibial cut was made taking 4 mm off the low lateral side. Gap blocks were then used to assess the extension gap and alignment, and appropriate soft tissue releases were performed. Attention was turned back to the femur, which was sized using the sizing guide to a size 6. Appropriate rotation of the femoral component was determined using epicondylar axis, Whiteside's line, and assessing the flexion gap under ligament tension. The appropriate size 4-in-1 cutting block was placed and checked with an angel wing and cuts were made. Posterior femoral osteophytes and uncapped bone were then  removed with the curved osteotome.  The menisci were removed.  Trial components were placed, and stability was checked in full extension,  mid-flexion, and deep flexion.  PCL was resected to balance the flexion space. Proper tibial rotation was determined and marked.  The patella tracked well without a lateral release.  The femoral lugs were then drilled. Trial components were then removed and tibial preparation performed.  The trial tibia was pointed to the medial third of the tibial tubercle.  The tibia was sized for a size E component and prepared.  Trial components were removed.    The bony surfaces were irrigated with a pulse lavage and then dried. Bone cement was vacuum mixed on the back table, and the final components sized above were cemented into place.  Antibiotic irrigation was placed in the knee joint and soft tissues while the cement cured.  After cement had finished curing, excess cement was removed. The stability of the construct was re-evaluated throughout a range of motion and found to be acceptable. The trial liner was removed, the knee was copiously irrigated, and the knee was re-evaluated for any excess bone debris. The real polyethylene liner, 10 mm thick, was inserted and checked to ensure the locking mechanism had engaged appropriately. The tourniquet was deflated and hemostasis was achieved. The wound was irrigated with normal saline.  One gram of vancomycin  powder was placed in the surgical bed.  Topical mixture of 0.25% bupivacaine  and meloxicam  was placed in the joint for postoperative pain.  Capsular closure was performed with a #1 stratafix in flexion, subcutaneous fat closed with a 0 vicryl suture, then subcutaneous tissue closed with interrupted 2.0 vicryl suture. The skin was then closed with a 2.0 nylon and dermabond. A sterile dressing was applied.  The patient was awakened in the operating room and taken to recovery in stable condition. All sponge, needle, and instrument counts were correct at the end of the case.  [name] Grave was necessary for opening, closing, retracting, limb positioning and overall  facilitation and completion of the surgery.  Position: supine  Complications: none.  Time Out: performed   Drains/Packing: none Estimated blood loss: minimal Returned to Recovery Room: in good condition.   Mechanical VTE (DVT) Prophylaxis: sequential compression devices, TED thigh-high  Chemical VTE (DVT) Prophylaxis: eliquis  POD 1  Fluid Replacement  Crystalloid: see anesthesia record Blood: none  FFP: none   Specimens Removed: 1 to pathology  Sponge and Instrument Count Correct? yes  PACU: portable radiograph - knee AP and Lateral  Plan/RTC: Return in 2 weeks for suture removal.  Weight Bearing/Load Lower Extremity: full   Implant Name Type Inv. Item Serial No. Manufacturer Lot No. LRB No. Used Action  CEMENT BONE REFOBACIN [id number] US  - [id number] Cement CEMENT BONE REFOBACIN [id number] US   ZIMMER RECON(ORTH,TRAU,BIO,SG) [id number] Left 2 Implanted  STEM POLY PAT PLY 38M KNEE - [id number] Knees STEM POLY PAT PLY 38M KNEE  ZIMMER RECON(ORTH,TRAU,BIO,SG) [id number] Left 1 Implanted  FEMUR CMTD CR STD SZ 6 LT KNEE - [id number] Joint FEMUR CMTD CR STD SZ 6 LT KNEE  ZIMMER RECON(ORTH,TRAU,BIO,SG) [id number] Left 1 Implanted  STEM TIBIA 5 DEG SZ E L KNEE - [id number] Knees STEM TIBIA 5 DEG SZ E L KNEE  ZIMMER RECON(ORTH,TRAU,BIO,SG) [id number] Left 1 Implanted  STEM TIBIAL SZ6-7 E-F10 - [id number] Stem STEM TIBIAL SZ6-7 E-F10  ZIMMER RECON(ORTH,TRAU,BIO,SG) [id number] Left 1 Implanted    N. Ozell Cummins, MD [facility] 11:57 AM

## 2024-03-29 NOTE — Discharge Instructions (Signed)

## 2024-03-29 NOTE — Progress Notes (Signed)
 Orthopedic Tech Progress Note Patient Details:  Cristina Sawyer October 13, 1940 [id number]  Ortho Devices Type of Ortho Device: Bone foam zero knee Ortho Device/Splint Location: LLE Ortho Device/Splint Interventions: Ordered, Application, Removal   Post Interventions Patient Tolerated: Well Instructions Provided: Care of device  Cristina Sawyer Pac 03/29/2024, 2:36 PM

## 2024-03-29 NOTE — Transfer of Care (Signed)
 Immediate Anesthesia Transfer of Care Note  Patient: Cristina Sawyer  Procedure(s) Performed: LEFT TOTAL KNEE ARTHROPLASTY (Left: Knee)  Patient Location: PACU  Anesthesia Type:MAC combined with regional for post-op pain  Level of Consciousness: drowsy  Airway & Oxygen Therapy: Patient Spontanous Breathing and Patient connected to face mask oxygen  Post-op Assessment: Report given to RN and Post -op Vital signs reviewed and stable  Post vital signs: Reviewed and stable  Last Vitals:  Vitals Value Taken Time  BP 98/50 03/29/24 12:36  Temp 36.4 C 03/29/24 12:36  Pulse 84 03/29/24 12:38  Resp 14 03/29/24 12:38  SpO2 93 % 03/29/24 12:38  Vitals shown include unfiled device data.  Last Pain:  Vitals:   03/29/24 0902  TempSrc:   PainSc: 0-No pain         Complications: No notable events documented.

## 2024-03-29 NOTE — Anesthesia Postprocedure Evaluation (Signed)
 Anesthesia Post Note  Patient: Cristina Sawyer  Procedure(s) Performed: LEFT TOTAL KNEE ARTHROPLASTY (Left: Knee)     Patient location during evaluation: PACU Anesthesia Type: MAC and Spinal Level of consciousness: awake Pain management: pain level controlled Vital Signs Assessment: post-procedure vital signs reviewed and stable Respiratory status: spontaneous breathing, respiratory function stable and nonlabored ventilation Cardiovascular status: blood pressure returned to baseline and stable Postop Assessment: no headache, no backache and no apparent nausea or vomiting Anesthetic complications: no   No notable events documented.  Last Vitals:  Vitals:   03/29/24 1345 03/29/24 1400  BP: (!) 117/98 106/71  Pulse: 70 70  Resp: 11 12  Temp:  36.4 C  SpO2: 95% 94%    Last Pain:  Vitals:   03/29/24 1400  TempSrc:   PainSc: Asleep        RLE Motor Response: Purposeful movement (03/29/24 1400) RLE Sensation: Decreased (03/29/24 1400)      Delon Aisha Arch

## 2024-03-29 NOTE — Anesthesia Procedure Notes (Addendum)
 Anesthesia Regional Block: Adductor canal block   Pre-Anesthetic Checklist: , timeout performed,  Correct Patient, Correct Site, Correct Laterality,  Correct Procedure, Correct Position, site marked,  Risks and benefits discussed,  Surgical consent,  Pre-op evaluation,  At surgeon's request and post-op pain management  Laterality: Left  Prep: chloraprep       Needles:  Injection technique: Single-shot  Needle Type: Echogenic Stimulator Needle     Needle Length: 9cm  Needle Gauge: 21     Additional Needles:   Procedures:,,,, ultrasound used (permanent image in chart),,    Narrative:  Start time: 03/29/2024 9:04 AM End time: 03/29/2024 9:08 AM Injection made incrementally with aspirations every 5 mL.  Performed by: Personally  Anesthesiologist: Peggye Delon Brunswick, MD  Additional Notes: Discussed risks and benefits of nerve block including, but not limited to, prolonged and/or permanent nerve injury involving sensory and/or motor function. Monitors were applied and a time-out was performed. The nerve and associated structures were visualized under ultrasound guidance. After negative aspiration, local anesthetic was slowly injected around the nerve. There was no evidence of high pressure during the procedure. There were no paresthesias. VSS remained stable and the patient tolerated the procedure well.

## 2024-03-29 NOTE — Evaluation (Signed)
 Physical Therapy Evaluation Patient Details Name: Cristina Sawyer MRN: [id number] DOB: [birth year] Today's Date: 03/29/2024  History of Present Illness  Pt is an 83 y.o. F presenting to [facility] on 03/29/24 for elective L TKA. PMH is significant for R TKA, OA, cataract, CKD, diverticulosis, dyslipidemia, HTN, GERD, and thyroid  surgery.  Clinical Impression  Prior to admittance, pt was mobilizing independently, only utilizing a SPC when experiencing significant L knee pain. Pt presents to evaluation with deficits in mobility, strength, balance, power, activity tolerance, and pain, all limiting pt's ability to mobilize near baseline. Pt was able to ambulate w/ an AD and no physical assistance given. Pt would benefit from further gait, and stair training. Pt was given and educated on contents of TKA exercise packet. Further education provided on importance of frequent mobilization and modalities. PT will continue to treat pt while she is admitted.         If plan is discharge home, recommend the following: A little help with walking and/or transfers;A little help with bathing/dressing/bathroom;Assist for transportation;Help with stairs or ramp for entrance   Can travel by private vehicle        Equipment Recommendations None recommended by PT  Recommendations for Other Services       Functional Status Assessment Patient has had a recent decline in their functional status and demonstrates the ability to make significant improvements in function in a reasonable and predictable amount of time.     Precautions / Restrictions Precautions Precautions: Fall Recall of Precautions/Restrictions: Intact Restrictions Weight Bearing Restrictions Per Provider Order: Yes      Mobility  Bed Mobility Overal bed mobility: Needs Assistance Bed Mobility: Supine to Sit, Sit to Supine     Supine to sit: Supervision, HOB elevated Sit to supine: Supervision, HOB elevated   General bed mobility comments:  for sit>sup, pt utilized RLE to assist LLE into bed    Transfers Overall transfer level: Needs assistance Equipment used: Rolling walker (2 wheels) Transfers: Sit to/from Stand Sit to Stand: Contact guard assist           General transfer comment: STS from EOB and bathroom w/ RW. VC given for sequencing, UE and LE placement; increased time to complete.    Ambulation/Gait Ambulation/Gait assistance: Contact guard assist Gait Distance (Feet): 100 Feet Assistive device: Rolling walker (2 wheels) Gait Pattern/deviations: Step-to pattern, Decreased step length - left, Decreased stance time - left, Decreased stride length, Decreased weight shift to left Gait velocity: reduced Gait velocity interpretation: <1.8 ft/sec, indicate of risk for recurrent falls   General Gait Details: Pt ambulates w/ step to gait pattern and increased reliance on RW for stability.  Stairs            Wheelchair Mobility     Tilt Bed    Modified Rankin (Stroke Patients Only)       Balance Overall balance assessment: Needs assistance Sitting-balance support: No upper extremity supported, Feet supported Sitting balance-Leahy Scale: Good Sitting balance - Comments: seated EOB   Standing balance support: Bilateral upper extremity supported, During functional activity, Reliant on assistive device for balance Standing balance-Leahy Scale: Poor Standing balance comment: reliant on external support                             Pertinent Vitals/Pain Pain Assessment Pain Assessment: 0-10 Pain Score: 2  Pain Location: L knee Pain Descriptors / Indicators: Constant, Discomfort, Grimacing Pain Intervention(s): Limited activity within patient's  tolerance, Monitored during session    Home Living Family/patient expects to be discharged to:: Private residence Living Arrangements: Alone Available Help at Discharge: Family;Available 24 hours/day (nieces) Type of Home: House Home Access:  Stairs to enter Entrance Stairs-Rails: None Entrance Stairs-Number of Steps: 1   Home Layout: One level Home Equipment: Agricultural consultant (2 wheels);BSC/3in1;Cane - single point      Prior Function Prior Level of Function : Independent/Modified Independent             Mobility Comments: independent w/out an AD. utilizes when L knee is in significant pain ADLs Comments: independent     Extremity/Trunk Assessment   Upper Extremity Assessment Upper Extremity Assessment: Overall WFL for tasks assessed    Lower Extremity Assessment Lower Extremity Assessment: LLE deficits/detail LLE Deficits / Details: reduced strength as per functional assessment LLE: Unable to fully assess due to pain    Cervical / Trunk Assessment Cervical / Trunk Assessment: Kyphotic  Communication   Communication Communication: No apparent difficulties    Cognition Arousal: Alert Behavior During Therapy: WFL for tasks assessed/performed   PT - Cognitive impairments: No apparent impairments                         Following commands: Intact       Cueing Cueing Techniques: Verbal cues     General Comments General comments (skin integrity, edema, etc.): no signs of acute distress    Exercises     Assessment/Plan    PT Assessment Patient needs continued PT services  PT Problem List Decreased strength;Decreased range of motion;Decreased activity tolerance;Decreased mobility;Decreased balance;Decreased coordination;Decreased knowledge of use of DME;Pain       PT Treatment Interventions DME instruction;Gait training;Stair training;Functional mobility training;Therapeutic activities;Balance training;Therapeutic exercise;Neuromuscular re-education;Patient/family education;Wheelchair mobility training;Manual techniques;Modalities    PT Goals (Current goals can be found in the Care Plan section)  Acute Rehab PT Goals Patient Stated Goal: to go home PT Goal Formulation: With  patient Time For Goal Achievement: 04/02/24 Potential to Achieve Goals: Good    Frequency 7X/week     Co-evaluation               AM-PAC PT 6 Clicks Mobility  Outcome Measure Help needed turning from your back to your side while in a flat bed without using bedrails?: A Little Help needed moving from lying on your back to sitting on the side of a flat bed without using bedrails?: A Little Help needed moving to and from a bed to a chair (including a wheelchair)?: A Little Help needed standing up from a chair using your arms (e.g., wheelchair or bedside chair)?: A Little Help needed to walk in hospital room?: A Little Help needed climbing 3-5 steps with a railing? : A Little 6 Click Score: 18    End of Session Equipment Utilized During Treatment: Gait belt Activity Tolerance: Patient tolerated treatment well Patient left: in bed;with call bell/phone within reach;with bed alarm set;with family/visitor present Nurse Communication: Mobility status PT Visit Diagnosis: Other abnormalities of gait and mobility (R26.89);Muscle weakness (generalized) (M62.81);Pain Pain - Right/Left: Left Pain - part of body: Knee    Time: 8393-8366 PT Time Calculation (min) (ACUTE ONLY): 27 min   Charges:   PT Evaluation $PT Eval Low Complexity: 1 Low   PT General Charges $$ ACUTE PT VISIT: 1 Visit         Leontine Hilt, SPT Acute Rehab [phone]   Leontine Hilt 03/29/2024, 4:59 PM

## 2024-03-29 NOTE — H&P (Signed)
 PREOPERATIVE H&P  Chief Complaint: left knee osteoarthritis  HPI: Cristina Sawyer is a 83 y.o. female who presents for surgical treatment of left knee osteoarthritis.  She denies any changes in medical history.  Past Surgical History:  Procedure Laterality Date  . ABDOMINAL HYSTERECTOMY  1975  . APPENDECTOMY  1975  . COLONOSCOPY    . HAND SURGERY     Cyst removal  . THYROID  SURGERY  2000   to remove nodule  . TOTAL KNEE ARTHROPLASTY Right 12/23/2022   Procedure: RIGHT TOTAL KNEE ARTHROPLASTY;  Surgeon: Jerri Kay HERO, MD;  Location: [facility] OR;  Service: Orthopedics;  Laterality: Right;   Social History   Socioeconomic History  . Marital status: Widowed    Spouse name: Not on file  . Number of children: Not on file  . Years of education: Not on file  . Highest education level: Not on file  Occupational History  . Occupation: Retired from Art therapist  Tobacco Use  . Smoking status: Never  . Smokeless tobacco: Never  Vaping Use  . Vaping status: Never Used  Substance and Sexual Activity  . Alcohol  use: Yes    Alcohol /week: 1.0 standard drink of alcohol     Types: 1 Glasses of wine per week    Comment: socially  . Drug use: No  . Sexual activity: Yes  Other Topics Concern  . Not on file  Social History Narrative   Pt lives alone.    Social Drivers of Corporate investment banker Strain: Low Risk  (05/06/2023)   Overall Financial Resource Strain (CARDIA)   . Difficulty of Paying Living Expenses: Not hard at all  Food Insecurity: No Food Insecurity (05/06/2023)   Hunger Vital Sign   . Worried About Programme researcher, broadcasting/film/video in the Last Year: Never true   . Ran Out of Food in the Last Year: Never true  Transportation Needs: No Transportation Needs (05/06/2023)   PRAPARE - Transportation   . Lack of Transportation (Medical): No   . Lack of Transportation (Non-Medical): No  Physical Activity: Insufficiently Active (05/06/2023)   Exercise Vital Sign   . Days of Exercise  per Week: 4 days   . Minutes of Exercise per Session: 20 min  Stress: No Stress Concern Present (05/06/2023)   [employer] of Occupational Health - Occupational Stress Questionnaire   . Feeling of Stress : Not at all  Social Connections: Socially Isolated (05/06/2023)   Social Connection and Isolation Panel   . Frequency of Communication with Friends and Family: More than three times a week   . Frequency of Social Gatherings with Friends and Family: Once a week   . Attends Religious Services: Never   . Active Member of Clubs or Organizations: No   . Attends Banker Meetings: Never   . Marital Status: Widowed   Family History  Problem Relation Age of Onset  . Heart disease Sister   . Heart disease Sister        in her 76s  . Colon cancer Neg Hx    Allergies  Allergen Reactions  . Codeine Itching and Nausea Only  . Ciprofloxacin  Hcl Rash   Prior to Admission medications   Medication Sig Start Date End Date Taking? Authorizing Provider  acetaminophen  (TYLENOL ) 500 MG tablet Take 500 mg by mouth every 6 (six) hours as needed for moderate pain (pain score 4-6). 1-2 tablets   Yes [provider]  amLODipine  (NORVASC ) 10 MG tablet  Take 1 tablet (10 mg total) by mouth daily. 09/03/23  Yes Joyce Norleen BROCKS, MD  aspirin  (ASPIRIN  81) 81 MG chewable tablet Chew 1 tablet (81 mg total) by mouth 2 (two) times daily. 03/18/24  Yes Jule Ronal CROME, PA-C  aspirin  EC 81 MG tablet Take 81 mg by mouth daily. Swallow whole.   Yes [provider]  cyanocobalamin (VITAMIN B12) 500 MCG tablet Take 500 mcg by mouth 3 (three) times a week.   Yes [provider]  docusate sodium  (COLACE) 100 MG capsule Take 1 capsule (100 mg total) by mouth daily as needed. 03/18/24 03/18/25  Jule Ronal CROME, PA-C  ibuprofen  (ADVIL ) 400 MG tablet Take 400 mg by mouth every 6 (six) hours as needed for moderate pain (pain score 4-6).   Yes [provider]  methocarbamol  (ROBAXIN )  500 MG tablet Take 1 tablet (500 mg total) by mouth 2 (two) times daily as needed. 03/18/24   Jule Ronal CROME, PA-C  Multiple Vitamin (MULTI-VITAMIN DAILY PO) Take 1 tablet by mouth 3 (three) times a week.   Yes [provider]  ondansetron  (ZOFRAN ) 4 MG tablet Take 1 tablet (4 mg total) by mouth every 8 (eight) hours as needed for nausea or vomiting. 03/18/24   Jule Ronal CROME, PA-C  oxyCODONE -acetaminophen  (PERCOCET) 5-325 MG tablet Take 1-2 tablets by mouth every 6 (six) hours as needed. To be taken after surgery 03/18/24   Jule Ronal CROME, PA-C  pantoprazole  (PROTONIX ) 40 MG tablet Take 1 tablet (40 mg total) by mouth daily. Needs office visit. Patient taking differently: Take 40 mg by mouth daily as needed (acid reflux). 10/01/23  Yes Joyce Norleen BROCKS, MD  rosuvastatin  (CRESTOR ) 5 MG tablet Take 1 tablet (5 mg total) by mouth daily. 09/03/23  Yes Joyce Norleen BROCKS, MD  VITAMIN D  PO Take 2,000 Units by mouth daily.   Yes [provider]  methocarbamol  (ROBAXIN ) 500 MG tablet Take 1 tablet (500 mg total) by mouth 2 (two) times daily as needed. Patient not taking: Reported on 05/06/2023 12/17/22   Jule Ronal CROME, PA-C  ondansetron  (ZOFRAN ) 4 MG tablet Take 1 tablet (4 mg total) by mouth every 8 (eight) hours as needed for nausea or vomiting. Patient not taking: Reported on 02/25/2024 12/17/22   Jule Ronal CROME, PA-C  oxyCODONE -acetaminophen  (PERCOCET) 5-325 MG tablet Take 1 tablet by mouth every 8 (eight) hours as needed. Patient not taking: Reported on 05/06/2023 02/07/23   Jerri Kay HERO, MD  Polyethyl Glycol-Propyl Glycol (SYSTANE OP) Place 1 drop into both eyes 2 (two) times daily as needed (dry eyes).    [provider]  traMADol  (ULTRAM ) 50 MG tablet Take 1 tablet (50 mg total) by mouth 2 (two) times daily as needed. Patient not taking: Reported on 02/25/2024 01/14/24   Jule Ronal CROME, PA-C  omeprazole  (PRILOSEC OTC) 20 MG tablet Take 20 mg by mouth daily.    12/11/11  [provider]     Positive ROS: All other systems have been reviewed and were otherwise negative with the exception of those mentioned in the HPI and as above.  Physical Exam: General: Alert, no acute distress Cardiovascular: No pedal edema Respiratory: No cyanosis, no use of accessory musculature GI: abdomen soft Skin: No lesions in the area of chief complaint Neurologic: Sensation intact distally Psychiatric: Patient is competent for consent with normal mood and affect Lymphatic: no lymphedema  MUSCULOSKELETAL: exam stable  Assessment: left knee osteoarthritis  Plan: Plan for Procedure(s): ARTHROPLASTY,  KNEE, TOTAL  The risks benefits and alternatives were discussed with the patient including but not limited to the risks of nonoperative treatment, versus surgical intervention including infection, bleeding, nerve injury,  blood clots, cardiopulmonary complications, morbidity, mortality, among others, and they were willing to proceed.   Ozell Cummins, MD 03/29/2024 8:40 AM

## 2024-03-30 ENCOUNTER — Encounter (HOSPITAL_COMMUNITY): Payer: Self-pay | Admitting: Orthopaedic Surgery

## 2024-03-30 DIAGNOSIS — M1712 Unilateral primary osteoarthritis, left knee: Secondary | ICD-10-CM | POA: Diagnosis not present

## 2024-03-30 DIAGNOSIS — Z96652 Presence of left artificial knee joint: Secondary | ICD-10-CM | POA: Diagnosis not present

## 2024-03-30 MED ORDER — CALCIUM CARBONATE ANTACID 500 MG PO CHEW
1.0000 | CHEWABLE_TABLET | Freq: Three times a day (TID) | ORAL | Status: DC | PRN
  Administered 2024-03-30: 400 mg via ORAL
  Filled 2024-03-30: qty 2

## 2024-03-30 MED ORDER — ALUM & MAG HYDROXIDE-SIMETH 200-200-20 MG/5ML PO SUSP: 30.0000 mL | Freq: Four times a day (QID) | ORAL | Status: DC | PRN

## 2024-03-30 NOTE — Progress Notes (Signed)
 Patient alert and oriented, void and ambulate. Surgical site clean and dry no sign of infection. D/c instructions explain and given all questions answered.

## 2024-03-30 NOTE — Evaluation (Addendum)
 Occupational Therapy Evaluation/Discharge Patient Details Name: Cristina Sawyer MRN: [id number] DOB: 08/04/1941 Today's Date: 03/30/2024   History of Present Illness   Pt is an 83 y.o. F presenting to [facility] on 03/29/24 for elective L TKA. PMH is significant for R TKA, OA, cataract, CKD, diverticulosis, dyslipidemia, HTN, GERD, and thyroid  surgery.     Clinical Impressions Patient is s/p L partial knee replacement surgery resulting in functional limitations due to the deficits listed below (see OT Problem List). All education has been completed during evaluation. Pt previously educated on knee precautions from prior PT visits. Education also provided for compensatory strategies during ADLs/mobility and Iceman machine set-up and use. Pt verbalized and/or demonstrated understanding. Recommended use of reacher at home initially to increase independence with ADL tasks. Home Health follow up OT services recommended. Thank you for the referral. OT will sign off and follow up with surgeon when recommended.       If plan is discharge home, recommend the following:   A little help with walking and/or transfers;A little help with bathing/dressing/bathroom;Help with stairs or ramp for entrance;Assistance with cooking/housework;Assist for transportation     Functional Status Assessment   Patient has had a recent decline in their functional status and demonstrates the ability to make significant improvements in function in a reasonable and predictable amount of time.     Equipment Recommendations   None recommended by OT      Precautions/Restrictions   Precautions Precautions: Fall;Knee Recall of Precautions/Restrictions: Intact Restrictions Weight Bearing Restrictions Per Provider Order: No     Mobility Bed Mobility Overal bed mobility: Modified Independent     Transfers Overall transfer level: Needs assistance Equipment used: Rolling walker (2 wheels) Transfers: Sit to/from  Stand Sit to Stand: Supervision       Balance Overall balance assessment: Mild deficits observed, not formally tested        ADL either performed or assessed with clinical judgement   ADL       General ADL Comments: Due to recent TKA, pt will require min A to complete LB ADL tasks such as bathing and dressing. Pt has a reacher at home that she is familar with using. Able to complete UB ADL tasks at Mod I level and toilet transfers with CGA using RW.     Vision Baseline Vision/History: 0 No visual deficits Ability to See in Adequate Light: 0 Adequate Patient Visual Report: No change from baseline Vision Assessment?: No apparent visual deficits     Perception Perception: Within Functional Limits       Praxis Praxis: WFL       Pertinent Vitals/Pain Pain Assessment Pain Assessment: Faces Faces Pain Scale: Hurts little more Pain Location: L knee Pain Descriptors / Indicators: Grimacing, Sore Pain Intervention(s): Ice applied, Monitored during session     Extremity/Trunk Assessment Upper Extremity Assessment Upper Extremity Assessment: Overall WFL for tasks assessed   Lower Extremity Assessment Lower Extremity Assessment: Defer to PT evaluation   Cervical / Trunk Assessment Cervical / Trunk Assessment: Kyphotic   Communication Communication Communication: No apparent difficulties   Cognition Arousal: Alert Behavior During Therapy: WFL for tasks assessed/performed Cognition: No apparent impairments  Following commands: Intact       Cueing  General Comments   Cueing Techniques: Verbal cues  Pt with c/o nausea, indigestion.           Home Living Family/patient expects to be discharged to:: Private residence Living Arrangements: Alone Available Help at Discharge: Family;Available 24 hours/day (nieces) Type  of Home: House Home Access: Stairs to enter [employer] of Steps: 1 Entrance Stairs-Rails: None Home Layout: One level     Bathroom  Shower/Tub: Chief Strategy Officer: Handicapped height Bathroom Accessibility: Yes How Accessible: Accessible via walker Home Equipment: Rolling Walker (2 wheels);BSC/3in1;Cane - single point;Shower seat;Adaptive equipment Adaptive Equipment: Reacher        Prior Functioning/Environment Prior Level of Function : Independent/Modified Independent       OT Problem List: Impaired balance (sitting and/or standing);Decreased knowledge of use of DME or AE        OT Goals(Current goals can be found in the care plan section)   Acute Rehab OT Goals Patient Stated Goal: none stated OT Goal Formulation: All assessment and education complete, DC therapy   OT Frequency:   1X visit       AM-PAC OT 6 Clicks Daily Activity     Outcome Measure Help from another person eating meals?: None Help from another person taking care of personal grooming?: None Help from another person toileting, which includes using toliet, bedpan, or urinal?: None Help from another person bathing (including washing, rinsing, drying)?: A Little Help from another person to put on and taking off regular upper body clothing?: None Help from another person to put on and taking off regular lower body clothing?: A Lot 6 Click Score: 21   End of Session Equipment Utilized During Treatment: Rolling walker (2 wheels)  Activity Tolerance: Patient tolerated treatment well Patient left: in bed;with call bell/phone within reach  OT Visit Diagnosis: Muscle weakness (generalized) (M62.81);Other abnormalities of gait and mobility (R26.89);Unsteadiness on feet (R26.81)                Time: 9084-9072 OT Time Calculation (min): 12 min Charges:  OT General Charges $OT Visit: 1 Visit OT Evaluation $OT Eval Low Complexity: 1 Low  Leita Howell, OTR/L,CBIS  Supplemental OT - [facility] and [facility] Secure Chat Preferred    Makayla Lanter, Leita BIRCH 03/30/2024, 10:29 AM

## 2024-03-30 NOTE — Progress Notes (Signed)
 Subjective: 1 Day Post-Op Procedure(s) (LRB): LEFT TOTAL KNEE ARTHROPLASTY (Left) Patient reports pain as mild.  Doing well.  Has some GERD and nausea this am.   Objective: Vital signs in last 24 hours: Temp:  [97.6 F (36.4 C)-98.9 F (37.2 C)] 98.1 F (36.7 C) (07/15 0747) Pulse Rate:  [52-87] 75 (07/15 0747) Resp:  [10-20] 16 (07/15 0747) BP: (97-143)/(50-98) 143/68 (07/15 0747) SpO2:  [92 %-100 %] 99 % (07/15 0747)  Intake/Output from previous day: 07/14 0701 - 07/15 0700 In: 1880 [P.O.:1680; IV Piggyback:200] Out: 375 [Urine:350; Blood:25] Intake/Output this shift: No intake/output data recorded.  No results for input(s): HGB in the last 72 hours. No results for input(s): WBC, RBC, HCT, PLT in the last 72 hours. No results for input(s): NA, K, CL, CO2, BUN, CREATININE, GLUCOSE, CALCIUM  in the last 72 hours. No results for input(s): LABPT, INR in the last 72 hours.  Neurologically intact Neurovascular intact Sensation intact distally Intact pulses distally Dorsiflexion/Plantar flexion intact Incision: dressing C/D/I No cellulitis present Compartment soft   Assessment/Plan: 1 Day Post-Op Procedure(s) (LRB): LEFT TOTAL KNEE ARTHROPLASTY (Left) Advance diet Up with therapy D/C IV fluids Discharge home with home health  WBAT LLE      Ronal LITTIE Grave 03/30/2024, 8:18 AM

## 2024-03-30 NOTE — Discharge Summary (Signed)
 Patient ID: Cristina Sawyer MRN: [id number] DOB/AGE: [birth year] 83 y.o.  Admit date: 03/29/2024 Discharge date: 03/30/2024  Admission Diagnoses:  Principal Problem:   Primary osteoarthritis of left knee Active Problems:   Status post total left knee replacement   Discharge Diagnoses:  Same  Past Medical History:  Diagnosis Date   Arthritis    Cataract    Chronic kidney disease    Diverticulosis    Dyslipidemia    GERD (gastroesophageal reflux disease)    HTN (hypertension) 2015   Personal history of colonic adenoma 08/09/2008   Vitamin D  deficiency     Surgeries: Procedure(s): LEFT TOTAL KNEE ARTHROPLASTY on 03/29/2024   Consultants:   Discharged Condition: Improved  Hospital Course: JENNESSA TRIGO is an 83 y.o. female who was admitted 03/29/2024 for operative treatment ofPrimary osteoarthritis of left knee. Patient has severe unremitting pain that affects sleep, daily activities, and work/hobbies. After pre-op clearance the patient was taken to the operating room on 03/29/2024 and underwent  Procedure(s): LEFT TOTAL KNEE ARTHROPLASTY.    Patient was given perioperative antibiotics:  Anti-infectives (From admission, onward)    Start     Dose/Rate Route Frequency Ordered Stop   03/29/24 1600  ceFAZolin  (ANCEF ) IVPB 2g/100 mL premix        2 g 200 mL/hr over 30 Minutes Intravenous Every 6 hours 03/29/24 1411 03/30/24 0734   03/29/24 1057  vancomycin  (VANCOCIN ) powder  Status:  Discontinued          As needed 03/29/24 1057 03/29/24 1232   03/29/24 0800  ceFAZolin  (ANCEF ) IVPB 2g/100 mL premix        2 g 200 mL/hr over 30 Minutes Intravenous On call to O.R. 03/29/24 0748 03/29/24 1055        Patient was given sequential compression devices, early ambulation, and chemoprophylaxis to prevent DVT.  Patient benefited maximally from hospital stay and there were no complications.    Recent vital signs: Patient Vitals for the past 24 hrs:  BP Temp Temp src Pulse Resp  SpO2  03/30/24 0747 (!) 143/68 98.1 F (36.7 C) Oral 75 16 99 %  03/30/24 0311 (!) 122/59 98.2 F (36.8 C) Oral 63 18 98 %  03/29/24 2250 (!) 115/50 97.7 F (36.5 C) Oral 62 18 99 %  03/29/24 1939 (!) 121/55 97.7 F (36.5 C) Oral 81 18 96 %  03/29/24 1549 131/66 97.6 F (36.4 C) Oral (!) 56 20 100 %  03/29/24 1422 106/60 98.9 F (37.2 C) -- 64 20 100 %  03/29/24 1400 106/71 97.6 F (36.4 C) -- 70 12 94 %  03/29/24 1345 (!) 117/98 -- -- 70 11 95 %  03/29/24 1330 110/71 -- -- (!) 52 13 93 %  03/29/24 1315 104/60 -- -- 71 12 97 %  03/29/24 1300 107/71 -- -- 71 10 94 %  03/29/24 1245 (!) 97/56 -- -- 75 12 92 %  03/29/24 1236 (!) 98/50 97.6 F (36.4 C) -- 87 19 95 %  03/29/24 0910 (!) 136/58 -- -- 83 14 100 %  03/29/24 0908 (!) 138/57 -- -- 83 14 100 %  03/29/24 0902 (!) 141/60 -- -- 82 13 100 %     Recent laboratory studies: No results for input(s): WBC, HGB, HCT, PLT, NA, K, CL, CO2, BUN, CREATININE, GLUCOSE, INR, CALCIUM  in the last 72 hours.  Invalid input(s): PT, 2   Discharge Medications:   Allergies as of 03/30/2024       Reactions  Codeine Itching, Nausea Only   Ciprofloxacin  Hcl Rash        Medication List     STOP taking these medications    acetaminophen  500 MG tablet Commonly known as: TYLENOL    ibuprofen  400 MG tablet Commonly known as: ADVIL    traMADol  50 MG tablet Commonly known as: ULTRAM        TAKE these medications    amLODipine  10 MG tablet Commonly known as: NORVASC  Take 1 tablet (10 mg total) by mouth daily.   aspirin  81 MG chewable tablet Commonly known as: Aspirin  81 Chew 1 tablet (81 mg total) by mouth 2 (two) times daily. What changed: Another medication with the same name was removed. Continue taking this medication, and follow the directions you see here.   cyanocobalamin 500 MCG tablet Commonly known as: VITAMIN B12 Take 500 mcg by mouth 3 (three) times a week.   docusate sodium  100 MG  capsule Commonly known as: Colace Take 1 capsule (100 mg total) by mouth daily as needed.   methocarbamol  500 MG tablet Commonly known as: ROBAXIN  Take 1 tablet (500 mg total) by mouth 2 (two) times daily as needed. What changed: Another medication with the same name was removed. Continue taking this medication, and follow the directions you see here.   MULTI-VITAMIN DAILY PO Take 1 tablet by mouth 3 (three) times a week.   ondansetron  4 MG tablet Commonly known as: Zofran  Take 1 tablet (4 mg total) by mouth every 8 (eight) hours as needed for nausea or vomiting.   ondansetron  4 MG tablet Commonly known as: Zofran  Take 1 tablet (4 mg total) by mouth every 8 (eight) hours as needed for nausea or vomiting.   oxyCODONE -acetaminophen  5-325 MG tablet Commonly known as: Percocet Take 1-2 tablets by mouth every 6 (six) hours as needed. To be taken after surgery What changed: Another medication with the same name was removed. Continue taking this medication, and follow the directions you see here.   pantoprazole  40 MG tablet Commonly known as: PROTONIX  Take 1 tablet (40 mg total) by mouth daily. Needs office visit. What changed:  when to take this reasons to take this additional instructions   rosuvastatin  5 MG tablet Commonly known as: CRESTOR  Take 1 tablet (5 mg total) by mouth daily.   SYSTANE OP Place 1 drop into both eyes 2 (two) times daily as needed (dry eyes).   VITAMIN D  PO Take 2,000 Units by mouth daily.               Durable Medical Equipment  (From admission, onward)           Start     Ordered   03/29/24 1412  DME Walker rolling  Once       Question Answer Comment  Walker: With 5 Inch Wheels   Patient needs a walker to treat with the following condition Status post left partial knee replacement      03/29/24 1411   03/29/24 1412  DME 3 n 1  Once        03/29/24 1411   03/29/24 1412  DME Bedside commode  Once       Question:  Patient needs a  bedside commode to treat with the following condition  Answer:  Status post left partial knee replacement   03/29/24 1411            Diagnostic Studies: DG Knee Left Port Result Date: 03/29/2024 CLINICAL DATA:  Status post left knee arthroplasty EXAM:  PORTABLE LEFT KNEE - 2 VIEW COMPARISON:  Preop x-ray 01/14/2024. FINDINGS: Knee arthroplasty seen. Cemented femoral and tibial component. Patellar button. Expected alignment. No underlying fracture or dislocation. Soft tissue gas identified including along the joint space. No significant joint effusion. Osteopenia. Imaging was obtained to aid in treatment. IMPRESSION: Acute surgical changes of total knee arthroplasty. Electronically Signed   By: Ranell Bring M.D.   On: 03/29/2024 14:23    Disposition: Discharge disposition: 01-Home or Self Care          Follow-up Information     Jule Ronal CROME, PA-C. Schedule an appointment as soon as possible for a visit in 2 week(s).   Specialty: Orthopedic Surgery Contact information: [address] [place] [address] [phone]                  Signed: Ronal CROME Jule 03/30/2024, 8:19 AM

## 2024-03-30 NOTE — Care Management Obs Status (Signed)
 MEDICARE OBSERVATION STATUS NOTIFICATION   Patient Details  Name: Cristina Sawyer MRN: [id number] Date of Birth: 02-25-1941   Medicare Observation Status Notification Given:  Yes    Jon Cruel 03/30/2024, 9:18 AM

## 2024-03-30 NOTE — Progress Notes (Signed)
 Physical Therapy Treatment Patient Details Name: Cristina Sawyer MRN: [id number] DOB: Oct 12, 1940 Today's Date: 03/30/2024   History of Present Illness Pt is an 83 y.o. F presenting to [facility] on [facility]/29/24 for elective L TKA. PMH is significant for R TKA, OA, cataract, CKD, diverticulosis, dyslipidemia, HTN, GERD, and thyroid  surgery.    PT Comments  Pt progressing well with mobility. Mod I bed mobility. Supervision transfers.  CGA amb 250' with RW. Educated in room on ascend/descend threshold with RW. Pt declined need to practice in stairwell. Pt performed LE HEP. Exercise handouts provided. Plan is for d/c home today with HHPT.     If plan is discharge home, recommend the following: A little help with walking and/or transfers;A little help with bathing/dressing/bathroom;Assist for transportation;Help with stairs or ramp for entrance   Can travel by private vehicle        Equipment Recommendations  None recommended by PT    Recommendations for Other Services       Precautions / Restrictions Precautions Precautions: Fall Recall of Precautions/Restrictions: Intact Restrictions Other Position/Activity Restrictions: reviewed use of blue bone foam     Mobility  Bed Mobility Overal bed mobility: Modified Independent             General bed mobility comments: +rail, HOB elevated    Transfers Overall transfer level: Needs assistance Equipment used: Rolling walker (2 wheels) Transfers: Sit to/from Stand Sit to Stand: Supervision           General transfer comment: increased time to power up    Ambulation/Gait Ambulation/Gait assistance: Contact guard assist Gait Distance (Feet): 250 Feet Assistive device: Rolling walker (2 wheels) Gait Pattern/deviations: Step-through pattern, Step-to pattern, Antalgic Gait velocity: decreased Gait velocity interpretation: <1.31 ft/sec, indicative of household ambulator       Stairs Stairs:  (Educated in room on ascend/descend  threshold with RW. Pt verbalized understanding. Declined need to practice in stairwell.)           Wheelchair Mobility     Tilt Bed    Modified Rankin (Stroke Patients Only)       Balance Overall balance assessment: Needs assistance Sitting-balance support: No upper extremity supported, Feet supported Sitting balance-Leahy Scale: Good     Standing balance support: Bilateral upper extremity supported, During functional activity, Reliant on assistive device for balance Standing balance-Leahy Scale: Poor                              Communication Communication Communication: No apparent difficulties  Cognition Arousal: Alert Behavior During Therapy: WFL for tasks assessed/performed   PT - Cognitive impairments: No apparent impairments                         Following commands: Intact      Cueing Cueing Techniques: Verbal cues  Exercises Total Joint Exercises Ankle Circles/Pumps: AROM, Both, 10 reps, Supine Quad Sets: AROM, Both, 10 reps, Supine Short Arc Quad: AROM, Left, 5 reps, Supine Heel Slides: AROM, Left, 5 reps, Supine Hip ABduction/ADduction: AROM, Both, 5 reps, Supine Straight Leg Raises: AAROM, Left, 5 reps, Supine Goniometric ROM: 0-100 L knee    General Comments General comments (skin integrity, edema, etc.): Pt with c/o nausea, indigestion.      Pertinent Vitals/Pain Pain Assessment Pain Assessment: 0-10 Pain Score: 2  Pain Location: L knee Pain Descriptors / Indicators: Grimacing, Sore Pain Intervention(s): Ice applied, Monitored during session  Home Living                          Prior Function            PT Goals (current goals can now be found in the care plan section) Acute Rehab PT Goals Patient Stated Goal: home Progress towards PT goals: Progressing toward goals    Frequency    7X/week      PT Plan      Co-evaluation              AM-PAC PT 6 Clicks Mobility   Outcome  Measure  Help needed turning from your back to your side while in a flat bed without using bedrails?: A Little Help needed moving from lying on your back to sitting on the side of a flat bed without using bedrails?: A Little Help needed moving to and from a bed to a chair (including a wheelchair)?: A Little Help needed standing up from a chair using your arms (e.g., wheelchair or bedside chair)?: A Little Help needed to walk in hospital room?: A Little Help needed climbing 3-5 steps with a railing? : A Little 6 Click Score: 18    End of Session Equipment Utilized During Treatment: Gait belt Activity Tolerance: Patient tolerated treatment well Patient left: in bed;with call bell/phone within reach Nurse Communication: Mobility status PT Visit Diagnosis: Other abnormalities of gait and mobility (R26.89);Muscle weakness (generalized) (M62.81);Pain Pain - Right/Left: Left Pain - part of body: Knee     Time: 0802-0825 PT Time Calculation (min) (ACUTE ONLY): 23 min  Charges:    $Gait Training: 8-22 mins $Therapeutic Exercise: 8-22 mins PT General Charges $$ ACUTE PT VISIT: 1 Visit                     Sari MATSU., PT  Office # [phone]    Erven Sari Shaker 03/30/2024, 8:54 AM

## 2024-04-02 ENCOUNTER — Telehealth: Payer: Self-pay | Admitting: *Deleted

## 2024-04-02 ENCOUNTER — Other Ambulatory Visit: Payer: Self-pay | Admitting: Surgical

## 2024-04-02 MED ORDER — OXYCODONE-ACETAMINOPHEN 5-325 MG PO TABS: 1.0000 | ORAL_TABLET | Freq: Four times a day (QID) | ORAL | 0 refills | Status: DC | PRN

## 2024-04-02 NOTE — Telephone Encounter (Signed)
 Patient had Left total knee Monday. Dr. Jerri is not available to send in refill. Could you help me? Thank you.

## 2024-04-02 NOTE — Telephone Encounter (Signed)
Thank youl

## 2024-04-02 NOTE — Telephone Encounter (Signed)
 Patient called requesting a refill of pain medication this afternoon.

## 2024-04-02 NOTE — Telephone Encounter (Signed)
 Sent in

## 2024-04-09 ENCOUNTER — Other Ambulatory Visit: Payer: Self-pay | Admitting: Physician Assistant

## 2024-04-09 ENCOUNTER — Telehealth: Payer: Self-pay | Admitting: *Deleted

## 2024-04-09 MED ORDER — OXYCODONE-ACETAMINOPHEN 5-325 MG PO TABS: 1.0000 | ORAL_TABLET | Freq: Three times a day (TID) | ORAL | 0 refills | Status: DC | PRN

## 2024-04-09 NOTE — Telephone Encounter (Signed)
 Patient aware refill has been sent to her pharmacy.

## 2024-04-09 NOTE — Telephone Encounter (Signed)
 sent

## 2024-04-09 NOTE — Telephone Encounter (Signed)
 Patient called this morning requesting refill of pain medication. Thank you.

## 2024-04-13 ENCOUNTER — Ambulatory Visit (INDEPENDENT_AMBULATORY_CARE_PROVIDER_SITE_OTHER): Admitting: Physician Assistant

## 2024-04-13 ENCOUNTER — Other Ambulatory Visit: Payer: Self-pay

## 2024-04-13 ENCOUNTER — Ambulatory Visit

## 2024-04-13 DIAGNOSIS — M25662 Stiffness of left knee, not elsewhere classified: Secondary | ICD-10-CM | POA: Diagnosis not present

## 2024-04-13 DIAGNOSIS — G8929 Other chronic pain: Secondary | ICD-10-CM | POA: Diagnosis not present

## 2024-04-13 DIAGNOSIS — R600 Localized edema: Secondary | ICD-10-CM

## 2024-04-13 DIAGNOSIS — M25562 Pain in left knee: Secondary | ICD-10-CM

## 2024-04-13 DIAGNOSIS — R262 Difficulty in walking, not elsewhere classified: Secondary | ICD-10-CM | POA: Diagnosis not present

## 2024-04-13 DIAGNOSIS — Z96652 Presence of left artificial knee joint: Secondary | ICD-10-CM

## 2024-04-13 MED ORDER — HYDROCODONE-ACETAMINOPHEN 7.5-325 MG PO TABS: 1.0000 | ORAL_TABLET | Freq: Four times a day (QID) | ORAL | 0 refills | Status: DC | PRN

## 2024-04-13 NOTE — Therapy (Signed)
 OUTPATIENT PHYSICAL THERAPY LOWER EXTREMITY EVALUATION   Patient Name: Cristina Sawyer MRN: [id number] DOB:1941/07/01, 83 y.o., female Today's Date: 04/13/2024  END OF SESSION:  PT End of Session - 04/13/24 1226     Visit Number 1    Number of Visits 17    Date for PT Re-Evaluation 07/06/24    PT Start Time 1120    PT Stop Time 1150    PT Time Calculation (min) 30 min    Activity Tolerance Patient tolerated treatment well    Behavior During Therapy [facility] for tasks assessed/performed          Past Medical History:  Diagnosis Date   Arthritis    Cataract    Chronic kidney disease    Diverticulosis    Dyslipidemia    GERD (gastroesophageal reflux disease)    HTN (hypertension) 2015   Personal history of colonic adenoma 08/09/2008   Vitamin D  deficiency    Past Surgical History:  Procedure Laterality Date   ABDOMINAL HYSTERECTOMY  1975   APPENDECTOMY  1975   COLONOSCOPY     HAND SURGERY     Cyst removal   THYROID  SURGERY  2000   to remove nodule   TOTAL KNEE ARTHROPLASTY Right 12/23/2022   Procedure: RIGHT TOTAL KNEE ARTHROPLASTY;  Surgeon: Jerri Kay HERO, MD;  Location: [facility] OR;  Service: Orthopedics;  Laterality: Right;   TOTAL KNEE ARTHROPLASTY Left 03/29/2024   Procedure: LEFT TOTAL KNEE ARTHROPLASTY;  Surgeon: Jerri Kay HERO, MD;  Location: [facility] OR;  Service: Orthopedics;  Laterality: Left;   Patient Active Problem List   Diagnosis Date Noted   Status post total left knee replacement 03/29/2024   Primary osteoarthritis of left knee 03/28/2024   Status post total right knee replacement 12/23/2022   History of GI bleed 06/06/2022   Osteopenia 08/30/2021   Aortic atherosclerosis (HCC) 07/12/2021   Diverticulosis 07/12/2021   GAD (generalized anxiety disorder) 07/05/2021   Personal history of calcium  pyrophosphate deposition disease (CPPD) 04/11/2020   History of vitamin D  deficiency 07/26/2019   Age-related cataract of both eyes 05/19/2018   Hyperlipidemia 05/19/2018    CKD (chronic kidney disease) stage 3, GFR 30-59 ml/min (HCC) 02/14/2018   Essential hypertension 05/12/2017   Transient cerebral ischemia 05/12/2017   Seasonal allergic rhinitis due to pollen 12/29/2014   GERD (gastroesophageal reflux disease) 01/17/2014   History of colonic polyps 08/09/2008    PCP: Joyce Norleen BROCKS, MD   REFERRING PROVIDER: Jerri Kay HERO, MD   REFERRING DIAG: [phone] (ICD-10-CM) - Unilateral primary osteoarthritis, left knee  THERAPY DIAG:  Difficulty in walking, not elsewhere classified  Stiffness of left knee, not elsewhere classified  Localized edema  Chronic pain of left knee  Rationale for Evaluation and Treatment: Rehabilitation  ONSET DATE: 03/29/24 surgery  SUBJECTIVE:   SUBJECTIVE STATEMENT: Pt is s/p TKA on the left on 03/29/24.  Went straight home and had 4 visits with home PT.  Had started with [facility] ambulation with home PT.  She had a post op follow up visit today and sutures were removed and medication switched to hydrocodone .  Pt states she is quite nauseous today.   PERTINENT HISTORY: No previous L knee surgery. PAIN:  Are you having pain? Yes: NPRS scale: 7/10 Pain location: L knee ant/posterior Pain description: dull, achy Aggravating factors: ambulation, bending Relieving factors: medication, ice, elevation  PRECAUTIONS: None  RED FLAGS: None   WEIGHT BEARING RESTRICTIONS: No  FALLS:  Has patient fallen in last 6 months?  No  LIVING ENVIRONMENT: Lives with: lives with their family and lives alone Lives in: House/apartment Stairs: No Has following equipment at home: Single point cane and Environmental consultant - 2 wheeled  OCCUPATION: retired  PLOF: Independent  PATIENT GOALS: Decrease pain with all ADLs  NEXT MD VISIT: @8 /26/25  OBJECTIVE:  Note: Objective measures were completed at Evaluation unless otherwise noted.  DIAGNOSTIC FINDINGS: Pre surgery -Imaging-X-rays demonstrate advanced tricompartmental degenerative changes.  No   acute fracture noted.    PATIENT SURVEYS:  PSFS: THE PATIENT SPECIFIC FUNCTIONAL SCALE  Place score of 0-10 (0 = unable to perform activity and 10 = able to perform activity at the same level as before injury or problem)  Activity Date: 04/13/24    driving 6    2.sleeping 5    3.sit to stand 4    4.      Total Score 5      Total Score = Sum of activity scores/number of activities  Minimally Detectable Change: 3 points (for single activity); 2 points (for average score)  [employer] (nd). The Patient Specific Functional Scale . Retrieved from [email or web address]   COGNITION: Overall cognitive status: Within functional limits for tasks assessed     SENSATION: WFL  EDEMA:    MUSCLE LENGTH: Hamstrings: Right mild restriction ; Left moderate restriction  POSTURE: forward head  PALPATION: 1+ tenderness at joint line and along incision  LOWER EXTREMITY ROM:   AROM/PROM Right eval Left eval  Hip flexion    Hip extension    Hip abduction    Hip adduction    Hip internal rotation    Hip external rotation    Knee flexion  Seated 74/84 Supine  82/85  Knee extension  Supine +5/0  Ankle dorsiflexion    Ankle plantarflexion    Ankle inversion    Ankle eversion     (Blank rows = not tested)  LOWER EXTREMITY MMT:  MMT Right eval Left eval  Hip flexion  4-  Hip extension    Hip abduction  4-  Hip adduction    Hip internal rotation    Hip external rotation    Knee flexion  3+  Knee extension  3+  Ankle dorsiflexion  3+  Ankle plantarflexion    Ankle inversion    Ankle eversion     (Blank rows = not tested)  LOWER EXTREMITY SPECIAL TESTS:  No special tests completed  FUNCTIONAL TESTS:  5 times sit to stand: unable to complete  GAIT: Distance walked: 50 feet Assistive device utilized: Single point cane and Walker - 2 wheeled Level of assistance: SBA                                                                                                                                 TREATMENT DATE:  04/13/24 11:20- 11:50 Evaluation and review of HEP.  Pt to use ice at home instead of clinic due  to feeling nauseous.   PATIENT EDUCATION:  Education details: HEP Person educated: Patient Education method: Solicitor, Actor cues, and Verbal cues Education comprehension: verbalized understanding, returned demonstration, and verbal cues required  HOME EXERCISE PROGRAM: Access Code: [email or web address] URL: [email or web address] Date: 04/13/2024 Prepared by: Burnard Meth  Exercises - Supine Quad Set  - 2 x daily - 7 x weekly - 2 sets - 10 reps - 3 hold - Supine Active Straight Leg Raise  - 2 x daily - 7 x weekly - 2 sets - 10 reps - Supine Heel Slides  - 2 x daily - 7 x weekly - 2 sets - 10 reps - 4 hold - Mini Squat  - 2 x daily - 7 x weekly - 2 sets - 10 reps - 2 hold - Standing Heel Raise with Support  - 2 x daily - 7 x weekly - 2 sets - 10 reps  ASSESSMENT:  CLINICAL IMPRESSION: Patient is a 83 y.o. female who was seen today for physical therapy evaluation and treatment for left knee s/p TKA.  She presents with decreased ROM, decreased strength, altered gait, altered balance, decreased flexibility and pain.  She would benefit from skilled PT to address deficits and return to independent ADLs and community ambulation.  OBJECTIVE IMPAIRMENTS: Abnormal gait, decreased balance, decreased ROM, decreased strength, increased edema, impaired flexibility, and pain.   ACTIVITY LIMITATIONS: bending, sitting, standing, squatting, sleeping, stairs, transfers, bed mobility, and toileting  PARTICIPATION LIMITATIONS: meal prep, cleaning, driving, shopping, and community activity  PERSONAL FACTORS: Age and Fitness are also affecting patient's functional outcome.   REHAB POTENTIAL: Good  CLINICAL DECISION MAKING: Stable/uncomplicated  EVALUATION COMPLEXITY:  Moderate   GOALS: Goals reviewed with patient? Yes  SHORT TERM GOALS: Target date: 05/25/2024   Pt to be independent in HEP. Baseline: Goal status: INITIAL  2.  Decrease pain by 1-2 levels. Baseline:  Goal status: INITIAL  3.  Progress to ambulation with SPC  household distances. Baseline:  Goal status: INITIAL  LONG TERM GOALS: Target date: 07/06/2024    Increase AROM to 0> 110 to allow for improved mobility with sit to stand transfers and ADLs. Baseline: +5>82 Goal status: INITIAL  2.  Increase LLE strength to at least 4/5 throughout.   Baseline: 3/5 Goal status: INITIAL  3.  Able to ambulate short community distances with [facility] (if needed) independently. Baseline:  Goal status: INITIAL  4.  Decrease pain to max 3/10 with rest and ADLs. Baseline:  Goal status: INITIAL  5.  Increase by 2+ levels in PSFS score. Baseline:  Goal status: INITIAL   PLAN:  PT FREQUENCY: 2x/week  PT DURATION: 12 weeks  PLANNED INTERVENTIONS: 97164- PT Re-evaluation, 97110-Therapeutic exercises, 97530- Therapeutic activity, 97112- Neuromuscular re-education, 97535- Self Care, [id number]- Manual therapy, [phone]- Gait training, [phone]- Electrical stimulation (unattended), 97016- Vasopneumatic device, Patient/Family education, Balance training, Stair training, Joint mobilization, Scar mobilization, Cryotherapy, and Moist heat  PLAN FOR NEXT SESSION: Start on Nu [facility] and increase there ex program as tolerated.   Burnard CHRISTELLA Meth, PT 04/13/2024, 12:32 PM

## 2024-04-13 NOTE — Progress Notes (Signed)
 Post-Op Visit Note   Patient: Cristina Sawyer           Date of Birth: [birth year]           MRN: [id number] Visit Date: 04/13/2024 PCP: Joyce Norleen BROCKS, MD   Assessment & Plan:  Chief Complaint:  Chief Complaint  Patient presents with   Left Knee - Routine Post Op   Visit Diagnoses:  1. Status post total left knee replacement     Plan: Patient is a pleasant 83 year old female who comes in today 2 weeks status post left total knee replacement, date of surgery 03/29/2024.  She has been doing okay but has had itching and other side effects from what she thinks is the Percocet.  Otherwise, she has been progressing with home health PT and is ambulating with a cane.  She is scheduled to start outpatient PT today.  She has been compliant taking baby aspirin  twice daily for DVT prophylaxis.  Examination of the left knee reveals a well-healing surgical incision with nylon sutures in place.  No evidence of infection or cellulitis.  Calf soft nontender.  She is neurovascularly intact distally.  Today, sutures were removed and Steri-Strips applied.  We have discussed switching her Percocet to Norco and a new prescription for this has been sent in.  She will continue taking a baby aspirin  twice daily for another 4 weeks.  She will follow-up with us  in 4 weeks for repeat evaluation and 2 view x-rays of the left knee.  Call with concerns or questions.  Follow-Up Instructions: Return in about 4 weeks (around 05/11/2024).   Orders:  No orders of the defined types were placed in this encounter.  Meds ordered this encounter  Medications   HYDROcodone -acetaminophen  (NORCO) 7.5-325 MG tablet    Sig: Take 1-2 tablets by mouth every 6 (six) hours as needed for moderate pain (pain score 4-6).    Dispense:  40 tablet    Refill:  0    Patient is itching from the percocet, so we have agreed to d/c that and switch to norco    Imaging: No new imaging  PMFS History: Patient Active Problem List    Diagnosis Date Noted   Status post total left knee replacement 03/29/2024   Primary osteoarthritis of left knee 03/28/2024   Status post total right knee replacement 12/23/2022   History of GI bleed 06/06/2022   Osteopenia 08/30/2021   Aortic atherosclerosis (HCC) 07/12/2021   Diverticulosis 07/12/2021   GAD (generalized anxiety disorder) 07/05/2021   Personal history of calcium  pyrophosphate deposition disease (CPPD) 04/11/2020   History of vitamin D  deficiency 07/26/2019   Age-related cataract of both eyes 05/19/2018   Hyperlipidemia 05/19/2018   CKD (chronic kidney disease) stage 3, GFR 30-59 ml/min (HCC) 02/14/2018   Essential hypertension 05/12/2017   Transient cerebral ischemia 05/12/2017   Seasonal allergic rhinitis due to pollen 12/29/2014   GERD (gastroesophageal reflux disease) 01/17/2014   History of colonic polyps 08/09/2008   Past Medical History:  Diagnosis Date   Arthritis    Cataract    Chronic kidney disease    Diverticulosis    Dyslipidemia    GERD (gastroesophageal reflux disease)    HTN (hypertension) 2015   Personal history of colonic adenoma 08/09/2008   Vitamin D  deficiency     Family History  Problem Relation Age of Onset   Heart disease Sister    Heart disease Sister        in her 36s  Colon cancer Neg Hx     Past Surgical History:  Procedure Laterality Date   ABDOMINAL HYSTERECTOMY  1975   APPENDECTOMY  1975   COLONOSCOPY     HAND SURGERY     Cyst removal   THYROID  SURGERY  2000   to remove nodule   TOTAL KNEE ARTHROPLASTY Right 12/23/2022   Procedure: RIGHT TOTAL KNEE ARTHROPLASTY;  Surgeon: Jerri Kay HERO, MD;  Location: [facility] OR;  Service: Orthopedics;  Laterality: Right;   TOTAL KNEE ARTHROPLASTY Left 03/29/2024   Procedure: LEFT TOTAL KNEE ARTHROPLASTY;  Surgeon: Jerri Kay HERO, MD;  Location: [facility] OR;  Service: Orthopedics;  Laterality: Left;   Social History   Occupational History   Occupation: Retired from Art therapist  Tobacco  Use   Smoking status: Never   Smokeless tobacco: Never  Vaping Use   Vaping status: Never Used  Substance and Sexual Activity   Alcohol  use: Yes    Alcohol /week: 1.0 standard drink of alcohol     Types: 1 Glasses of wine per week    Comment: socially   Drug use: No   Sexual activity: Yes

## 2024-04-19 ENCOUNTER — Telehealth: Payer: Self-pay | Admitting: *Deleted

## 2024-04-19 ENCOUNTER — Ambulatory Visit: Payer: Self-pay | Admitting: Rehabilitative and Restorative Service Providers"

## 2024-04-19 ENCOUNTER — Other Ambulatory Visit: Payer: Self-pay | Admitting: Physician Assistant

## 2024-04-19 ENCOUNTER — Encounter: Payer: Self-pay | Admitting: Rehabilitative and Restorative Service Providers"

## 2024-04-19 DIAGNOSIS — R262 Difficulty in walking, not elsewhere classified: Secondary | ICD-10-CM | POA: Diagnosis not present

## 2024-04-19 DIAGNOSIS — G8929 Other chronic pain: Secondary | ICD-10-CM | POA: Diagnosis not present

## 2024-04-19 DIAGNOSIS — M25562 Pain in left knee: Secondary | ICD-10-CM | POA: Diagnosis not present

## 2024-04-19 DIAGNOSIS — R600 Localized edema: Secondary | ICD-10-CM | POA: Diagnosis not present

## 2024-04-19 DIAGNOSIS — M25662 Stiffness of left knee, not elsewhere classified: Secondary | ICD-10-CM | POA: Diagnosis not present

## 2024-04-19 MED ORDER — HYDROCODONE-ACETAMINOPHEN 5-325 MG PO TABS: 1.0000 | ORAL_TABLET | Freq: Four times a day (QID) | ORAL | 0 refills | Status: DC | PRN

## 2024-04-19 MED ORDER — OXYCODONE-ACETAMINOPHEN 5-325 MG PO TABS: 1.0000 | ORAL_TABLET | Freq: Three times a day (TID) | ORAL | 0 refills | Status: DC | PRN

## 2024-04-19 MED ORDER — METHOCARBAMOL 500 MG PO TABS: 500.0000 mg | ORAL_TABLET | Freq: Two times a day (BID) | ORAL | 2 refills | Status: DC | PRN

## 2024-04-19 NOTE — Therapy (Signed)
 OUTPATIENT PHYSICAL THERAPY TREATMENT   Patient Name: Cristina Sawyer MRN: [id number] DOB:1941-06-11, 83 y.o., female Today's Date: 04/19/2024  END OF SESSION:  PT End of Session - 04/19/24 1024     Visit Number 2    Number of Visits 17    Date for PT Re-Evaluation 07/06/24    Authorization Type [employer]    Progress Note Due on Visit 10    PT Start Time 1017    PT Stop Time 1101    PT Time Calculation (min) 44 min    Activity Tolerance Patient tolerated treatment well    Behavior During Therapy WFL for tasks assessed/performed           Past Medical History:  Diagnosis Date   Arthritis    Cataract    Chronic kidney disease    Diverticulosis    Dyslipidemia    GERD (gastroesophageal reflux disease)    HTN (hypertension) 2015   Personal history of colonic adenoma 08/09/2008   Vitamin D  deficiency    Past Surgical History:  Procedure Laterality Date   ABDOMINAL HYSTERECTOMY  1975   APPENDECTOMY  1975   COLONOSCOPY     HAND SURGERY     Cyst removal   THYROID  SURGERY  2000   to remove nodule   TOTAL KNEE ARTHROPLASTY Right 12/23/2022   Procedure: RIGHT TOTAL KNEE ARTHROPLASTY;  Surgeon: Jerri Kay HERO, MD;  Location: [facility] OR;  Service: Orthopedics;  Laterality: Right;   TOTAL KNEE ARTHROPLASTY Left 03/29/2024   Procedure: LEFT TOTAL KNEE ARTHROPLASTY;  Surgeon: Jerri Kay HERO, MD;  Location: [facility] OR;  Service: Orthopedics;  Laterality: Left;   Patient Active Problem List   Diagnosis Date Noted   Status post total left knee replacement 03/29/2024   Primary osteoarthritis of left knee 03/28/2024   Status post total right knee replacement 12/23/2022   History of GI bleed 06/06/2022   Osteopenia 08/30/2021   Aortic atherosclerosis (HCC) 07/12/2021   Diverticulosis 07/12/2021   GAD (generalized anxiety disorder) 07/05/2021   Personal history of calcium  pyrophosphate deposition disease (CPPD) 04/11/2020   History of vitamin D  deficiency 07/26/2019   Age-related  cataract of both eyes 05/19/2018   Hyperlipidemia 05/19/2018   CKD (chronic kidney disease) stage 3, GFR 30-59 ml/min (HCC) 02/14/2018   Essential hypertension 05/12/2017   Transient cerebral ischemia 05/12/2017   Seasonal allergic rhinitis due to pollen 12/29/2014   GERD (gastroesophageal reflux disease) 01/17/2014   History of colonic polyps 08/09/2008    PCP: Joyce Norleen BROCKS, MD   REFERRING PROVIDER: Jerri Kay HERO, MD  REFERRING DIAG: [phone] (ICD-10-CM) - Unilateral primary osteoarthritis, left knee  THERAPY DIAG:  Difficulty in walking, not elsewhere classified  Stiffness of left knee, not elsewhere classified  Localized edema  Chronic pain of left knee  Rationale for Evaluation and Treatment: Rehabilitation  ONSET DATE: 03/29/24 surgery  SUBJECTIVE:   SUBJECTIVE STATEMENT: Pt indicated having some complaints moderate in knee.  Pt indicated feeling a little loopy.   PERTINENT HISTORY: No previous Lt knee surgery.   PAIN:   NPRS scale: around 4/10 Pain location: Lt knee ant/posterior Pain description: dull, achy Aggravating factors: ambulation, bending Relieving factors: medication, ice, elevation  PRECAUTIONS: None  RED FLAGS: None   WEIGHT BEARING RESTRICTIONS: No  FALLS:  Has patient fallen in last 6 months? No  LIVING ENVIRONMENT: Lives with: lives with their family and lives alone Lives in: House/apartment Stairs: No Has following equipment at home: Single point cane and Environmental consultant -  2 wheeled  OCCUPATION: retired  PLOF: Independent  PATIENT GOALS: Decrease pain with all ADLs  NEXT MD VISIT: 05/11/24  OBJECTIVE:  Note: Objective measures were completed at Evaluation unless otherwise noted.  DIAGNOSTIC FINDINGS: Pre surgery -Imaging-X-rays demonstrate advanced tricompartmental degenerative changes.  No  acute fracture noted.    PATIENT SURVEYS:  PSFS: THE PATIENT SPECIFIC FUNCTIONAL SCALE  Place score of 0-10 (0 = unable to perform  activity and 10 = able to perform activity at the same level as before injury or problem)  Activity Date: 04/13/24    driving 6    2.sleeping 5    3.sit to stand 4    4.      Total Score 5      Total Score = Sum of activity scores/number of activities  Minimally Detectable Change: 3 points (for single activity); 2 points (for average score)   COGNITION: 04/13/2024 Overall cognitive status: Within functional limits for tasks assessed     SENSATION: 04/13/2024 [facility]  EDEMA:  04/13/2024 Not specifically recorded   MUSCLE LENGTH: 04/13/2024 Hamstrings: Right mild restriction ; Left moderate restriction  POSTURE:  04/13/2024 forward head  PALPATION: 04/13/2024 1+ tenderness at joint line and along incision  LOWER EXTREMITY ROM:   AROM/PROM Right eval Left Eval 04/13/2024 Left 04/19/2024  Hip flexion     Hip extension     Hip abduction     Hip adduction     Hip internal rotation     Hip external rotation     Knee flexion  Seated 74/84 Supine  82/85 85 supine AROM heel slide  Knee extension  Supine +5/0   Ankle dorsiflexion     Ankle plantarflexion     Ankle inversion     Ankle eversion      (Blank rows = not tested)  LOWER EXTREMITY MMT:  MMT Right eval Left Eval 04/13/2024  Hip flexion  4-  Hip extension    Hip abduction  4-  Hip adduction    Hip internal rotation    Hip external rotation    Knee flexion  3+  Knee extension  3+  Ankle dorsiflexion  3+  Ankle plantarflexion    Ankle inversion    Ankle eversion     (Blank rows = not tested)  LOWER EXTREMITY SPECIAL TESTS:  04/13/2024 No special tests completed  FUNCTIONAL TESTS:  04/13/2024 5 times sit to stand: unable to complete  GAIT: 04/19/2024:   Ambulation into clinic c SPC in Rt UE.   04/13/2024 Distance walked: 50 feet Assistive device utilized: Single point cane and Walker - 2 wheeled Level of assistance: SBA                     TREATMENT         DATE: 04/19/2024 Therex:    Seated Lt knee AROM LAQ with end range pauses each direction x 10, 2 lbs x 15 Supine heel slide to quad set combo Lt leg 5 sec hold each way x 10  Supine Lt leg LAQ in 90 deg knee flexion x 10 c cues for home use Nustep Lvl 5 for ROM 8 mins  Self Care Edema reduction education with muscle pump description, elevation, icing.  Cues for techniques and positioning.  Discussed ankle pumps with icing as well.  Performed in part during vaso use.    Manual: Seated Lt knee flexion c distraction/IR mobilization with movement.   Vaso 10 mins Lt knee in elevation 34  degrees medium compression                                                                                                                             TREATMENT         DATE: 04/13/24  Evaluation and review of HEP.  Pt to use ice at home instead of clinic due to feeling nauseous.   PATIENT EDUCATION:  Education details: HEP Person educated: Patient Education method: Solicitor, Actor cues, and Verbal cues Education comprehension: verbalized understanding, returned demonstration, and verbal cues required  HOME EXERCISE PROGRAM: Access Code: [email or web address] URL: [email or web address] Date: 04/13/2024 Prepared by: Burnard Meth  Exercises - Supine Quad Set  - 2 x daily - 7 x weekly - 2 sets - 10 reps - 3 hold - Supine Active Straight Leg Raise  - 2 x daily - 7 x weekly - 2 sets - 10 reps - Supine Heel Slides  - 2 x daily - 7 x weekly - 2 sets - 10 reps - 4 hold - Mini Squat  - 2 x daily - 7 x weekly - 2 sets - 10 reps - 2 hold - Standing Heel Raise with Support  - 2 x daily - 7 x weekly - 2 sets - 10 reps  ASSESSMENT:  CLINICAL IMPRESSION: End range for flexion was not firm, but limited by pain complaints primarily.  Continued skilled PT services warranted at this time to address strength, mobility and balance impairments.   OBJECTIVE IMPAIRMENTS: Abnormal gait, decreased balance, decreased ROM,  decreased strength, increased edema, impaired flexibility, and pain.   ACTIVITY LIMITATIONS: bending, sitting, standing, squatting, sleeping, stairs, transfers, bed mobility, and toileting  PARTICIPATION LIMITATIONS: meal prep, cleaning, driving, shopping, and community activity  PERSONAL FACTORS: Age and Fitness are also affecting patient's functional outcome.   REHAB POTENTIAL: Good  CLINICAL DECISION MAKING: Stable/uncomplicated  EVALUATION COMPLEXITY: Moderate   GOALS: Goals reviewed with patient? Yes  SHORT TERM GOALS: Target date: 05/25/2024   Pt to be independent in HEP. Baseline: Goal status: on going 04/19/2024  2.  Decrease pain by 1-2 levels. Baseline:  Goal status: on going 04/19/2024  3.  Progress to ambulation with SPC  household distances. Baseline:  Goal status: on going 04/19/2024  LONG TERM GOALS: Target date: 07/06/2024    Increase AROM to 0> 110 to allow for improved mobility with sit to stand transfers and ADLs. Baseline: +5>82 Goal status: INITIAL  2.  Increase LLE strength to at least 4/5 throughout.   Baseline: 3/5 Goal status: INITIAL  3.  Able to ambulate short community distances with [facility] (if needed) independently. Baseline:  Goal status: INITIAL  4.  Decrease pain to max 3/10 with rest and ADLs. Baseline:  Goal status: INITIAL  5.  Increase by 2+ levels in PSFS score. Baseline:  Goal status: INITIAL   PLAN:  PT FREQUENCY: 2x/week  PT DURATION: 12 weeks  PLANNED INTERVENTIONS: 97164- PT Re-evaluation, 97110-Therapeutic exercises,  02469- Therapeutic activity, [id number]- Neuromuscular re-education, [id number]- Self Care, [id number]- Manual therapy, [id number]- Gait training, [phone]- Electrical stimulation (unattended), [phone]- Vasopneumatic device, Patient/Family education, Balance training, Stair training, Joint mobilization, Scar mobilization, Cryotherapy, and Moist heat  PLAN FOR NEXT SESSION: Strengthening, flexion mobility gains.    Ozell Silvan,  PT, DPT, OCS, ATC 04/19/24  10:57 AM

## 2024-04-19 NOTE — Telephone Encounter (Signed)
 sent

## 2024-04-19 NOTE — Telephone Encounter (Signed)
 I sent [name] a pain medication refill request earlier today and she sent Oxycodone , but that was changed last week to Norco. The patient did not pick up. Could you help me get her a refill of Norco sent to her pharmacy please and I'll tell her not to pick up the Oxy or if you could cancel it. Thank you. They are not in office this afternoon after surgeries.

## 2024-04-19 NOTE — Telephone Encounter (Signed)
 Patient was taken off Oxycodone  and you sent Norco last week. This is what she wanted instead. Could we cancel the Oxycodone  and send in Norco instead. Thank you.

## 2024-04-19 NOTE — Telephone Encounter (Signed)
 Patient called and would like refill of pain medication and muscle relaxer. Thank you.

## 2024-04-20 NOTE — Telephone Encounter (Signed)
 Patient made aware last night that her new medication was sent.

## 2024-04-20 NOTE — Telephone Encounter (Signed)
 Thank you so much

## 2024-04-22 ENCOUNTER — Ambulatory Visit: Admitting: Rehabilitative and Restorative Service Providers"

## 2024-04-22 ENCOUNTER — Encounter: Payer: Self-pay | Admitting: Rehabilitative and Restorative Service Providers"

## 2024-04-22 DIAGNOSIS — G8929 Other chronic pain: Secondary | ICD-10-CM

## 2024-04-22 DIAGNOSIS — M25662 Stiffness of left knee, not elsewhere classified: Secondary | ICD-10-CM | POA: Diagnosis not present

## 2024-04-22 DIAGNOSIS — M25562 Pain in left knee: Secondary | ICD-10-CM

## 2024-04-22 DIAGNOSIS — R600 Localized edema: Secondary | ICD-10-CM | POA: Diagnosis not present

## 2024-04-22 DIAGNOSIS — R262 Difficulty in walking, not elsewhere classified: Secondary | ICD-10-CM | POA: Diagnosis not present

## 2024-04-22 NOTE — Therapy (Addendum)
 OUTPATIENT PHYSICAL THERAPY TREATMENT   Patient Name: Cristina Sawyer MRN: [id number] DOB:Mar 20, 1941, 83 y.o., female Today's Date: 04/22/2024  END OF SESSION:  PT End of Session - 04/22/24 1531     Visit Number 3    Number of Visits 17    Date for PT Re-Evaluation 07/06/24    Authorization Type [employer]    Progress Note Due on Visit 10    PT Start Time 1444    PT Stop Time 1533    PT Time Calculation (min) 49 min    Activity Tolerance Patient tolerated treatment well    Behavior During Therapy [facility] for tasks assessed/performed            Past Medical History:  Diagnosis Date   Arthritis    Cataract    Chronic kidney disease    Diverticulosis    Dyslipidemia    GERD (gastroesophageal reflux disease)    HTN (hypertension) 2015   Personal history of colonic adenoma 08/09/2008   Vitamin D  deficiency    Past Surgical History:  Procedure Laterality Date   ABDOMINAL HYSTERECTOMY  1975   APPENDECTOMY  1975   COLONOSCOPY     HAND SURGERY     Cyst removal   THYROID  SURGERY  2000   to remove nodule   TOTAL KNEE ARTHROPLASTY Right 12/23/2022   Procedure: RIGHT TOTAL KNEE ARTHROPLASTY;  Surgeon: Jerri Kay HERO, MD;  Location: [facility] OR;  Service: Orthopedics;  Laterality: Right;   TOTAL KNEE ARTHROPLASTY Left 03/29/2024   Procedure: LEFT TOTAL KNEE ARTHROPLASTY;  Surgeon: Jerri Kay HERO, MD;  Location: [facility] OR;  Service: Orthopedics;  Laterality: Left;   Patient Active Problem List   Diagnosis Date Noted   Status post total left knee replacement 03/29/2024   Primary osteoarthritis of left knee 03/28/2024   Status post total right knee replacement 12/23/2022   History of GI bleed 06/06/2022   Osteopenia 08/30/2021   Aortic atherosclerosis (HCC) 07/12/2021   Diverticulosis 07/12/2021   GAD (generalized anxiety disorder) 07/05/2021   Personal history of calcium  pyrophosphate deposition disease (CPPD) 04/11/2020   History of vitamin D  deficiency 07/26/2019   Age-related  cataract of both eyes 05/19/2018   Hyperlipidemia 05/19/2018   CKD (chronic kidney disease) stage 3, GFR 30-59 ml/min (HCC) 02/14/2018   Essential hypertension 05/12/2017   Transient cerebral ischemia 05/12/2017   Seasonal allergic rhinitis due to pollen 12/29/2014   GERD (gastroesophageal reflux disease) 01/17/2014   History of colonic polyps 08/09/2008    PCP: Joyce Norleen BROCKS, MD   REFERRING PROVIDER: Jerri Kay HERO, MD  REFERRING DIAG: [phone] (ICD-10-CM) - Unilateral primary osteoarthritis, left knee  THERAPY DIAG:  Difficulty in walking, not elsewhere classified  Stiffness of left knee, not elsewhere classified  Localized edema  Chronic pain of left knee  Rationale for Evaluation and Treatment: Rehabilitation  ONSET DATE: 03/29/24 surgery  SUBJECTIVE:   SUBJECTIVE STATEMENT: Overall doing good and no new changes. Ambulation with cane going well.    PERTINENT HISTORY: No previous Lt knee surgery.   PAIN:   NPRS scale: at worst, 6-7/10 when missed medication  Pain location: Lt knee ant/posterior Pain description: dull, achy Aggravating factors: ambulation, bending Relieving factors: medication, ice, elevation  PRECAUTIONS: None  RED FLAGS: None   WEIGHT BEARING RESTRICTIONS: No  FALLS:  Has patient fallen in last 6 months? No  LIVING ENVIRONMENT: Lives with: lives with their family and lives alone Lives in: House/apartment Stairs: No Has following equipment at home: Single  point cane and Walker - 2 wheeled  OCCUPATION: retired  PLOF: Independent  PATIENT GOALS: Decrease pain with all ADLs  NEXT MD VISIT: 05/11/24  OBJECTIVE:  Note: Objective measures were completed at Evaluation unless otherwise noted.  DIAGNOSTIC FINDINGS: Pre surgery -Imaging-X-rays demonstrate advanced tricompartmental degenerative changes.  No  acute fracture noted.    PATIENT SURVEYS:  PSFS: THE PATIENT SPECIFIC FUNCTIONAL SCALE  Place score of 0-10 (0 = unable to  perform activity and 10 = able to perform activity at the same level as before injury or problem)  Activity Date: 04/13/24    driving 6    2.sleeping 5    3.sit to stand 4    4.      Total Score 5      Total Score = Sum of activity scores/number of activities  Minimally Detectable Change: 3 points (for single activity); 2 points (for average score)   COGNITION: 04/13/2024 Overall cognitive status: Within functional limits for tasks assessed     SENSATION: 04/13/2024 [facility]  EDEMA:  04/13/2024 Not specifically recorded   MUSCLE LENGTH: 04/13/2024 Hamstrings: Right mild restriction ; Left moderate restriction  POSTURE:  04/13/2024 forward head  PALPATION: 04/13/2024 1+ tenderness at joint line and along incision  LOWER EXTREMITY ROM:   AROM/PROM Right eval Left Eval 04/13/2024 Left 04/19/2024  Hip flexion     Hip extension     Hip abduction     Hip adduction     Hip internal rotation     Hip external rotation     Knee flexion  Seated 74/84 Supine  82/85 85 supine AROM heel slide  Knee extension  Supine +5/0   Ankle dorsiflexion     Ankle plantarflexion     Ankle inversion     Ankle eversion      (Blank rows = not tested)  LOWER EXTREMITY MMT:  MMT Right eval Left Eval 04/13/2024  Hip flexion  4-  Hip extension    Hip abduction  4-  Hip adduction    Hip internal rotation    Hip external rotation    Knee flexion  3+  Knee extension  3+  Ankle dorsiflexion  3+  Ankle plantarflexion    Ankle inversion    Ankle eversion     (Blank rows = not tested)  LOWER EXTREMITY SPECIAL TESTS:  04/13/2024 No special tests completed  FUNCTIONAL TESTS:  04/13/2024 5 times sit to stand: unable to complete  GAIT: 04/19/2024:   Ambulation into clinic c SPC in Rt UE.   04/13/2024 Distance walked: 50 feet Assistive device utilized: Single point cane and Walker - 2 wheeled Level of assistance: SBA                    TREATMENT         DATE:  04/22/2024 Therex:   NuStep lvl 5 for ROM, 8 minutes Seated knee extension/flexion with overpressure by the opposite leg, 1x10 LAQ w/ opposite knee flexion, 1x10 Supine 90/90 position allowing gravity to pull knee into flexion, 6x30 seconds Quad sets with heel prop, 2x10  Manual  Rocking Lt LE into flexion and extension   Vaso 10 mins Lt knee in elevation 34 degrees medium compression   TREATMENT         DATE: 04/19/2024 Therex:   Seated Lt knee AROM LAQ with end range pauses each direction x 10, 2 lbs x 15 Supine heel slide to quad set combo Lt leg 5 sec hold  each way x 10  Supine Lt leg LAQ in 90 deg knee flexion x 10 c cues for home use Nustep Lvl 5 for ROM 8 mins  Self Care Edema reduction education with muscle pump description, elevation, icing.  Cues for techniques and positioning.  Discussed ankle pumps with icing as well.  Performed in part during vaso use.    Manual: Seated Lt knee flexion c distraction/IR mobilization with movement.   Vaso 10 mins Lt knee in elevation 34 degrees medium compression                                                                                                                             TREATMENT         DATE: 04/13/24  Evaluation and review of HEP.  Pt to use ice at home instead of clinic due to feeling nauseous.   PATIENT EDUCATION:  Education details: HEP Person educated: Patient Education method: Solicitor, Actor cues, and Verbal cues Education comprehension: verbalized understanding, returned demonstration, and verbal cues required  HOME EXERCISE PROGRAM: Access Code: [email or web address] URL: [email or web address] Date: 04/13/2024 Prepared by: Burnard Meth  Exercises - Supine Quad Set  - 2 x daily - 7 x weekly - 2 sets - 10 reps - 3 hold - Supine Active Straight Leg Raise  - 2 x daily - 7 x weekly - 2 sets - 10 reps - Supine Heel Slides  - 2 x daily - 7 x weekly - 2 sets - 10 reps - 4 hold - Mini  Squat  - 2 x daily - 7 x weekly - 2 sets - 10 reps - 2 hold - Standing Heel Raise with Support  - 2 x daily - 7 x weekly - 2 sets - 10 reps  ASSESSMENT:  CLINICAL IMPRESSION:  Patient tolerated treatment well today. Flexion and extension ROM exercises were painful but patient was willing to push through. She understands the importance of regaining knee extension/flexion ROM. Patient will continue to benefit from skilled PT to address impairments.   OBJECTIVE IMPAIRMENTS: Abnormal gait, decreased balance, decreased ROM, decreased strength, increased edema, impaired flexibility, and pain.   ACTIVITY LIMITATIONS: bending, sitting, standing, squatting, sleeping, stairs, transfers, bed mobility, and toileting  PARTICIPATION LIMITATIONS: meal prep, cleaning, driving, shopping, and community activity  PERSONAL FACTORS: Age and Fitness are also affecting patient's functional outcome.   REHAB POTENTIAL: Good  CLINICAL DECISION MAKING: Stable/uncomplicated  EVALUATION COMPLEXITY: Moderate   GOALS: Goals reviewed with patient? Yes  SHORT TERM GOALS: Target date: 05/25/2024   Pt to be independent in HEP. Baseline: Goal status: on going 04/19/2024  2.  Decrease pain by 1-2 levels. Baseline:  Goal status: on going 04/19/2024  3.  Progress to ambulation with SPC  household distances. Baseline:  Goal status: on going 04/19/2024  LONG TERM GOALS: Target date: 07/06/2024    Increase AROM to 0> 110 to allow for improved mobility with sit to stand transfers and  ADLs. Baseline: +5>82 Goal status: ongoing, 04/22/2024  2.  Increase LLE strength to at least 4/5 throughout.   Baseline: 3/5 Goal status: ongoing, 04/22/2024  3.  Able to ambulate short community distances with [facility] (if needed) independently. Baseline:  Goal status: ongoing, 04/22/2024  4.  Decrease pain to max 3/10 with rest and ADLs. Baseline:  Goal status: ongoing, 04/22/2024  5.  Increase by 2+ levels in PSFS score. Baseline:  Goal  status: ongoing, 04/22/2024   PLAN:  PT FREQUENCY: 2x/week  PT DURATION: 12 weeks  PLANNED INTERVENTIONS: 97164- PT Re-evaluation, 97110-Therapeutic exercises, 97530- Therapeutic activity, 97112- Neuromuscular re-education, 97535- Self Care, [id number]- Manual therapy, [phone]- Gait training, [phone]- Electrical stimulation (unattended), 97016- Vasopneumatic device, Patient/Family education, Balance training, Stair training, Joint mobilization, Scar mobilization, Cryotherapy, and Moist heat  PLAN FOR NEXT SESSION:  Flexion/extension ROM gains, continue strengthening.    Ismael Theophilus Stallion, SPT 04/22/24  3:36 PM

## 2024-04-23 ENCOUNTER — Telehealth: Payer: Self-pay | Admitting: *Deleted

## 2024-04-23 ENCOUNTER — Other Ambulatory Visit: Payer: Self-pay | Admitting: Physician Assistant

## 2024-04-23 MED ORDER — HYDROCODONE-ACETAMINOPHEN 5-325 MG PO TABS: 1.0000 | ORAL_TABLET | Freq: Three times a day (TID) | ORAL | 0 refills | Status: AC | PRN

## 2024-04-23 NOTE — Telephone Encounter (Signed)
 Really need to wean to tid at this point.  Sent in refill

## 2024-04-23 NOTE — Telephone Encounter (Signed)
 Patient calling requesting refill of Hydrocodone  please. Thanks.

## 2024-04-23 NOTE — Telephone Encounter (Signed)
 Will reach out to patient and update of new medication dosage/instructions.

## 2024-04-26 ENCOUNTER — Ambulatory Visit

## 2024-04-26 DIAGNOSIS — Z0000 Encounter for general adult medical examination without abnormal findings: Secondary | ICD-10-CM | POA: Diagnosis not present

## 2024-04-26 NOTE — Patient Instructions (Signed)
 Ms. Cristina Sawyer , Thank you for taking time out of your busy schedule to complete your Annual Wellness Visit with me. I enjoyed our conversation and look forward to speaking with you again next year. I, as well as your care team,  appreciate your ongoing commitment to your health goals. Please review the following plan we discussed and let me know if I can assist you in the future. Your Game plan/ To Do List    Referrals: If you haven't heard from the office you've been referred to, please reach out to them at the phone provided.   Follow up Visits: We will see or speak with you next year for your Next Medicare AWV with our clinical staff Have you seen your provider in the last 6 months (3 months if uncontrolled diabetes)? Yes  Clinician Recommendations:  Aim for 30 minutes of exercise or brisk walking, 6-8 glasses of water, and 5 servings of fruits and vegetables each day.       This is a list of the screenings recommended for you:  Health Maintenance  Topic Date Due   DTaP/Tdap/Td vaccine (2 - Td or Tdap) 07/13/2018   Colon Cancer Screening  03/16/2019   COVID-19 Vaccine (7 - 2024-25 season) 08/27/2023   Flu Shot  04/16/2024   Medicare Annual Wellness Visit  04/26/2025   Pneumococcal Vaccine for age over 7  Completed   DEXA scan (bone density measurement)  Completed   Zoster (Shingles) Vaccine  Completed   Hepatitis B Vaccine  Aged Out   HPV Vaccine  Aged Out   Meningitis B Vaccine  Aged Out   Hepatitis C Screening  Discontinued    Advanced directives: (ACP Link)Information on Advanced Care Planning can be found at [place]  Secretary of [facility] Advance Health Care Directives Advance Health Care Directives. [email or web address]  Advance Care Planning is important because it:  [x]  Makes sure you receive the medical care that is consistent with your values, goals, and preferences  [x]  It provides guidance to your family and loved ones and reduces their decisional burden about whether or not  they are making the right decisions based on your wishes.  Follow the link provided in your after visit summary or read over the paperwork we have mailed to you to help you started getting your Advance Directives in place. If you need assistance in completing these, please reach out to us  so that we can help you!  See attachments for Preventive Care and Fall Prevention Tips.

## 2024-04-26 NOTE — Progress Notes (Signed)
 Subjective:   Cristina Sawyer is a 83 y.o. who presents for a Medicare Wellness preventive visit.  As a reminder, Annual Wellness Visits don't include a physical exam, and some assessments may be limited, especially if this visit is performed virtually. We may recommend an in-person follow-up visit with your provider if needed.  Visit Complete: Virtual I connected with  Cristina Sawyer on 04/26/24 by a audio enabled telemedicine application and verified that I am speaking with the correct person using two identifiers.  Patient Location: Home  Provider Location: Office/Clinic  I discussed the limitations of evaluation and management by telemedicine. The patient expressed understanding and agreed to proceed.  Vital Signs: Because this visit was a virtual/telehealth visit, some criteria may be missing or patient reported. Any vitals not documented were not able to be obtained and vitals that have been documented are patient reported.  VideoError- Librarian, academic were attempted between this provider and patient, however failed, due to patient having technical difficulties OR patient did not have access to video capability.  We continued and completed visit with audio only.   Persons Participating in Visit: Patient.  AWV Questionnaire: No: Patient Medicare AWV questionnaire was not completed prior to this visit.  Cardiac Risk Factors include: advanced age (>52men, >17 women);dyslipidemia;hypertension     Objective:    Today's Vitals   04/26/24 1336  PainSc: 4    There is no height or weight on file to calculate BMI.     04/26/2024    1:43 PM 04/13/2024   11:19 AM 03/16/2024    1:20 PM 05/06/2023    1:32 PM 01/07/2023    1:51 PM 12/23/2022    9:04 AM 12/12/2022    1:09 PM  Advanced Directives  Does Patient Have a Medical Advance Directive? No No No No No No No  Would patient like information on creating a medical advance directive? No - Patient  declined Yes (MAU/Ambulatory/Procedural Areas - Information given) No - Patient declined  No - Patient declined No - Patient declined No - Patient declined    Current Medications (verified) Outpatient Encounter Medications as of 04/26/2024  Medication Sig   amLODipine  (NORVASC ) 10 MG tablet Take 1 tablet (10 mg total) by mouth daily.   aspirin  (ASPIRIN  81) 81 MG chewable tablet Chew 1 tablet (81 mg total) by mouth 2 (two) times daily.   cyanocobalamin (VITAMIN B12) 500 MCG tablet Take 500 mcg by mouth 3 (three) times a week.   docusate sodium  (COLACE) 100 MG capsule Take 1 capsule (100 mg total) by mouth daily as needed.   HYDROcodone -acetaminophen  (NORCO/VICODIN) 5-325 MG tablet Take 1-2 tablets by mouth 3 (three) times daily as needed for up to 5 days for moderate pain (pain score 4-6) or severe pain (pain score 7-10).   methocarbamol  (ROBAXIN ) 500 MG tablet Take 1 tablet (500 mg total) by mouth 2 (two) times daily as needed.   Multiple Vitamin (MULTI-VITAMIN DAILY PO) Take 1 tablet by mouth 3 (three) times a week.   ondansetron  (ZOFRAN ) 4 MG tablet Take 1 tablet (4 mg total) by mouth every 8 (eight) hours as needed for nausea or vomiting.   pantoprazole  (PROTONIX ) 40 MG tablet Take 1 tablet (40 mg total) by mouth daily. Needs office visit. (Patient taking differently: Take 40 mg by mouth daily as needed (acid reflux).)   Polyethyl Glycol-Propyl Glycol (SYSTANE OP) Place 1 drop into both eyes 2 (two) times daily as needed (dry eyes).   rosuvastatin  (  CRESTOR ) 5 MG tablet Take 1 tablet (5 mg total) by mouth daily.   VITAMIN D  PO Take 2,000 Units by mouth daily.   ondansetron  (ZOFRAN ) 4 MG tablet Take 1 tablet (4 mg total) by mouth every 8 (eight) hours as needed for nausea or vomiting. (Patient not taking: Reported on 04/26/2024)   [DISCONTINUED] omeprazole  (PRILOSEC OTC) 20 MG tablet Take 20 mg by mouth daily.     No facility-administered encounter medications on file as of 04/26/2024.     Allergies (verified) Codeine and Ciprofloxacin  hcl   History: Past Medical History:  Diagnosis Date   Arthritis    Cataract    Chronic kidney disease    Diverticulosis    Dyslipidemia    GERD (gastroesophageal reflux disease)    HTN (hypertension) 2015   Personal history of colonic adenoma 08/09/2008   Vitamin D  deficiency    Past Surgical History:  Procedure Laterality Date   ABDOMINAL HYSTERECTOMY  1975   APPENDECTOMY  1975   COLONOSCOPY     HAND SURGERY     Cyst removal   THYROID  SURGERY  2000   to remove nodule   TOTAL KNEE ARTHROPLASTY Right 12/23/2022   Procedure: RIGHT TOTAL KNEE ARTHROPLASTY;  Surgeon: Jerri Kay HERO, MD;  Location: [facility] OR;  Service: Orthopedics;  Laterality: Right;   TOTAL KNEE ARTHROPLASTY Left 03/29/2024   Procedure: LEFT TOTAL KNEE ARTHROPLASTY;  Surgeon: Jerri Kay HERO, MD;  Location: [facility] OR;  Service: Orthopedics;  Laterality: Left;   Family History  Problem Relation Age of Onset   Heart disease Sister    Heart disease Sister        in her 80s   Colon cancer Neg Hx    Social History   Socioeconomic History   Marital status: Widowed    Spouse name: Not on file   Number of children: Not on file   Years of education: Not on file   Highest education level: Not on file  Occupational History   Occupation: Retired from Community education officer and [employer]  Tobacco Use   Smoking status: Never   Smokeless tobacco: Never  Vaping Use   Vaping status: Never Used  Substance and Sexual Activity   Alcohol  use: Not Currently    Alcohol /week: 1.0 standard drink of alcohol     Types: 1 Glasses of wine per week   Drug use: Yes    Types: Hydrocodone    Sexual activity: Yes  Other Topics Concern   Not on file  Social History Narrative   Pt lives alone.    Social Drivers of Corporate investment banker Strain: Low Risk  (04/26/2024)   Overall Financial Resource Strain (CARDIA)    Difficulty of Paying Living Expenses: Not hard at all  Food Insecurity: No Food  Insecurity (04/26/2024)   Hunger Vital Sign    Worried About Running Out of Food in the Last Year: Never true    Ran Out of Food in the Last Year: Never true  Transportation Needs: No Transportation Needs (04/26/2024)   PRAPARE - Administrator, Civil Service (Medical): No    Lack of Transportation (Non-Medical): No  Physical Activity: Insufficiently Active (04/26/2024)   Exercise Vital Sign    Days of Exercise per Week: 2 days    Minutes of Exercise per Session: 50 min  Stress: No Stress Concern Present (04/26/2024)   [employer] of Occupational Health - Occupational Stress Questionnaire    Feeling of Stress: Not at all  Social Connections:  Socially Isolated (04/26/2024)   Social Connection and Isolation Panel    Frequency of Communication with Friends and Family: More than three times a week    Frequency of Social Gatherings with Friends and Family: Three times a week    Attends Religious Services: Never    Active Member of Clubs or Organizations: No    Attends Banker Meetings: Never    Marital Status: Widowed    Tobacco Counseling Counseling given: Not Answered    Clinical Intake:  Pre-visit preparation completed: Yes  Pain : 0-10 Pain Score: 4  Pain Type: Other (Comment) (surgical pain) Pain Location: Knee Pain Orientation: Left Pain Descriptors / Indicators: Aching Pain Onset: 1 to 4 weeks ago Pain Frequency: Constant     Nutritional Risks: Nausea/ vomitting/ diarrhea (a little nausea) Diabetes: No  Lab Results  Component Value Date   HGBA1C 5.7 (H) 02/14/2018   HGBA1C 5.6 05/04/2017   HGBA1C  05/18/2008    5.7 (NOTE)   The ADA recommends the following therapeutic goal for glycemic   control related to Hgb A1C measurement:   Goal of Therapy:   < 7.0% Hgb A1C   Reference: American Diabetes Association: Clinical Practice   Recommendations 2008, Diabetes Care,  2008, 31:(Suppl 1).     How often do you need to have someone help you  when you read instructions, pamphlets, or other written materials from your doctor or pharmacy?: 1 - Never  Interpreter Needed?: No  Information entered by :: NAllen LPN   Activities of Daily Living     04/26/2024    1:38 PM 03/16/2024    1:25 PM  In your present state of health, do you have any difficulty performing the following activities:  Hearing? 0   Vision? 0   Difficulty concentrating or making decisions? 1   Comment due to medication   Walking or climbing stairs? 1   Comment since surgery   Dressing or bathing? 0   Doing errands, shopping? 0 0  Preparing Food and eating ? N   Using the Toilet? N   In the past six months, have you accidently leaked urine? N   Do you have problems with loss of bowel control? N   Managing your Medications? N   Managing your Finances? N   Housekeeping or managing your Housekeeping? N     Patient Care Team: Joyce Norleen BROCKS, MD as PCP - General (Family Medicine) Shlomo Wilbert SAUNDERS, MD as PCP - Cardiology (Cardiology)  I have updated your Care Teams any recent Medical Services you may have received from other providers in the past year.     Assessment:   This is a routine wellness examination for Cristina Sawyer.  Hearing/Vision screen Hearing Screening - Comments:: Denies hearing issues Vision Screening - Comments:: Regular eye exams, Dr. Joshua   Goals Addressed             This Visit's Progress    Patient Stated       04/26/2024, heal up from surgery       Depression Screen     04/26/2024    1:46 PM 09/03/2023    1:36 PM 05/06/2023    1:34 PM 08/29/2022   10:10 AM 08/07/2021   10:09 AM 08/02/2020    9:28 AM 05/09/2020   11:35 AM  PHQ 2/9 Scores  PHQ - 2 Score 1 0 0 0 0 0 0  PHQ- 9 Score 5  2  Fall Risk     04/26/2024    1:44 PM 09/03/2023    1:36 PM 05/06/2023    1:33 PM 08/29/2022   10:10 AM 08/07/2021   10:09 AM  Fall Risk   Falls in the past year? 0 0 0 0 0  Number falls in past yr: 0 0 0 0 0  Injury with Fall?  0 0 0 0 0  Risk for fall due to : Impaired mobility  Medication side effect No Fall Risks No Fall Risks  Follow up Falls evaluation completed;Falls prevention discussed  Falls prevention discussed;Falls evaluation completed Falls evaluation completed  Falls evaluation completed      Data saved with a previous flowsheet row definition    MEDICARE RISK AT HOME:  Medicare Risk at Home Any stairs in or around the home?: Yes If so, are there any without handrails?: No Home free of loose throw rugs in walkways, pet beds, electrical cords, etc?: Yes Adequate lighting in your home to reduce risk of falls?: Yes Life alert?: No Use of a cane, walker or w/c?: Yes Grab bars in the bathroom?: No Shower chair or bench in shower?: Yes Elevated toilet seat or a handicapped toilet?: Yes  TIMED UP AND GO:  Was the test performed?  No  Cognitive Function: 6CIT completed        04/26/2024    1:47 PM 05/06/2023    1:36 PM  6CIT Screen  What Year? 0 points 0 points  What month? 0 points 0 points  What time? 0 points 0 points  Count back from 20 0 points 0 points  Months in reverse 0 points 0 points  Repeat phrase 2 points 0 points  Total Score 2 points 0 points    Immunizations Immunization History  Administered Date(s) Administered   Fluad Quad(high Dose 65+) 06/15/2019, 06/06/2020, 07/12/2021, 06/06/2022   Influenza, High Dose Seasonal PF 08/06/2013, 07/04/2015, 07/02/2016, 05/12/2017, 07/20/2018, 07/02/2023   Influenza-Unspecified 07/20/2018   PFIZER Comirnaty(Gray Top)Covid-19 Tri-Sucrose Vaccine 07/02/2023   PFIZER(Purple Top)SARS-COV-2 Vaccination 10/21/2019, 11/11/2019, 06/06/2020   Pfizer Covid-19 Vaccine Bivalent Booster 62yrs & up 08/07/2021   Pfizer(Comirnaty)Fall Seasonal Vaccine 12 years and older 06/28/2022   Pneumococcal Conjugate-13 01/17/2014   Pneumococcal Polysaccharide-23 07/13/2008, 01/09/2016   Tdap 07/13/2008   Zoster Recombinant(Shingrix) 12/06/2021, 03/26/2022     Screening Tests Health Maintenance  Topic Date Due   DTaP/Tdap/Td (2 - Td or Tdap) 07/13/2018   Colonoscopy  03/16/2019   COVID-19 Vaccine (7 - 2024-25 season) 08/27/2023   INFLUENZA VACCINE  04/16/2024   Medicare Annual Wellness (AWV)  04/26/2025   Pneumococcal Vaccine: 50+ Years  Completed   DEXA SCAN  Completed   Zoster Vaccines- Shingrix  Completed   Hepatitis B Vaccines  Aged Out   HPV VACCINES  Aged Out   Meningococcal B Vaccine  Aged Out   Hepatitis C Screening  Discontinued    Health Maintenance  Health Maintenance Due  Topic Date Due   DTaP/Tdap/Td (2 - Td or Tdap) 07/13/2018   Colonoscopy  03/16/2019   COVID-19 Vaccine (7 - 2024-25 season) 08/27/2023   INFLUENZA VACCINE  04/16/2024   Health Maintenance Items Addressed: Due for TDAP vaccine. States no longer does colonoscopies.  Additional Screening:  Vision Screening: Recommended annual ophthalmology exams for early detection of glaucoma and other disorders of the eye. Would you like a referral to an eye doctor? No    Dental Screening: Recommended annual dental exams for proper oral hygiene  Community Resource Referral /  Chronic Care Management: CRR required this visit?  No   CCM required this visit?  No   Plan:    I have personally reviewed and noted the following in the patient's chart:   Medical and social history Use of alcohol , tobacco or illicit drugs  Current medications and supplements including opioid prescriptions. Patient is not currently taking opioid prescriptions. Functional ability and status Nutritional status Physical activity Advanced directives List of other physicians Hospitalizations, surgeries, and ER visits in previous 12 months Vitals Screenings to include cognitive, depression, and falls Referrals and appointments  In addition, I have reviewed and discussed with patient certain preventive protocols, quality metrics, and best practice recommendations. A written  personalized care plan for preventive services as well as general preventive health recommendations were provided to patient.   Ardella FORBES Dawn, LPN   [id number]   After Visit Summary: (MyChart) Due to this being a telephonic visit, the after visit summary with patients personalized plan was offered to patient via MyChart   Notes: Nothing significant to report at this time.

## 2024-04-27 ENCOUNTER — Encounter: Payer: Self-pay | Admitting: Physical Therapy

## 2024-04-27 ENCOUNTER — Ambulatory Visit: Admitting: Physical Therapy

## 2024-04-27 DIAGNOSIS — M25562 Pain in left knee: Secondary | ICD-10-CM | POA: Diagnosis not present

## 2024-04-27 DIAGNOSIS — M25662 Stiffness of left knee, not elsewhere classified: Secondary | ICD-10-CM

## 2024-04-27 DIAGNOSIS — G8929 Other chronic pain: Secondary | ICD-10-CM

## 2024-04-27 DIAGNOSIS — R262 Difficulty in walking, not elsewhere classified: Secondary | ICD-10-CM | POA: Diagnosis not present

## 2024-04-27 DIAGNOSIS — R600 Localized edema: Secondary | ICD-10-CM | POA: Diagnosis not present

## 2024-04-27 NOTE — Therapy (Signed)
 OUTPATIENT PHYSICAL THERAPY TREATMENT   Patient Name: Cristina Sawyer MRN: [id number] DOB:04-04-1941, 83 y.o., female Today's Date: 04/27/2024  END OF SESSION:  PT End of Session - 04/27/24 1107     Visit Number 4    Number of Visits 17    Date for PT Re-Evaluation 07/06/24    Authorization Type [employer]    Progress Note Due on Visit 10    PT Start Time 1102    PT Stop Time 1155    PT Time Calculation (min) 53 min    Activity Tolerance Patient tolerated treatment well    Behavior During Therapy WFL for tasks assessed/performed             Past Medical History:  Diagnosis Date   Arthritis    Cataract    Chronic kidney disease    Diverticulosis    Dyslipidemia    GERD (gastroesophageal reflux disease)    HTN (hypertension) 2015   Personal history of colonic adenoma 08/09/2008   Vitamin D  deficiency    Past Surgical History:  Procedure Laterality Date   ABDOMINAL HYSTERECTOMY  1975   APPENDECTOMY  1975   COLONOSCOPY     HAND SURGERY     Cyst removal   THYROID  SURGERY  2000   to remove nodule   TOTAL KNEE ARTHROPLASTY Right 12/23/2022   Procedure: RIGHT TOTAL KNEE ARTHROPLASTY;  Surgeon: Jerri Kay HERO, MD;  Location: [facility] OR;  Service: Orthopedics;  Laterality: Right;   TOTAL KNEE ARTHROPLASTY Left 03/29/2024   Procedure: LEFT TOTAL KNEE ARTHROPLASTY;  Surgeon: Jerri Kay HERO, MD;  Location: [facility] OR;  Service: Orthopedics;  Laterality: Left;   Patient Active Problem List   Diagnosis Date Noted   Status post total left knee replacement 03/29/2024   Primary osteoarthritis of left knee 03/28/2024   Status post total right knee replacement 12/23/2022   History of GI bleed 06/06/2022   Osteopenia 08/30/2021   Aortic atherosclerosis (HCC) 07/12/2021   Diverticulosis 07/12/2021   GAD (generalized anxiety disorder) 07/05/2021   Personal history of calcium  pyrophosphate deposition disease (CPPD) 04/11/2020   History of vitamin D  deficiency 07/26/2019   Age-related  cataract of both eyes 05/19/2018   Hyperlipidemia 05/19/2018   CKD (chronic kidney disease) stage 3, GFR 30-59 ml/min (HCC) 02/14/2018   Essential hypertension 05/12/2017   Transient cerebral ischemia 05/12/2017   Seasonal allergic rhinitis due to pollen 12/29/2014   GERD (gastroesophageal reflux disease) 01/17/2014   History of colonic polyps 08/09/2008    PCP: Joyce Norleen BROCKS, MD   REFERRING PROVIDER: Jerri Kay HERO, MD  REFERRING DIAG: [phone] (ICD-10-CM) - Unilateral primary osteoarthritis, left knee  THERAPY DIAG:  Difficulty in walking, not elsewhere classified  Stiffness of left knee, not elsewhere classified  Localized edema  Chronic pain of left knee  Rationale for Evaluation and Treatment: Rehabilitation  ONSET DATE: 03/29/24 surgery Left TKA  SUBJECTIVE:   SUBJECTIVE STATEMENT:  She has not been feeling well with nausea, fatigue and body aches.  She did her exercises but not as much.   PERTINENT HISTORY: No previous Lt knee surgery. Left TKA 03/29/24, OA, osteopenia, right TKA 12/23/22  PAIN:   NPRS scale: 2/10 at rest, standing for ADLs 2-5/10,  walking 1-4/10,  exercising bending knee up to 8/10  Pain location: Lt knee ant/posterior Pain description: dull, achy Aggravating factors: ambulation, bending Relieving factors: medication, ice, elevation  PRECAUTIONS: None  RED FLAGS: None   WEIGHT BEARING RESTRICTIONS: No  FALLS:  Has  patient fallen in last 6 months? No  LIVING ENVIRONMENT: Lives with: lives with their family and lives alone Lives in: House/apartment Stairs: No Has following equipment at home: Single point cane and Environmental consultant - 2 wheeled  OCCUPATION: retired  PLOF: Independent  PATIENT GOALS: Decrease pain with all ADLs  NEXT MD VISIT: 05/11/24  OBJECTIVE:  Note: Objective measures were completed at Evaluation unless otherwise noted.  DIAGNOSTIC FINDINGS: Pre surgery -Imaging-X-rays demonstrate advanced tricompartmental degenerative  changes.  No  acute fracture noted.    PATIENT SURVEYS:  PSFS: THE PATIENT SPECIFIC FUNCTIONAL SCALE  Place score of 0-10 (0 = unable to perform activity and 10 = able to perform activity at the same level as before injury or problem)  Activity Date: 04/13/24    driving 6    2.sleeping 5    3.sit to stand 4    4.      Total Score 5      Total Score = Sum of activity scores/number of activities  Minimally Detectable Change: 3 points (for single activity); 2 points (for average score)   COGNITION: 04/13/2024 Overall cognitive status: Within functional limits for tasks assessed     SENSATION: 04/13/2024 [facility]  EDEMA:  04/13/2024 Not specifically recorded   MUSCLE LENGTH: 04/13/2024 Hamstrings: Right mild restriction ; Left moderate restriction  POSTURE:  04/13/2024 forward head  PALPATION: 04/13/2024 1+ tenderness at joint line and along incision  LOWER EXTREMITY ROM:   AROM/PROM Left Eval 04/13/2024 Left 04/19/24 Left 04/27/24  Hip flexion     Hip extension     Hip abduction     Hip adduction     Hip internal rotation     Hip external rotation     Knee flexion Seated 74/84 Supine  82/85 85 supine AROM heel slide 86* supine AAROM  Knee extension Supine +5/0    Ankle dorsiflexion     Ankle plantarflexion     Ankle inversion     Ankle eversion      (Blank rows = not tested)  LOWER EXTREMITY MMT:  MMT Left Eval 04/13/2024  Hip flexion 4-  Hip extension   Hip abduction 4-  Hip adduction   Hip internal rotation   Hip external rotation   Knee flexion 3+  Knee extension 3+  Ankle dorsiflexion 3+  Ankle plantarflexion   Ankle inversion   Ankle eversion    (Blank rows = not tested)  LOWER EXTREMITY SPECIAL TESTS:  04/13/2024 No special tests completed  FUNCTIONAL TESTS:  04/13/2024 5 times sit to stand: unable to complete  GAIT: 04/19/2024:   Ambulation into clinic c SPC in Rt UE.   04/13/2024 Distance walked: 50 feet Assistive  device utilized: Single point cane and Walker - 2 wheeled Level of assistance: SBA                    TREATMENT         DATE: 04/27/2024 Therex:   SciFit Bike seat 9 with BLEs & BUEs with rocking flexion stretch ROM, 8 minutes Seated heel slide with foot on wash cloth (reduced resistance) knee extension quad set and active flexion with overpressure by the opposite leg for end range flexion, 1x10 Supine heel slide with lower leg on ball and strap assistance with knee extension quad set 5 seconds and active assisted knee flexion 5-second hold 10 reps  Therapeutic Activities: Leg press BLEs 87# 10 reps 2 sets;   Tandem stance LLE in front 1st rep &  in back 2nd rep 30 sec;  1st set floor eyes open & 2nd set floor eyes closed with intermittent touch sink / chair back.   Manual  PROM with overpressure for knee flexion stretch seated  Vaso 10 mins Lt knee in elevation 34 degrees medium compression    TREATMENT         DATE: 04/22/2024 Therex:   NuStep lvl 5 for ROM, 8 minutes Seated knee extension/flexion with overpressure by the opposite leg, 1x10 LAQ w/ opposite knee flexion, 1x10 Supine 90/90 position allowing gravity to pull knee into flexion, 6x30 seconds Quad sets with heel prop, 2x10  Manual  Rocking Lt LE into flexion and extension   Vaso 10 mins Lt knee in elevation 34 degrees medium compression   TREATMENT         DATE: 04/19/2024 Therex:   Seated Lt knee AROM LAQ with end range pauses each direction x 10, 2 lbs x 15 Supine heel slide to quad set combo Lt leg 5 sec hold each way x 10  Supine Lt leg LAQ in 90 deg knee flexion x 10 c cues for home use Nustep Lvl 5 for ROM 8 mins  Self Care Edema reduction education with muscle pump description, elevation, icing.  Cues for techniques and positioning.  Discussed ankle pumps with icing as well.  Performed in part during vaso use.    Manual: Seated Lt knee flexion c distraction/IR mobilization with movement.   Vaso 10 mins  Lt knee in elevation 34 degrees medium compression                                                                                                                             TREATMENT         DATE: 04/13/24  Evaluation and review of HEP.  Pt to use ice at home instead of clinic due to feeling nauseous.   PATIENT EDUCATION:  Education details: HEP Person educated: Patient Education method: Solicitor, Actor cues, and Verbal cues Education comprehension: verbalized understanding, returned demonstration, and verbal cues required  HOME EXERCISE PROGRAM: Access Code: [email or web address] URL: [email or web address] Date: 04/13/2024 Prepared by: Burnard Meth  Exercises - Supine Quad Set  - 2 x daily - 7 x weekly - 2 sets - 10 reps - 3 hold - Supine Active Straight Leg Raise  - 2 x daily - 7 x weekly - 2 sets - 10 reps - Supine Heel Slides  - 2 x daily - 7 x weekly - 2 sets - 10 reps - 4 hold - Mini Squat  - 2 x daily - 7 x weekly - 2 sets - 10 reps - 2 hold - Standing Heel Raise with Support  - 2 x daily - 7 x weekly - 2 sets - 10 reps  ASSESSMENT:  CLINICAL IMPRESSION: Patient was able to improve knee range of motion with the exercises to reduce resistance.  Patient tolerated introduction of leg press with  bilateral lower extremities.  patient will continue to benefit from skilled PT to address impairments.   OBJECTIVE IMPAIRMENTS: Abnormal gait, decreased balance, decreased ROM, decreased strength, increased edema, impaired flexibility, and pain.   ACTIVITY LIMITATIONS: bending, sitting, standing, squatting, sleeping, stairs, transfers, bed mobility, and toileting  PARTICIPATION LIMITATIONS: meal prep, cleaning, driving, shopping, and community activity  PERSONAL FACTORS: Age and Fitness are also affecting patient's functional outcome.   REHAB POTENTIAL: Good  CLINICAL DECISION MAKING: Stable/uncomplicated  EVALUATION COMPLEXITY: Moderate   GOALS: Goals  reviewed with patient? Yes  SHORT TERM GOALS: Target date: 05/25/2024   Pt to be independent in HEP. Baseline: Goal status: on going 04/19/2024  2.  Decrease pain by 1-2 levels. Baseline:  Goal status: on going 04/19/2024  3.  Progress to ambulation with SPC  household distances. Baseline:  Goal status: on going 04/19/2024  LONG TERM GOALS: Target date: 07/06/2024    Increase AROM to 0> 110 to allow for improved mobility with sit to stand transfers and ADLs. Baseline: +5>82 Goal status: ongoing, 04/22/2024  2.  Increase LLE strength to at least 4/5 throughout.   Baseline: 3/5 Goal status: ongoing, 04/22/2024  3.  Able to ambulate short community distances with [facility] (if needed) independently. Baseline:  Goal status: ongoing, 04/22/2024  4.  Decrease pain to max 3/10 with rest and ADLs. Baseline:  Goal status: ongoing, 04/22/2024  5.  Increase by 2+ levels in PSFS score. Baseline:  Goal status: ongoing, 04/22/2024   PLAN:  PT FREQUENCY: 2x/week  PT DURATION: 12 weeks  PLANNED INTERVENTIONS: 97164- PT Re-evaluation, 97110-Therapeutic exercises, 97530- Therapeutic activity, 97112- Neuromuscular re-education, 97535- Self Care, 02859- Manual therapy, [phone]- Gait training, [phone]- Electrical stimulation (unattended), 97016- Vasopneumatic device, Patient/Family education, Balance training, Stair training, Joint mobilization, Scar mobilization, Cryotherapy, and Moist heat  PLAN FOR NEXT SESSION: Update HEP including some muscle stretches.    flexion/extension ROM gains, continue strengthening.     Grayce Spatz, PT, DPT 04/27/2024, 4:39 PM

## 2024-04-29 ENCOUNTER — Encounter: Payer: Self-pay | Admitting: Rehabilitative and Restorative Service Providers"

## 2024-04-29 ENCOUNTER — Ambulatory Visit: Admitting: Rehabilitative and Restorative Service Providers"

## 2024-04-29 ENCOUNTER — Other Ambulatory Visit: Payer: Medicare HMO

## 2024-04-29 ENCOUNTER — Telehealth: Payer: Self-pay | Admitting: *Deleted

## 2024-04-29 DIAGNOSIS — R600 Localized edema: Secondary | ICD-10-CM

## 2024-04-29 DIAGNOSIS — G8929 Other chronic pain: Secondary | ICD-10-CM

## 2024-04-29 DIAGNOSIS — M25662 Stiffness of left knee, not elsewhere classified: Secondary | ICD-10-CM

## 2024-04-29 DIAGNOSIS — R262 Difficulty in walking, not elsewhere classified: Secondary | ICD-10-CM | POA: Diagnosis not present

## 2024-04-29 DIAGNOSIS — M25562 Pain in left knee: Secondary | ICD-10-CM

## 2024-04-29 MED ORDER — HYDROCODONE-ACETAMINOPHEN 5-325 MG PO TABS: 1.0000 | ORAL_TABLET | Freq: Every day | ORAL | 0 refills | Status: DC | PRN

## 2024-04-29 NOTE — Telephone Encounter (Signed)
 I sent norco.  Thanks.

## 2024-04-29 NOTE — Therapy (Signed)
 OUTPATIENT PHYSICAL THERAPY TREATMENT   Patient Name: Cristina Sawyer MRN: [id number] DOB:[birth year], 83 y.o., female Today's Date: 04/29/2024  END OF SESSION:  PT End of Session - 04/29/24 1559     Visit Number 5    Number of Visits 17    Date for PT Re-Evaluation 07/06/24    Authorization Type [employer] Medicare    Progress Note Due on Visit 10    PT Start Time 1515    PT Stop Time 1607    PT Time Calculation (min) 52 min    Activity Tolerance Patient tolerated treatment well;No increased pain;Patient limited by pain    Behavior During Therapy [facility] for tasks assessed/performed              Past Medical History:  Diagnosis Date   Arthritis    Cataract    Chronic kidney disease    Diverticulosis    Dyslipidemia    GERD (gastroesophageal reflux disease)    HTN (hypertension) 2015   Personal history of colonic adenoma 08/09/2008   Vitamin D  deficiency    Past Surgical History:  Procedure Laterality Date   ABDOMINAL HYSTERECTOMY  1975   APPENDECTOMY  1975   COLONOSCOPY     HAND SURGERY     Cyst removal   THYROID  SURGERY  2000   to remove nodule   TOTAL KNEE ARTHROPLASTY Right 12/23/2022   Procedure: RIGHT TOTAL KNEE ARTHROPLASTY;  Surgeon: Jerri Kay HERO, MD;  Location: [facility] OR;  Service: Orthopedics;  Laterality: Right;   TOTAL KNEE ARTHROPLASTY Left 03/29/2024   Procedure: LEFT TOTAL KNEE ARTHROPLASTY;  Surgeon: Jerri Kay HERO, MD;  Location: [facility] OR;  Service: Orthopedics;  Laterality: Left;   Patient Active Problem List   Diagnosis Date Noted   Status post total left knee replacement 03/29/2024   Primary osteoarthritis of left knee 03/28/2024   Status post total right knee replacement 12/23/2022   History of GI bleed 06/06/2022   Osteopenia 08/30/2021   Aortic atherosclerosis (HCC) 07/12/2021   Diverticulosis 07/12/2021   GAD (generalized anxiety disorder) 07/05/2021   Personal history of calcium  pyrophosphate deposition disease (CPPD) 04/11/2020   History of  vitamin D  deficiency 07/26/2019   Age-related cataract of both eyes 05/19/2018   Hyperlipidemia 05/19/2018   CKD (chronic kidney disease) stage 3, GFR 30-59 ml/min (HCC) 02/14/2018   Essential hypertension 05/12/2017   Transient cerebral ischemia 05/12/2017   Seasonal allergic rhinitis due to pollen 12/29/2014   GERD (gastroesophageal reflux disease) 01/17/2014   History of colonic polyps 08/09/2008    PCP: Joyce Norleen BROCKS, MD   REFERRING PROVIDER: Jerri Kay HERO, MD  REFERRING DIAG: [phone] (ICD-10-CM) - Unilateral primary osteoarthritis, left knee  THERAPY DIAG:  Difficulty in walking, not elsewhere classified  Stiffness of left knee, not elsewhere classified  Localized edema  Chronic pain of left knee  Rationale for Evaluation and Treatment: Rehabilitation  ONSET DATE: 03/29/24 surgery Left TKA  SUBJECTIVE:   SUBJECTIVE STATEMENT:  Cristina Sawyer reports limited sleep (2-3 hours uninterrupted) but good HEP participation.  PERTINENT HISTORY: No previous Lt knee surgery. Left TKA 03/29/24, OA, osteopenia, right TKA 12/23/22  PAIN:   NPRS scale: 2/10 at rest, standing for ADLs 2-5/10,  walking 1-4/10,  exercising bending knee up to 8/10  Pain location: Lt knee ant/posterior Pain description: dull, achy Aggravating factors: ambulation, bending Relieving factors: medication, ice, elevation  PRECAUTIONS: None  RED FLAGS: None   WEIGHT BEARING RESTRICTIONS: No  FALLS:  Has patient fallen in last 6  months? No  LIVING ENVIRONMENT: Lives with: lives with their family and lives alone Lives in: House/apartment Stairs: No Has following equipment at home: Single point cane and Environmental consultant - 2 wheeled  OCCUPATION: retired  PLOF: Independent  PATIENT GOALS: Decrease pain with all ADLs  NEXT MD VISIT: 05/11/24  OBJECTIVE:  Note: Objective measures were completed at Evaluation unless otherwise noted.  DIAGNOSTIC FINDINGS: Pre surgery -Imaging-X-rays demonstrate advanced  tricompartmental degenerative changes.  No  acute fracture noted.    PATIENT SURVEYS:  PSFS: THE PATIENT SPECIFIC FUNCTIONAL SCALE  Place score of 0-10 (0 = unable to perform activity and 10 = able to perform activity at the same level as before injury or problem)  Activity Date: 04/13/24    driving 6    2.sleeping 5    3.sit to stand 4    4.      Total Score 5      Total Score = Sum of activity scores/number of activities  Minimally Detectable Change: 3 points (for single activity); 2 points (for average score)   COGNITION: 04/13/2024 Overall cognitive status: Within functional limits for tasks assessed     SENSATION: 04/13/2024 [facility]  EDEMA:  04/13/2024 Not specifically recorded   MUSCLE LENGTH: 04/13/2024 Hamstrings: Right mild restriction ; Left moderate restriction  POSTURE:  04/13/2024 forward head  PALPATION: 04/13/2024 1+ tenderness at joint line and along incision  LOWER EXTREMITY ROM:   AROM/PROM Left Eval 04/13/2024 Left 04/19/24 Left 04/27/24 Left 04/29/2024  Hip flexion      Hip extension      Hip abduction      Hip adduction      Hip internal rotation      Hip external rotation      Knee flexion Seated 74/84 Supine  82/85 85 supine AROM heel slide 86* supine AAROM 91* with strap  Knee extension Supine +5/0   Lacks 3*  Ankle dorsiflexion      Ankle plantarflexion      Ankle inversion      Ankle eversion       (Blank rows = not tested)  LOWER EXTREMITY MMT:  MMT Left Eval 04/13/2024  Hip flexion 4-  Hip extension   Hip abduction 4-  Hip adduction   Hip internal rotation   Hip external rotation   Knee flexion 3+  Knee extension 3+  Ankle dorsiflexion 3+  Ankle plantarflexion   Ankle inversion   Ankle eversion    (Blank rows = not tested)  LOWER EXTREMITY SPECIAL TESTS:  04/13/2024 No special tests completed  FUNCTIONAL TESTS:  04/13/2024 5 times sit to stand: unable to complete  GAIT: 04/19/2024:   Ambulation into  clinic c SPC in Rt UE.   04/13/2024 Distance walked: 50 feet Assistive device utilized: Single point cane and Walker - 2 wheeled Level of assistance: SBA                    TREATMENT         DATE: 04/29/2024 Sci Fit Bike Seat 8 AAROM to full range 5 minutes Recumbent bike Seat 5 for 5 minutes AAROM to full range (recommend next visit) Quad sets with heel prop 2 sets of 10 for 5 seconds Supine knee flexion with strap 10 x 5 seconds T[place]gate knee flexion 1 minute  Functional Activities (for sit to stand/stairs): Double Leg Press 75# 15 x slow eccentrics and stretch into flexion Single Leg Press 25# 10 x slow eccentrics and stretch into flexion  Vaso Left Knee 10 minutes Medium 34*   TREATMENT         DATE: 04/27/2024 Therex:   SciFit Bike seat 9 with BLEs & BUEs with rocking flexion stretch ROM, 8 minutes Seated heel slide with foot on wash cloth (reduced resistance) knee extension quad set and active flexion with overpressure by the opposite leg for end range flexion, 1x10 Supine heel slide with lower leg on ball and strap assistance with knee extension quad set 5 seconds and active assisted knee flexion 5-second hold 10 reps  Therapeutic Activities: Leg press BLEs 87# 10 reps 2 sets;   Tandem stance LLE in front 1st rep & in back 2nd rep 30 sec;  1st set floor eyes open & 2nd set floor eyes closed with intermittent touch sink / chair back.   Manual  PROM with overpressure for knee flexion stretch seated  Vaso 10 mins Lt knee in elevation 34 degrees medium compression   TREATMENT         DATE: 04/22/2024 Therex:   NuStep lvl 5 for ROM, 8 minutes Seated knee extension/flexion with overpressure by the opposite leg, 1x10 LAQ w/ opposite knee flexion, 1x10 Supine 90/90 position allowing gravity to pull knee into flexion, 6x30 seconds Quad sets with heel prop, 2x10  Manual  Rocking Lt LE into flexion and extension   Vaso 10 mins Lt knee in elevation 34 degrees medium  compression    PATIENT EDUCATION:  Education details: HEP Person educated: Patient Education method: Programmer, multimedia, Demonstration, Actor cues, and Verbal cues Education comprehension: verbalized understanding, returned demonstration, and verbal cues required  HOME EXERCISE PROGRAM: Access Code: [email or web address] URL: [email or web address] Date: 04/13/2024 Prepared by: Burnard Meth  Exercises - Supine Quad Set  - 2 x daily - 7 x weekly - 2 sets - 10 reps - 3 hold - Supine Active Straight Leg Raise  - 2 x daily - 7 x weekly - 2 sets - 10 reps - Supine Heel Slides  - 2 x daily - 7 x weekly - 2 sets - 10 reps - 4 hold - Mini Squat  - 2 x daily - 7 x weekly - 2 sets - 10 reps - 2 hold - Standing Heel Raise with Support  - 2 x daily - 7 x weekly - 2 sets - 10 reps  ASSESSMENT:  CLINICAL IMPRESSION: Cristina Sawyer had AROM at 0 - 3 - 91 degrees today with strap assistance for flexion.  I reinforced the emphasis on edema control, flexion active range of motion and quadriceps activation for her home exercise program.  Cristina Sawyer is on track to meet long-term goals.  OBJECTIVE IMPAIRMENTS: Abnormal gait, decreased balance, decreased ROM, decreased strength, increased edema, impaired flexibility, and pain.   ACTIVITY LIMITATIONS: bending, sitting, standing, squatting, sleeping, stairs, transfers, bed mobility, and toileting  PARTICIPATION LIMITATIONS: meal prep, cleaning, driving, shopping, and community activity  PERSONAL FACTORS: Age and Fitness are also affecting patient's functional outcome.   REHAB POTENTIAL: Good  CLINICAL DECISION MAKING: Stable/uncomplicated  EVALUATION COMPLEXITY: Moderate   GOALS: Goals reviewed with patient? Yes  SHORT TERM GOALS: Target date: 05/25/2024   Pt to be independent in HEP. Baseline: Goal status: on going 04/29/2024  2.  Decrease pain by 1-2 levels. Baseline:  Goal status: on going 04/29/2024  3.  Progress to ambulation with SPC  household  distances. Baseline:  Goal status: on going 04/29/2024  LONG TERM GOALS: Target date: 07/06/2024    Increase AROM to 0> 110 to allow for  improved mobility with sit to stand transfers and ADLs. Baseline: +5>82 Goal status: ongoing, 04/29/2024  2.  Increase LLE strength to at least 4/5 throughout.   Baseline: 3/5 Goal status: ongoing, 04/22/2024  3.  Able to ambulate short community distances with [facility] (if needed) independently. Baseline:  Goal status: ongoing, 04/29/2024  4.  Decrease pain to max 3/10 with rest and ADLs. Baseline:  Goal status: ongoing, 04/29/2024  5.  Increase by 2+ levels in PSFS score. Baseline:  Goal status: ongoing, 04/22/2024   PLAN:  PT FREQUENCY: 2x/week  PT DURATION: 12 weeks  PLANNED INTERVENTIONS: 97164- PT Re-evaluation, 97110-Therapeutic exercises, 97530- Therapeutic activity, 97112- Neuromuscular re-education, 97535- Self Care, 02859- Manual therapy, [phone]- Gait training, [phone]- Electrical stimulation (unattended), 97016- Vasopneumatic device, Patient/Family education, Balance training, Stair training, Joint mobilization, Scar mobilization, Cryotherapy, and Moist heat  PLAN FOR NEXT SESSION: Update HEP including some muscle stretches as needed.  Flexion > extension ROM gains, continue strengthening with balance and functional progressions as active range of motion, particularly flexion improve.     Myer LELON Ivory, PT, MPT 04/29/2024, 5:07 PM

## 2024-04-29 NOTE — Telephone Encounter (Signed)
Patient requesting refill of pain medication. Thank you.

## 2024-05-03 ENCOUNTER — Encounter: Payer: Self-pay | Admitting: Rehabilitative and Restorative Service Providers"

## 2024-05-03 ENCOUNTER — Ambulatory Visit: Admitting: Rehabilitative and Restorative Service Providers"

## 2024-05-03 DIAGNOSIS — M25662 Stiffness of left knee, not elsewhere classified: Secondary | ICD-10-CM

## 2024-05-03 DIAGNOSIS — R600 Localized edema: Secondary | ICD-10-CM | POA: Diagnosis not present

## 2024-05-03 DIAGNOSIS — R262 Difficulty in walking, not elsewhere classified: Secondary | ICD-10-CM

## 2024-05-03 DIAGNOSIS — M25562 Pain in left knee: Secondary | ICD-10-CM | POA: Diagnosis not present

## 2024-05-03 DIAGNOSIS — G8929 Other chronic pain: Secondary | ICD-10-CM

## 2024-05-03 NOTE — Therapy (Signed)
 OUTPATIENT PHYSICAL THERAPY TREATMENT   Patient Name: Cristina Sawyer MRN: [id number] DOB:April 08, 1941, 83 y.o., female Today's Date: 05/03/2024  END OF SESSION:  PT End of Session - 05/03/24 1523     Visit Number 6    Number of Visits 17    Date for PT Re-Evaluation 07/06/24    Authorization Type [employer]    Progress Note Due on Visit 10    PT Start Time 1517    PT Stop Time 1555    PT Time Calculation (min) 38 min    Activity Tolerance Patient tolerated treatment well    Behavior During Therapy WFL for tasks assessed/performed               Past Medical History:  Diagnosis Date   Arthritis    Cataract    Chronic kidney disease    Diverticulosis    Dyslipidemia    GERD (gastroesophageal reflux disease)    HTN (hypertension) 2015   Personal history of colonic adenoma 08/09/2008   Vitamin D  deficiency    Past Surgical History:  Procedure Laterality Date   ABDOMINAL HYSTERECTOMY  1975   APPENDECTOMY  1975   COLONOSCOPY     HAND SURGERY     Cyst removal   THYROID  SURGERY  2000   to remove nodule   TOTAL KNEE ARTHROPLASTY Right 12/23/2022   Procedure: RIGHT TOTAL KNEE ARTHROPLASTY;  Surgeon: Jerri Kay HERO, MD;  Location: [facility] OR;  Service: Orthopedics;  Laterality: Right;   TOTAL KNEE ARTHROPLASTY Left 03/29/2024   Procedure: LEFT TOTAL KNEE ARTHROPLASTY;  Surgeon: Jerri Kay HERO, MD;  Location: [facility] OR;  Service: Orthopedics;  Laterality: Left;   Patient Active Problem List   Diagnosis Date Noted   Status post total left knee replacement 03/29/2024   Primary osteoarthritis of left knee 03/28/2024   Status post total right knee replacement 12/23/2022   History of GI bleed 06/06/2022   Osteopenia 08/30/2021   Aortic atherosclerosis (HCC) 07/12/2021   Diverticulosis 07/12/2021   GAD (generalized anxiety disorder) 07/05/2021   Personal history of calcium  pyrophosphate deposition disease (CPPD) 04/11/2020   History of vitamin D  deficiency 07/26/2019    Age-related cataract of both eyes 05/19/2018   Hyperlipidemia 05/19/2018   CKD (chronic kidney disease) stage 3, GFR 30-59 ml/min (HCC) 02/14/2018   Essential hypertension 05/12/2017   Transient cerebral ischemia 05/12/2017   Seasonal allergic rhinitis due to pollen 12/29/2014   GERD (gastroesophageal reflux disease) 01/17/2014   History of colonic polyps 08/09/2008    PCP: Joyce Norleen BROCKS, MD   REFERRING PROVIDER: Jerri Kay HERO, MD  REFERRING DIAG: [phone] (ICD-10-CM) - Unilateral primary osteoarthritis, left knee  THERAPY DIAG:  Difficulty in walking, not elsewhere classified  Stiffness of left knee, not elsewhere classified  Localized edema  Chronic pain of left knee  Rationale for Evaluation and Treatment: Rehabilitation  ONSET DATE: 03/29/24 surgery Left TKA  SUBJECTIVE:   SUBJECTIVE STATEMENT:   Pt indicated she might have taken one too many pain pills today.  Pt indicated not feeling as strong and has a little appetitive.   PERTINENT HISTORY: No previous Lt knee surgery. Left TKA 03/29/24, OA, osteopenia, right TKA 12/23/22  PAIN:   NPRS scale: upon arrival today: 2/10 Pain location: Lt knee ant/posterior Pain description: dull, achy Aggravating factors: ambulation, bending Relieving factors: medication, ice, elevation  PRECAUTIONS: None  RED FLAGS: None   WEIGHT BEARING RESTRICTIONS: No  FALLS:  Has patient fallen in last 6 months? No  LIVING ENVIRONMENT: Lives with: lives with their family and lives alone Lives in: House/apartment Stairs: No Has following equipment at home: Single point cane and Environmental consultant - 2 wheeled  OCCUPATION: retired  PLOF: Independent  PATIENT GOALS: Decrease pain with all ADLs  NEXT MD VISIT: 05/11/24  OBJECTIVE:  Note: Objective measures were completed at Evaluation unless otherwise noted.  DIAGNOSTIC FINDINGS: Pre surgery -Imaging-X-rays demonstrate advanced tricompartmental degenerative changes.  No  acute fracture  noted.    PATIENT SURVEYS:  PSFS: THE PATIENT SPECIFIC FUNCTIONAL SCALE  Place score of 0-10 (0 = unable to perform activity and 10 = able to perform activity at the same level as before injury or problem)  Activity Date: 04/13/24    driving 6    2.sleeping 5    3.sit to stand 4    4.      Total Score 5      Total Score = Sum of activity scores/number of activities  Minimally Detectable Change: 3 points (for single activity); 2 points (for average score)   COGNITION: 04/13/2024 Overall cognitive status: Within functional limits for tasks assessed     SENSATION: 04/13/2024 [facility]  EDEMA:  04/13/2024 Not specifically recorded   MUSCLE LENGTH: 04/13/2024 Hamstrings: Right mild restriction ; Left moderate restriction  POSTURE:  04/13/2024 forward head  PALPATION: 04/13/2024 1+ tenderness at joint line and along incision  LOWER EXTREMITY ROM:   AROM/PROM Left Eval 04/13/2024 Left 04/19/24 Left 04/27/24 Left 04/29/2024  Hip flexion      Hip extension      Hip abduction      Hip adduction      Hip internal rotation      Hip external rotation      Knee flexion Seated 74/84 Supine  82/85 85 supine AROM heel slide 86* supine AAROM 91* with strap  Knee extension Supine +5/0   Lacks 3*  Ankle dorsiflexion      Ankle plantarflexion      Ankle inversion      Ankle eversion       (Blank rows = not tested)  LOWER EXTREMITY MMT:  MMT Left Eval 04/13/2024  Hip flexion 4-  Hip extension   Hip abduction 4-  Hip adduction   Hip internal rotation   Hip external rotation   Knee flexion 3+  Knee extension 3+  Ankle dorsiflexion 3+  Ankle plantarflexion   Ankle inversion   Ankle eversion    (Blank rows = not tested)  LOWER EXTREMITY SPECIAL TESTS:  04/13/2024 No special tests completed  FUNCTIONAL TESTS:  04/13/2024 5 times sit to stand: unable to complete  GAIT: 04/19/2024:   Ambulation into clinic c SPC in Rt UE.   04/13/2024 Distance walked: 50  feet Assistive device utilized: Single point cane and Walker - 2 wheeled Level of assistance: SBA                     TREATMENT         DATE: 05/03/2024 Therex: Recumbent bike seat 6 with partial circles stretching for ROM 7 mins.  Incline gastroc 30 sec x 3 bialteral Seated Lt leg LAQ with end range pauses 2.5 lbs 2 x 15 Seated Lt knee flexion green band x 15  TherActivity (to improve standing, walking, transfers, stairs) Leg press double leg 75 lbs x 15 in available range Leg press single leg 25 lbs Lt leg 2 x 15 in available range Sit to stand to sit 20.5 inch table x  10 no UE assist - cues to avoid shifting weight to Rt leg.    Manual Lt knee flexion c distraction/IR mobilization c movement    TREATMENT         DATE: 04/29/2024 Sci Fit Bike Seat 8 AAROM to full range 5 minutes Recumbent bike Seat 5 for 5 minutes AAROM to full range (recommend next visit) Quad sets with heel prop 2 sets of 10 for 5 seconds Supine knee flexion with strap 10 x 5 seconds Tailgate knee flexion 1 minute  Functional Activities (for sit to stand/stairs): Double Leg Press 75# 15 x slow eccentrics and stretch into flexion Single Leg Press 25# 10 x slow eccentrics and stretch into flexion  Vaso Left Knee 10 minutes Medium 34*   TREATMENT         DATE: 04/27/2024 Therex:   SciFit Bike seat 9 with BLEs & BUEs with rocking flexion stretch ROM, 8 minutes Seated heel slide with foot on wash cloth (reduced resistance) knee extension quad set and active flexion with overpressure by the opposite leg for end range flexion, 1x10 Supine heel slide with lower leg on ball and strap assistance with knee extension quad set 5 seconds and active assisted knee flexion 5-second hold 10 reps  Therapeutic Activities: Leg press BLEs 87# 10 reps 2 sets;   Tandem stance LLE in front 1st rep & in back 2nd rep 30 sec;  1st set floor eyes open & 2nd set floor eyes closed with intermittent touch sink / chair back.   Manual   PROM with overpressure for knee flexion stretch seated  Vaso 10 mins Lt knee in elevation 34 degrees medium compression   TREATMENT         DATE: 04/22/2024 Therex:   NuStep lvl 5 for ROM, 8 minutes Seated knee extension/flexion with overpressure by the opposite leg, 1x10 LAQ w/ opposite knee flexion, 1x10 Supine 90/90 position allowing gravity to pull knee into flexion, 6x30 seconds Quad sets with heel prop, 2x10  Manual  Rocking Lt LE into flexion and extension   Vaso 10 mins Lt knee in elevation 34 degrees medium compression    PATIENT EDUCATION:  Education details: HEP Person educated: Patient Education method: Programmer, multimedia, Demonstration, Actor cues, and Verbal cues Education comprehension: verbalized understanding, returned demonstration, and verbal cues required  HOME EXERCISE PROGRAM: Access Code: [email or web address] URL: [email or web address] Date: 04/13/2024 Prepared by: Burnard Meth  Exercises - Supine Quad Set  - 2 x daily - 7 x weekly - 2 sets - 10 reps - 3 hold - Supine Active Straight Leg Raise  - 2 x daily - 7 x weekly - 2 sets - 10 reps - Supine Heel Slides  - 2 x daily - 7 x weekly - 2 sets - 10 reps - 4 hold - Mini Squat  - 2 x daily - 7 x weekly - 2 sets - 10 reps - 2 hold - Standing Heel Raise with Support  - 2 x daily - 7 x weekly - 2 sets - 10 reps  ASSESSMENT:  CLINICAL IMPRESSION:  Pt to continue to benefit from skilled PT services c focus on mobility gains, strength and early balance challenges to improve stability in ambulation with and without SPC.  Sit to stand from 20.5 inch table height without UE noted today.   OBJECTIVE IMPAIRMENTS: Abnormal gait, decreased balance, decreased ROM, decreased strength, increased edema, impaired flexibility, and pain.   ACTIVITY LIMITATIONS: bending, sitting, standing, squatting, sleeping, stairs, transfers, bed mobility, and  toileting  PARTICIPATION LIMITATIONS: meal prep, cleaning, driving,  shopping, and community activity  PERSONAL FACTORS: Age and Fitness are also affecting patient's functional outcome.   REHAB POTENTIAL: Good  CLINICAL DECISION MAKING: Stable/uncomplicated  EVALUATION COMPLEXITY: Moderate   GOALS: Goals reviewed with patient? Yes  SHORT TERM GOALS: Target date: 05/25/2024   Pt to be independent in HEP. Baseline: Goal status: on going 04/29/2024  2.  Decrease pain by 1-2 levels. Baseline:  Goal status: on going 04/29/2024  3.  Progress to ambulation with SPC  household distances. Baseline:  Goal status: on going 04/29/2024  LONG TERM GOALS: Target date: 07/06/2024    Increase AROM to 0> 110 to allow for improved mobility with sit to stand transfers and ADLs.  Goal status: ongoing, 04/29/2024  2.  Increase LLE strength to at least 4/5 throughout.    Goal status: ongoing, 04/22/2024  3.  Able to ambulate short community distances with [facility] (if needed) independently.  Goal status: ongoing, 04/29/2024  4.  Decrease pain to max 3/10 with rest and ADLs.  Goal status: ongoing, 04/29/2024  5.  Increase by 2+ levels in PSFS score.  Goal status: ongoing, 04/22/2024   PLAN:  PT FREQUENCY: 2x/week  PT DURATION: 12 weeks  PLANNED INTERVENTIONS: 97164- PT Re-evaluation, 97110-Therapeutic exercises, 97530- Therapeutic activity, 97112- Neuromuscular re-education, 97535- Self Care, 02859- Manual therapy, [phone]- Gait training, [phone]- Electrical stimulation (unattended), 97016- Vasopneumatic device, Patient/Family education, Balance training, Stair training, Joint mobilization, Scar mobilization, Cryotherapy, and Moist heat  PLAN FOR NEXT SESSION:  Balance challenges, strengthening, mobility gains.   MD visit May 12, 2024   Ozell Silvan, PT, DPT, OCS, ATC 05/03/24  3:56 PM

## 2024-05-05 ENCOUNTER — Ambulatory Visit

## 2024-05-05 DIAGNOSIS — M25562 Pain in left knee: Secondary | ICD-10-CM

## 2024-05-05 DIAGNOSIS — M25662 Stiffness of left knee, not elsewhere classified: Secondary | ICD-10-CM

## 2024-05-05 DIAGNOSIS — R600 Localized edema: Secondary | ICD-10-CM | POA: Diagnosis not present

## 2024-05-05 DIAGNOSIS — G8929 Other chronic pain: Secondary | ICD-10-CM | POA: Diagnosis not present

## 2024-05-05 DIAGNOSIS — R262 Difficulty in walking, not elsewhere classified: Secondary | ICD-10-CM

## 2024-05-05 NOTE — Therapy (Signed)
 OUTPATIENT PHYSICAL THERAPY TREATMENT   Patient Name: Cristina Sawyer MRN: [id number] DOB:1941/08/04, 83 y.o., female Today's Date: 05/05/2024  END OF SESSION:  PT End of Session - 05/05/24 1208     Visit Number 7    Number of Visits 17    Date for PT Re-Evaluation 07/06/24    Authorization Type [employer]    Progress Note Due on Visit 10    PT Start Time 1147    PT Stop Time 1226    PT Time Calculation (min) 39 min    Activity Tolerance Patient tolerated treatment well    Behavior During Therapy WFL for tasks assessed/performed                Past Medical History:  Diagnosis Date   Arthritis    Cataract    Chronic kidney disease    Diverticulosis    Dyslipidemia    GERD (gastroesophageal reflux disease)    HTN (hypertension) 2015   Personal history of colonic adenoma 08/09/2008   Vitamin D  deficiency    Past Surgical History:  Procedure Laterality Date   ABDOMINAL HYSTERECTOMY  1975   APPENDECTOMY  1975   COLONOSCOPY     HAND SURGERY     Cyst removal   THYROID  SURGERY  2000   to remove nodule   TOTAL KNEE ARTHROPLASTY Right 12/23/2022   Procedure: RIGHT TOTAL KNEE ARTHROPLASTY;  Surgeon: Jerri Kay HERO, MD;  Location: [facility] OR;  Service: Orthopedics;  Laterality: Right;   TOTAL KNEE ARTHROPLASTY Left 03/29/2024   Procedure: LEFT TOTAL KNEE ARTHROPLASTY;  Surgeon: Jerri Kay HERO, MD;  Location: [facility] OR;  Service: Orthopedics;  Laterality: Left;   Patient Active Problem List   Diagnosis Date Noted   Status post total left knee replacement 03/29/2024   Primary osteoarthritis of left knee 03/28/2024   Status post total right knee replacement 12/23/2022   History of GI bleed 06/06/2022   Osteopenia 08/30/2021   Aortic atherosclerosis (HCC) 07/12/2021   Diverticulosis 07/12/2021   GAD (generalized anxiety disorder) 07/05/2021   Personal history of calcium  pyrophosphate deposition disease (CPPD) 04/11/2020   History of vitamin D  deficiency 07/26/2019    Age-related cataract of both eyes 05/19/2018   Hyperlipidemia 05/19/2018   CKD (chronic kidney disease) stage 3, GFR 30-59 ml/min (HCC) 02/14/2018   Essential hypertension 05/12/2017   Transient cerebral ischemia 05/12/2017   Seasonal allergic rhinitis due to pollen 12/29/2014   GERD (gastroesophageal reflux disease) 01/17/2014   History of colonic polyps 08/09/2008    PCP: Joyce Norleen BROCKS, MD   REFERRING PROVIDER: Jerri Kay HERO, MD  REFERRING DIAG: [phone] (ICD-10-CM) - Unilateral primary osteoarthritis, left knee  THERAPY DIAG:  Difficulty in walking, not elsewhere classified  Stiffness of left knee, not elsewhere classified  Localized edema  Chronic pain of left knee  Rationale for Evaluation and Treatment: Rehabilitation  ONSET DATE: 03/29/24 surgery Left TKA  SUBJECTIVE:   SUBJECTIVE STATEMENT:   Pt states pain meds have been less, but it has been affecting her sleep. Doesn't need the cane around the house.   PERTINENT HISTORY: No previous Lt knee surgery. Left TKA 03/29/24, OA, osteopenia, right TKA 12/23/22  PAIN:   NPRS scale: upon arrival today: 3/10 and last night 6/10 Pain location: Lt knee ant/posteri Pain description: dull, achy Aggravating factors: ambulation, bending Relieving factors: medication, ice, elevation  PRECAUTIONS: None  RED FLAGS: None   WEIGHT BEARING RESTRICTIONS: No  FALLS:  Has patient fallen in last 6 months?  No  LIVING ENVIRONMENT: Lives with: lives with their family and lives alone Lives in: House/apartment Stairs: No Has following equipment at home: Single point cane and Environmental consultant - 2 wheeled  OCCUPATION: retired  PLOF: Independent  PATIENT GOALS: Decrease pain with all ADLs  NEXT MD VISIT: 05/11/24  OBJECTIVE:  Note: Objective measures were completed at Evaluation unless otherwise noted.  DIAGNOSTIC FINDINGS: Pre surgery -Imaging-X-rays demonstrate advanced tricompartmental degenerative changes.  No  acute fracture  noted.    PATIENT SURVEYS:  PSFS: THE PATIENT SPECIFIC FUNCTIONAL SCALE  Place score of 0-10 (0 = unable to perform activity and 10 = able to perform activity at the same level as before injury or problem)  Activity Date: 04/13/24    driving 6    2.sleeping 5    3.sit to stand 4    4.      Total Score 5      Total Score = Sum of activity scores/number of activities  Minimally Detectable Change: 3 points (for single activity); 2 points (for average score)   COGNITION: 04/13/2024 Overall cognitive status: Within functional limits for tasks assessed     SENSATION: 04/13/2024 [facility]  EDEMA:  04/13/2024 Not specifically recorded   MUSCLE LENGTH: 04/13/2024 Hamstrings: Right mild restriction ; Left moderate restriction  POSTURE:  04/13/2024 forward head  PALPATION: 04/13/2024 1+ tenderness at joint line and along incision  LOWER EXTREMITY ROM:   AROM/PROM Left Eval 04/13/2024 Left 04/19/24 Left 04/27/24 Left 04/29/2024  Hip flexion      Hip extension      Hip abduction      Hip adduction      Hip internal rotation      Hip external rotation      Knee flexion Seated 74/84 Supine  82/85 85 supine AROM heel slide 86* supine AAROM 91* with strap  Knee extension Supine +5/0   Lacks 3*  Ankle dorsiflexion      Ankle plantarflexion      Ankle inversion      Ankle eversion       (Blank rows = not tested)  LOWER EXTREMITY MMT:  MMT Left Eval 04/13/2024  Hip flexion 4-  Hip extension   Hip abduction 4-  Hip adduction   Hip internal rotation   Hip external rotation   Knee flexion 3+  Knee extension 3+  Ankle dorsiflexion 3+  Ankle plantarflexion   Ankle inversion   Ankle eversion    (Blank rows = not tested)  LOWER EXTREMITY SPECIAL TESTS:  04/13/2024 No special tests completed  FUNCTIONAL TESTS:  04/13/2024 5 times sit to stand: unable to complete  GAIT: 04/19/2024:   Ambulation into clinic c SPC in Rt UE.   04/13/2024 Distance walked: 50  feet Assistive device utilized: Single point cane and Walker - 2 wheeled Level of assistance: SBA                     TREATMENT         DATE:  05/05/24 Therex: Nustep level 4 8 min Incline gastroc 30 sec x 3 bialteral Supine SAQ  10x no weight 10x 2# Seated Lt knee flexion with slider    TherActivity (to improve standing, walking, transfers, stairs) Leg press double leg 75 lbs 2x 15 in available range Leg press single leg 25 lbs Lt leg 2 x 15 in available range Sit to stand to sit 20.5 inch table 2x 8 no UE assist - cues to avoid shifting weight  to Rt leg.    Manual Lt knee PROM  05/03/2024 Therex: Recumbent bike seat 6 with partial circles stretching for ROM 7 mins.  Incline gastroc 30 sec x 3 bialteral Seated Lt leg LAQ with end range pauses 2.5 lbs 2 x 15 Seated Lt knee flexion green band x 15  TherActivity (to improve standing, walking, transfers, stairs) Leg press double leg 75 lbs x 15 in available range Leg press single leg 25 lbs Lt leg 2 x 15 in available range Sit to stand to sit 20.5 inch table x 10 no UE assist - cues to avoid shifting weight to Rt leg.    Manual Lt knee flexion c distraction/IR mobilization c movement    TREATMENT         DATE: 04/29/2024 Sci Fit Bike Seat 8 AAROM to full range 5 minutes Recumbent bike Seat 5 for 5 minutes AAROM to full range (recommend next visit) Quad sets with heel prop 2 sets of 10 for 5 seconds Supine knee flexion with strap 10 x 5 seconds Tailgate knee flexion 1 minute  Functional Activities (for sit to stand/stairs): Double Leg Press 75# 15 x slow eccentrics and stretch into flexion Single Leg Press 25# 10 x slow eccentrics and stretch into flexion  Vaso Left Knee 10 minutes Medium 34*   TREATMENT         DATE: 04/27/2024 Therex:   SciFit Bike seat 9 with BLEs & BUEs with rocking flexion stretch ROM, 8 minutes Seated heel slide with foot on wash cloth (reduced resistance) knee extension quad set and active  flexion with overpressure by the opposite leg for end range flexion, 1x10 Supine heel slide with lower leg on ball and strap assistance with knee extension quad set 5 seconds and active assisted knee flexion 5-second hold 10 reps  Therapeutic Activities: Leg press BLEs 87# 10 reps 2 sets;   Tandem stance LLE in front 1st rep & in back 2nd rep 30 sec;  1st set floor eyes open & 2nd set floor eyes closed with intermittent touch sink / chair back.   Manual  PROM with overpressure for knee flexion stretch seated  Vaso 10 mins Lt knee in elevation 34 degrees medium compression   TREATMENT         DATE: 04/22/2024 Therex:   NuStep lvl 5 for ROM, 8 minutes Seated knee extension/flexion with overpressure by the opposite leg, 1x10 LAQ w/ opposite knee flexion, 1x10 Supine 90/90 position allowing gravity to pull knee into flexion, 6x30 seconds Quad sets with heel prop, 2x10  Manual  Rocking Lt LE into flexion and extension   Vaso 10 mins Lt knee in elevation 34 degrees medium compression    PATIENT EDUCATION:  Education details: HEP Person educated: Patient Education method: Programmer, multimedia, Demonstration, Actor cues, and Verbal cues Education comprehension: verbalized understanding, returned demonstration, and verbal cues required  HOME EXERCISE PROGRAM: Access Code: [email or web address] URL: [email or web address] Date: 04/13/2024 Prepared by: Burnard Meth  Exercises - Supine Quad Set  - 2 x daily - 7 x weekly - 2 sets - 10 reps - 3 hold - Supine Active Straight Leg Raise  - 2 x daily - 7 x weekly - 2 sets - 10 reps - Supine Heel Slides  - 2 x daily - 7 x weekly - 2 sets - 10 reps - 4 hold - Mini Squat  - 2 x daily - 7 x weekly - 2 sets - 10 reps - 2 hold - Standing Heel Raise  with Support  - 2 x daily - 7 x weekly - 2 sets - 10 reps  ASSESSMENT:  CLINICAL IMPRESSION:  Pt needed VC for sit to stands for weight transfer. OBJECTIVE IMPAIRMENTS: Abnormal gait, decreased  balance, decreased ROM, decreased strength, increased edema, impaired flexibility, and pain.   ACTIVITY LIMITATIONS: bending, sitting, standing, squatting, sleeping, stairs, transfers, bed mobility, and toileting  PARTICIPATION LIMITATIONS: meal prep, cleaning, driving, shopping, and community activity  PERSONAL FACTORS: Age and Fitness are also affecting patient's functional outcome.   REHAB POTENTIAL: Good  CLINICAL DECISION MAKING: Stable/uncomplicated  EVALUATION COMPLEXITY: Moderate   GOALS: Goals reviewed with patient? Yes  SHORT TERM GOALS: Target date: 05/25/2024   Pt to be independent in HEP. Baseline: Goal status: on going 04/29/2024  2.  Decrease pain by 1-2 levels. Baseline:  Goal status: on going 04/29/2024  3.  Progress to ambulation with SPC  household distances. Baseline:  Goal status: on going 04/29/2024  LONG TERM GOALS: Target date: 07/06/2024    Increase AROM to 0> 110 to allow for improved mobility with sit to stand transfers and ADLs.  Goal status: ongoing, 04/29/2024  2.  Increase LLE strength to at least 4/5 throughout.    Goal status: ongoing, 04/22/2024  3.  Able to ambulate short community distances with [facility] (if needed) independently.  Goal status: ongoing, 04/29/2024  4.  Decrease pain to max 3/10 with rest and ADLs.  Goal status: ongoing, 04/29/2024  5.  Increase by 2+ levels in PSFS score.  Goal status: ongoing, 04/22/2024   PLAN:  PT FREQUENCY: 2x/week  PT DURATION: 12 weeks  PLANNED INTERVENTIONS: 97164- PT Re-evaluation, 97110-Therapeutic exercises, 97530- Therapeutic activity, 97112- Neuromuscular re-education, 97535- Self Care, 02859- Manual therapy, [phone]- Gait training, [phone]- Electrical stimulation (unattended), 97016- Vasopneumatic device, Patient/Family education, Balance training, Stair training, Joint mobilization, Scar mobilization, Cryotherapy, and Moist heat  PLAN FOR NEXT SESSION:  Balance challenges, strengthening,  mobility gains.   MD visit May 12, 2024   Burnard Meth, PT 05/05/24  12:55 PM

## 2024-05-07 NOTE — Therapy (Signed)
 OUTPATIENT PHYSICAL THERAPY TREATMENT   Patient Name: Cristina Sawyer MRN: [id number] DOB:1941-02-25, 83 y.o., female Today's Date: 05/10/2024  END OF SESSION:  PT End of Session - 05/10/24 1558     Visit Number 8    Number of Visits 17    Date for PT Re-Evaluation 07/06/24    Authorization Type [employer]    Progress Note Due on Visit 10    PT Start Time 1303    PT Stop Time 1341    PT Time Calculation (min) 38 min    Activity Tolerance Patient tolerated treatment well    Behavior During Therapy [facility] for tasks assessed/performed                 Past Medical History:  Diagnosis Date   Arthritis    Cataract    Chronic kidney disease    Diverticulosis    Dyslipidemia    GERD (gastroesophageal reflux disease)    HTN (hypertension) 2015   Personal history of colonic adenoma 08/09/2008   Vitamin D  deficiency    Past Surgical History:  Procedure Laterality Date   ABDOMINAL HYSTERECTOMY  1975   APPENDECTOMY  1975   COLONOSCOPY     HAND SURGERY     Cyst removal   THYROID  SURGERY  2000   to remove nodule   TOTAL KNEE ARTHROPLASTY Right 12/23/2022   Procedure: RIGHT TOTAL KNEE ARTHROPLASTY;  Surgeon: Jerri Kay HERO, MD;  Location: [facility] OR;  Service: Orthopedics;  Laterality: Right;   TOTAL KNEE ARTHROPLASTY Left 03/29/2024   Procedure: LEFT TOTAL KNEE ARTHROPLASTY;  Surgeon: Jerri Kay HERO, MD;  Location: [facility] OR;  Service: Orthopedics;  Laterality: Left;   Patient Active Problem List   Diagnosis Date Noted   Status post total left knee replacement 03/29/2024   Primary osteoarthritis of left knee 03/28/2024   Status post total right knee replacement 12/23/2022   History of GI bleed 06/06/2022   Osteopenia 08/30/2021   Aortic atherosclerosis (HCC) 07/12/2021   Diverticulosis 07/12/2021   GAD (generalized anxiety disorder) 07/05/2021   Personal history of calcium  pyrophosphate deposition disease (CPPD) 04/11/2020   History of vitamin D  deficiency 07/26/2019    Age-related cataract of both eyes 05/19/2018   Hyperlipidemia 05/19/2018   CKD (chronic kidney disease) stage 3, GFR 30-59 ml/min (HCC) 02/14/2018   Essential hypertension 05/12/2017   Transient cerebral ischemia 05/12/2017   Seasonal allergic rhinitis due to pollen 12/29/2014   GERD (gastroesophageal reflux disease) 01/17/2014   History of colonic polyps 08/09/2008    PCP: Joyce Norleen BROCKS, MD   REFERRING PROVIDER: Jerri Kay HERO, MD  REFERRING DIAG: [phone] (ICD-10-CM) - Unilateral primary osteoarthritis, left knee  THERAPY DIAG:  Difficulty in walking, not elsewhere classified  Stiffness of left knee, not elsewhere classified  Localized edema  Chronic pain of left knee  Rationale for Evaluation and Treatment: Rehabilitation  ONSET DATE: 03/29/24 surgery Left TKA  SUBJECTIVE:   SUBJECTIVE STATEMENT:   Pt reports pain still affecting sleep.  Seeing the MD this week about it.  PERTINENT HISTORY: No previous Lt knee surgery. Left TKA 03/29/24, OA, osteopenia, right TKA 12/23/22  PAIN:   NPRS scale: upon arrival today: 4/10 Pain location: Lt knee ant/posteri Pain description: dull, achy Aggravating factors: ambulation, bending Relieving factors: medication, ice, elevation  PRECAUTIONS: None  RED FLAGS: None   WEIGHT BEARING RESTRICTIONS: No  FALLS:  Has patient fallen in last 6 months? No  LIVING ENVIRONMENT: Lives with: lives with their family and  lives alone Lives in: House/apartment Stairs: No Has following equipment at home: Single point cane and Environmental consultant - 2 wheeled  OCCUPATION: retired  PLOF: Independent  PATIENT GOALS: Decrease pain with all ADLs  NEXT MD VISIT: 05/11/24  OBJECTIVE:  Note: Objective measures were completed at Evaluation unless otherwise noted.  DIAGNOSTIC FINDINGS: Pre surgery -Imaging-X-rays demonstrate advanced tricompartmental degenerative changes.  No  acute fracture noted.    PATIENT SURVEYS:  PSFS: THE PATIENT SPECIFIC  FUNCTIONAL SCALE  Place score of 0-10 (0 = unable to perform activity and 10 = able to perform activity at the same level as before injury or problem)  Activity Date: 04/13/24    driving 6    2.sleeping 5    3.sit to stand 4    4.      Total Score 5      Total Score = Sum of activity scores/number of activities  Minimally Detectable Change: 3 points (for single activity); 2 points (for average score)   COGNITION: 04/13/2024 Overall cognitive status: Within functional limits for tasks assessed     SENSATION: 04/13/2024 [facility]  EDEMA:  04/13/2024 Not specifically recorded   MUSCLE LENGTH: 04/13/2024 Hamstrings: Right mild restriction ; Left moderate restriction  POSTURE:  04/13/2024 forward head  PALPATION: 04/13/2024 1+ tenderness at joint line and along incision  LOWER EXTREMITY ROM:   AROM/PROM Left Eval 04/13/2024 Left 04/19/24 Left 04/27/24 Left 04/29/2024  Hip flexion      Hip extension      Hip abduction      Hip adduction      Hip internal rotation      Hip external rotation      Knee flexion Seated 74/84 Supine  82/85 85 supine AROM heel slide 86* supine AAROM 91* with strap  Knee extension Supine +5/0   Lacks 3*  Ankle dorsiflexion      Ankle plantarflexion      Ankle inversion      Ankle eversion       (Blank rows = not tested)  LOWER EXTREMITY MMT:  MMT Left Eval 04/13/2024  Hip flexion 4-  Hip extension   Hip abduction 4-  Hip adduction   Hip internal rotation   Hip external rotation   Knee flexion 3+  Knee extension 3+  Ankle dorsiflexion 3+  Ankle plantarflexion   Ankle inversion   Ankle eversion    (Blank rows = not tested)  LOWER EXTREMITY SPECIAL TESTS:  04/13/2024 No special tests completed  FUNCTIONAL TESTS:  04/13/2024 5 times sit to stand: unable to complete  GAIT: 04/19/2024:   Ambulation into clinic c SPC in Rt UE.   04/13/2024 Distance walked: 50 feet Assistive device utilized: Single point cane and Walker  - 2 wheeled Level of assistance: SBA                     TREATMENT         DATE:  05/10/24 Therex: Rec bike  seat 6/7 5 min Incline gastroc 30 sec x 3 bialteral Supine SAQ  10x no weight 10x 2# Seated Lt knee flexion with slider    TherActivity (to improve standing, walking, transfers, stairs) Leg press double leg 75 lbs 2x 15 in available range Leg press single leg 25 lbs Lt leg 2 x 15 in available range Sit to stand to sit 20.5 inch table 2x 8 no UE assist - cues to avoid shifting weight to Rt leg.  Squats 2x10 Lean ins on  6 inch step  Step up and over on 6 inch  Manual   05/05/24 Therex: Nustep level 4 8 min Incline gastroc 30 sec x 3 bialteral Supine SAQ  10x no weight 10x 2# Seated Lt knee flexion with slider    TherActivity (to improve standing, walking, transfers, stairs) Leg press double leg 75 lbs 2x 15 in available range Leg press single leg 25 lbs Lt leg 2 x 15 in available range Sit to stand to sit 20.5 inch table 2x 8 no UE assist - cues to avoid shifting weight to Rt leg.    Manual Lt knee PROM  05/03/2024 Therex: Recumbent bike seat 6 with partial circles stretching for ROM 7 mins.  Incline gastroc 30 sec x 3 bialteral Seated Lt leg LAQ with end range pauses 2.5 lbs 2 x 15 Seated Lt knee flexion green band x 15  TherActivity (to improve standing, walking, transfers, stairs) Leg press double leg 75 lbs x 15 in available range Leg press single leg 25 lbs Lt leg 2 x 15 in available range Sit to stand to sit 20.5 inch table x 10 no UE assist - cues to avoid shifting weight to Rt leg.    Manual Lt knee flexion c distraction/IR mobilization c movement    TREATMENT         DATE: 04/29/2024 Sci Fit Bike Seat 8 AAROM to full range 5 minutes Recumbent bike Seat 5 for 5 minutes AAROM to full range (recommend next visit) Quad sets with heel prop 2 sets of 10 for 5 seconds Supine knee flexion with strap 10 x 5 seconds T[place]gate knee flexion 1  minute  Functional Activities (for sit to stand/stairs): Double Leg Press 75# 15 x slow eccentrics and stretch into flexion Single Leg Press 25# 10 x slow eccentrics and stretch into flexion  Vaso Left Knee 10 minutes Medium 34*   TREATMENT         DATE: 04/27/2024 Therex:   SciFit Bike seat 9 with BLEs & BUEs with rocking flexion stretch ROM, 8 minutes Seated heel slide with foot on wash cloth (reduced resistance) knee extension quad set and active flexion with overpressure by the opposite leg for end range flexion, 1x10 Supine heel slide with lower leg on ball and strap assistance with knee extension quad set 5 seconds and active assisted knee flexion 5-second hold 10 reps  Therapeutic Activities: Leg press BLEs 87# 10 reps 2 sets;   Tandem stance LLE in front 1st rep & in back 2nd rep 30 sec;  1st set floor eyes open & 2nd set floor eyes closed with intermittent touch sink / chair back.   Manual  PROM with overpressure for knee flexion stretch seated  Vaso 10 mins Lt knee in elevation 34 degrees medium compression   TREATMENT         DATE: 04/22/2024 Therex:   NuStep lvl 5 for ROM, 8 minutes Seated knee extension/flexion with overpressure by the opposite leg, 1x10 LAQ w/ opposite knee flexion, 1x10 Supine 90/90 position allowing gravity to pull knee into flexion, 6x30 seconds Quad sets with heel prop, 2x10  Manual  Rocking Lt LE into flexion and extension   Vaso 10 mins Lt knee in elevation 34 degrees medium compression    PATIENT EDUCATION:  Education details: HEP Person educated: Patient Education method: Programmer, multimedia, Demonstration, Actor cues, and Verbal cues Education comprehension: verbalized understanding, returned demonstration, and verbal cues required  HOME EXERCISE PROGRAM: Access Code: [email or web address] URL: [email or web address] Date:  04/13/2024 Prepared by: Burnard Meth  Exercises - Supine Quad Set  - 2 x daily - 7 x weekly - 2 sets - 10 reps  - 3 hold - Supine Active Straight Leg Raise  - 2 x daily - 7 x weekly - 2 sets - 10 reps - Supine Heel Slides  - 2 x daily - 7 x weekly - 2 sets - 10 reps - 4 hold - Mini Squat  - 2 x daily - 7 x weekly - 2 sets - 10 reps - 2 hold - Standing Heel Raise with Support  - 2 x daily - 7 x weekly - 2 sets - 10 reps  ASSESSMENT:  CLINICAL IMPRESSION: Pt needed VC for bending knee to reach to the step- tended to swing leg around.  After all flexion exercises, improved knee flexion in swing phase of ambulation. OBJECTIVE IMPAIRMENTS: Abnormal gait, decreased balance, decreased ROM, decreased strength, increased edema, impaired flexibility, and pain.   ACTIVITY LIMITATIONS: bending, sitting, standing, squatting, sleeping, stairs, transfers, bed mobility, and toileting  PARTICIPATION LIMITATIONS: meal prep, cleaning, driving, shopping, and community activity  PERSONAL FACTORS: Age and Fitness are also affecting patient's functional outcome.   REHAB POTENTIAL: Good  CLINICAL DECISION MAKING: Stable/uncomplicated  EVALUATION COMPLEXITY: Moderate   GOALS: Goals reviewed with patient? Yes  SHORT TERM GOALS: Target date: 05/25/2024   Pt to be independent in HEP. Baseline: Goal status: on going 04/29/2024  2.  Decrease pain by 1-2 levels. Baseline:  Goal status: on going 04/29/2024  3.  Progress to ambulation with SPC  household distances. Baseline:  Goal status: on going 04/29/2024  LONG TERM GOALS: Target date: 07/06/2024    Increase AROM to 0> 110 to allow for improved mobility with sit to stand transfers and ADLs.  Goal status: ongoing, 04/29/2024  2.  Increase LLE strength to at least 4/5 throughout.    Goal status: ongoing, 04/22/2024  3.  Able to ambulate short community distances with [facility] (if needed) independently.  Goal status: ongoing, 04/29/2024  4.  Decrease pain to max 3/10 with rest and ADLs.  Goal status: ongoing, 04/29/2024  5.  Increase by 2+ levels in PSFS  score.  Goal status: ongoing, 04/22/2024   PLAN:  PT FREQUENCY: 2x/week  PT DURATION: 12 weeks  PLANNED INTERVENTIONS: 97164- PT Re-evaluation, 97110-Therapeutic exercises, 97530- Therapeutic activity, 97112- Neuromuscular re-education, 97535- Self Care, [id number]- Manual therapy, [phone]- Gait training, [phone]- Electrical stimulation (unattended), 97016- Vasopneumatic device, Patient/Family education, Balance training, Stair training, Joint mobilization, Scar mobilization, Cryotherapy, and Moist heat  PLAN FOR NEXT SESSION:  Balance challenges, strengthening, mobility gains.   MD visit May 12, 2024   Burnard Meth, PT 05/10/24  3:59 PM

## 2024-05-10 ENCOUNTER — Ambulatory Visit

## 2024-05-10 DIAGNOSIS — G8929 Other chronic pain: Secondary | ICD-10-CM

## 2024-05-10 DIAGNOSIS — M25562 Pain in left knee: Secondary | ICD-10-CM

## 2024-05-10 DIAGNOSIS — R262 Difficulty in walking, not elsewhere classified: Secondary | ICD-10-CM | POA: Diagnosis not present

## 2024-05-10 DIAGNOSIS — M25662 Stiffness of left knee, not elsewhere classified: Secondary | ICD-10-CM

## 2024-05-10 DIAGNOSIS — R600 Localized edema: Secondary | ICD-10-CM | POA: Diagnosis not present

## 2024-05-11 DIAGNOSIS — H353233 Exudative age-related macular degeneration, bilateral, with inactive scar: Secondary | ICD-10-CM | POA: Diagnosis not present

## 2024-05-11 DIAGNOSIS — H2513 Age-related nuclear cataract, bilateral: Secondary | ICD-10-CM | POA: Diagnosis not present

## 2024-05-11 DIAGNOSIS — H43393 Other vitreous opacities, bilateral: Secondary | ICD-10-CM | POA: Diagnosis not present

## 2024-05-11 NOTE — Therapy (Signed)
 OUTPATIENT PHYSICAL THERAPY TREATMENT   Patient Name: Cristina Sawyer MRN: [id number] DOB:January 12, 1941, 83 y.o., female Today's Date: 05/12/2024  END OF SESSION:  PT End of Session - 05/12/24 1348     Visit Number 9    Number of Visits 17    Date for PT Re-Evaluation 07/06/24    Authorization Type [employer]    Progress Note Due on Visit 10    PT Start Time 1118    PT Stop Time 1206    PT Time Calculation (min) 48 min    Activity Tolerance Patient tolerated treatment well    Behavior During Therapy [facility] for tasks assessed/performed                  Past Medical History:  Diagnosis Date   Arthritis    Cataract    Chronic kidney disease    Diverticulosis    Dyslipidemia    GERD (gastroesophageal reflux disease)    HTN (hypertension) 2015   Personal history of colonic adenoma 08/09/2008   Vitamin D  deficiency    Past Surgical History:  Procedure Laterality Date   ABDOMINAL HYSTERECTOMY  1975   APPENDECTOMY  1975   COLONOSCOPY     HAND SURGERY     Cyst removal   THYROID  SURGERY  2000   to remove nodule   TOTAL KNEE ARTHROPLASTY Right 12/23/2022   Procedure: RIGHT TOTAL KNEE ARTHROPLASTY;  Surgeon: Jerri Kay HERO, MD;  Location: [facility] OR;  Service: Orthopedics;  Laterality: Right;   TOTAL KNEE ARTHROPLASTY Left 03/29/2024   Procedure: LEFT TOTAL KNEE ARTHROPLASTY;  Surgeon: Jerri Kay HERO, MD;  Location: [facility] OR;  Service: Orthopedics;  Laterality: Left;   Patient Active Problem List   Diagnosis Date Noted   Status post total left knee replacement 03/29/2024   Primary osteoarthritis of left knee 03/28/2024   Status post total right knee replacement 12/23/2022   History of GI bleed 06/06/2022   Osteopenia 08/30/2021   Aortic atherosclerosis (HCC) 07/12/2021   Diverticulosis 07/12/2021   GAD (generalized anxiety disorder) 07/05/2021   Personal history of calcium  pyrophosphate deposition disease (CPPD) 04/11/2020   History of vitamin D  deficiency 07/26/2019    Age-related cataract of both eyes 05/19/2018   Hyperlipidemia 05/19/2018   CKD (chronic kidney disease) stage 3, GFR 30-59 ml/min (HCC) 02/14/2018   Essential hypertension 05/12/2017   Transient cerebral ischemia 05/12/2017   Seasonal allergic rhinitis due to pollen 12/29/2014   GERD (gastroesophageal reflux disease) 01/17/2014   History of colonic polyps 08/09/2008    PCP: Joyce Norleen BROCKS, MD   REFERRING PROVIDER: Jerri Kay HERO, MD  REFERRING DIAG: [phone] (ICD-10-CM) - Unilateral primary osteoarthritis, left knee  THERAPY DIAG:  Difficulty in walking, not elsewhere classified  Stiffness of left knee, not elsewhere classified  Localized edema  Chronic pain of left knee  Rationale for Evaluation and Treatment: Rehabilitation  ONSET DATE: 03/29/24 surgery Left TKA  SUBJECTIVE:   SUBJECTIVE STATEMENT:   Pt reports seeing MD.  Will continue with prescribed meds for pain control at night and before PT.     PERTINENT HISTORY: No previous Lt knee surgery. Left TKA 03/29/24, OA, osteopenia, right TKA 12/23/22  PAIN:   NPRS scale: upon arrival today: 5/10 Pain location: Lt knee ant/posteri Pain description: dull, achy Aggravating factors: ambulation, bending Relieving factors: medication, ice, elevation  PRECAUTIONS: None  RED FLAGS: None   WEIGHT BEARING RESTRICTIONS: No  FALLS:  Has patient fallen in last 6 months? No  LIVING  ENVIRONMENT: Lives with: lives with their family and lives alone Lives in: House/apartment Stairs: No Has following equipment at home: Single point cane and Environmental consultant - 2 wheeled  OCCUPATION: retired  PLOF: Independent  PATIENT GOALS: Decrease pain with all ADLs  NEXT MD VISIT: 05/11/24  OBJECTIVE:  Note: Objective measures were completed at Evaluation unless otherwise noted.  DIAGNOSTIC FINDINGS: Pre surgery -Imaging-X-rays demonstrate advanced tricompartmental degenerative changes.  No  acute fracture noted.    PATIENT SURVEYS:   PSFS: THE PATIENT SPECIFIC FUNCTIONAL SCALE  Place score of 0-10 (0 = unable to perform activity and 10 = able to perform activity at the same level as before injury or problem)  Activity Date: 04/13/24    driving 6    2.sleeping 5    3.sit to stand 4    4.      Total Score 5      Total Score = Sum of activity scores/number of activities  Minimally Detectable Change: 3 points (for single activity); 2 points (for average score)   COGNITION: 04/13/2024 Overall cognitive status: Within functional limits for tasks assessed     SENSATION: 04/13/2024 [facility]  EDEMA:  04/13/2024 Not specifically recorded   MUSCLE LENGTH: 04/13/2024 Hamstrings: Right mild restriction ; Left moderate restriction  POSTURE:  04/13/2024 forward head  PALPATION: 04/13/2024 1+ tenderness at joint line and along incision  LOWER EXTREMITY ROM:   AROM/PROM Left Eval 04/13/2024 Left 04/19/24 Left 04/27/24 Left 04/29/2024  Hip flexion      Hip extension      Hip abduction      Hip adduction      Hip internal rotation      Hip external rotation      Knee flexion Seated 74/84 Supine  82/85 85 supine AROM heel slide 86* supine AAROM 91* with strap  Knee extension Supine +5/0   Lacks 3*  Ankle dorsiflexion      Ankle plantarflexion      Ankle inversion      Ankle eversion       (Blank rows = not tested)  LOWER EXTREMITY MMT:  MMT Left Eval 04/13/2024  Hip flexion 4-  Hip extension   Hip abduction 4-  Hip adduction   Hip internal rotation   Hip external rotation   Knee flexion 3+  Knee extension 3+  Ankle dorsiflexion 3+  Ankle plantarflexion   Ankle inversion   Ankle eversion    (Blank rows = not tested)  LOWER EXTREMITY SPECIAL TESTS:  04/13/2024 No special tests completed  FUNCTIONAL TESTS:  04/13/2024 5 times sit to stand: unable to complete  GAIT: 04/19/2024:   Ambulation into clinic c SPC in Rt UE.   04/13/2024 Distance walked: 50 feet Assistive device utilized:  Single point cane and Walker - 2 wheeled Level of assistance: SBA                     TREATMENT         DATE:  05/12/24 Therex: Rec bike  seat 6/7 5 min  1/2 circles no resistance Incline gastroc 30 sec x 3 bialteral Supine SAQ  10x no weight 10x 2# Seated Lt knee flexion with slider    TherActivity (to improve standing, walking, transfers, stairs) Leg press double leg 75 lbs 3x10 in available range Leg press single leg 25 lbs Lt leg 2 x 15 in available range Squats 2x10 Lean ins on 6 inch step  Step up and over on 6 inch  Vaso at 36 degrees medium compression   05/10/24 Therex: Rec bike  seat 6/7 5 min  1/2 circles no resistance Incline gastroc 30 sec x 3 bialteral Supine SAQ  10x no weight 10x 2# Seated Lt knee flexion with slider    TherActivity (to improve standing, walking, transfers, stairs) Leg press double leg 75 lbs 2x 15 in available range Leg press single leg 25 lbs Lt leg 2 x 15 in available range Sit to stand to sit 20.5 inch table 2x 8 no UE assist - cues to avoid shifting weight to Rt leg.  Squats 2x10 Lean ins on 6 inch step  Step up and over on 6 inch  Manual   05/05/24 Therex: Nustep level 4 8 min Incline gastroc 30 sec x 3 bialteral Supine SAQ  10x no weight 10x 2# Seated Lt knee flexion with slider    TherActivity (to improve standing, walking, transfers, stairs) Leg press double leg 75 lbs 2x 15 in available range Leg press single leg 25 lbs Lt leg 2 x 15 in available range Sit to stand to sit 20.5 inch table 2x 8 no UE assist - cues to avoid shifting weight to Rt leg.    Manual Lt knee PROM  05/03/2024 Therex: Recumbent bike seat 6 with partial circles stretching for ROM 7 mins.  Incline gastroc 30 sec x 3 bialteral Seated Lt leg LAQ with end range pauses 2.5 lbs 2 x 15 Seated Lt knee flexion green band x 15  TherActivity (to improve standing, walking, transfers, stairs) Leg press double leg 75 lbs x 15 in available range Leg press  single leg 25 lbs Lt leg 2 x 15 in available range Sit to stand to sit 20.5 inch table x 10 no UE assist - cues to avoid shifting weight to Rt leg.    Manual Lt knee flexion c distraction/IR mobilization c movement    TREATMENT         DATE: 04/29/2024 Sci Fit Bike Seat 8 AAROM to full range 5 minutes Recumbent bike Seat 5 for 5 minutes AAROM to full range (recommend next visit) Quad sets with heel prop 2 sets of 10 for 5 seconds Supine knee flexion with strap 10 x 5 seconds Tailgate knee flexion 1 minute  Functional Activities (for sit to stand/stairs): Double Leg Press 75# 15 x slow eccentrics and stretch into flexion Single Leg Press 25# 10 x slow eccentrics and stretch into flexion  Vaso Left Knee 10 minutes Medium 34*   TREATMENT         DATE: 04/27/2024 Therex:   SciFit Bike seat 9 with BLEs & BUEs with rocking flexion stretch ROM, 8 minutes Seated heel slide with foot on wash cloth (reduced resistance) knee extension quad set and active flexion with overpressure by the opposite leg for end range flexion, 1x10 Supine heel slide with lower leg on ball and strap assistance with knee extension quad set 5 seconds and active assisted knee flexion 5-second hold 10 reps  Therapeutic Activities: Leg press BLEs 87# 10 reps 2 sets;   Tandem stance LLE in front 1st rep & in back 2nd rep 30 sec;  1st set floor eyes open & 2nd set floor eyes closed with intermittent touch sink / chair back.   Manual  PROM with overpressure for knee flexion stretch seated  Vaso 10 mins Lt knee in elevation 34 degrees medium compression   TREATMENT         DATE: 04/22/2024 Therex:  NuStep lvl 5 for ROM, 8 minutes Seated knee extension/flexion with overpressure by the opposite leg, 1x10 LAQ w/ opposite knee flexion, 1x10 Supine 90/90 position allowing gravity to pull knee into flexion, 6x30 seconds Quad sets with heel prop, 2x10  Manual  Rocking Lt LE into flexion and extension   Vaso 10 mins  Lt knee in elevation 34 degrees medium compression    PATIENT EDUCATION:  Education details: HEP Person educated: Patient Education method: Programmer, multimedia, Demonstration, Actor cues, and Verbal cues Education comprehension: verbalized understanding, returned demonstration, and verbal cues required  HOME EXERCISE PROGRAM: Access Code: [email or web address] URL: [email or web address] Date: 04/13/2024 Prepared by: Burnard Meth  Exercises - Supine Quad Set  - 2 x daily - 7 x weekly - 2 sets - 10 reps - 3 hold - Supine Active Straight Leg Raise  - 2 x daily - 7 x weekly - 2 sets - 10 reps - Supine Heel Slides  - 2 x daily - 7 x weekly - 2 sets - 10 reps - 4 hold - Mini Squat  - 2 x daily - 7 x weekly - 2 sets - 10 reps - 2 hold - Standing Heel Raise with Support  - 2 x daily - 7 x weekly - 2 sets - 10 reps  ASSESSMENT:  CLINICAL IMPRESSION: Added vaso treatment back in for pain control.  Pt reported feeling better after treatment.   OBJECTIVE IMPAIRMENTS: Abnormal gait, decreased balance, decreased ROM, decreased strength, increased edema, impaired flexibility, and pain.   ACTIVITY LIMITATIONS: bending, sitting, standing, squatting, sleeping, stairs, transfers, bed mobility, and toileting  PARTICIPATION LIMITATIONS: meal prep, cleaning, driving, shopping, and community activity  PERSONAL FACTORS: Age and Fitness are also affecting patient's functional outcome.   REHAB POTENTIAL: Good  CLINICAL DECISION MAKING: Stable/uncomplicated  EVALUATION COMPLEXITY: Moderate   GOALS: Goals reviewed with patient? Yes  SHORT TERM GOALS: Target date: 05/25/2024   Pt to be independent in HEP. Baseline: Goal status: on going 04/29/2024  2.  Decrease pain by 1-2 levels. Baseline:  Goal status: on going 04/29/2024  3.  Progress to ambulation with SPC  household distances. Baseline:  Goal status: on going 04/29/2024  LONG TERM GOALS: Target date: 07/06/2024    Increase AROM to 0> 110  to allow for improved mobility with sit to stand transfers and ADLs.  Goal status: ongoing, 04/29/2024  2.  Increase LLE strength to at least 4/5 throughout.    Goal status: ongoing, 04/22/2024  3.  Able to ambulate short community distances with [facility] (if needed) independently.  Goal status: ongoing, 04/29/2024  4.  Decrease pain to max 3/10 with rest and ADLs.  Goal status: ongoing, 04/29/2024  5.  Increase by 2+ levels in PSFS score.  Goal status: ongoing, 04/22/2024   PLAN:  PT FREQUENCY: 2x/week  PT DURATION: 12 weeks  PLANNED INTERVENTIONS: 97164- PT Re-evaluation, 97110-Therapeutic exercises, 97530- Therapeutic activity, 97112- Neuromuscular re-education, 97535- Self Care, 02859- Manual therapy, [phone]- Gait training, [phone]- Electrical stimulation (unattended), 97016- Vasopneumatic device, Patient/Family education, Balance training, Stair training, Joint mobilization, Scar mobilization, Cryotherapy, and Moist heat  PLAN FOR NEXT SESSION:  Balance challenges, flexion work  MD visit May 12, 2024   Burnard Meth, PT 05/12/24  1:50 PM

## 2024-05-12 ENCOUNTER — Ambulatory Visit (INDEPENDENT_AMBULATORY_CARE_PROVIDER_SITE_OTHER)

## 2024-05-12 ENCOUNTER — Ambulatory Visit (INDEPENDENT_AMBULATORY_CARE_PROVIDER_SITE_OTHER): Payer: Self-pay

## 2024-05-12 ENCOUNTER — Ambulatory Visit (INDEPENDENT_AMBULATORY_CARE_PROVIDER_SITE_OTHER): Admitting: Orthopaedic Surgery

## 2024-05-12 ENCOUNTER — Encounter: Payer: Self-pay | Admitting: Orthopaedic Surgery

## 2024-05-12 DIAGNOSIS — R600 Localized edema: Secondary | ICD-10-CM | POA: Diagnosis not present

## 2024-05-12 DIAGNOSIS — Z96652 Presence of left artificial knee joint: Secondary | ICD-10-CM | POA: Diagnosis not present

## 2024-05-12 DIAGNOSIS — M25562 Pain in left knee: Secondary | ICD-10-CM | POA: Diagnosis not present

## 2024-05-12 DIAGNOSIS — R262 Difficulty in walking, not elsewhere classified: Secondary | ICD-10-CM | POA: Diagnosis not present

## 2024-05-12 DIAGNOSIS — M25662 Stiffness of left knee, not elsewhere classified: Secondary | ICD-10-CM

## 2024-05-12 DIAGNOSIS — G8929 Other chronic pain: Secondary | ICD-10-CM | POA: Diagnosis not present

## 2024-05-12 MED ORDER — TRAMADOL HCL 50 MG PO TABS: 50.0000 mg | ORAL_TABLET | Freq: Every day | ORAL | 0 refills | Status: DC | PRN

## 2024-05-12 MED ORDER — HYDROCODONE-ACETAMINOPHEN 5-325 MG PO TABS: 1.0000 | ORAL_TABLET | Freq: Every day | ORAL | 0 refills | Status: DC | PRN

## 2024-05-12 MED ORDER — METHOCARBAMOL 500 MG PO TABS: 500.0000 mg | ORAL_TABLET | Freq: Two times a day (BID) | ORAL | 2 refills | Status: DC | PRN

## 2024-05-12 NOTE — Progress Notes (Signed)
 Post-Op Visit Note   Patient: Cristina Sawyer           Date of Birth: 07-Jan-1941           MRN: [id number] Visit Date: 05/12/2024 PCP: Joyce Norleen BROCKS, MD   Assessment & Plan:  Chief Complaint:  Chief Complaint  Patient presents with   Left Knee - Follow-up    Left total knee arthroplasty 03/29/2024   Visit Diagnoses:  1. Status post total left knee replacement     Plan: Cristina Sawyer is 6 weeks postop.  She is in outpatient physical therapy.  Overall doing okay.  Exam shows fully healed surgical scar.  Range of motion is progressing past 90 degrees.  Overall she is making appropriate progress.  Medications refilled.  Recheck in 6 weeks.  Instructions reviewed.  Follow-Up Instructions: Return in about 6 weeks (around 06/23/2024).   Orders:  Orders Placed This Encounter  Procedures   XR Knee 1-2 Views Left   Meds ordered this encounter  Medications   HYDROcodone -acetaminophen  (NORCO/VICODIN) 5-325 MG tablet    Sig: Take 1-2 tablets by mouth daily as needed for moderate pain (pain score 4-6).    Dispense:  20 tablet    Refill:  0   traMADol  (ULTRAM ) 50 MG tablet    Sig: Take 1-2 tablets (50-100 mg total) by mouth daily as needed.    Dispense:  20 tablet    Refill:  0   methocarbamol  (ROBAXIN ) 500 MG tablet    Sig: Take 1 tablet (500 mg total) by mouth 2 (two) times daily as needed.    Dispense:  30 tablet    Refill:  2    Imaging: No results found.  PMFS History: Patient Active Problem List   Diagnosis Date Noted   Status post total left knee replacement 03/29/2024   Primary osteoarthritis of left knee 03/28/2024   Status post total right knee replacement 12/23/2022   History of GI bleed 06/06/2022   Osteopenia 08/30/2021   Aortic atherosclerosis (HCC) 07/12/2021   Diverticulosis 07/12/2021   GAD (generalized anxiety disorder) 07/05/2021   Personal history of calcium  pyrophosphate deposition disease (CPPD) 04/11/2020   History of vitamin D  deficiency  07/26/2019   Age-related cataract of both eyes 05/19/2018   Hyperlipidemia 05/19/2018   CKD (chronic kidney disease) stage 3, GFR 30-59 ml/min (HCC) 02/14/2018   Essential hypertension 05/12/2017   Transient cerebral ischemia 05/12/2017   Seasonal allergic rhinitis due to pollen 12/29/2014   GERD (gastroesophageal reflux disease) 01/17/2014   History of colonic polyps 08/09/2008   Past Medical History:  Diagnosis Date   Arthritis    Cataract    Chronic kidney disease    Diverticulosis    Dyslipidemia    GERD (gastroesophageal reflux disease)    HTN (hypertension) 2015   Personal history of colonic adenoma 08/09/2008   Vitamin D  deficiency     Family History  Problem Relation Age of Onset   Heart disease Sister    Heart disease Sister        in her 21s   Colon cancer Neg Hx     Past Surgical History:  Procedure Laterality Date   ABDOMINAL HYSTERECTOMY  1975   APPENDECTOMY  1975   COLONOSCOPY     HAND SURGERY     Cyst removal   THYROID  SURGERY  2000   to remove nodule   TOTAL KNEE ARTHROPLASTY Right 12/23/2022   Procedure: RIGHT TOTAL KNEE ARTHROPLASTY;  Surgeon: Jerri Kay HERO, MD;  Location: [facility] OR;  Service: Orthopedics;  Laterality: Right;   TOTAL KNEE ARTHROPLASTY Left 03/29/2024   Procedure: LEFT TOTAL KNEE ARTHROPLASTY;  Surgeon: Jerri Kay HERO, MD;  Location: [facility] OR;  Service: Orthopedics;  Laterality: Left;   Social History   Occupational History   Occupation: Retired from Art therapist  Tobacco Use   Smoking status: Never   Smokeless tobacco: Never  Vaping Use   Vaping status: Never Used  Substance and Sexual Activity   Alcohol  use: Not Currently    Alcohol /week: 1.0 standard drink of alcohol     Types: 1 Glasses of wine per week   Drug use: Yes    Types: Hydrocodone    Sexual activity: Yes

## 2024-05-13 ENCOUNTER — Encounter

## 2024-05-14 NOTE — Therapy (Addendum)
 OUTPATIENT PHYSICAL THERAPY TREATMENT/PROGRESS  Progress Note Reporting Period  to 05/18/24  See note below for Objective Data and Assessment of Progress/Goals.       Patient Name: Cristina Sawyer MRN: [id number] DOB:21-Mar-1941, 83 y.o., female Today's Date: 05/18/2024  END OF SESSION:  PT End of Session - 05/18/24 1508     Visit Number 10    Number of Visits 17    Date for PT Re-Evaluation 07/06/24    Authorization Type [employer]    PT Start Time 1430    PT Stop Time 1518    PT Time Calculation (min) 48 min    Activity Tolerance Patient tolerated treatment well    Behavior During Therapy [facility] for tasks assessed/performed                   Past Medical History:  Diagnosis Date   Arthritis    Cataract    Chronic kidney disease    Diverticulosis    Dyslipidemia    GERD (gastroesophageal reflux disease)    HTN (hypertension) 2015   Personal history of colonic adenoma 08/09/2008   Vitamin D  deficiency    Past Surgical History:  Procedure Laterality Date   ABDOMINAL HYSTERECTOMY  1975   APPENDECTOMY  1975   COLONOSCOPY     HAND SURGERY     Cyst removal   THYROID  SURGERY  2000   to remove nodule   TOTAL KNEE ARTHROPLASTY Right 12/23/2022   Procedure: RIGHT TOTAL KNEE ARTHROPLASTY;  Surgeon: Jerri Kay HERO, MD;  Location: [facility] OR;  Service: Orthopedics;  Laterality: Right;   TOTAL KNEE ARTHROPLASTY Left 03/29/2024   Procedure: LEFT TOTAL KNEE ARTHROPLASTY;  Surgeon: Jerri Kay HERO, MD;  Location: [facility] OR;  Service: Orthopedics;  Laterality: Left;   Patient Active Problem List   Diagnosis Date Noted   Status post total left knee replacement 03/29/2024   Primary osteoarthritis of left knee 03/28/2024   Status post total right knee replacement 12/23/2022   History of GI bleed 06/06/2022   Osteopenia 08/30/2021   Aortic atherosclerosis (HCC) 07/12/2021   Diverticulosis 07/12/2021   GAD (generalized anxiety disorder) 07/05/2021   Personal history of calcium   pyrophosphate deposition disease (CPPD) 04/11/2020   History of vitamin D  deficiency 07/26/2019   Age-related cataract of both eyes 05/19/2018   Hyperlipidemia 05/19/2018   CKD (chronic kidney disease) stage 3, GFR 30-59 ml/min (HCC) 02/14/2018   Essential hypertension 05/12/2017   Transient cerebral ischemia 05/12/2017   Seasonal allergic rhinitis due to pollen 12/29/2014   GERD (gastroesophageal reflux disease) 01/17/2014   History of colonic polyps 08/09/2008    PCP: Joyce Norleen BROCKS, MD   REFERRING PROVIDER: Jerri Kay HERO, MD  REFERRING DIAG: [id number] (ICD-10-CM) - Unilateral primary osteoarthritis, left knee  THERAPY DIAG:  Difficulty in walking, not elsewhere classified  Stiffness of left knee, not elsewhere classified  Localized edema  Chronic pain of left knee  Rationale for Evaluation and Treatment: Rehabilitation  ONSET DATE: 03/29/24 surgery Left TKA  SUBJECTIVE:   SUBJECTIVE STATEMENT:   Pt states increased activity and sqts at home.  Notices less pain overall.  PERTINENT HISTORY: No previous Lt knee surgery. Left TKA 03/29/24, OA, osteopenia, right TKA 12/23/22  PAIN:   NPRS scale: upon arrival today: 4/10 Pain location: Lt knee ant/posteri Pain description: dull, achy Aggravating factors: ambulation, bending Relieving factors: medication, ice, elevation  PRECAUTIONS: None  RED FLAGS: None   WEIGHT BEARING RESTRICTIONS: No  FALLS:  Has patient fallen  in last 6 months? No  LIVING ENVIRONMENT: Lives with: lives with their family and lives alone Lives in: House/apartment Stairs: No Has following equipment at home: Single point cane and Environmental consultant - 2 wheeled  OCCUPATION: retired  PLOF: Independent  PATIENT GOALS: Decrease pain with all ADLs  NEXT MD VISIT: 05/11/24  OBJECTIVE:  Note: Objective measures were completed at Evaluation unless otherwise noted.  DIAGNOSTIC FINDINGS: Pre surgery -Imaging-X-rays demonstrate advanced tricompartmental  degenerative changes.  No  acute fracture noted.    PATIENT SURVEYS:  PSFS: THE PATIENT SPECIFIC FUNCTIONAL SCALE  Place score of 0-10 (0 = unable to perform activity and 10 = able to perform activity at the same level as before injury or problem)  Activity Date: 04/13/24 05/18/24   driving 6 10   2.sleeping 5 8   3.sit to stand 4 10   4.      Total Score 5 9.33     Total Score = Sum of activity scores/number of activities  Minimally Detectable Change: 3 points (for single activity); 2 points (for average score)   COGNITION: 04/13/2024 Overall cognitive status: Within functional limits for tasks assessed     SENSATION: 04/13/2024 [facility]  EDEMA:  04/13/2024 Not specifically recorded   MUSCLE LENGTH: 04/13/2024 Hamstrings: Right mild restriction ; Left moderate restriction  POSTURE:  04/13/2024 forward head  PALPATION: 04/13/2024 1+ tenderness at joint line and along incision  LOWER EXTREMITY ROM:   AROM/PROM Left Eval 04/13/2024 Left 04/19/24 Left 04/27/24 Left 04/29/2024 Left  9/25  Hip flexion       Hip extension       Hip abduction       Hip adduction       Hip internal rotation       Hip external rotation       Knee flexion Seated 74/84 Supine  82/85 85 supine AROM heel slide 86* supine AAROM 91* with strap   Knee extension Supine +5/0   Lacks 3*   Ankle dorsiflexion       Ankle plantarflexion       Ankle inversion       Ankle eversion        (Blank rows = not tested)  LOWER EXTREMITY MMT:  MMT Left Eval 04/13/2024  Hip flexion 4-  Hip extension   Hip abduction 4-  Hip adduction   Hip internal rotation   Hip external rotation   Knee flexion 3+  Knee extension 3+  Ankle dorsiflexion 3+  Ankle plantarflexion   Ankle inversion   Ankle eversion    (Blank rows = not tested)  LOWER EXTREMITY SPECIAL TESTS:  04/13/2024 No special tests completed  FUNCTIONAL TESTS:  04/13/2024 5 times sit to stand: unable to  complete  GAIT: 04/19/2024:   Ambulation into clinic c SPC in Rt UE.   04/13/2024 Distance walked: 50 feet Assistive device utilized: Single point cane and Walker - 2 wheeled Level of assistance: SBA                     TREATMENT         DATE:  05/18/24 TherActivity (to improve standing, walking, transfers, stairs) Leg press double leg 81 lbs 3x10 in available range Leg press single leg 25 lbs Lt leg 2 x 15 in available range Squats 2x10 Lean ins on 6 inch step 12x Step up and tap on 6 inch Therex: Rec bike  seat 6/7 5 min  1/2 circles no resistance Incline gastroc  30 sec x 3 bialteral Supine SAQ  10x no weight 10x 2# Seated Lt knee flexion with slider   Vaso at 36 degrees medium compression   05/12/24 Therex: Rec bike  seat 6/7 5 min  1/2 circles no resistance Incline gastroc 30 sec x 3 bialteral Supine SAQ  10x no weight 10x 2# Seated Lt knee flexion with slider    TherActivity (to improve standing, walking, transfers, stairs) Leg press double leg 75 lbs 3x10 in available range Leg press single leg 25 lbs Lt leg 2 x 15 in available range Squats 2x10 Lean ins on 6 inch step  Step up and over on 6 inch  Vaso at 36 degrees medium compression   05/10/24 Therex: Rec bike  seat 6/7 5 min  1/2 circles no resistance Incline gastroc 30 sec x 3 bialteral Supine SAQ  10x no weight 10x 2# Seated Lt knee flexion with slider    TherActivity (to improve standing, walking, transfers, stairs) Leg press double leg 75 lbs 2x 15 in available range Leg press single leg 25 lbs Lt leg 2 x 15 in available range Sit to stand to sit 20.5 inch table 2x 8 no UE assist - cues to avoid shifting weight to Rt leg.  Squats 2x10 Lean ins on 6 inch step  Step up and over on 6 inch  Manual   05/05/24 Therex: Nustep level 4 8 min Incline gastroc 30 sec x 3 bialteral Supine SAQ  10x no weight 10x 2# Seated Lt knee flexion with slider    TherActivity (to improve standing, walking,  transfers, stairs) Leg press double leg 75 lbs 2x 15 in available range Leg press single leg 25 lbs Lt leg 2 x 15 in available range Sit to stand to sit 20.5 inch table 2x 8 no UE assist - cues to avoid shifting weight to Rt leg.    Manual Lt knee PROM  05/03/2024 Therex: Recumbent bike seat 6 with partial circles stretching for ROM 7 mins.  Incline gastroc 30 sec x 3 bialteral Seated Lt leg LAQ with end range pauses 2.5 lbs 2 x 15 Seated Lt knee flexion green band x 15  TherActivity (to improve standing, walking, transfers, stairs) Leg press double leg 75 lbs x 15 in available range Leg press single leg 25 lbs Lt leg 2 x 15 in available range Sit to stand to sit 20.5 inch table x 10 no UE assist - cues to avoid shifting weight to Rt leg.    Manual Lt knee flexion c distraction/IR mobilization c movement    TREATMENT         DATE: 04/29/2024 Sci Fit Bike Seat 8 AAROM to full range 5 minutes Recumbent bike Seat 5 for 5 minutes AAROM to full range (recommend next visit) Quad sets with heel prop 2 sets of 10 for 5 seconds Supine knee flexion with strap 10 x 5 seconds T[place]gate knee flexion 1 minute  Functional Activities (for sit to stand/stairs): Double Leg Press 75# 15 x slow eccentrics and stretch into flexion Single Leg Press 25# 10 x slow eccentrics and stretch into flexion  Vaso Left Knee 10 minutes Medium 34*   TREATMENT         DATE: 04/27/2024 Therex:   SciFit Bike seat 9 with BLEs & BUEs with rocking flexion stretch ROM, 8 minutes Seated heel slide with foot on wash cloth (reduced resistance) knee extension quad set and active flexion with overpressure by the opposite leg for end  range flexion, 1x10 Supine heel slide with lower leg on ball and strap assistance with knee extension quad set 5 seconds and active assisted knee flexion 5-second hold 10 reps  Therapeutic Activities: Leg press BLEs 87# 10 reps 2 sets;   Tandem stance LLE in front 1st rep & in back 2nd rep  30 sec;  1st set floor eyes open & 2nd set floor eyes closed with intermittent touch sink / chair back.   Manual  PROM with overpressure for knee flexion stretch seated  Vaso 10 mins Lt knee in elevation 34 degrees medium compression   TREATMENT         DATE: 04/22/2024 Therex:   NuStep lvl 5 for ROM, 8 minutes Seated knee extension/flexion with overpressure by the opposite leg, 1x10 LAQ w/ opposite knee flexion, 1x10 Supine 90/90 position allowing gravity to pull knee into flexion, 6x30 seconds Quad sets with heel prop, 2x10  Manual  Rocking Lt LE into flexion and extension   Vaso 10 mins Lt knee in elevation 34 degrees medium compression    PATIENT EDUCATION:  Education details: HEP Person educated: Patient Education method: Programmer, multimedia, Demonstration, Actor cues, and Verbal cues Education comprehension: verbalized understanding, returned demonstration, and verbal cues required  HOME EXERCISE PROGRAM: Access Code: [email or web address] URL: [email or web address] Date: 04/13/2024 Prepared by: Burnard Meth  Exercises - Supine Quad Set  - 2 x daily - 7 x weekly - 2 sets - 10 reps - 3 hold - Supine Active Straight Leg Raise  - 2 x daily - 7 x weekly - 2 sets - 10 reps - Supine Heel Slides  - 2 x daily - 7 x weekly - 2 sets - 10 reps - 4 hold - Mini Squat  - 2 x daily - 7 x weekly - 2 sets - 10 reps - 2 hold - Standing Heel Raise with Support  - 2 x daily - 7 x weekly - 2 sets - 10 reps  ASSESSMENT:  CLINICAL IMPRESSION: Pt has had 10 visits and is progressing in all areas.  STG have been mett. Strength increasing as demonstrated by ability to tolerate increased weight with leg press. She still presents with deficits in ROM, strength, pain control and gait.  She will benefit from continued skilled PT to address these issues and complete LTG. OBJECTIVE IMPAIRMENTS: Abnormal gait, decreased balance, decreased ROM, decreased strength, increased edema, impaired flexibility,  and pain.   ACTIVITY LIMITATIONS: bending, sitting, standing, squatting, sleeping, stairs, transfers, bed mobility, and toileting  PARTICIPATION LIMITATIONS: meal prep, cleaning, driving, shopping, and community activity  PERSONAL FACTORS: Age and Fitness are also affecting patient's functional outcome.   REHAB POTENTIAL: Good  CLINICAL DECISION MAKING: Stable/uncomplicated  EVALUATION COMPLEXITY: Moderate   GOALS: Goals reviewed with patient? Yes  SHORT TERM GOALS: Target date: 05/25/2024   Pt to be independent in HEP. Baseline: Goal status: MET 9/2  2.  Decrease pain by 1-2 levels. Baseline:  Goal status: MET 9/2  3.  Progress to ambulation with SPC  household distances. Baseline:  Goal status: MET 9/2  LONG TERM GOALS: Target date: 07/06/2024    Increase AROM to 0> 110 to allow for improved mobility with sit to stand transfers and ADLs.  Goal status: ongoing, 04/29/2024  2.  Increase LLE strength to at least 4/5 throughout.    Goal status: ongoing, 04/22/2024  3.  Able to ambulate short community distances with [facility] (if needed) independently.  Goal status: ongoing, 04/29/2024  4.  Decrease pain to  max 3/10 with rest and ADLs.  Goal status: ongoing, 04/29/2024  5.  Increase by 2+ levels in PSFS score.  Goal status: MET 9/2   PLAN:  PT FREQUENCY: 2x/week  PT DURATION: 12 weeks  PLANNED INTERVENTIONS: 97164- PT Re-evaluation, 97110-Therapeutic exercises, 97530- Therapeutic activity, 97112- Neuromuscular re-education, 97535- Self Care, [id number]- Manual therapy, [phone]- Gait training, [phone]- Electrical stimulation (unattended), 97016- Vasopneumatic device, Patient/Family education, Balance training, Stair training, Joint mobilization, Scar mobilization, Cryotherapy, and Moist heat  PLAN FOR NEXT SESSION:  Balance challenges, flexion work   [employer], PT 05/18/24  3:19 PM

## 2024-05-18 ENCOUNTER — Ambulatory Visit: Payer: Self-pay

## 2024-05-18 DIAGNOSIS — M25562 Pain in left knee: Secondary | ICD-10-CM | POA: Diagnosis not present

## 2024-05-18 DIAGNOSIS — R262 Difficulty in walking, not elsewhere classified: Secondary | ICD-10-CM

## 2024-05-18 DIAGNOSIS — R600 Localized edema: Secondary | ICD-10-CM | POA: Diagnosis not present

## 2024-05-18 DIAGNOSIS — M25662 Stiffness of left knee, not elsewhere classified: Secondary | ICD-10-CM

## 2024-05-18 DIAGNOSIS — G8929 Other chronic pain: Secondary | ICD-10-CM | POA: Diagnosis not present

## 2024-05-20 ENCOUNTER — Encounter: Admitting: Rehabilitative and Restorative Service Providers"

## 2024-05-21 NOTE — Therapy (Incomplete)
 OUTPATIENT PHYSICAL THERAPY TREATMENT   Patient Name: Cristina Sawyer MRN: [id number] DOB:Mar 01, 1941, 83 y.o., female Today's Date: 05/21/2024  END OF SESSION:***             Past Medical History:  Diagnosis Date   Arthritis    Cataract    Chronic kidney disease    Diverticulosis    Dyslipidemia    GERD (gastroesophageal reflux disease)    HTN (hypertension) 2015   Personal history of colonic adenoma 08/09/2008   Vitamin D  deficiency    Past Surgical History:  Procedure Laterality Date   ABDOMINAL HYSTERECTOMY  1975   APPENDECTOMY  1975   COLONOSCOPY     HAND SURGERY     Cyst removal   THYROID  SURGERY  2000   to remove nodule   TOTAL KNEE ARTHROPLASTY Right 12/23/2022   Procedure: RIGHT TOTAL KNEE ARTHROPLASTY;  Surgeon: Jerri Kay HERO, MD;  Location: [facility] OR;  Service: Orthopedics;  Laterality: Right;   TOTAL KNEE ARTHROPLASTY Left 03/29/2024   Procedure: LEFT TOTAL KNEE ARTHROPLASTY;  Surgeon: Jerri Kay HERO, MD;  Location: [facility] OR;  Service: Orthopedics;  Laterality: Left;   Patient Active Problem List   Diagnosis Date Noted   Status post total left knee replacement 03/29/2024   Primary osteoarthritis of left knee 03/28/2024   Status post total right knee replacement 12/23/2022   History of GI bleed 06/06/2022   Osteopenia 08/30/2021   Aortic atherosclerosis (HCC) 07/12/2021   Diverticulosis 07/12/2021   GAD (generalized anxiety disorder) 07/05/2021   Personal history of calcium  pyrophosphate deposition disease (CPPD) 04/11/2020   History of vitamin D  deficiency 07/26/2019   Age-related cataract of both eyes 05/19/2018   Hyperlipidemia 05/19/2018   CKD (chronic kidney disease) stage 3, GFR 30-59 ml/min (HCC) 02/14/2018   Essential hypertension 05/12/2017   Transient cerebral ischemia 05/12/2017   Seasonal allergic rhinitis due to pollen 12/29/2014   GERD (gastroesophageal reflux disease) 01/17/2014   History of colonic polyps 08/09/2008    PCP: Joyce Norleen BROCKS, MD   REFERRING PROVIDER: Jerri Kay HERO, MD  REFERRING DIAG: [phone] (ICD-10-CM) - Unilateral primary osteoarthritis, left knee  THERAPY DIAG:  No diagnosis found.  Rationale for Evaluation and Treatment: Rehabilitation  ONSET DATE: 03/29/24 surgery Left TKA  SUBJECTIVE:   SUBJECTIVE STATEMENT:   ***Pt states increased activity and sqts at home.  Notices less pain overall.  PERTINENT HISTORY: No previous Lt knee surgery. Left TKA 03/29/24, OA, osteopenia, right TKA 12/23/22  PAIN:   ***NPRS scale: upon arrival today: 4/10 Pain location: Lt knee ant/posteri Pain description: dull, achy Aggravating factors: ambulation, bending Relieving factors: medication, ice, elevation  PRECAUTIONS: None  RED FLAGS: None   WEIGHT BEARING RESTRICTIONS: No  FALLS:  Has patient fallen in last 6 months? No  LIVING ENVIRONMENT: Lives with: lives with their family and lives alone Lives in: House/apartment Stairs: No Has following equipment at home: Single point cane and Environmental consultant - 2 wheeled  OCCUPATION: retired  PLOF: Independent  PATIENT GOALS: Decrease pain with all ADLs  NEXT MD VISIT: 05/11/24  OBJECTIVE:  Note: Objective measures were completed at Evaluation unless otherwise noted.  DIAGNOSTIC FINDINGS: Pre surgery -Imaging-X-rays demonstrate advanced tricompartmental degenerative changes.  No  acute fracture noted.    PATIENT SURVEYS:  PSFS: THE PATIENT SPECIFIC FUNCTIONAL SCALE  Place score of 0-10 (0 = unable to perform activity and 10 = able to perform activity at the same level as before injury or problem)  Activity Date: 04/13/24 05/18/24  driving 6 10   2.sleeping 5 8   3.sit to stand 4 10   4.      Total Score 5 9.33     Total Score = Sum of activity scores/number of activities  Minimally Detectable Change: 3 points (for single activity); 2 points (for average score)   COGNITION: 04/13/2024 Overall cognitive status: Within functional limits for  tasks assessed     SENSATION: 04/13/2024 [facility]  EDEMA:  04/13/2024 Not specifically recorded   MUSCLE LENGTH: 04/13/2024 Hamstrings: Right mild restriction ; Left moderate restriction  POSTURE:  04/13/2024 forward head  PALPATION: 04/13/2024 1+ tenderness at joint line and along incision  LOWER EXTREMITY ROM:   AROM/PROM Left Eval 04/13/2024 Left 04/19/24 Left 04/27/24 Left 04/29/2024 Left  9/25  Hip flexion       Hip extension       Hip abduction       Hip adduction       Hip internal rotation       Hip external rotation       Knee flexion Seated 74/84 Supine  82/85 85 supine AROM heel slide 86* supine AAROM 91* with strap   Knee extension Supine +5/0   Lacks 3*   Ankle dorsiflexion       Ankle plantarflexion       Ankle inversion       Ankle eversion        (Blank rows = not tested)  LOWER EXTREMITY MMT:  MMT Left Eval 04/13/2024  Hip flexion 4-  Hip extension   Hip abduction 4-  Hip adduction   Hip internal rotation   Hip external rotation   Knee flexion 3+  Knee extension 3+  Ankle dorsiflexion 3+  Ankle plantarflexion   Ankle inversion   Ankle eversion    (Blank rows = not tested)  LOWER EXTREMITY SPECIAL TESTS:  04/13/2024 No special tests completed  FUNCTIONAL TESTS:  04/13/2024 5 times sit to stand: unable to complete  GAIT: 04/19/2024:   Ambulation into clinic c SPC in Rt UE.   04/13/2024 Distance walked: 50 feet Assistive device utilized: Single point cane and Walker - 2 wheeled Level of assistance: SBA                     TREATMENT         DATE:  05/24/24***     05/18/24 TherActivity (to improve standing, walking, transfers, stairs) Leg press double leg 81 lbs 3x10 in available range Leg press single leg 25 lbs Lt leg 2 x 15 in available range Squats 2x10 Lean ins on 6 inch step 12x Step up and tap on 6 inch Therex: Rec bike  seat 6/7 5 min  1/2 circles no resistance Incline gastroc 30 sec x 3 bialteral Supine SAQ   10x no weight 10x 2# Seated Lt knee flexion with slider   Vaso at 36 degrees medium compression   05/12/24 Therex: Rec bike  seat 6/7 5 min  1/2 circles no resistance Incline gastroc 30 sec x 3 bialteral Supine SAQ  10x no weight 10x 2# Seated Lt knee flexion with slider    TherActivity (to improve standing, walking, transfers, stairs) Leg press double leg 75 lbs 3x10 in available range Leg press single leg 25 lbs Lt leg 2 x 15 in available range Squats 2x10 Lean ins on 6 inch step  Step up and over on 6 inch  Vaso at 36 degrees medium compression   05/10/24 Therex:  Rec bike  seat 6/7 5 min  1/2 circles no resistance Incline gastroc 30 sec x 3 bialteral Supine SAQ  10x no weight 10x 2# Seated Lt knee flexion with slider    TherActivity (to improve standing, walking, transfers, stairs) Leg press double leg 75 lbs 2x 15 in available range Leg press single leg 25 lbs Lt leg 2 x 15 in available range Sit to stand to sit 20.5 inch table 2x 8 no UE assist - cues to avoid shifting weight to Rt leg.  Squats 2x10 Lean ins on 6 inch step  Step up and over on 6 inch  Manual   05/05/24 Therex: Nustep level 4 8 min Incline gastroc 30 sec x 3 bialteral Supine SAQ  10x no weight 10x 2# Seated Lt knee flexion with slider    TherActivity (to improve standing, walking, transfers, stairs) Leg press double leg 75 lbs 2x 15 in available range Leg press single leg 25 lbs Lt leg 2 x 15 in available range Sit to stand to sit 20.5 inch table 2x 8 no UE assist - cues to avoid shifting weight to Rt leg.    Manual Lt knee PROM  05/03/2024 Therex: Recumbent bike seat 6 with partial circles stretching for ROM 7 mins.  Incline gastroc 30 sec x 3 bialteral Seated Lt leg LAQ with end range pauses 2.5 lbs 2 x 15 Seated Lt knee flexion green band x 15  TherActivity (to improve standing, walking, transfers, stairs) Leg press double leg 75 lbs x 15 in available range Leg press single leg 25  lbs Lt leg 2 x 15 in available range Sit to stand to sit 20.5 inch table x 10 no UE assist - cues to avoid shifting weight to Rt leg.    Manual Lt knee flexion c distraction/IR mobilization c movement    TREATMENT         DATE: 04/29/2024 Sci Fit Bike Seat 8 AAROM to full range 5 minutes Recumbent bike Seat 5 for 5 minutes AAROM to full range (recommend next visit) Quad sets with heel prop 2 sets of 10 for 5 seconds Supine knee flexion with strap 10 x 5 seconds Tailgate knee flexion 1 minute  Functional Activities (for sit to stand/stairs): Double Leg Press 75# 15 x slow eccentrics and stretch into flexion Single Leg Press 25# 10 x slow eccentrics and stretch into flexion  Vaso Left Knee 10 minutes Medium 34*   TREATMENT         DATE: 04/27/2024 Therex:   SciFit Bike seat 9 with BLEs & BUEs with rocking flexion stretch ROM, 8 minutes Seated heel slide with foot on wash cloth (reduced resistance) knee extension quad set and active flexion with overpressure by the opposite leg for end range flexion, 1x10 Supine heel slide with lower leg on ball and strap assistance with knee extension quad set 5 seconds and active assisted knee flexion 5-second hold 10 reps  Therapeutic Activities: Leg press BLEs 87# 10 reps 2 sets;   Tandem stance LLE in front 1st rep & in back 2nd rep 30 sec;  1st set floor eyes open & 2nd set floor eyes closed with intermittent touch sink / chair back.   Manual  PROM with overpressure for knee flexion stretch seated  Vaso 10 mins Lt knee in elevation 34 degrees medium compression   TREATMENT         DATE: 04/22/2024 Therex:   NuStep lvl 5 for ROM, 8 minutes Seated knee  extension/flexion with overpressure by the opposite leg, 1x10 LAQ w/ opposite knee flexion, 1x10 Supine 90/90 position allowing gravity to pull knee into flexion, 6x30 seconds Quad sets with heel prop, 2x10  Manual  Rocking Lt LE into flexion and extension   Vaso 10 mins Lt knee in  elevation 34 degrees medium compression    PATIENT EDUCATION:  Education details: HEP Person educated: Patient Education method: Programmer, multimedia, Demonstration, Actor cues, and Verbal cues Education comprehension: verbalized understanding, returned demonstration, and verbal cues required  HOME EXERCISE PROGRAM: Access Code: [email or web address] URL: [email or web address] Date: 04/13/2024 Prepared by: Burnard Meth  Exercises - Supine Quad Set  - 2 x daily - 7 x weekly - 2 sets - 10 reps - 3 hold - Supine Active Straight Leg Raise  - 2 x daily - 7 x weekly - 2 sets - 10 reps - Supine Heel Slides  - 2 x daily - 7 x weekly - 2 sets - 10 reps - 4 hold - Mini Squat  - 2 x daily - 7 x weekly - 2 sets - 10 reps - 2 hold - Standing Heel Raise with Support  - 2 x daily - 7 x weekly - 2 sets - 10 reps  ASSESSMENT:  CLINICAL IMPRESSION: ***Strength increasing as demonstrated by ability to tolerate increased weight with leg press. All STG completed. OBJECTIVE IMPAIRMENTS: Abnormal gait, decreased balance, decreased ROM, decreased strength, increased edema, impaired flexibility, and pain.   ACTIVITY LIMITATIONS: bending, sitting, standing, squatting, sleeping, stairs, transfers, bed mobility, and toileting  PARTICIPATION LIMITATIONS: meal prep, cleaning, driving, shopping, and community activity  PERSONAL FACTORS: Age and Fitness are also affecting patient's functional outcome.   REHAB POTENTIAL: Good  CLINICAL DECISION MAKING: Stable/uncomplicated  EVALUATION COMPLEXITY: Moderate   GOALS: Goals reviewed with patient? Yes  SHORT TERM GOALS: Target date: 05/25/2024***   Pt to be independent in HEP. Baseline: Goal status: MET 9/2  2.  Decrease pain by 1-2 levels. Baseline:  Goal status: MET 9/2  3.  Progress to ambulation with SPC  household distances. Baseline:  Goal status: MET 9/2  LONG TERM GOALS: Target date: 07/06/2024    Increase AROM to 0> 110 to allow for improved  mobility with sit to stand transfers and ADLs.  Goal status: ongoing, 04/29/2024  2.  Increase LLE strength to at least 4/5 throughout.    Goal status: ongoing, 04/22/2024  3.  Able to ambulate short community distances with [facility] (if needed) independently.  Goal status: ongoing, 04/29/2024  4.  Decrease pain to max 3/10 with rest and ADLs.  Goal status: ongoing, 04/29/2024  5.  Increase by 2+ levels in PSFS score.  Goal status: MET 9/2   PLAN:  PT FREQUENCY: 2x/week  PT DURATION: 12 weeks  PLANNED INTERVENTIONS: 97164- PT Re-evaluation, 97110-Therapeutic exercises, 97530- Therapeutic activity, 97112- Neuromuscular re-education, 97535- Self Care, 02859- Manual therapy, [phone]- Gait training, [phone]- Electrical stimulation (unattended), 97016- Vasopneumatic device, Patient/Family education, Balance training, Stair training, Joint mobilization, Scar mobilization, Cryotherapy, and Moist heat  PLAN FOR NEXT SESSION:  ***Balance challenges, flexion work   [employer], PT 05/21/24  9:21 AM

## 2024-05-24 ENCOUNTER — Encounter: Payer: Self-pay | Admitting: Rehabilitative and Restorative Service Providers"

## 2024-05-24 ENCOUNTER — Ambulatory Visit: Payer: Self-pay | Admitting: Rehabilitative and Restorative Service Providers"

## 2024-05-24 DIAGNOSIS — G8929 Other chronic pain: Secondary | ICD-10-CM | POA: Diagnosis not present

## 2024-05-24 DIAGNOSIS — R600 Localized edema: Secondary | ICD-10-CM

## 2024-05-24 DIAGNOSIS — R262 Difficulty in walking, not elsewhere classified: Secondary | ICD-10-CM

## 2024-05-24 DIAGNOSIS — M25562 Pain in left knee: Secondary | ICD-10-CM | POA: Diagnosis not present

## 2024-05-24 DIAGNOSIS — M25561 Pain in right knee: Secondary | ICD-10-CM

## 2024-05-24 DIAGNOSIS — M25662 Stiffness of left knee, not elsewhere classified: Secondary | ICD-10-CM

## 2024-05-24 NOTE — Therapy (Signed)
 OUTPATIENT PHYSICAL THERAPY TREATMENT   Patient Name: Cristina Sawyer MRN: [id number] DOB:July 06, 1941, 83 y.o., female Today's Date: 05/24/2024  END OF SESSION:  PT End of Session - 05/24/24 1611     Visit Number 11    Number of Visits 17    Date for PT Re-Evaluation 07/06/24    Authorization Type [employer]    Progress Note Due on Visit 20    PT Start Time 1601    PT Stop Time 1640    PT Time Calculation (min) 39 min    Activity Tolerance Patient tolerated treatment well    Behavior During Therapy WFL for tasks assessed/performed                    Past Medical History:  Diagnosis Date   Arthritis    Cataract    Chronic kidney disease    Diverticulosis    Dyslipidemia    GERD (gastroesophageal reflux disease)    HTN (hypertension) 2015   Personal history of colonic adenoma 08/09/2008   Vitamin D  deficiency    Past Surgical History:  Procedure Laterality Date   ABDOMINAL HYSTERECTOMY  1975   APPENDECTOMY  1975   COLONOSCOPY     HAND SURGERY     Cyst removal   THYROID  SURGERY  2000   to remove nodule   TOTAL KNEE ARTHROPLASTY Right 12/23/2022   Procedure: RIGHT TOTAL KNEE ARTHROPLASTY;  Surgeon: Jerri Kay HERO, MD;  Location: [facility] OR;  Service: Orthopedics;  Laterality: Right;   TOTAL KNEE ARTHROPLASTY Left 03/29/2024   Procedure: LEFT TOTAL KNEE ARTHROPLASTY;  Surgeon: Jerri Kay HERO, MD;  Location: [facility] OR;  Service: Orthopedics;  Laterality: Left;   Patient Active Problem List   Diagnosis Date Noted   Status post total left knee replacement 03/29/2024   Primary osteoarthritis of left knee 03/28/2024   Status post total right knee replacement 12/23/2022   History of GI bleed 06/06/2022   Osteopenia 08/30/2021   Aortic atherosclerosis (HCC) 07/12/2021   Diverticulosis 07/12/2021   GAD (generalized anxiety disorder) 07/05/2021   Personal history of calcium  pyrophosphate deposition disease (CPPD) 04/11/2020   History of vitamin D  deficiency 07/26/2019    Age-related cataract of both eyes 05/19/2018   Hyperlipidemia 05/19/2018   CKD (chronic kidney disease) stage 3, GFR 30-59 ml/min (HCC) 02/14/2018   Essential hypertension 05/12/2017   Transient cerebral ischemia 05/12/2017   Seasonal allergic rhinitis due to pollen 12/29/2014   GERD (gastroesophageal reflux disease) 01/17/2014   History of colonic polyps 08/09/2008    PCP: Joyce Norleen BROCKS, MD   REFERRING PROVIDER: Jerri Kay HERO, MD  REFERRING DIAG: [phone] (ICD-10-CM) - Unilateral primary osteoarthritis, left knee  THERAPY DIAG:  Difficulty in walking, not elsewhere classified  Stiffness of left knee, not elsewhere classified  Localized edema  Chronic pain of left knee  Acute pain of right knee  Rationale for Evaluation and Treatment: Rehabilitation  ONSET DATE: 03/29/24 surgery Left TKA  SUBJECTIVE:   SUBJECTIVE STATEMENT:   Pt indicated having a bug last week that led to a cancel.  Reported less moving due to that.    PERTINENT HISTORY: No previous Lt knee surgery. Left TKA 03/29/24, OA, osteopenia, right TKA 12/23/22  PAIN:   NPRS scale: upon arrival today: 2/10 Pain location: Lt knee Pain description: dull, achy Aggravating factors: ambulation, bending Relieving factors: medication, ice, elevation  PRECAUTIONS: None  RED FLAGS: None   WEIGHT BEARING RESTRICTIONS: No  FALLS:  Has patient fallen  in last 6 months? No  LIVING ENVIRONMENT: Lives with: lives with their family and lives alone Lives in: House/apartment Stairs: No Has following equipment at home: Single point cane and Environmental consultant - 2 wheeled  OCCUPATION: retired  PLOF: Independent  PATIENT GOALS: Decrease pain with all ADLs  NEXT MD VISIT: 05/11/24  OBJECTIVE:  Note: Objective measures were completed at Evaluation unless otherwise noted.  DIAGNOSTIC FINDINGS: Pre surgery -Imaging-X-rays demonstrate advanced tricompartmental degenerative changes.  No  acute fracture noted.    PATIENT  SURVEYS:  PSFS: THE PATIENT SPECIFIC FUNCTIONAL SCALE  Place score of 0-10 (0 = unable to perform activity and 10 = able to perform activity at the same level as before injury or problem)  Activity Date: 04/13/24 05/18/24   driving 6 10   2.sleeping 5 8   3.sit to stand 4 10   4.      Total Score 5 9.33     Total Score = Sum of activity scores/number of activities  Minimally Detectable Change: 3 points (for single activity); 2 points (for average score)   COGNITION: 04/13/2024 Overall cognitive status: Within functional limits for tasks assessed     SENSATION: 04/13/2024 [facility]  EDEMA:  04/13/2024 Not specifically recorded   MUSCLE LENGTH: 04/13/2024 Hamstrings: Right mild restriction ; Left moderate restriction  POSTURE:  04/13/2024 forward head  PALPATION: 04/13/2024 1+ tenderness at joint line and along incision  LOWER EXTREMITY ROM:   AROM/PROM Left Eval 04/13/2024 Left 04/19/24 Left 04/27/24 Left 04/29/2024 Left 05/24/2024  Hip flexion       Hip extension       Hip abduction       Hip adduction       Hip internal rotation       Hip external rotation       Knee flexion Seated 74/84 Supine  82/85 85 supine AROM heel slide 86* supine AAROM 91* with strap   Knee extension Supine +5/0   Lacks 3*   Ankle dorsiflexion       Ankle plantarflexion       Ankle inversion       Ankle eversion        (Blank rows = not tested)  LOWER EXTREMITY MMT:  MMT Left Eval 04/13/2024 Left 05/24/2024  Hip flexion 4- 5/5  Hip extension    Hip abduction 4-   Hip adduction    Hip internal rotation    Hip external rotation    Knee flexion 3+ 5/5  Knee extension 3+ 4+/5  Ankle dorsiflexion 3+ 5/5  Ankle plantarflexion    Ankle inversion    Ankle eversion     (Blank rows = not tested)  LOWER EXTREMITY SPECIAL TESTS:  04/13/2024 No special tests completed  FUNCTIONAL TESTS:  04/13/2024 5 times sit to stand: unable to complete  GAIT: 04/19/2024:   Ambulation into  clinic c SPC in Rt UE.   04/13/2024 Distance walked: 50 feet Assistive device utilized: Single point cane and Walker - 2 wheeled Level of assistance: SBA                     TREATMENT         DATE: 05/24/2024 Therex: UBE LE ROM with UE/LE lvl 1 full revolutions, seat 10 6 mins  Incline gastroc stretch 30 sec x 3 bilateral  Seated LAQ with end range pauses each direction 2 x 15 Lt leg  TherActivity (to improve standing, walking, transfers, stairs) Forward step up 6 inch  step with light hand on bar Lt leg up, Rt leg off posteriorly x 10 Step on over and down 4 inch step WB on Lt leg with single hand on bar x 10 with focus on slow lowering   Neuro Re-ed SLS with 6 inch hurdle clear over and back x 10 bilateral with focus on knee flexion over hurdle.  Light single hand pressure on bar moderately frequent.  SBA Lateral step over 6 inch hurdle  x 10 each way with light hand assist on bar.   Manual Seated Lt knee flexion c distraction /IR mobilization c movement.  Contract/relax to quad for Lt knee flexion gains.    TREATMENT         DATE: 05/18/24 TherActivity (to improve standing, walking, transfers, stairs) Leg press double leg 81 lbs 3x10 in available range Leg press single leg 25 lbs Lt leg 2 x 15 in available range Squats 2x10 Lean ins on 6 inch step 12x Step up and tap on 6 inch Therex: Rec bike  seat 6/7 5 min  1/2 circles no resistance Incline gastroc 30 sec x 3 bialteral Supine SAQ  10x no weight 10x 2# Seated Lt knee flexion with slider   Vaso at 36 degrees medium compression   TREATMENT         DATE:05/12/24 Therex: Rec bike  seat 6/7 5 min  1/2 circles no resistance Incline gastroc 30 sec x 3 bialteral Supine SAQ  10x no weight 10x 2# Seated Lt knee flexion with slider    TherActivity (to improve standing, walking, transfers, stairs) Leg press double leg 75 lbs 3x10 in available range Leg press single leg 25 lbs Lt leg 2 x 15 in available range Squats 2x10 Lean  ins on 6 inch step  Step up and over on 6 inch  Vaso at 36 degrees medium compression   TREATMENT         DATE:05/10/24 Therex: Rec bike  seat 6/7 5 min  1/2 circles no resistance Incline gastroc 30 sec x 3 bialteral Supine SAQ  10x no weight 10x 2# Seated Lt knee flexion with slider    TherActivity (to improve standing, walking, transfers, stairs) Leg press double leg 75 lbs 2x 15 in available range Leg press single leg 25 lbs Lt leg 2 x 15 in available range Sit to stand to sit 20.5 inch table 2x 8 no UE assist - cues to avoid shifting weight to Rt leg.  Squats 2x10 Lean ins on 6 inch step  Step up and over on 6 inch    PATIENT EDUCATION:  Education details: HEP Person educated: Patient Education method: Explanation, Demonstration, Tactile cues, and Verbal cues Education comprehension: verbalized understanding, returned demonstration, and verbal cues required  HOME EXERCISE PROGRAM: Access Code: [email or web address] URL: [email or web address] Date: 04/13/2024 Prepared by: Burnard Meth  Exercises - Supine Quad Set  - 2 x daily - 7 x weekly - 2 sets - 10 reps - 3 hold - Supine Active Straight Leg Raise  - 2 x daily - 7 x weekly - 2 sets - 10 reps - Supine Heel Slides  - 2 x daily - 7 x weekly - 2 sets - 10 reps - 4 hold - Mini Squat  - 2 x daily - 7 x weekly - 2 sets - 10 reps - 2 hold - Standing Heel Raise with Support  - 2 x daily - 7 x weekly - 2 sets - 10 reps  ASSESSMENT:  CLINICAL IMPRESSION:  May continue to benefit from flexion mobility gains and quality of movement to improve tranfers, ambulation techniques. Making some progress in clinic with WB control (noted on step activity).  Continued skilled PT services indicated to continue progression.    OBJECTIVE IMPAIRMENTS: Abnormal gait, decreased balance, decreased ROM, decreased strength, increased edema, impaired flexibility, and pain.   ACTIVITY LIMITATIONS: bending, sitting, standing, squatting, sleeping,  stairs, transfers, bed mobility, and toileting  PARTICIPATION LIMITATIONS: meal prep, cleaning, driving, shopping, and community activity  PERSONAL FACTORS: Age and Fitness are also affecting patient's functional outcome.   REHAB POTENTIAL: Good  CLINICAL DECISION MAKING: Stable/uncomplicated  EVALUATION COMPLEXITY: Moderate   GOALS: Goals reviewed with patient? Yes  SHORT TERM GOALS: Target date: 05/25/2024   Pt to be independent in HEP. Baseline: Goal status: MET 9/2  2.  Decrease pain by 1-2 levels. Baseline:  Goal status: MET 9/2  3.  Progress to ambulation with SPC  household distances. Baseline:  Goal status: MET 9/2  LONG TERM GOALS: Target date: 07/06/2024    Increase AROM to 0> 110 to allow for improved mobility with sit to stand transfers and ADLs.  Goal status: on going 05/24/2024  2.  Increase LLE strength to at least 4/5 throughout.    Goal status: on going 05/24/2024  3.  Able to ambulate short community distances with [facility] (if needed) independently.  Goal status: on going 05/24/2024  4.  Decrease pain to max 3/10 with rest and ADLs.  Goal status: on going 05/24/2024  5.  Increase by 2+ levels in PSFS score.  Goal status: MET 9/2   PLAN:  PT FREQUENCY: 2x/week  PT DURATION: 12 weeks  PLANNED INTERVENTIONS: 97164- PT Re-evaluation, 97110-Therapeutic exercises, 97530- Therapeutic activity, 97112- Neuromuscular re-education, 97535- Self Care, 02859- Manual therapy, [phone]- Gait training, [phone]- Electrical stimulation (unattended), 97016- Vasopneumatic device, Patient/Family education, Balance training, Stair training, Joint mobilization, Scar mobilization, Cryotherapy, and Moist heat  PLAN FOR NEXT SESSION:  Mobility gains and strengthening in WB.    Ozell Silvan, PT, DPT, OCS, ATC 05/24/24  4:40 PM

## 2024-05-31 ENCOUNTER — Ambulatory Visit (HOSPITAL_BASED_OUTPATIENT_CLINIC_OR_DEPARTMENT_OTHER)
Admission: RE | Admit: 2024-05-31 | Discharge: 2024-05-31 | Disposition: A | Discharge status: Home / Self Care | Source: Ambulatory Visit | Attending: Family Medicine | Admitting: Family Medicine

## 2024-05-31 ENCOUNTER — Ambulatory Visit: Payer: Self-pay | Admitting: Family Medicine

## 2024-05-31 DIAGNOSIS — Z780 Asymptomatic menopausal state: Secondary | ICD-10-CM | POA: Insufficient documentation

## 2024-05-31 DIAGNOSIS — Z13820 Encounter for screening for osteoporosis: Secondary | ICD-10-CM | POA: Insufficient documentation

## 2024-05-31 DIAGNOSIS — M8589 Other specified disorders of bone density and structure, multiple sites: Secondary | ICD-10-CM | POA: Insufficient documentation

## 2024-05-31 DIAGNOSIS — M8580 Other specified disorders of bone density and structure, unspecified site: Secondary | ICD-10-CM | POA: Insufficient documentation

## 2024-05-31 NOTE — Therapy (Signed)
 OUTPATIENT PHYSICAL THERAPY TREATMENT   Patient Name: Cristina Sawyer MRN: [id number] DOB:1941-07-23, 83 y.o., female Today's Date: 06/01/2024  END OF SESSION:  PT End of Session - 06/01/24 1357     Visit Number 12    Number of Visits 17    Date for PT Re-Evaluation 07/06/24    Authorization Type [employer]    Progress Note Due on Visit 20    PT Start Time 1345    PT Stop Time 1425    PT Time Calculation (min) 40 min    Activity Tolerance Patient tolerated treatment well    Behavior During Therapy [facility] for tasks assessed/performed                     Past Medical History:  Diagnosis Date   Arthritis    Cataract    Chronic kidney disease    Diverticulosis    Dyslipidemia    GERD (gastroesophageal reflux disease)    HTN (hypertension) 2015   Personal history of colonic adenoma 08/09/2008   Vitamin D  deficiency    Past Surgical History:  Procedure Laterality Date   ABDOMINAL HYSTERECTOMY  1975   APPENDECTOMY  1975   COLONOSCOPY     HAND SURGERY     Cyst removal   THYROID  SURGERY  2000   to remove nodule   TOTAL KNEE ARTHROPLASTY Right 12/23/2022   Procedure: RIGHT TOTAL KNEE ARTHROPLASTY;  Surgeon: Jerri Kay HERO, MD;  Location: [facility] OR;  Service: Orthopedics;  Laterality: Right;   TOTAL KNEE ARTHROPLASTY Left 03/29/2024   Procedure: LEFT TOTAL KNEE ARTHROPLASTY;  Surgeon: Jerri Kay HERO, MD;  Location: [facility] OR;  Service: Orthopedics;  Laterality: Left;   Patient Active Problem List   Diagnosis Date Noted   Status post total left knee replacement 03/29/2024   Primary osteoarthritis of left knee 03/28/2024   Status post total right knee replacement 12/23/2022   History of GI bleed 06/06/2022   Osteopenia 08/30/2021   Aortic atherosclerosis (HCC) 07/12/2021   Diverticulosis 07/12/2021   GAD (generalized anxiety disorder) 07/05/2021   Personal history of calcium  pyrophosphate deposition disease (CPPD) 04/11/2020   History of vitamin D  deficiency  07/26/2019   Age-related cataract of both eyes 05/19/2018   Hyperlipidemia 05/19/2018   CKD (chronic kidney disease) stage 3, GFR 30-59 ml/min (HCC) 02/14/2018   Essential hypertension 05/12/2017   Transient cerebral ischemia 05/12/2017   Seasonal allergic rhinitis due to pollen 12/29/2014   GERD (gastroesophageal reflux disease) 01/17/2014   History of colonic polyps 08/09/2008    PCP: Joyce Norleen BROCKS, MD   REFERRING PROVIDER: Jerri Kay HERO, MD  REFERRING DIAG: [phone] (ICD-10-CM) - Unilateral primary osteoarthritis, left knee  THERAPY DIAG:  Difficulty in walking, not elsewhere classified  Stiffness of left knee, not elsewhere classified  Localized edema  Chronic pain of left knee  Rationale for Evaluation and Treatment: Rehabilitation  ONSET DATE: 03/29/24 surgery Left TKA  SUBJECTIVE:   SUBJECTIVE STATEMENT:   Feeling good with more motion at home.  PERTINENT HISTORY: No previous Lt knee surgery. Left TKA 03/29/24, OA, osteopenia, right TKA 12/23/22  PAIN:   NPRS scale: upon arrival today: 1/10 Pain location: Lt knee Pain description: dull, achy Aggravating factors: ambulation, bending Relieving factors: medication, ice, elevation  PRECAUTIONS: None  RED FLAGS: None   WEIGHT BEARING RESTRICTIONS: No  FALLS:  Has patient fallen in last 6 months? No  LIVING ENVIRONMENT: Lives with: lives with their family and lives alone Lives in:  House/apartment Stairs: No Has following equipment at home: Single point cane and Walker - 2 wheeled  OCCUPATION: retired  PLOF: Independent  PATIENT GOALS: Decrease pain with all ADLs  NEXT MD VISIT: 05/11/24  OBJECTIVE:  Note: Objective measures were completed at Evaluation unless otherwise noted.  DIAGNOSTIC FINDINGS: Pre surgery -Imaging-X-rays demonstrate advanced tricompartmental degenerative changes.  No  acute fracture noted.    PATIENT SURVEYS:  PSFS: THE PATIENT SPECIFIC FUNCTIONAL SCALE  Place score of  0-10 (0 = unable to perform activity and 10 = able to perform activity at the same level as before injury or problem)  Activity Date: 04/13/24 05/18/24   driving 6 10   2.sleeping 5 8   3.sit to stand 4 10   4.      Total Score 5 9.33     Total Score = Sum of activity scores/number of activities  Minimally Detectable Change: 3 points (for single activity); 2 points (for average score)   COGNITION: 04/13/2024 Overall cognitive status: Within functional limits for tasks assessed     SENSATION: 04/13/2024 [facility]  EDEMA:  04/13/2024 Not specifically recorded   MUSCLE LENGTH: 04/13/2024 Hamstrings: Right mild restriction ; Left moderate restriction  POSTURE:  04/13/2024 forward head  PALPATION: 04/13/2024 1+ tenderness at joint line and along incision  LOWER EXTREMITY ROM:   AROM/PROM Left Eval 04/13/2024 Left 04/19/24 Left 04/27/24 Left 04/29/2024 Left 06/01/2024  Hip flexion       Hip extension       Hip abduction       Hip adduction       Hip internal rotation       Hip external rotation       Knee flexion Seated 74/84 Supine  82/85 85 supine AROM heel slide 86* supine AAROM 91* with strap   Knee extension Supine +5/0   Lacks 3*   Ankle dorsiflexion       Ankle plantarflexion       Ankle inversion       Ankle eversion        (Blank rows = not tested)  LOWER EXTREMITY MMT:  MMT Left Eval 04/13/2024 Left 05/24/2024  Hip flexion 4- 5/5  Hip extension    Hip abduction 4-   Hip adduction    Hip internal rotation    Hip external rotation    Knee flexion 3+ 5/5  Knee extension 3+ 4+/5  Ankle dorsiflexion 3+ 5/5  Ankle plantarflexion    Ankle inversion    Ankle eversion     (Blank rows = not tested)  LOWER EXTREMITY SPECIAL TESTS:  04/13/2024 No special tests completed  FUNCTIONAL TESTS:  04/13/2024 5 times sit to stand: unable to complete  GAIT: 04/19/2024:   Ambulation into clinic c SPC in Rt UE.   04/13/2024 Distance walked: 50  feet Assistive device utilized: Single point cane and Walker - 2 wheeled Level of assistance: SBA                     TREATMENT         DATE:  06/01/24 Therex: UBE LE ROM with UE/LE lvl 1 full revolutions, seat 10 6 mins  Incline gastroc stretch 30 sec x 3 bilateral  Seated LAQ with end range pauses each direction 2 x 15 Lt leg Leg Press 87# 3x10  unilateral 31# 2x10 TherActivity (to improve standing, walking, transfers, stairs) Forward step up 6 inch step 2x10 side step ups 2x10 and step up/overs  2x10  Neuro Re-ed Heel to toe balance    05/24/2024 Therex: UBE LE ROM with UE/LE lvl 1 full revolutions, seat 10 7 mins  Incline gastroc stretch 30 sec x 3 bilateral  Seated LAQ with end range pauses each direction 2 x 15 Lt leg  TherActivity (to improve standing, walking, transfers, stairs) Forward step up 6 inch step with light hand on bar Lt leg up, Rt leg off posteriorly x 10 Step on over and down 4 inch step WB on Lt leg with single hand on bar x 10 with focus on slow lowering   Neuro Re-ed SLS with 6 inch hurdle clear over and back x 10 bilateral with focus on knee flexion over hurdle.  Light single hand pressure on bar moderately frequent.  SBA Lateral step over 6 inch hurdle  x 10 each way with light hand assist on bar.   Manual Seated Lt knee flexion c distraction /IR mobilization c movement.  Contract/relax to quad for Lt knee flexion gains.    TREATMENT         DATE: 05/18/24 TherActivity (to improve standing, walking, transfers, stairs) Leg press double leg 81 lbs 3x10 in available range Leg press single leg 25 lbs Lt leg 2 x 15 in available range Squats 2x10 Lean ins on 6 inch step 12x Step up and tap on 6 inch Therex: Rec bike  seat 6/7 5 min  1/2 circles no resistance Incline gastroc 30 sec x 3 bialteral Supine SAQ  10x no weight 10x 2# Seated Lt knee flexion with slider   Vaso at 36 degrees medium compression   TREATMENT         DATE:05/12/24 Therex: Rec  bike  seat 6/7 5 min  1/2 circles no resistance Incline gastroc 30 sec x 3 bialteral Supine SAQ  10x no weight 10x 2# Seated Lt knee flexion with slider    TherActivity (to improve standing, walking, transfers, stairs) Leg press double leg 75 lbs 3x10 in available range Leg press single leg 25 lbs Lt leg 2 x 15 in available range Squats 2x10 Lean ins on 6 inch step  Step up and over on 6 inch  Vaso at 36 degrees medium compression   TREATMENT         DATE:05/10/24 Therex: Rec bike  seat 6/7 5 min  1/2 circles no resistance Incline gastroc 30 sec x 3 bialteral Supine SAQ  10x no weight 10x 2# Seated Lt knee flexion with slider    TherActivity (to improve standing, walking, transfers, stairs) Leg press double leg 75 lbs 2x 15 in available range Leg press single leg 25 lbs Lt leg 2 x 15 in available range Sit to stand to sit 20.5 inch table 2x 8 no UE assist - cues to avoid shifting weight to Rt leg.  Squats 2x10 Lean ins on 6 inch step  Step up and over on 6 inch    PATIENT EDUCATION:  Education details: HEP Person educated: Patient Education method: Explanation, Demonstration, Tactile cues, and Verbal cues Education comprehension: verbalized understanding, returned demonstration, and verbal cues required  HOME EXERCISE PROGRAM: Access Code: [email or web address] URL: [email or web address] Date: 04/13/2024 Prepared by: Burnard Meth  Exercises - Supine Quad Set  - 2 x daily - 7 x weekly - 2 sets - 10 reps - 3 hold - Supine Active Straight Leg Raise  - 2 x daily - 7 x weekly - 2 sets - 10 reps - Supine Heel Slides  - 2 x daily -  7 x weekly - 2 sets - 10 reps - 4 hold - Mini Squat  - 2 x daily - 7 x weekly - 2 sets - 10 reps - 2 hold - Standing Heel Raise with Support  - 2 x daily - 7 x weekly - 2 sets - 10 reps  ASSESSMENT:  CLINICAL IMPRESSION:  Improved knee and hip flexion with step ups with VC.   OBJECTIVE IMPAIRMENTS: Abnormal gait, decreased balance,  decreased ROM, decreased strength, increased edema, impaired flexibility, and pain.   ACTIVITY LIMITATIONS: bending, sitting, standing, squatting, sleeping, stairs, transfers, bed mobility, and toileting  PARTICIPATION LIMITATIONS: meal prep, cleaning, driving, shopping, and community activity  PERSONAL FACTORS: Age and Fitness are also affecting patient's functional outcome.   REHAB POTENTIAL: Good  CLINICAL DECISION MAKING: Stable/uncomplicated  EVALUATION COMPLEXITY: Moderate   GOALS: Goals reviewed with patient? Yes  SHORT TERM GOALS: Target date: 05/25/2024   Pt to be independent in HEP. Baseline: Goal status: MET 9/2  2.  Decrease pain by 1-2 levels. Baseline:  Goal status: MET 9/2  3.  Progress to ambulation with SPC  household distances. Baseline:  Goal status: MET 9/2  LONG TERM GOALS: Target date: 07/06/2024    Increase AROM to 0> 110 to allow for improved mobility with sit to stand transfers and ADLs.  Goal status: on going 05/24/2024  2.  Increase LLE strength to at least 4/5 throughout.    Goal status: on going 05/24/2024  3.  Able to ambulate short community distances with [facility] (if needed) independently.  Goal status: on going 05/24/2024  4.  Decrease pain to max 3/10 with rest and ADLs.  Goal status: on going 05/24/2024  5.  Increase by 2+ levels in PSFS score.  Goal status: MET 9/2   PLAN:  PT FREQUENCY: 2x/week  PT DURATION: 12 weeks  PLANNED INTERVENTIONS: 97164- PT Re-evaluation, 97110-Therapeutic exercises, 97530- Therapeutic activity, 97112- Neuromuscular re-education, 97535- Self Care, 02859- Manual therapy, [phone]- Gait training, [phone]- Electrical stimulation (unattended), 97016- Vasopneumatic device, Patient/Family education, Balance training, Stair training, Joint mobilization, Scar mobilization, Cryotherapy, and Moist heat  PLAN FOR NEXT SESSION:  Mobility gains and strengthening in WB.    Burnard Meth, PT 06/01/24  2:35 PM

## 2024-05-31 NOTE — Progress Notes (Signed)
 Was already listed.

## 2024-06-01 ENCOUNTER — Ambulatory Visit

## 2024-06-01 DIAGNOSIS — G8929 Other chronic pain: Secondary | ICD-10-CM

## 2024-06-01 DIAGNOSIS — R600 Localized edema: Secondary | ICD-10-CM | POA: Diagnosis not present

## 2024-06-01 DIAGNOSIS — R262 Difficulty in walking, not elsewhere classified: Secondary | ICD-10-CM | POA: Diagnosis not present

## 2024-06-01 DIAGNOSIS — M25562 Pain in left knee: Secondary | ICD-10-CM | POA: Diagnosis not present

## 2024-06-01 DIAGNOSIS — M25662 Stiffness of left knee, not elsewhere classified: Secondary | ICD-10-CM | POA: Diagnosis not present

## 2024-06-02 NOTE — Therapy (Signed)
 OUTPATIENT PHYSICAL THERAPY TREATMENT   Patient Name: Cristina Sawyer MRN: [id number] DOB:04/20/1941, 83 y.o., female Today's Date: 06/03/2024  END OF SESSION:  PT End of Session - 06/03/24 1153     Visit Number 13    Number of Visits 17    Date for Recertification  07/06/24    Authorization Type [employer] Medicare    Progress Note Due on Visit 20    PT Start Time 1150    PT Stop Time 1229    PT Time Calculation (min) 39 min    Activity Tolerance Patient tolerated treatment well    Behavior During Therapy [facility] for tasks assessed/performed              Past Medical History:  Diagnosis Date   Arthritis    Cataract    Chronic kidney disease    Diverticulosis    Dyslipidemia    GERD (gastroesophageal reflux disease)    HTN (hypertension) 2015   Personal history of colonic adenoma 08/09/2008   Vitamin D  deficiency    Past Surgical History:  Procedure Laterality Date   ABDOMINAL HYSTERECTOMY  1975   APPENDECTOMY  1975   COLONOSCOPY     HAND SURGERY     Cyst removal   THYROID  SURGERY  2000   to remove nodule   TOTAL KNEE ARTHROPLASTY Right 12/23/2022   Procedure: RIGHT TOTAL KNEE ARTHROPLASTY;  Surgeon: Jerri Kay HERO, MD;  Location: [facility] OR;  Service: Orthopedics;  Laterality: Right;   TOTAL KNEE ARTHROPLASTY Left 03/29/2024   Procedure: LEFT TOTAL KNEE ARTHROPLASTY;  Surgeon: Jerri Kay HERO, MD;  Location: [facility] OR;  Service: Orthopedics;  Laterality: Left;   Patient Active Problem List   Diagnosis Date Noted   Status post total left knee replacement 03/29/2024   Primary osteoarthritis of left knee 03/28/2024   Status post total right knee replacement 12/23/2022   History of GI bleed 06/06/2022   Osteopenia 08/30/2021   Aortic atherosclerosis (HCC) 07/12/2021   Diverticulosis 07/12/2021   GAD (generalized anxiety disorder) 07/05/2021   Personal history of calcium  pyrophosphate deposition disease (CPPD) 04/11/2020   History of vitamin D  deficiency 07/26/2019    Age-related cataract of both eyes 05/19/2018   Hyperlipidemia 05/19/2018   CKD (chronic kidney disease) stage 3, GFR 30-59 ml/min (HCC) 02/14/2018   Essential hypertension 05/12/2017   Transient cerebral ischemia 05/12/2017   Seasonal allergic rhinitis due to pollen 12/29/2014   GERD (gastroesophageal reflux disease) 01/17/2014   History of colonic polyps 08/09/2008    PCP: Joyce Norleen BROCKS, MD   REFERRING PROVIDER: Jerri Kay HERO, MD  REFERRING DIAG: [phone] (ICD-10-CM) - Unilateral primary osteoarthritis, left knee  THERAPY DIAG:  Stiffness of left knee, not elsewhere classified  Difficulty in walking, not elsewhere classified  Localized edema  Chronic pain of left knee  Rationale for Evaluation and Treatment: Rehabilitation  ONSET DATE: 03/29/24 surgery Left TKA  SUBJECTIVE:   SUBJECTIVE STATEMENT:   Patient reporting soreness about 1/10 this morning, though says I can see the light.   PERTINENT HISTORY: No previous Lt knee surgery. Left TKA 03/29/24, OA, osteopenia, right TKA 12/23/22  PAIN:   NPRS scale: upon arrival today: 1/10 Pain location: Lt knee Pain description: dull, achy Aggravating factors: ambulation, bending Relieving factors: medication, ice, elevation  PRECAUTIONS: None  RED FLAGS: None   WEIGHT BEARING RESTRICTIONS: No  FALLS:  Has patient fallen in last 6 months? No  LIVING ENVIRONMENT: Lives with: lives with their family and lives alone Lives  in: House/apartment Stairs: No Has following equipment at home: Single point cane and Walker - 2 wheeled  OCCUPATION: retired  PLOF: Independent  PATIENT GOALS: Decrease pain with all ADLs  NEXT MD VISIT: 05/11/24  OBJECTIVE:  Note: Objective measures were completed at Evaluation unless otherwise noted.  DIAGNOSTIC FINDINGS: Pre surgery -Imaging-X-rays demonstrate advanced tricompartmental degenerative changes.  No  acute fracture noted.    PATIENT SURVEYS:  PSFS: THE PATIENT SPECIFIC  FUNCTIONAL SCALE  Place score of 0-10 (0 = unable to perform activity and 10 = able to perform activity at the same level as before injury or problem)  Activity Date: 04/13/24 05/18/24   driving 6 10   2.sleeping 5 8   3.sit to stand 4 10   4.      Total Score 5 9.33     Total Score = Sum of activity scores/number of activities  Minimally Detectable Change: 3 points (for single activity); 2 points (for average score)   COGNITION: 04/13/2024 Overall cognitive status: Within functional limits for tasks assessed     SENSATION: 04/13/2024 [facility]  EDEMA:  04/13/2024 Not specifically recorded   MUSCLE LENGTH: 04/13/2024 Hamstrings: Right mild restriction ; Left moderate restriction  POSTURE:  04/13/2024 forward head  PALPATION: 04/13/2024 1+ tenderness at joint line and along incision  LOWER EXTREMITY ROM:   AROM/PROM Left Eval 04/13/2024 Left 04/19/24 Left 04/27/24 Left 04/29/2024 Left 06/01/2024  Hip flexion       Hip extension       Hip abduction       Hip adduction       Hip internal rotation       Hip external rotation       Knee flexion Seated 74/84 Supine  82/85 85 supine AROM heel slide 86* supine AAROM 91* with strap   Knee extension Supine +5/0   Lacks 3*   Ankle dorsiflexion       Ankle plantarflexion       Ankle inversion       Ankle eversion        (Blank rows = not tested)  LOWER EXTREMITY MMT:  MMT Left Eval 04/13/2024 Left 05/24/2024  Hip flexion 4- 5/5  Hip extension    Hip abduction 4-   Hip adduction    Hip internal rotation    Hip external rotation    Knee flexion 3+ 5/5  Knee extension 3+ 4+/5  Ankle dorsiflexion 3+ 5/5  Ankle plantarflexion    Ankle inversion    Ankle eversion     (Blank rows = not tested)  LOWER EXTREMITY SPECIAL TESTS:  04/13/2024 No special tests completed  FUNCTIONAL TESTS:  04/13/2024 5 times sit to stand: unable to complete  GAIT: 04/19/2024:   Ambulation into clinic c SPC in Rt UE.    04/13/2024 Distance walked: 50 feet Assistive device utilized: Single point cane and Walker - 2 wheeled Level of assistance: SBA                     TREATMENT         DATE:  06/03/2024 TherEx:  UBE with bilat UE and LE level 1.5 with full revolutions for 6 minutes  Slant board gastroc stretch 3x45s  PT discussing HEP, return to regular exercise, and appropriate exercise on recovery days   TherAct:  Bilat leg press 2x12 with 87#  Lt LE leg press 2x12 with 31#  Neuro Re-Ed:  Lateral walks on long airex x6 down and back  with intermittent posterior and anterior LOB, patient able to self-correct with UE use on parallel bars  Step over small hurdle 1x10 each LE to challenge motor control  Step over tall hurdle 1x6 each LE to challenge motor control    06/01/24 Therex: UBE LE ROM with UE/LE lvl 1 full revolutions, seat 10 6 mins  Incline gastroc stretch 30 sec x 3 bilateral  Seated LAQ with end range pauses each direction 2 x 15 Lt leg Leg Press 87# 3x10  unilateral 31# 2x10 TherActivity (to improve standing, walking, transfers, stairs) Forward step up 6 inch step 2x10 side step ups 2x10 and step up/overs  2x10 Neuro Re-ed Heel to toe balance    05/24/2024 Therex: UBE LE ROM with UE/LE lvl 1 full revolutions, seat 10 7 mins  Incline gastroc stretch 30 sec x 3 bilateral  Seated LAQ with end range pauses each direction 2 x 15 Lt leg  TherActivity (to improve standing, walking, transfers, stairs) Forward step up 6 inch step with light hand on bar Lt leg up, Rt leg off posteriorly x 10 Step on over and down 4 inch step WB on Lt leg with single hand on bar x 10 with focus on slow lowering   Neuro Re-ed SLS with 6 inch hurdle clear over and back x 10 bilateral with focus on knee flexion over hurdle.  Light single hand pressure on bar moderately frequent.  SBA Lateral step over 6 inch hurdle  x 10 each way with light hand assist on bar.   Manual Seated Lt knee flexion c  distraction /IR mobilization c movement.  Contract/relax to quad for Lt knee flexion gains.    TREATMENT         DATE: 05/18/24 TherActivity (to improve standing, walking, transfers, stairs) Leg press double leg 81 lbs 3x10 in available range Leg press single leg 25 lbs Lt leg 2 x 15 in available range Squats 2x10 Lean ins on 6 inch step 12x Step up and tap on 6 inch Therex: Rec bike  seat 6/7 5 min  1/2 circles no resistance Incline gastroc 30 sec x 3 bialteral Supine SAQ  10x no weight 10x 2# Seated Lt knee flexion with slider   Vaso at 36 degrees medium compression     PATIENT EDUCATION:  Education details: HEP Person educated: Patient Education method: Programmer, multimedia, Facilities manager, Actor cues, and Verbal cues Education comprehension: verbalized understanding, returned demonstration, and verbal cues required  HOME EXERCISE PROGRAM: Access Code: [email or web address] URL: [email or web address] Date: 04/13/2024 Prepared by: Burnard Meth  Exercises - Supine Quad Set  - 2 x daily - 7 x weekly - 2 sets - 10 reps - 3 hold - Supine Active Straight Leg Raise  - 2 x daily - 7 x weekly - 2 sets - 10 reps - Supine Heel Slides  - 2 x daily - 7 x weekly - 2 sets - 10 reps - 4 hold - Mini Squat  - 2 x daily - 7 x weekly - 2 sets - 10 reps - 2 hold - Standing Heel Raise with Support  - 2 x daily - 7 x weekly - 2 sets - 10 reps  ASSESSMENT:  CLINICAL IMPRESSION:  Patient arrived to session noting minimal soreness and difficulty with Lt knee. Patient tolerated all activities this date with only minimal LOB which she was able to self correct. Patient has changed to 1x/wk prior to MD follow-up. Patient will continue to benefit from skilled PT.   OBJECTIVE IMPAIRMENTS:  Abnormal gait, decreased balance, decreased ROM, decreased strength, increased edema, impaired flexibility, and pain.   ACTIVITY LIMITATIONS: bending, sitting, standing, squatting, sleeping, stairs, transfers, bed mobility,  and toileting  PARTICIPATION LIMITATIONS: meal prep, cleaning, driving, shopping, and community activity  PERSONAL FACTORS: Age and Fitness are also affecting patient's functional outcome.   REHAB POTENTIAL: Good  CLINICAL DECISION MAKING: Stable/uncomplicated  EVALUATION COMPLEXITY: Moderate   GOALS: Goals reviewed with patient? Yes  SHORT TERM GOALS: Target date: 05/25/2024   Pt to be independent in HEP. Baseline: Goal status: MET 9/2  2.  Decrease pain by 1-2 levels. Baseline:  Goal status: MET 9/2  3.  Progress to ambulation with SPC  household distances. Baseline:  Goal status: MET 9/2  LONG TERM GOALS: Target date: 07/06/2024    Increase AROM to 0> 110 to allow for improved mobility with sit to stand transfers and ADLs.  Goal status: on going 05/24/2024  2.  Increase LLE strength to at least 4/5 throughout.    Goal status: on going 05/24/2024  3.  Able to ambulate short community distances with [facility] (if needed) independently.  Goal status: on going 05/24/2024  4.  Decrease pain to max 3/10 with rest and ADLs.  Goal status: on going 05/24/2024  5.  Increase by 2+ levels in PSFS score.  Goal status: MET 9/2   PLAN:  PT FREQUENCY: 2x/week  PT DURATION: 12 weeks  PLANNED INTERVENTIONS: 97164- PT Re-evaluation, 97110-Therapeutic exercises, 97530- Therapeutic activity, 97112- Neuromuscular re-education, 97535- Self Care, 02859- Manual therapy, [phone]- Gait training, [phone]- Electrical stimulation (unattended), 97016- Vasopneumatic device, Patient/Family education, Balance training, Stair training, Joint mobilization, Scar mobilization, Cryotherapy, and Moist heat  PLAN FOR NEXT SESSION:  Mobility gains and strengthening in WB.    Susannah Daring, PT, DPT 06/03/24 12:47 PM

## 2024-06-03 ENCOUNTER — Ambulatory Visit

## 2024-06-03 DIAGNOSIS — M25662 Stiffness of left knee, not elsewhere classified: Secondary | ICD-10-CM

## 2024-06-03 DIAGNOSIS — R262 Difficulty in walking, not elsewhere classified: Secondary | ICD-10-CM | POA: Diagnosis not present

## 2024-06-03 DIAGNOSIS — R600 Localized edema: Secondary | ICD-10-CM | POA: Diagnosis not present

## 2024-06-03 DIAGNOSIS — M25562 Pain in left knee: Secondary | ICD-10-CM

## 2024-06-03 DIAGNOSIS — G8929 Other chronic pain: Secondary | ICD-10-CM | POA: Diagnosis not present

## 2024-06-07 NOTE — Therapy (Signed)
 OUTPATIENT PHYSICAL THERAPY TREATMENT   Patient Name: Cristina Sawyer MRN: [id number] DOB:[birth year], 83 y.o., female Today's Date: 06/08/2024  END OF SESSION:  PT End of Session - 06/08/24 1429     Visit Number 14    Number of Visits 17    Date for Recertification  07/06/24    Authorization Type [employer]    Progress Note Due on Visit 20    PT Start Time 1345    PT Stop Time 1424    PT Time Calculation (min) 39 min    Activity Tolerance Patient tolerated treatment well    Behavior During Therapy [facility] for tasks assessed/performed               Past Medical History:  Diagnosis Date   Arthritis    Cataract    Chronic kidney disease    Diverticulosis    Dyslipidemia    GERD (gastroesophageal reflux disease)    HTN (hypertension) 2015   Personal history of colonic adenoma 08/09/2008   Vitamin D  deficiency    Past Surgical History:  Procedure Laterality Date   ABDOMINAL HYSTERECTOMY  1975   APPENDECTOMY  1975   COLONOSCOPY     HAND SURGERY     Cyst removal   THYROID  SURGERY  2000   to remove nodule   TOTAL KNEE ARTHROPLASTY Right 12/23/2022   Procedure: RIGHT TOTAL KNEE ARTHROPLASTY;  Surgeon: Jerri Kay HERO, MD;  Location: [facility] OR;  Service: Orthopedics;  Laterality: Right;   TOTAL KNEE ARTHROPLASTY Left 03/29/2024   Procedure: LEFT TOTAL KNEE ARTHROPLASTY;  Surgeon: Jerri Kay HERO, MD;  Location: [facility] OR;  Service: Orthopedics;  Laterality: Left;   Patient Active Problem List   Diagnosis Date Noted   Status post total left knee replacement 03/29/2024   Primary osteoarthritis of left knee 03/28/2024   Status post total right knee replacement 12/23/2022   History of GI bleed 06/06/2022   Osteopenia 08/30/2021   Aortic atherosclerosis 07/12/2021   Diverticulosis 07/12/2021   GAD (generalized anxiety disorder) 07/05/2021   Personal history of calcium  pyrophosphate deposition disease (CPPD) 04/11/2020   History of vitamin D  deficiency 07/26/2019   Age-related  cataract of both eyes 05/19/2018   Hyperlipidemia 05/19/2018   CKD (chronic kidney disease) stage 3, GFR 30-59 ml/min (HCC) 02/14/2018   Essential hypertension 05/12/2017   Transient cerebral ischemia 05/12/2017   Seasonal allergic rhinitis due to pollen 12/29/2014   GERD (gastroesophageal reflux disease) 01/17/2014   History of colonic polyps 08/09/2008    PCP: Joyce Norleen BROCKS, MD   REFERRING PROVIDER: Jerri Kay HERO, MD  REFERRING DIAG: [phone] (ICD-10-CM) - Unilateral primary osteoarthritis, left knee  THERAPY DIAG:  Stiffness of left knee, not elsewhere classified  Difficulty in walking, not elsewhere classified  Localized edema  Chronic pain of left knee  Rationale for Evaluation and Treatment: Rehabilitation  ONSET DATE: 03/29/24 surgery Left TKA  SUBJECTIVE:   SUBJECTIVE STATEMENT:   Patient reports doing well with activities at home.  PERTINENT HISTORY: No previous Lt knee surgery. Left TKA 03/29/24, OA, osteopenia, right TKA 12/23/22  PAIN:   NPRS scale: upon arrival today: 1/10 Pain location: Lt knee Pain description: dull, achy Aggravating factors: ambulation, bending Relieving factors: medication, ice, elevation  PRECAUTIONS: None  RED FLAGS: None   WEIGHT BEARING RESTRICTIONS: No  FALLS:  Has patient fallen in last 6 months? No  LIVING ENVIRONMENT: Lives with: lives with their family and lives alone Lives in: House/apartment Stairs: No Has following equipment  at home: Single point cane and Walker - 2 wheeled  OCCUPATION: retired  PLOF: Independent  PATIENT GOALS: Decrease pain with all ADLs  NEXT MD VISIT: 05/11/24  OBJECTIVE:  Note: Objective measures were completed at Evaluation unless otherwise noted.  DIAGNOSTIC FINDINGS: Pre surgery -Imaging-X-rays demonstrate advanced tricompartmental degenerative changes.  No  acute fracture noted.    PATIENT SURVEYS:  PSFS: THE PATIENT SPECIFIC FUNCTIONAL SCALE  Place score of 0-10 (0 =  unable to perform activity and 10 = able to perform activity at the same level as before injury or problem)  Activity Date: 04/13/24 05/18/24   driving 6 10   2.sleeping 5 8   3.sit to stand 4 10   4.      Total Score 5 9.33     Total Score = Sum of activity scores/number of activities  Minimally Detectable Change: 3 points (for single activity); 2 points (for average score)   COGNITION: 04/13/2024 Overall cognitive status: Within functional limits for tasks assessed     SENSATION: 04/13/2024 [facility]  EDEMA:  04/13/2024 Not specifically recorded   MUSCLE LENGTH: 04/13/2024 Hamstrings: Right mild restriction ; Left moderate restriction  POSTURE:  04/13/2024 forward head  PALPATION: 04/13/2024 1+ tenderness at joint line and along incision  LOWER EXTREMITY ROM:   AROM/PROM Left Eval 04/13/2024 Left 04/19/24 Left 04/27/24 Left 04/29/2024 Left 06/01/2024  Hip flexion       Hip extension       Hip abduction       Hip adduction       Hip internal rotation       Hip external rotation       Knee flexion Seated 74/84 Supine  82/85 85 supine AROM heel slide 86* supine AAROM 91* with strap   Knee extension Supine +5/0   Lacks 3*   Ankle dorsiflexion       Ankle plantarflexion       Ankle inversion       Ankle eversion        (Blank rows = not tested)  LOWER EXTREMITY MMT:  MMT Left Eval 04/13/2024 Left 05/24/2024  Hip flexion 4- 5/5  Hip extension    Hip abduction 4-   Hip adduction    Hip internal rotation    Hip external rotation    Knee flexion 3+ 5/5  Knee extension 3+ 4+/5  Ankle dorsiflexion 3+ 5/5  Ankle plantarflexion    Ankle inversion    Ankle eversion     (Blank rows = not tested)  LOWER EXTREMITY SPECIAL TESTS:  04/13/2024 No special tests completed  FUNCTIONAL TESTS:  04/13/2024 5 times sit to stand: unable to complete  GAIT: 04/19/2024:   Ambulation into clinic c SPC in Rt UE.   04/13/2024 Distance walked: 50 feet Assistive device  utilized: Single point cane and Walker - 2 wheeled Level of assistance: SBA                     TREATMENT         DATE:  06/08/24 TherEx:  UBE with bilat UE and LE level 1.5 with full revolutions for 7 minutes  Slant board gastroc stretch 3x30s  TherAct:  Bilat leg press 2x12 with 87#  Lt LE leg press 2x12 with 37#  Neuro Re-Ed:  Lateral walks on long airex x6 down and back  Front walks on air ex 4 laps Heel to toe balance on air ex 30 sec 2x Step over small hurdle  6 laps to challenge motor control  Step over tall hurdle 6 laps to challenge motor control      06/03/2024 TherEx:  UBE with bilat UE and LE level 1.5 with full revolutions for 6 minutes  Slant board gastroc stretch 3x45s  PT discussing HEP, return to regular exercise, and appropriate exercise on recovery days   TherAct:  Bilat leg press 2x12 with 87#  Lt LE leg press 2x12 with 31#  Neuro Re-Ed:  Lateral walks on long airex x6 down and back with intermittent posterior and anterior LOB, patient able to self-correct with UE use on parallel bars  Step over small hurdle 1x10 each LE to challenge motor control  Step over tall hurdle 1x6 each LE to challenge motor control    06/01/24 Therex: UBE LE ROM with UE/LE lvl 1 full revolutions, seat 10 6 mins  Incline gastroc stretch 30 sec x 3 bilateral  Seated LAQ with end range pauses each direction 2 x 15 Lt leg Leg Press 87# 3x10  unilateral 31# 2x10 TherActivity (to improve standing, walking, transfers, stairs) Forward step up 6 inch step 2x10 side step ups 2x10 and step up/overs  2x10 Neuro Re-ed Heel to toe balance    05/24/2024 Therex: UBE LE ROM with UE/LE lvl 1 full revolutions, seat 10 7 mins  Incline gastroc stretch 30 sec x 3 bilateral  Seated LAQ with end range pauses each direction 2 x 15 Lt leg  TherActivity (to improve standing, walking, transfers, stairs) Forward step up 6 inch step with light hand on bar Lt leg up, Rt leg off posteriorly x  10 Step on over and down 4 inch step WB on Lt leg with single hand on bar x 10 with focus on slow lowering   Neuro Re-ed SLS with 6 inch hurdle clear over and back x 10 bilateral with focus on knee flexion over hurdle.  Light single hand pressure on bar moderately frequent.  SBA Lateral step over 6 inch hurdle  x 10 each way with light hand assist on bar.   Manual Seated Lt knee flexion c distraction /IR mobilization c movement.  Contract/relax to quad for Lt knee flexion gains.    TREATMENT         DATE: 05/18/24 TherActivity (to improve standing, walking, transfers, stairs) Leg press double leg 81 lbs 3x10 in available range Leg press single leg 25 lbs Lt leg 2 x 15 in available range Squats 2x10 Lean ins on 6 inch step 12x Step up and tap on 6 inch Therex: Rec bike  seat 6/7 5 min  1/2 circles no resistance Incline gastroc 30 sec x 3 bialteral Supine SAQ  10x no weight 10x 2# Seated Lt knee flexion with slider   Vaso at 36 degrees medium compression     PATIENT EDUCATION:  Education details: HEP Person educated: Patient Education method: Programmer, multimedia, Facilities manager, Actor cues, and Verbal cues Education comprehension: verbalized understanding, returned demonstration, and verbal cues required  HOME EXERCISE PROGRAM: Access Code: [email or web address] URL: [email or web address] Date: 04/13/2024 Prepared by: Burnard Meth  Exercises - Supine Quad Set  - 2 x daily - 7 x weekly - 2 sets - 10 reps - 3 hold - Supine Active Straight Leg Raise  - 2 x daily - 7 x weekly - 2 sets - 10 reps - Supine Heel Slides  - 2 x daily - 7 x weekly - 2 sets - 10 reps - 4 hold - Mini Squat  - 2 x daily -  7 x weekly - 2 sets - 10 reps - 2 hold - Standing Heel Raise with Support  - 2 x daily - 7 x weekly - 2 sets - 10 reps  ASSESSMENT:  CLINICAL IMPRESSION:  Patient able to self correct with LOB on hurdles- occasional need to use UE on bars.  OBJECTIVE IMPAIRMENTS: Abnormal gait, decreased  balance, decreased ROM, decreased strength, increased edema, impaired flexibility, and pain.   ACTIVITY LIMITATIONS: bending, sitting, standing, squatting, sleeping, stairs, transfers, bed mobility, and toileting  PARTICIPATION LIMITATIONS: meal prep, cleaning, driving, shopping, and community activity  PERSONAL FACTORS: Age and Fitness are also affecting patient's functional outcome.   REHAB POTENTIAL: Good  CLINICAL DECISION MAKING: Stable/uncomplicated  EVALUATION COMPLEXITY: Moderate   GOALS: Goals reviewed with patient? Yes  SHORT TERM GOALS: Target date: 05/25/2024   Pt to be independent in HEP. Baseline: Goal status: MET 9/2  2.  Decrease pain by 1-2 levels. Baseline:  Goal status: MET 9/2  3.  Progress to ambulation with SPC  household distances. Baseline:  Goal status: MET 9/2  LONG TERM GOALS: Target date: 07/06/2024    Increase AROM to 0> 110 to allow for improved mobility with sit to stand transfers and ADLs.  Goal status: on going 05/24/2024  2.  Increase LLE strength to at least 4/5 throughout.    Goal status: on going 05/24/2024  3.  Able to ambulate short community distances with [facility] (if needed) independently.  Goal status: on going 05/24/2024  4.  Decrease pain to max 3/10 with rest and ADLs.  Goal status: on going 05/24/2024  5.  Increase by 2+ levels in PSFS score.  Goal status: MET 9/2   PLAN:  PT FREQUENCY: 2x/week  PT DURATION: 12 weeks  PLANNED INTERVENTIONS: 97164- PT Re-evaluation, 97110-Therapeutic exercises, 97530- Therapeutic activity, 97112- Neuromuscular re-education, 97535- Self Care, 02859- Manual therapy, [phone]- Gait training, [phone]- Electrical stimulation (unattended), 97016- Vasopneumatic device, Patient/Family education, Balance training, Stair training, Joint mobilization, Scar mobilization, Cryotherapy, and Moist heat  PLAN FOR NEXT SESSION:  Mobility gains and strengthening in WB.   Burnard Meth, PT 06/08/24  2:31 PM

## 2024-06-08 ENCOUNTER — Ambulatory Visit

## 2024-06-08 DIAGNOSIS — M25562 Pain in left knee: Secondary | ICD-10-CM | POA: Diagnosis not present

## 2024-06-08 DIAGNOSIS — R600 Localized edema: Secondary | ICD-10-CM | POA: Diagnosis not present

## 2024-06-08 DIAGNOSIS — G8929 Other chronic pain: Secondary | ICD-10-CM | POA: Diagnosis not present

## 2024-06-08 DIAGNOSIS — R262 Difficulty in walking, not elsewhere classified: Secondary | ICD-10-CM | POA: Diagnosis not present

## 2024-06-08 DIAGNOSIS — M25662 Stiffness of left knee, not elsewhere classified: Secondary | ICD-10-CM | POA: Diagnosis not present

## 2024-06-10 ENCOUNTER — Encounter

## 2024-06-15 ENCOUNTER — Encounter: Payer: Self-pay | Admitting: Rehabilitative and Restorative Service Providers"

## 2024-06-15 ENCOUNTER — Ambulatory Visit: Admitting: Rehabilitative and Restorative Service Providers"

## 2024-06-15 DIAGNOSIS — R262 Difficulty in walking, not elsewhere classified: Secondary | ICD-10-CM

## 2024-06-15 DIAGNOSIS — M25562 Pain in left knee: Secondary | ICD-10-CM

## 2024-06-15 DIAGNOSIS — R600 Localized edema: Secondary | ICD-10-CM

## 2024-06-15 DIAGNOSIS — M25662 Stiffness of left knee, not elsewhere classified: Secondary | ICD-10-CM

## 2024-06-15 DIAGNOSIS — G8929 Other chronic pain: Secondary | ICD-10-CM

## 2024-06-15 NOTE — Therapy (Addendum)
 " OUTPATIENT PHYSICAL THERAPY TREATMENT / PROGRESS NOTE / DISCHARGE   Patient Name: Cristina Sawyer MRN: [id number] DOB:September 23, 1940, 83 y.o., female Today's Date: 06/15/2024  Progress Note Reporting Period 05/18/2024 to 06/15/2024  See note below for Objective Data and Assessment of Progress/Goals.   END OF SESSION:  PT End of Session - 06/15/24 1355     Visit Number 15    Number of Visits 17    Date for Recertification  07/06/24    Authorization Type [employer] Medicare    Progress Note Due on Visit 20    PT Start Time 1348    PT Stop Time 1427    PT Time Calculation (min) 39 min    Activity Tolerance Patient tolerated treatment well    Behavior During Therapy WFL for tasks assessed/performed                Past Medical History:  Diagnosis Date   Arthritis    Cataract    Chronic kidney disease    Diverticulosis    Dyslipidemia    GERD (gastroesophageal reflux disease)    HTN (hypertension) 2015   Personal history of colonic adenoma 08/09/2008   Vitamin D  deficiency    Past Surgical History:  Procedure Laterality Date   ABDOMINAL HYSTERECTOMY  1975   APPENDECTOMY  1975   COLONOSCOPY     HAND SURGERY     Cyst removal   THYROID  SURGERY  2000   to remove nodule   TOTAL KNEE ARTHROPLASTY Right 12/23/2022   Procedure: RIGHT TOTAL KNEE ARTHROPLASTY;  Surgeon: Jerri Kay HERO, MD;  Location: [facility] OR;  Service: Orthopedics;  Laterality: Right;   TOTAL KNEE ARTHROPLASTY Left 03/29/2024   Procedure: LEFT TOTAL KNEE ARTHROPLASTY;  Surgeon: Jerri Kay HERO, MD;  Location: [facility] OR;  Service: Orthopedics;  Laterality: Left;   Patient Active Problem List   Diagnosis Date Noted   Status post total left knee replacement 03/29/2024   Primary osteoarthritis of left knee 03/28/2024   Status post total right knee replacement 12/23/2022   History of GI bleed 06/06/2022   Osteopenia 08/30/2021   Aortic atherosclerosis 07/12/2021   Diverticulosis 07/12/2021   GAD (generalized anxiety  disorder) 07/05/2021   Personal history of calcium  pyrophosphate deposition disease (CPPD) 04/11/2020   History of vitamin D  deficiency 07/26/2019   Age-related cataract of both eyes 05/19/2018   Hyperlipidemia 05/19/2018   CKD (chronic kidney disease) stage 3, GFR 30-59 ml/min (HCC) 02/14/2018   Essential hypertension 05/12/2017   Transient cerebral ischemia 05/12/2017   Seasonal allergic rhinitis due to pollen 12/29/2014   GERD (gastroesophageal reflux disease) 01/17/2014   History of colonic polyps 08/09/2008    PCP: Joyce Norleen BROCKS, MD   REFERRING PROVIDER: Jerri Kay HERO, MD  REFERRING DIAG: [phone] (ICD-10-CM) - Unilateral primary osteoarthritis, left knee  THERAPY DIAG:  Stiffness of left knee, not elsewhere classified  Difficulty in walking, not elsewhere classified  Localized edema  Chronic pain of left knee  Rationale for Evaluation and Treatment: Rehabilitation  ONSET DATE: 03/29/24 surgery Left TKA  SUBJECTIVE:   SUBJECTIVE STATEMENT:   Pt indicated no specific pain upon arrival.  Reported rain has gone right to her bones.  Pt indicated doing better with things at home. Reported overall improvement 90%.  PERTINENT HISTORY: No previous Lt knee surgery. Left TKA 03/29/24, OA, osteopenia, right TKA 12/23/22  PAIN:   NPRS scale: upon arrival today: 1/10 Pain location: Lt knee Pain description: dull, achy Aggravating factors: ambulation, bending Relieving  factors: medication, ice, elevation  PRECAUTIONS: None  RED FLAGS: None   WEIGHT BEARING RESTRICTIONS: No  FALLS:  Has patient fallen in last 6 months? No  LIVING ENVIRONMENT: Lives with: lives with their family and lives alone Lives in: House/apartment Stairs: No Has following equipment at home: Single point cane and Environmental Consultant - 2 wheeled  OCCUPATION: retired  PLOF: Independent  PATIENT GOALS: Decrease pain with all ADLs  NEXT MD VISIT: 05/11/24  OBJECTIVE:  Note: Objective measures were  completed at Evaluation unless otherwise noted.  DIAGNOSTIC FINDINGS: Pre surgery -Imaging-X-rays demonstrate advanced tricompartmental degenerative changes.  No  acute fracture noted.    PATIENT SURVEYS:  PSFS: THE PATIENT SPECIFIC FUNCTIONAL SCALE  Place score of 0-10 (0 = unable to perform activity and 10 = able to perform activity at the same level as before injury or problem)  Activity Date: 04/13/24 05/18/24 06/15/2024  driving 6 10 10   2.sleeping 5 8 8   3.sit to stand 4 10 10   4.      Total Score 5 9.33 9.33    Total Score = Sum of activity scores/number of activities  Minimally Detectable Change: 3 points (for single activity); 2 points (for average score)   COGNITION: 04/13/2024 Overall cognitive status: Within functional limits for tasks assessed     SENSATION: 04/13/2024 [facility]  EDEMA:  04/13/2024 Not specifically recorded   MUSCLE LENGTH: 04/13/2024 Hamstrings: Right mild restriction ; Left moderate restriction  POSTURE:  04/13/2024 forward head  PALPATION: 04/13/2024 1+ tenderness at joint line and along incision  LOWER EXTREMITY ROM:   AROM/PROM Left Eval 04/13/2024 Left 04/19/24 Left 04/27/24 Left 04/29/2024 Left 06/15/2024  Hip flexion       Hip extension       Hip abduction       Hip adduction       Hip internal rotation       Hip external rotation       Knee flexion Seated 74/84 Supine  82/85 85 supine AROM heel slide 86* supine AAROM 91* with strap 100 AROM in supine heel slide   Knee extension Supine +5/0   Lacks 3*   Ankle dorsiflexion       Ankle plantarflexion       Ankle inversion       Ankle eversion        (Blank rows = not tested)  LOWER EXTREMITY MMT:  MMT Left Eval 04/13/2024 Left 05/24/2024 Right 06/15/2024 Left 06/15/2024  Hip flexion 4- 5/5    Hip extension      Hip abduction 4-     Hip adduction      Hip internal rotation      Hip external rotation      Knee flexion 3+ 5/5    Knee extension 3+ 4+/5 5/5 45.9 lbs  5/5 44.4, 39.4 lbs  Ankle dorsiflexion 3+ 5/5    Ankle plantarflexion      Ankle inversion      Ankle eversion       (Blank rows = not tested)  LOWER EXTREMITY SPECIAL TESTS:  04/13/2024 No special tests completed  FUNCTIONAL TESTS:  04/13/2024 5 times sit to stand: unable to complete  GAIT: 06/15/2024: Ambulation independent.   04/19/2024:   Ambulation into clinic c SPC in Rt UE.   04/13/2024 Distance walked: 50 feet Assistive device utilized: Single point cane and Walker - 2 wheeled Level of assistance: SBA  TREATMENT         DATE: 06/15/2024 Therex: UBE UE/LE full revolutions for knee ROM 8 mins lvl 2.0 - seat 10  Seated Lt leg LAQ with end range pause each direction x 15  Supine Lt leg LAQ in 90 deg flexion with 5 sec end range pause into flexion x 10  Supine Lt knee heel slide with strap 10 sec hold x5  Seated quad set with SLR Lt 3 x 5 with slow movement control focus.  Review of HEP with updated printout provided.   TherActivity (to improve standing, walking, transfers, stairs) Sit to stand to sit x 10 without UE assist 18 inch chair - cues for slow lowering focus.  Step on over and down 6 inch step WB on Lt leg x 10 with light hand assist on bar.   TREATMENT         DATE: 06/08/24 TherEx:  UBE with bilat UE and LE level 1.5 with full revolutions for 7 minutes  Slant board gastroc stretch 3x30s  TherAct:  Bilat leg press 2x12 with 87#  Lt LE leg press 2x12 with 37#  Neuro Re-Ed:  Lateral walks on long airex x6 down and back  Front walks on air ex 4 laps Heel to toe balance on air ex 30 sec 2x Step over small hurdle 6 laps to challenge motor control  Step over tall hurdle 6 laps to challenge motor control    TREATMENT         DATE:06/03/2024 TherEx:  UBE with bilat UE and LE level 1.5 with full revolutions for 6 minutes  Slant board gastroc stretch 3x45s  PT discussing HEP, return to regular exercise, and appropriate exercise on  recovery days   TherAct:  Bilat leg press 2x12 with 87#  Lt LE leg press 2x12 with 31#  Neuro Re-Ed:  Lateral walks on long airex x6 down and back with intermittent posterior and anterior LOB, patient able to self-correct with UE use on parallel bars  Step over small hurdle 1x10 each LE to challenge motor control  Step over tall hurdle 1x6 each LE to challenge motor control    TREATMENT         DATE:06/01/24 Therex: UBE LE ROM with UE/LE lvl 1 full revolutions, seat 10 6 mins  Incline gastroc stretch 30 sec x 3 bilateral  Seated LAQ with end range pauses each direction 2 x 15 Lt leg Leg Press 87# 3x10  unilateral 31# 2x10 TherActivity (to improve standing, walking, transfers, stairs) Forward step up 6 inch step 2x10 side step ups 2x10 and step up/overs  2x10 Neuro Re-ed Heel to toe balance  PATIENT EDUCATION:  Education details: HEP Person educated: Patient Education method: Programmer, Multimedia, Demonstration, Actor cues, and Verbal cues Education comprehension: verbalized understanding, returned demonstration, and verbal cues required  HOME EXERCISE PROGRAM: Access Code: [email or web address] URL: [email or web address] Date: 06/15/2024 Prepared by: Ozell Silvan  Exercises - Supine Heel Slide (Mirrored)  - 3-5 x daily - 7 x weekly - 1-2 sets - 10 reps - 2 hold - Sit to Stand  - 2-3 x daily - 7 x weekly - 1 sets - 10 reps - Supine 90/90 Sciatic Nerve Glide with Knee Flexion/Extension  - 1-2 x daily - 7 x weekly - 1-2 sets - 10 reps - Standing Heel Raise with Support  - 2 x daily - 7 x weekly - 2 sets - 10 reps - Seated SLR  - 1-2 x daily - 7 x weekly -  1-2 sets - 10-15 reps - 2 hold - Seated Long Arc Quad (Mirrored)  - 3-5 x daily - 7 x weekly - 1-2 sets - 10 reps - 2 hold - Seated Knee Flexion Extension AAROM with Overpressure (Mirrored)  - 2-3 x daily - 7 x weekly - 1 sets - 5 reps - 10 hold  ASSESSMENT:  CLINICAL IMPRESSION:  The patient has attended 15 visits over the  course of treatment cycle.  Patient has reported overall improvement at 90% .  See objective data above for updated information regarding current presentation.  At this time, Pt had demonstrated good progress, reaching goals as established and reported lower functional disability due to condition.  Flexion mobility main thing that shows as still having impairment, lacking about 10 degrees from typical goal for progressive mobility.  Pt was desiring plan for HEP transitioning.  Reviewed activity today to confirm knowledge.    OBJECTIVE IMPAIRMENTS: Abnormal gait, decreased balance, decreased ROM, decreased strength, increased edema, impaired flexibility, and pain.   ACTIVITY LIMITATIONS: bending, sitting, standing, squatting, sleeping, stairs, transfers, bed mobility, and toileting  PARTICIPATION LIMITATIONS: meal prep, cleaning, driving, shopping, and community activity  PERSONAL FACTORS: Age and Fitness are also affecting patient's functional outcome.   REHAB POTENTIAL: Good  CLINICAL DECISION MAKING: Stable/uncomplicated  EVALUATION COMPLEXITY: Moderate   GOALS: Goals reviewed with patient? Yes  SHORT TERM GOALS: Target date: 05/25/2024   Pt to be independent in HEP. Baseline: Goal status: MET 9/2  2.  Decrease pain by 1-2 levels. Baseline:  Goal status: MET 9/2  3.  Progress to ambulation with SPC  household distances. Baseline:  Goal status: MET 9/2  LONG TERM GOALS: Target date: 07/06/2024    Increase AROM to 0> 110 to allow for improved mobility with sit to stand transfers and ADLs.  Goal status: mostly met 06/15/2024  2.  Increase LLE strength to at least 4/5 throughout.    Goal status: Met for knee 06/15/2024  3.  Able to ambulate short community distances with [facility] (if needed) independently.  Goal status: Met 06/15/2024  4.  Decrease pain to max 3/10 with rest and ADLs.  Goal status: Met 06/15/2024  5.  Increase by 2+ levels in PSFS score.  Goal status: Met  06/15/2024   PLAN:  PT FREQUENCY: 2x/week  PT DURATION: 12 weeks  PLANNED INTERVENTIONS: 97164- PT Re-evaluation, 97110-Therapeutic exercises, 97530- Therapeutic activity, 97112- Neuromuscular re-education, 97535- Self Care, 02859- Manual therapy, [phone]- Gait training, [phone]- Electrical stimulation (unattended), 97016- Vasopneumatic device, Patient/Family education, Balance training, Stair training, Joint mobilization, Scar mobilization, Cryotherapy, and Moist heat  PLAN FOR NEXT SESSION:  Trial HEP with primary focus on flexion.  Discharge after 30 days inactivity.   Ozell Silvan, PT, DPT, OCS, ATC 06/15/24  2:26 PM     PHYSICAL THERAPY DISCHARGE SUMMARY  Visits from Start of Care: 15  Current functional level related to goals / functional outcomes: See note   Remaining deficits: See note   Education / Equipment: HEP  Patient goals were met. Patient is being discharged due to not returning since the last visit.  Ozell Silvan, PT, DPT, OCS, ATC 10/06/24  7:33 AM    "

## 2024-06-17 ENCOUNTER — Encounter: Admitting: Rehabilitative and Restorative Service Providers"

## 2024-06-23 ENCOUNTER — Encounter: Admitting: Orthopaedic Surgery

## 2024-06-25 ENCOUNTER — Ambulatory Visit (INDEPENDENT_AMBULATORY_CARE_PROVIDER_SITE_OTHER): Admitting: Physician Assistant

## 2024-06-25 ENCOUNTER — Encounter: Payer: Self-pay | Admitting: Physician Assistant

## 2024-06-25 DIAGNOSIS — Z96652 Presence of left artificial knee joint: Secondary | ICD-10-CM

## 2024-06-25 NOTE — Progress Notes (Signed)
 Post-Op Visit Note   Patient: Cristina Sawyer           Date of Birth: Aug 03, 1941           MRN: [id number] Visit Date: 06/25/2024 PCP: Joyce Norleen BROCKS, MD   Assessment & Plan:  Chief Complaint:  Chief Complaint  Patient presents with   Left Knee - Follow-up    Left TKA 03/29/2024   Visit Diagnoses:  1. Status post total left knee replacement     Plan: Patient is a pleasant 83 year old female who comes in today 3 months status post left total knee replacement 03/29/2024.  She has been doing much better.  She has recently finished physical therapy and continues to work on exercises at the gym.  She does note some discomfort at night for which she takes Aleve.  She also has noticed increased sensitivity to the scar especially when the sheets touch the area when she gets in the bed at night.  She has been using CeraVe a and vitamin E.  Examination of her left knee reveals range of motion from 0 to 100 degrees.  She is stable to valgus and varus stress.  She is neurovascularly intact distally.  At this point, she will continue with her home exercises.  I have refilled her tramadol  to take as needed.  Dental prophylaxis reinforced.  Follow-up in 3 months for repeat evaluation and 2 view x-rays of the left knee.  Call with concerns or questions.  Follow-Up Instructions: Return in about 3 months (around 09/25/2024).   Orders:  No orders of the defined types were placed in this encounter.  No orders of the defined types were placed in this encounter.   Imaging: No new imaging  PMFS History: Patient Active Problem List   Diagnosis Date Noted   Status post total left knee replacement 03/29/2024   Primary osteoarthritis of left knee 03/28/2024   Status post total right knee replacement 12/23/2022   History of GI bleed 06/06/2022   Osteopenia 08/30/2021   Aortic atherosclerosis 07/12/2021   Diverticulosis 07/12/2021   GAD (generalized anxiety disorder) 07/05/2021   Personal history  of calcium  pyrophosphate deposition disease (CPPD) 04/11/2020   History of vitamin D  deficiency 07/26/2019   Age-related cataract of both eyes 05/19/2018   Hyperlipidemia 05/19/2018   CKD (chronic kidney disease) stage 3, GFR 30-59 ml/min (HCC) 02/14/2018   Essential hypertension 05/12/2017   Transient cerebral ischemia 05/12/2017   Seasonal allergic rhinitis due to pollen 12/29/2014   GERD (gastroesophageal reflux disease) 01/17/2014   History of colonic polyps 08/09/2008   Past Medical History:  Diagnosis Date   Arthritis    Cataract    Chronic kidney disease    Diverticulosis    Dyslipidemia    GERD (gastroesophageal reflux disease)    HTN (hypertension) 2015   Personal history of colonic adenoma 08/09/2008   Vitamin D  deficiency     Family History  Problem Relation Age of Onset   Heart disease Sister    Heart disease Sister        in her 39s   Colon cancer Neg Hx     Past Surgical History:  Procedure Laterality Date   ABDOMINAL HYSTERECTOMY  1975   APPENDECTOMY  1975   COLONOSCOPY     HAND SURGERY     Cyst removal   THYROID  SURGERY  2000   to remove nodule   TOTAL KNEE ARTHROPLASTY Right 12/23/2022   Procedure: RIGHT TOTAL KNEE ARTHROPLASTY;  Surgeon:  Jerri Kay HERO, MD;  Location: [facility] OR;  Service: Orthopedics;  Laterality: Right;   TOTAL KNEE ARTHROPLASTY Left 03/29/2024   Procedure: LEFT TOTAL KNEE ARTHROPLASTY;  Surgeon: Jerri Kay HERO, MD;  Location: [facility] OR;  Service: Orthopedics;  Laterality: Left;   Social History   Occupational History   Occupation: Retired from [employer]  Tobacco Use   Smoking status: Never   Smokeless tobacco: Never  Vaping Use   Vaping status: Never Used  Substance and Sexual Activity   Alcohol  use: Not Currently    Alcohol /week: 1.0 standard drink of alcohol     Types: 1 Glasses of wine per week   Drug use: Yes    Types: Hydrocodone    Sexual activity: Yes

## 2024-07-15 ENCOUNTER — Other Ambulatory Visit: Payer: Self-pay | Admitting: Physician Assistant

## 2024-07-15 ENCOUNTER — Telehealth: Payer: Self-pay | Admitting: *Deleted

## 2024-07-15 MED ORDER — TRAMADOL HCL 50 MG PO TABS: 50.0000 mg | ORAL_TABLET | Freq: Every day | ORAL | 0 refills | Status: DC | PRN

## 2024-07-15 NOTE — Telephone Encounter (Signed)
 Patient aware

## 2024-07-15 NOTE — Telephone Encounter (Signed)
 sent

## 2024-07-15 NOTE — Telephone Encounter (Signed)
 Patient called and states at last visit you guys had discussed a refill of Tramadol . I don't think that was sent in. Could she get a refill sent to her pharmacy. Thank you.

## 2024-07-19 ENCOUNTER — Encounter: Payer: Self-pay | Admitting: Radiology

## 2024-07-23 ENCOUNTER — Other Ambulatory Visit: Payer: Self-pay

## 2024-07-23 NOTE — Patient Outreach (Signed)
 Aging Gracefully Program  07/23/2024  JUNETTA HEARN 1941-02-28 [id number]   [facility] Evaluation Interviewer attempted to call patient on today regarding Aging Gracefully referral. No answer from patient after multiple rings. CMA left confidential voicemail for patient to return call.  Will attempt to call back within 1 week.    Shereen [facility]  Population Health Care Management Assistant  Direct Dial: [phone]  Fax: [phone] Website: [email or web address]

## 2024-07-23 NOTE — Patient Outreach (Signed)
 Aging Gracefully Program  07/23/2024  Cristina Sawyer 1941-04-11 [id number]   [facility] Evaluation Interviewer made contact with patient. Aging Gracefully survey completed.   Interviewer will send referral to RN and OT for follow up.    Shereen [facility]  Population Health Care Management Assistant  Direct Dial: [phone]  Fax: [phone] Website: [email or web address]

## 2024-07-27 ENCOUNTER — Other Ambulatory Visit: Payer: Self-pay

## 2024-07-27 ENCOUNTER — Encounter (HOSPITAL_BASED_OUTPATIENT_CLINIC_OR_DEPARTMENT_OTHER): Payer: Self-pay | Admitting: Emergency Medicine

## 2024-07-27 ENCOUNTER — Emergency Department (HOSPITAL_BASED_OUTPATIENT_CLINIC_OR_DEPARTMENT_OTHER)
Admission: EM | Admit: 2024-07-27 | Discharge: 2024-07-27 | Disposition: A | Discharge status: Home / Self Care | Attending: Emergency Medicine | Admitting: Emergency Medicine

## 2024-07-27 DIAGNOSIS — Z79899 Other long term (current) drug therapy: Secondary | ICD-10-CM | POA: Diagnosis not present

## 2024-07-27 DIAGNOSIS — Z7982 Long term (current) use of aspirin: Secondary | ICD-10-CM | POA: Diagnosis not present

## 2024-07-27 DIAGNOSIS — I10 Essential (primary) hypertension: Secondary | ICD-10-CM | POA: Diagnosis not present

## 2024-07-27 DIAGNOSIS — R21 Rash and other nonspecific skin eruption: Secondary | ICD-10-CM | POA: Diagnosis not present

## 2024-07-27 LAB — BASIC METABOLIC PANEL WITH GFR
Anion gap: 12 (ref 5–15)
BUN: 12 mg/dL (ref 8–23)
CO2: 25 mmol/L (ref 22–32)
Calcium: 9.6 mg/dL (ref 8.9–10.3)
Chloride: 99 mmol/L (ref 98–111)
Creatinine, Ser: 1.05 mg/dL — ABNORMAL HIGH (ref 0.44–1.00)
GFR, Estimated: 52 mL/min — ABNORMAL LOW (ref >60)
Glucose, Bld: 124 mg/dL — ABNORMAL HIGH (ref 70–99)
Potassium: 3.5 mmol/L (ref 3.5–5.1)
Sodium: 136 mmol/L (ref 135–145)

## 2024-07-27 LAB — CBC
HCT: 39.4 % (ref 36.0–46.0)
Hemoglobin: 13.1 g/dL (ref 12.0–15.0)
MCH: 27.1 pg (ref 26.0–34.0)
MCHC: 33.2 g/dL (ref 30.0–36.0)
MCV: 81.6 fL (ref 80.0–100.0)
Platelets: 205 K/uL (ref 150–400)
RBC: 4.83 MIL/uL (ref 3.87–5.11)
RDW: 14.3 % (ref 11.5–15.5)
WBC: 6.5 K/uL (ref 4.0–10.5)
nRBC: 0 % (ref 0.0–0.2)

## 2024-07-27 MED ORDER — PREDNISONE 20 MG PO TABS: 40.0000 mg | ORAL_TABLET | Freq: Every day | ORAL | 0 refills | Status: AC

## 2024-07-27 MED ORDER — BACITRACIN ZINC 500 UNIT/GM EX OINT: 1.0000 | TOPICAL_OINTMENT | Freq: Two times a day (BID) | CUTANEOUS | 0 refills | Status: DC

## 2024-07-27 NOTE — Discharge Instructions (Addendum)
 Discontinue use of Caltrate and Aquaphor healing ointment, as these may be contributing to your rash.  Continue over-the-counter antihistamines, like Claritin  or Zyrtec, for itching.  Start bacitracin, this is a topical antibiotic, apply twice daily to the affected areas. Start Prednisone, take 40mg  daily for 5 days.  Follow-up with your PCP.  Return to the emergency department if your symptoms worsen.

## 2024-07-27 NOTE — ED Triage Notes (Signed)
 Pt reports rec circular bumps to BUE, BLE and torso starting 3 days pta. Pt endorses using aquaphor x 1 week pta. Starting taking caltrate 2 weeks pta. Denies shob. Pt speaking in complete sentences

## 2024-07-27 NOTE — ED Provider Notes (Signed)
 [place] EMERGENCY DEPARTMENT AT [facility] Provider Note   CSN: [id number] Arrival date & time: 07/27/24  1203     Patient presents with: Rash   Cristina Sawyer is a 83 y.o. female.   83 year old female presenting with a rash.  Patient notes that her symptoms began on Sunday, describes multiple tender/raised/itchy lesions to bilateral upper extremities, anterior neck and chest, one on the face below her nose, and a few on her thighs.  She reports feeling warm last night but denies checking her temperature or having a known/documented fever.  She started Caltrate approximately 2 weeks ago and started Aquaphor healing ointment approximately 1.5 weeks ago, she is unsure if 1 of these may be contributing to her symptoms.  History of allergy to codeine and ciprofloxacin , otherwise no additional known allergies.  She denies any swelling of her lips/tongue/throat, no abdominal pain, no nausea/vomiting/diarrhea, no recent illness.     Rash      Prior to Admission medications   Medication Sig Start Date End Date Taking? Authorizing Provider  amLODipine  (NORVASC ) 10 MG tablet Take 1 tablet (10 mg total) by mouth daily. 09/03/23   Joyce Norleen BROCKS, MD  aspirin  (ASPIRIN  81) 81 MG chewable tablet Chew 1 tablet (81 mg total) by mouth 2 (two) times daily. 03/18/24   Jule Ronal LITTIE, PA-C  cyanocobalamin (VITAMIN B12) 500 MCG tablet Take 500 mcg by mouth 3 (three) times a week.    [provider]  docusate sodium  (COLACE) 100 MG capsule Take 1 capsule (100 mg total) by mouth daily as needed. 03/18/24 03/18/25  Jule Ronal LITTIE, PA-C  HYDROcodone -acetaminophen  (NORCO/VICODIN) 5-325 MG tablet Take 1-2 tablets by mouth daily as needed for moderate pain (pain score 4-6). 05/12/24   Jerri Kay HERO, MD  methocarbamol  (ROBAXIN ) 500 MG tablet Take 1 tablet (500 mg total) by mouth 2 (two) times daily as needed. 05/12/24   Jerri Kay HERO, MD  Multiple Vitamin (MULTI-VITAMIN DAILY PO) Take 1  tablet by mouth 3 (three) times a week.    [provider]  ondansetron  (ZOFRAN ) 4 MG tablet Take 1 tablet (4 mg total) by mouth every 8 (eight) hours as needed for nausea or vomiting. Patient not taking: Reported on 04/26/2024 12/17/22   Jule Ronal LITTIE, PA-C  ondansetron  (ZOFRAN ) 4 MG tablet Take 1 tablet (4 mg total) by mouth every 8 (eight) hours as needed for nausea or vomiting. 03/18/24   Jule Ronal LITTIE, PA-C  pantoprazole  (PROTONIX ) 40 MG tablet Take 1 tablet (40 mg total) by mouth daily. Needs office visit. Patient taking differently: Take 40 mg by mouth daily as needed (acid reflux). 10/01/23   Lalonde, John C, MD  Polyethyl Glycol-Propyl Glycol (SYSTANE OP) Place 1 drop into both eyes 2 (two) times daily as needed (dry eyes).    [provider]  rosuvastatin  (CRESTOR ) 5 MG tablet Take 1 tablet (5 mg total) by mouth daily. 09/03/23   Joyce Norleen BROCKS, MD  traMADol  (ULTRAM ) 50 MG tablet Take 1-2 tablets (50-100 mg total) by mouth daily as needed. 07/15/24   Jule Ronal LITTIE, PA-C  VITAMIN D  PO Take 2,000 Units by mouth daily.    [provider]  omeprazole  (PRILOSEC OTC) 20 MG tablet Take 20 mg by mouth daily.    12/11/11  [provider]    Allergies: Codeine and Ciprofloxacin  hcl    Review of Systems  Skin:  Positive for rash.    Updated Vital Signs  Vitals:  07/27/24 1218 07/27/24 1219 07/27/24 1245  BP: (!) 155/86  (!) 123/56  Pulse: 98  87  Resp: 18  18  Temp: 99.4 F (37.4 C)    TempSrc: Oral    SpO2: 100%  94%  Weight:  83.9 kg      Physical Exam Vitals and nursing note reviewed.  HENT:     Head: Normocephalic.     Mouth/Throat:     Comments: No swelling of the lips/tongue/throat, apthous ulcer noted to the mucosal aspect of the upper lip on the R side, no other mucosal ulcers or lesions identified Eyes:     Extraocular Movements: Extraocular movements intact.  Cardiovascular:     Rate and Rhythm: Normal rate.  Pulmonary:      Effort: Pulmonary effort is normal.  Musculoskeletal:     Cervical back: Normal range of motion.     Comments: Moves all extremities spontaneously without difficulty  Skin:    General: Skin is warm and dry.     Comments: Multiple raised, tender, circular erythematous lesions ~1cm or less in size noted to the bilateral upper extremities, anterior neck/chest, bilateral thighs, and one lesion inferior to the nose  Neurological:     Mental Status: She is alert and oriented to person, place, and time.     (all labs ordered are listed, but only abnormal results are displayed) Labs Reviewed  BASIC METABOLIC PANEL WITH GFR - Abnormal; Notable for the following components:      Result Value   Glucose, Bld 124 (*)    Creatinine, Ser 1.05 (*)    GFR, Estimated 52 (*)    All other components within normal limits  CBC    EKG: None  Radiology: No results found.   Procedures   Medications Ordered in the ED - No data to display                                  Medical Decision Making This patient presents to the ED for concern of rash, this involves an extensive number of treatment options, and is a complaint that carries with it a high risk of complications and morbidity.  The differential diagnosis includes contact dermatitis, scabies vs bed bugs vs other bug bites, erythema nodosum, SJS/TEN, shingles   Co morbidities that complicate the patient evaluation  Hypertension, GERD   Additional history obtained:  Additional history obtained from record review External records from outside source obtained and reviewed including recent orthopedic note   Lab Tests:  I Ordered, and personally interpreted labs.  The pertinent results include: CBC within normal limits.  BMP with stable creatinine/GFR.    Cardiac Monitoring: / EKG:  The patient was maintained on a cardiac monitor.  I personally viewed and interpreted the cardiac monitored which showed an underlying rhythm of:  NSR   Problem List / ED Course / Critical interventions / Medication management I have reviewed the patients home medicines and have made adjustments as needed   Test / Admission - Considered:  Physical exam notable as above, lesions resemble bug bites however patient notes she has not been anywhere out of the ordinary for her, she changes her sheets frequently and has not had any evidence of bedbugs or other insects in her home.  There is an aphthous ulcer in her inner upper lip, although I do not feel that this is related as there are no other mucosal lesions to  raise concern.  (-) Nikolsky sign.  No oropharyngeal swelling to suggest anaphylactic reaction.  She is well-appearing and in no acute distress, vitals are reassuring, she is afebrile and without tachycardia.  Labs are largely unremarkable as above.  She has recently started a new multivitamin and Aquaphor healing ointment, I recommend that she discontinue both of these as it is unclear if 1 may be contributing to her symptoms today.  Will prescribe patient a 5-day course of prednisone as well as topical bacitracin, I recommend that she continue over-the-counter antihistamines like Zyrtec or Claritin  for itching.  Patient voiced understanding is in agreement with this plan, I recommend that she follow-up with her PCP as well, return precautions discussed.  She is appropriate for discharge at this time.  Staffed with Dr. Jerrol who independently evaluated this patient at the bedside  Amount and/or Complexity of Data Reviewed Labs: ordered.  Risk OTC drugs. Prescription drug management.        Final diagnoses:  Rash    ED Discharge Orders          Ordered    bacitracin ointment  2 times daily        07/27/24 1430    predniSONE (DELTASONE) 20 MG tablet  Daily        07/27/24 1430               Glendia Rocky SAILOR, PA-C 07/27/24 1440    Jerrol Agent, MD 07/27/24 1601

## 2024-07-29 ENCOUNTER — Encounter: Payer: Self-pay | Admitting: Family Medicine

## 2024-07-29 ENCOUNTER — Ambulatory Visit (INDEPENDENT_AMBULATORY_CARE_PROVIDER_SITE_OTHER): Admitting: Family Medicine

## 2024-07-29 VITALS — BP 126/76 | HR 84 | Wt 180.6 lb

## 2024-07-29 DIAGNOSIS — Z96651 Presence of right artificial knee joint: Secondary | ICD-10-CM | POA: Diagnosis not present

## 2024-07-29 DIAGNOSIS — R7303 Prediabetes: Secondary | ICD-10-CM | POA: Diagnosis not present

## 2024-07-29 DIAGNOSIS — L509 Urticaria, unspecified: Secondary | ICD-10-CM | POA: Diagnosis not present

## 2024-07-29 LAB — POCT GLYCOSYLATED HEMOGLOBIN (HGB A1C): Hemoglobin A1C: 5.8 % — AB (ref 4.0–5.6)

## 2024-07-29 NOTE — Progress Notes (Signed)
 Name: ARIEANNA PRESSEY   Date of Visit: 07/29/24   Date of last visit with me: Visit date not found   CHIEF COMPLAINT:  Chief Complaint  Patient presents with   Hospitalization Follow-up    Hospital follow up, bad rash, big red spots, still popping up. Wants to have labs reviewed that was done at [facility].        HPI:  Discussed the use of AI scribe software for clinical note transcription with the patient, who gave verbal consent to proceed.  History of Present Illness   Cristina Sawyer is an 83 year old female who presents with a rash and itching after using new dryer sheets.  She developed a severe rash and itching after using new Gain dryer sheets on her bed linens. The rash appeared on Monday morning after using the sheets on Sunday night. It was itchy, swollen, and painful, spreading to her legs and neck. She experienced relief when sleeping in a bed with linens not washed with the new sheets.  She visited the ER where she was given bacitracin cream, which she applied after showering. The rash subsided when she avoided the sheets washed with the new dryer sheets. The rash was described as hive-like, red, and raised.  She discussed her knee replacement, noting that while the first surgery was successful, she is still experiencing difficulties with the second knee replacement. She has completed physical therapy and mentioned she has a bike at home that she does not use as much as she should.         OBJECTIVE:       11 /03/2024   12:58 PM  Depression screen PHQ 2/9  Decreased Interest 0  Down, Depressed, Hopeless 0  PHQ - 2 Score 0     BP Readings from Last 3 Encounters:  07/29/24 126/76  07/27/24 (!) 117/94  03/30/24 (!) 143/68    BP 126/76   Pulse 84   Wt 180 lb 9.6 oz (81.9 kg)   BMI 31.99 kg/m    Physical Exam          Physical Exam Constitutional:      Appearance: Normal appearance.  Skin:    Coloration: Skin is not jaundiced.     Findings:  No bruising, erythema or rash.  Neurological:     General: No focal deficit present.     Mental Status: She is alert and oriented to person, place, and time. Mental status is at baseline.     ASSESSMENT/PLAN:   Assessment & Plan Hives  Prediabetes  Status post total right knee replacement    Assessment and Plan    Urticaria (hives) Acute urticaria likely due to allergic reaction to new dryer sheets. No metabolic cause identified from ER labs. Condition is common and not severe. - Discontinue use of new dryer sheets. - Use Benadryl for symptomatic relief. - Consider daily antihistamine like Claritin  if symptoms persist. - Previous ER note reviewed from 07/27/2024, labs from 07/27/2024 independently reviewed by me and discussed with patient.   Chronic kidney disease stage 3A Age-related changes. No concerning findings in recent labs. Kidneys expected to outlast her lifespan without need for replacement.  Prediabetes No recent A1c available. No significant issues with blood sugar control. Minimal treatment anticipated if diabetes is confirmed due to age-related considerations. - Performed pinprick test to check blood sugar levels. A1C 5.8 - Review results and determine further management based on findings.  Right knee arthroplasty, post-operative status Post-operative status following  right knee arthroplasty. Second knee replacement has not provided the same level of relief as the first. Stopped physical therapy but continues to walk and has access to a stationary bike. - Encouraged strengthening exercises for quadriceps. - Advised use of stationary bike for additional exercise.       I personally spent a total of 42 minutes in the care of the patient today including preparing to see the patient, getting/reviewing separately obtained history, performing a medically appropriate exam/evaluation, counseling and educating, placing orders, referring and communicating with other health  care professionals, documenting clinical information in the EHR, independently interpreting results, communicating results, and coordinating care.   Akasha Melena A. Vita MD [facility]

## 2024-08-13 ENCOUNTER — Other Ambulatory Visit: Payer: Self-pay | Admitting: Occupational Therapy

## 2024-08-14 NOTE — Patient Instructions (Signed)
 Goals Addressed               This Visit's Progress     Patient Stated        She would like to feel safer in the bathroom. Either a tub to shower conversion or a tub cut out with both having grab bars and a lower handheld shower holder. Also a grab bar beside each of her two toilets. A new shower seat if her current one still does not work once her shower is complete.      Patient Stated (pt-stated)        She would like to feel more safe in her home--transition ramp/thresholds where floor is uneven at doorways throughout her home.      Patient Stated (pt-stated)        She would like for it to be easier to get her compression stockings off and on. A modified compression sock donner and a dressing stick      Patient Stated (pt-stated)        She would like to be able to get into her bed safer. A step stool with rail would help with this.

## 2024-08-14 NOTE — Patient Outreach (Signed)
 Aging Gracefully Program  OT Initial Visit  08/14/2024  Cristina Sawyer 1941/02/15 [id number]  Visit:  1- Initial Visit  Start Time:  1600 End Time:  1700 Total Minutes:  60  CCAP: Typical Daily Routine: Typical Daily Routine:: Typically a later riser if no plans. Tends to bath at night and wash up in the mornings. Typically eats 2 meals a day. What Types Of Care Problems Are You Having Throughout The Day?: getting in and out of tub What Kind Of Help Do You Receive?: none What Do You Think Would Make Everyday Life Easier For You?: walk in shower with grab bars; grab bar beside each toilet What Is A Good Day Like?: when less stiffness and pain in most recent knee replacement What Is A Bad Day Like?: more pain and stiffness in most recent knee replacement Do You Have Time For Yourself?: yes Patient Reported Equipment: Patient Reported Equipment Currently Used: [employer]ant, Single Directv Other Equipment:: shower seat but does not use because will not fit in her tub Functional Mobility-Maintain Balance While Showering: Maintaining Balance While Showering: A Little Difficulty Do You:: No Device/No Assistance Importance Of Learning New Strategies:: Very Much Safety: Moderate/Extreme Risk Efficiency: Somewhat Intervention: Yes Other Comments:: grab bars in shower Functional Mobility-Move In And Out Of Bed: Move In and Out Of Bed: A Little Difficulty (into bed) Do You:: Use A Device (props on leg up on arm of recliner near  bed and uses it to push herself up onto the bed) Importance Of Learning New Strategies:: Very Much Observation: Move In and Out Of Bed: Independent With Pain, Difficulty, Or Use Of Device Safety: Moderate/Extreme Risk Efficiency: Somewhat Intervention: Yes Other Comments:: step stool with handle Functional Mobility-Move In And Out Of Bath/Shower: Move In And Out Of A Bath/Shower: Moderate Difficulty Do You:: Use A Device Importance Of Learning New  Strategies:: Very Much Safety: Moderate/Extreme Risk Efficiency: Somewhat Intervention: Yes Other Comments:: tub to shower conversion or tub cut out with grab bars and lower handheld shower holder Activities of Daily Living-Put On And Take Off Undergarments (Incl. Fasteners): Put On And Take Off Undergarments (Incl. Fasteners): A Lot of Difficulty Do You:: No Device/No Assistance Importance Of Learning New Strategies: Moderate ADL Observation: Put On And Take Off Undergarments (Incl. Fasteners): Independent With Pain, Difficulty, Or Use Of Device Safety: No Risk Efficiency: Not At All Intervention: Yes Other Comments:: device for compression hose   Readiness To Change Score:  Readiness to Change Score: 10  Home Environment Assessment: Outside Home Entry:: 2 steps up onto porch and one step into house, no railings Bathroom:: Full bath with bathtub and comfort height toilet; 1/2 bath with comfort height toilet Hallways:: hallway is raised so that you have ~1 inch step down into each room except bathroom Other Home Environment Concerns:: a couple of outlets won't hold plugs, deck out back needs footings and board looked into as well as railing secured, railing needs secured on side porch  Goals:  Goals Addressed               This Visit's Progress     Patient Stated        She would like to feel safer in the bathroom. Either a tub to shower conversion or a tub cut out with both having grab bars and a lower handheld shower holder. Also a grab bar beside each of her two toilets. A new shower seat if her current one still does not work once her  shower is complete.      Patient Stated (pt-stated)        She would like to feel more safe in her home--transition ramp/thresholds where floor is uneven at doorways throughout her home.      Patient Stated (pt-stated)        She would like for it to be easier to get her compression stockings off and on. A modified compression sock donner and  a dressing stick      Patient Stated (pt-stated)        She would like to be able to get into her bed safer. A step stool with rail would help with this.        Post Clinical Reasoning: Clinician View Of Client Situation:: It was a pleasure to meet [name] today. She geta around well for having had 2 knee replacements in the last couple of years. She sometimes uses a SPC when out and about but does not necessarily use any AD in the house. Client View Of His/Her Situation:: [name] reports that her latest knee still gets stiff when she sits too long and the weather is cold. She has trouble stepping in and out of her tub due to her latest knee replacement. Overall she is able to do what she needs to take care of herself and her home. Next Visit Plan:: Compression sock donning and doffing  Donny, OT Aging Gracefully [phone]

## 2024-08-25 ENCOUNTER — Encounter: Payer: Self-pay | Admitting: Family Medicine

## 2024-08-25 ENCOUNTER — Ambulatory Visit: Admitting: Family Medicine

## 2024-08-25 ENCOUNTER — Other Ambulatory Visit: Payer: Self-pay | Admitting: *Deleted

## 2024-08-25 VITALS — BP 132/70 | HR 65 | Ht 63.0 in | Wt 180.2 lb

## 2024-08-25 DIAGNOSIS — N1831 Chronic kidney disease, stage 3a: Secondary | ICD-10-CM | POA: Diagnosis not present

## 2024-08-25 DIAGNOSIS — J209 Acute bronchitis, unspecified: Secondary | ICD-10-CM

## 2024-08-25 MED ORDER — AMOXICILLIN 875 MG PO TABS: 875.0000 mg | ORAL_TABLET | Freq: Two times a day (BID) | ORAL | 0 refills | Status: DC

## 2024-08-25 NOTE — Progress Notes (Signed)
° °  Subjective:    Patient ID: Cristina Sawyer, female    DOB: [birth year], 83 y.o.   MRN: [id number]  Discussed the use of AI scribe software for clinical note transcription with the patient, who gave verbal consent to proceed.  History of Present Illness   Cristina Sawyer is an 83 year old female who presents with respiratory symptoms including cough, sore throat, and shortness of breath.  Her symptoms began a week ago last Sunday with a severe sore throat lasting a couple of days, followed by a cough, rhinorrhea, and production of clear sputum. She experiences a mild fever at night but no chills.  She feels nauseated due to postnasal drainage and occasionally experiences dyspnea, especially when ambulating from one room to another. Her sides were previously sore from coughing, but this has improved. No current otalgia, but she had some soreness previously.  Breathing is sometimes difficult, particularly with a mask on, and she feels more short of breath than usual since the onset of her symptoms.           Review of Systems     Objective:    Physical Exam Alert and in no distress. Tympanic membranes and canals are normal. Pharyngeal area is normal. Neck is supple without adenopathy or thyromegaly. Cardiac exam shows a regular sinus rhythm without murmurs or gallops. Lungs are clear to auscultation.                 Assessment & Plan:  Acute bronchitis, unspecified organism  Stage 3a chronic kidney disease (HCC)  Symptoms suggest viral etiology with sore throat, cough, rhinorrhea, and clear sputum.  However the length of time that she has had this makes me think more of a secondary bacterial infection.    I also cautioned her on the use of an NSAID regularly which she does not do.

## 2024-08-25 NOTE — Patient Outreach (Signed)
 Aging Gracefully Program  08/25/2024  Cristina Sawyer 1941-04-07 [id number]   Telephone call made to [name] to schedule Aging Gracefully RN home visit #1. [name] indicates she is currently under the weather and has a sick visit scheduled with MD today.  Agreed upon Aging Gracefully RN home visit for Dec. 30th at 12 noon. Writer provided contact information.   Pablo Hurst, MSN, RN, BSN [employer]  [facility], [facility] Commun[facility] RN Case Manager for Aging Gracefully Direct Dial: [phone]

## 2024-09-13 ENCOUNTER — Other Ambulatory Visit: Payer: Self-pay | Admitting: *Deleted

## 2024-09-13 NOTE — Patient Outreach (Signed)
 Aging Gracefully Program  09/13/2024  CAMARYN LUMBERT 04-21-1941 [id number]   Telephone call made to Mrs. Bar to confirm tomorrow's AG RN home visit. Mrs. Doto indicates she is still under the weather. Agreeable to rescheduling next home visit for January 13th at 12pm.    Pablo Hurst, MSN, RN, BSN   [facility], Healthy Communities RN Case Manager for Aging Gracefully Direct Dial: [phone]

## 2024-09-14 ENCOUNTER — Encounter: Payer: Self-pay | Admitting: *Deleted

## 2024-09-23 ENCOUNTER — Encounter: Payer: Self-pay | Admitting: Family Medicine

## 2024-09-23 ENCOUNTER — Ambulatory Visit (INDEPENDENT_AMBULATORY_CARE_PROVIDER_SITE_OTHER): Payer: Medicare HMO | Admitting: Family Medicine

## 2024-09-23 VITALS — BP 110/70 | HR 64 | Wt 182.4 lb

## 2024-09-23 DIAGNOSIS — Z8673 Personal history of transient ischemic attack (TIA), and cerebral infarction without residual deficits: Secondary | ICD-10-CM

## 2024-09-23 DIAGNOSIS — Z23 Encounter for immunization: Secondary | ICD-10-CM

## 2024-09-23 DIAGNOSIS — Z0000 Encounter for general adult medical examination without abnormal findings: Secondary | ICD-10-CM | POA: Diagnosis not present

## 2024-09-23 DIAGNOSIS — I10 Essential (primary) hypertension: Secondary | ICD-10-CM | POA: Diagnosis not present

## 2024-09-23 MED ORDER — ASPIRIN 81 MG PO CHEW: 81.0000 mg | CHEWABLE_TABLET | Freq: Once | ORAL | 0 refills | Status: AC

## 2024-09-23 MED ORDER — ROSUVASTATIN CALCIUM 5 MG PO TABS: 5.0000 mg | ORAL_TABLET | Freq: Every day | ORAL | 3 refills | Status: AC

## 2024-09-23 NOTE — Progress Notes (Signed)
" ° °  Name: Cristina Sawyer   Date of Visit: 09/23/2024   Date of last visit with me: 07/29/2024   CHIEF COMPLAINT:  Chief Complaint  Patient presents with   Annual Exam    Cpe.         HPI:  Discussed the use of AI scribe software for clinical note transcription with the patient, who gave verbal consent to proceed.  History of Present Illness   Cristina Sawyer is an 84 year old female who presents for a routine follow-up visit.  Cristina Sawyer recently recovered from a viral illness that lasted from Thanksgiving until about a month ago. During that time, Cristina Sawyer experienced symptoms that required medical attention, but Cristina Sawyer has since recovered.  Cristina Sawyer is scheduled to see her surgeon next week for a follow-up appointment regarding her knee surgery. Cristina Sawyer is aware that her blood pressure can rise post-surgery due to pain.  Cristina Sawyer received her COVID-19 vaccination today and plans to get her flu shot next week. Cristina Sawyer is also overdue for a TDAP booster and plans to get it at a pharmacy. Cristina Sawyer prefers to space out her vaccinations as Cristina Sawyer felt unwell when receiving them together previously.  Cristina Sawyer is taking her medications as needed and has started a new supplement, which Cristina Sawyer feels has been beneficial.         OBJECTIVE:       09/23/2024    1:27 PM  Depression screen PHQ 2/9  Decreased Interest 0  Down, Depressed, Hopeless 0  PHQ - 2 Score 0     BP Readings from Last 3 Encounters:  09/23/24 110/70  08/25/24 132/70  07/29/24 126/76    BP 110/70   Pulse 64   Wt 182 lb 6.4 oz (82.7 kg)   SpO2 100%   BMI 32.31 kg/m    Physical Exam          Physical Exam Constitutional:      Appearance: Normal appearance.  Neurological:     General: No focal deficit present.     Mental Status: Cristina Sawyer is alert and oriented to person, place, and time. Mental status is at baseline.     ASSESSMENT/PLAN:   Assessment & Plan Need for COVID-19 vaccine  Annual physical exam  History of TIA (transient ischemic  attack)  Essential hypertension    Assessment and Plan    Adult Wellness Visit Routine visit with no acute concerns. COVID-19 vaccination administered. Discussed flu shot efficacy and advised spacing flu shot and COVID-19 booster by one week. Discussed supplements, evidence inconclusive. - Ordered basic labs. - Advised flu shot next week. - Advised TDAP booster at [employer]. - Continue current health maintenance practices. - Comprehensive annual physical exam completed today. Reviewed interval history, current medical issues, medications, allergies, and preventive care needs. Addressed all patient questions and concerns. Discussed lifestyle factors including diet, exercise, sleep, and stress management. Reviewed recommended age-appropriate screenings, labs, and vaccinations. Counseling provided on healthy habits and routine health maintenance. Follow-up as indicated based on findings and results.   Essential hypertension Blood pressure well-controlled, no medication adjustment needed. - Continue current blood pressure management regimen.  - CMP and CBC pending     HLD -Refill crestor . Lipid panel pending     Kincade Granberg A. Vita MD [facility] "

## 2024-09-24 ENCOUNTER — Ambulatory Visit: Payer: Self-pay | Admitting: Family Medicine

## 2024-09-24 LAB — CBC WITH DIFFERENTIAL/PLATELET
Basophils Absolute: 0 x10E3/uL (ref 0.0–0.2)
Basos: 1 %
EOS (ABSOLUTE): 0.2 x10E3/uL (ref 0.0–0.4)
Eos: 2 %
Hematocrit: 43.8 % (ref 34.0–46.6)
Hemoglobin: 14.4 g/dL (ref 11.1–15.9)
Immature Grans (Abs): 0 x10E3/uL (ref 0.0–0.1)
Immature Granulocytes: 0 %
Lymphocytes Absolute: 1.2 x10E3/uL (ref 0.7–3.1)
Lymphs: 16 %
MCH: 27.7 pg (ref 26.6–33.0)
MCHC: 32.9 g/dL (ref 31.5–35.7)
MCV: 84 fL (ref 79–97)
Monocytes Absolute: 0.5 x10E3/uL (ref 0.1–0.9)
Monocytes: 7 %
Neutrophils Absolute: 5.5 x10E3/uL (ref 1.4–7.0)
Neutrophils: 74 %
Platelets: 263 x10E3/uL (ref 150–450)
RBC: 5.19 x10E6/uL (ref 3.77–5.28)
RDW: 14.5 % (ref 11.7–15.4)
WBC: 7.5 x10E3/uL (ref 3.4–10.8)

## 2024-09-24 LAB — COMPREHENSIVE METABOLIC PANEL WITH GFR
ALT: 14 IU/L (ref 0–32)
AST: 17 IU/L (ref 0–40)
Albumin: 4.8 g/dL — ABNORMAL HIGH (ref 3.7–4.7)
Alkaline Phosphatase: 111 IU/L (ref 48–129)
BUN/Creatinine Ratio: 15 (ref 12–28)
BUN: 15 mg/dL (ref 8–27)
Bilirubin Total: 0.7 mg/dL (ref 0.0–1.2)
CO2: 25 mmol/L (ref 20–29)
Calcium: 9.8 mg/dL (ref 8.7–10.3)
Chloride: 106 mmol/L (ref 96–106)
Creatinine, Ser: 1.02 mg/dL — ABNORMAL HIGH (ref 0.57–1.00)
Globulin, Total: 2.4 g/dL (ref 1.5–4.5)
Glucose: 89 mg/dL (ref 70–99)
Potassium: 4.5 mmol/L (ref 3.5–5.2)
Sodium: 147 mmol/L — ABNORMAL HIGH (ref 134–144)
Total Protein: 7.2 g/dL (ref 6.0–8.5)
eGFR: 55 mL/min/1.73 — ABNORMAL LOW

## 2024-09-24 LAB — LIPID PANEL
Chol/HDL Ratio: 1.9 ratio (ref 0.0–4.4)
Cholesterol, Total: 174 mg/dL (ref 100–199)
HDL: 92 mg/dL
LDL Chol Calc (NIH): 72 mg/dL (ref 0–99)
Triglycerides: 48 mg/dL (ref 0–149)
VLDL Cholesterol Cal: 10 mg/dL (ref 5–40)

## 2024-09-28 ENCOUNTER — Encounter: Payer: Self-pay | Admitting: *Deleted

## 2024-09-28 ENCOUNTER — Ambulatory Visit: Admitting: Physician Assistant

## 2024-09-28 ENCOUNTER — Other Ambulatory Visit: Payer: Self-pay

## 2024-09-28 DIAGNOSIS — Z96652 Presence of left artificial knee joint: Secondary | ICD-10-CM | POA: Diagnosis not present

## 2024-09-28 NOTE — Progress Notes (Signed)
 "  Post-Op Visit Note   Patient: Cristina Sawyer           Date of Birth: 1941/04/24           MRN: [id number] Visit Date: 09/28/2024 PCP: Vita Morrow, MD   Assessment & Plan:  Chief Complaint:  Chief Complaint  Patient presents with   Left Knee - Follow-up    Left TKA 03/29/2024   Visit Diagnoses:  1. Status post total left knee replacement     Plan: Patient is a pleasant 84 year old female who comes in today 6 months status post left total knee replacement.  She has been doing okay.  She does note a few days ago she stumbled and fell on the floor landing on her left knee.  She has had some discomfort to the knee and thigh but overall this has improved.  She has been taking tramadol  as needed.  Examination of the left knee reveals range of motion from 0 to 95 degrees.  No pain with logroll.  She is stable to valgus varus stress.  She is neurovascular intact distally.  At this point, she will continue to advance with activity as tolerated.  Follow-up in 6 months for repeat evaluation and 2 view x-rays of the left knee.  Call with concerns or questions.  Follow-Up Instructions: Return in about 6 months (around 03/28/2025).   Orders:  Orders Placed This Encounter  Procedures   XR Knee 1-2 Views Left   No orders of the defined types were placed in this encounter.   Imaging: XR Knee 1-2 Views Left Result Date: 09/28/2024 Well seated prosthesis without complication   PMFS History: Patient Active Problem List   Diagnosis Date Noted   Status post total left knee replacement 03/29/2024   Primary osteoarthritis of left knee 03/28/2024   Status post total right knee replacement 12/23/2022   History of GI bleed 06/06/2022   Osteopenia 08/30/2021   Aortic atherosclerosis 07/12/2021   Diverticulosis 07/12/2021   GAD (generalized anxiety disorder) 07/05/2021   Personal history of calcium  pyrophosphate deposition disease (CPPD) 04/11/2020   History of vitamin D  deficiency 07/26/2019    Age-related cataract of both eyes 05/19/2018   Hyperlipidemia 05/19/2018   CKD (chronic kidney disease) stage 3, GFR 30-59 ml/min (HCC) 02/14/2018   Essential hypertension 05/12/2017   Transient cerebral ischemia 05/12/2017   Seasonal allergic rhinitis due to pollen 12/29/2014   GERD (gastroesophageal reflux disease) 01/17/2014   History of colonic polyps 08/09/2008   Past Medical History:  Diagnosis Date   Arthritis    Cataract    Chronic kidney disease    Diverticulosis    Dyslipidemia    GERD (gastroesophageal reflux disease)    HTN (hypertension) 2015   Personal history of colonic adenoma 08/09/2008   Vitamin D  deficiency     Family History  Problem Relation Age of Onset   Heart disease Sister    Heart disease Sister        in her 19s   Colon cancer Neg Hx     Past Surgical History:  Procedure Laterality Date   ABDOMINAL HYSTERECTOMY  1975   APPENDECTOMY  1975   COLONOSCOPY     HAND SURGERY     Cyst removal   THYROID  SURGERY  2000   to remove nodule   TOTAL KNEE ARTHROPLASTY Right 12/23/2022   Procedure: RIGHT TOTAL KNEE ARTHROPLASTY;  Surgeon: Jerri Kay HERO, MD;  Location: [facility] OR;  Service: Orthopedics;  Laterality: Right;  TOTAL KNEE ARTHROPLASTY Left 03/29/2024   Procedure: LEFT TOTAL KNEE ARTHROPLASTY;  Surgeon: Jerri Kay HERO, MD;  Location: [facility] OR;  Service: Orthopedics;  Laterality: Left;   Social History   Occupational History   Occupation: Retired from [employer]  Tobacco Use   Smoking status: Never   Smokeless tobacco: Never  Vaping Use   Vaping status: Never Used  Substance and Sexual Activity   Alcohol  use: Not Currently    Alcohol /week: 1.0 standard drink of alcohol     Types: 1 Glasses of wine per week   Drug use: Yes    Types: Hydrocodone    Sexual activity: Yes     "

## 2024-09-30 ENCOUNTER — Encounter: Payer: Self-pay | Admitting: *Deleted

## 2024-09-30 ENCOUNTER — Other Ambulatory Visit: Payer: Self-pay | Admitting: *Deleted

## 2024-09-30 NOTE — Patient Instructions (Signed)
 Visit Information  Thank you for taking time to visit with me today. Please don't hesitate to contact me if I can be of assistance to you before our next scheduled home appointment.  Following are the goals we discussed today:   Goals Addressed               This Visit's Progress     AG RN (pt-stated)        09/30/24  Assessment: Ms. Galambos reports chronic knee pain and arthritic hand pain 5 out of 10. Reports recently tripping on area of the floor while she was vacuuming. Denies injury. Reports knee is intact per recent knee xray. Reports having knee surgery on both knees in the past. Had recent appointment with ortho. Ms. Messenger reports not exercising as much as she would like to. States she does not sleep well at night. Therefore, she wakes up around 1030 to 1100 am. States she wants to change this. States she plans to go to [employer] with friends on Thursdays. Ms.Stmarie reports carrying her cell phone on her at all times even in the home. Has cane in the home and in the car when needed.   Interventions: Discussed sleep hygiene strategies, such as meditation before bed, reading relaxing books, avoid fluids 1-2 hours before bed, avoid caffeine 6 hours before bed, avoid large meals before bed, encouraged calming teas without caffeine before bed, and to turn volume low on her phone before bed. Also encouraged Ms. Dadamo to limit news intake before bed.  Encouraged Ms. Somerville to speak with doctor about potential sleep aid. Encouraged Ms. Riva to set alert/reminder to exercise in home. Encouraged Ms. Gaudin to use exercise bike. Discussed fall precautions. Provided chronic conditions booklet along with contact information.  Plan: Scheduled next home visit for February 18th at 12 noon.  CLIENT/RN ACTION PLAN - GENERIC - (smartphrase AGRNGENERIC)  Registered Nurse:  Pablo Hurst Date: 09/30/24  Client Name: Foy Delphia Client ID:    Target Area:  GENERIC   Why Problem May  Occur: Disturbed sleep pattern resulting in late morning wake times and decreased motivation.    Target Goal:  Exercise twice a week over the next 120 days.     STRATEGIES Coping Strategies: Ideas  Use exercise bike Use health sleep strategies consistently  Attend senior center friends Begin waking up earlier in the mornings   Practice Aging Gracefully home exercises         Prevention Ideas                  PRACTICE It is important to practice the strategies so we can determine if they will be effective in helping to reach the goal.    Follow these specific recommendations:        If strategy does not work the first time, try it again.     We may make some changes over the next few sessions.       Barth Hurst, MSN, RN, BSN [place]  [facility], Healthy Communities RN Case Manager for Aging Gracefully Direct Dial: [phone]                                        Our next appointment is on February 17th at 12 noon.  If you are experiencing a Mental Health or Behavioral Health Crisis or need someone to talk to, please  call the Suicide and Crisis Lifeline: 988 call the [place]  National Suicide Prevention Lifeline: 551-219-3931 or TTY: (760) 214-1581 TTY 4190351346) to talk to a trained counselor call 1-800-273-TALK (toll free, 24 hour hotline) go to [facility] [address] [place] [phone]) call 911   The patient verbalized understanding of instructions, educational materials, and care plan provided today and agreed to receive a mailed copy of patient instructions, educational materials, and care plan.   Pablo Hurst, MSN, RN, BSN [place]  [facility], Healthy Communities RN Case Manager for Aging Gracefully Direct Dial: [phone]

## 2024-09-30 NOTE — Patient Outreach (Signed)
 Aging Gracefully Program  RN Visit  09/30/2024  Cristina Sawyer Feb 06, 1941 [id number]  Visit:  RN Visit Number: 1- Initial Visit  RN TIME CALCULATION: Start TIme:  RN Start Time Calculation: 1200 End Time:  RN Stop Time Calculation: 1330 Total Minutes:  RN Time Calculation: 90  Readiness To Change Score:  Readiness to Change Score: 10  Universal RN Interventions: Calendar Distribution: Yes Exercise Review: No Medications: Yes Medication Changes: No Mood: Yes Pain: Yes PCP Advocacy/Support: No Fall Prevention: Yes Incontinence: Yes Clinician View Of Client Situation: Arrived for home visit. Cristina Sawyer answered door with warm smile and demeanor. Home immaculate and freshly scented. Cristina Sawyer ambulates independently without assistive device. Client View Of His/Her Situation: Cristina Sawyer reports pain 5 out of 10. Reports having decreased sleep regularly that causes her to wake up late in the morning.  Healthcare Provider Communication: Did Surveyor, [employer] Provider?: No Healthcare Provider Response According to RN: n/a According to Client, Did PCP Report Communication With An Aging Gracefully RN?: No Healthcare Provider Response According To Client: n/a  Clinician View of Client Situation: Clinician View Of Client Situation: Arrived for home visit. Cristina Sawyer answered door with warm smile and demeanor. Home immaculate and freshly scented. Cristina Sawyer ambulates independently without assistive device. Client's View of His/Her Situation: Client View Of His/Her Situation: Cristina Sawyer reports pain 5 out of 10. Reports having decreased sleep regularly that causes her to wake up late in the morning.  Medication Assessment: Do You Have Any Problems Paying For Medications?: No Where Does Client Store Medications?: Other: (in bedroom) Can Client Read Pill Bottles?: Yes Does Client Use A Pillbox?: No Does Anyone Assist Client In Taking Medications?:  No Do You Take Vitamin D ?: Yes Does Client Have Any Questions Or Concerns About Medictions?: No Is Client Complaining Of Any Symptoms That Could Be Side Effects To Medications?: No Any Possible Changes In Medication Regimen?: No  OT Update: ongoing  Session Summary: Cristina Sawyer was very delightful and engaging during the home visit.    Goals Addressed               This Visit's Progress     AG RN (pt-stated)        09/30/24  Assessment: Cristina Sawyer reports chronic knee pain and arthritic hand pain 5 out of 10. Reports recently tripping on area of the floor while she was vacuuming. Denies injury. Reports knee is intact per recent knee xray. Reports having knee surgery on both knees in the past. Had recent appointment with ortho. Cristina Sawyer reports not exercising as much as she would like to. States she does not sleep well at night. Therefore, she wakes up around 1030 to 1100 am. States she wants to change this. States she plans to go to [employer] with friends on Thursdays. Cristina Sawyer reports carrying her cell phone on her at all times even in the home. Has cane in the home and in the car when needed.   Interventions: Discussed sleep hygiene strategies, such as meditation before bed, reading relaxing books, avoid fluids 1-2 hours before bed, avoid caffeine 6 hours before bed, avoid large meals before bed, encouraged calming teas without caffeine before bed, and to turn volume low on her phone before bed. Also encouraged Cristina Sawyer to limit news intake before bed.  Encouraged Cristina Sawyer to speak with doctor about potential sleep aid. Encouraged Cristina Sawyer to set alert/reminder to exercise in home. Encouraged Cristina Sawyer to use exercise bike. Discussed  fall precautions. Provided chronic conditions booklet along with contact information.  Plan: Scheduled next home visit for February 18th at 12 noon.  CLIENT/RN ACTION PLAN - GENERIC - (smartphrase AGRNGENERIC)  Registered  Nurse:  Pablo Hurst Date: 09/30/24  Client Name: Cristina Sawyer Client ID:    Target Area:  GENERIC   Why Problem May Occur: Disturbed sleep pattern resulting in late morning wake times and decreased motivation.    Target Goal:  Exercise twice a week over the next 120 days.     STRATEGIES Coping Strategies: Ideas  Use exercise bike Use health sleep strategies consistently  Attend senior center friends Begin waking up earlier in the mornings   Practice Aging Gracefully home exercises         Prevention Ideas                  PRACTICE It is important to practice the strategies so we can determine if they will be effective in helping to reach the goal.    Follow these specific recommendations:        If strategy does not work the first time, try it again.     We may make some changes over the next few sessions.       Barth Hurst, MSN, RN, BSN [place]  [facility], Healthy Communities RN Case Manager for Aging Gracefully Direct Dial: [phone]                                      Pablo Hurst, MSN, RN, BSN [place]  [facility], Healthy Communities RN Case Manager for Aging Gracefully Direct Dial: [phone]

## 2024-10-06 ENCOUNTER — Telehealth: Payer: Self-pay | Admitting: *Deleted

## 2024-10-06 ENCOUNTER — Other Ambulatory Visit: Payer: Self-pay | Admitting: Physician Assistant

## 2024-10-06 MED ORDER — METHOCARBAMOL 500 MG PO TABS: 500.0000 mg | ORAL_TABLET | Freq: Two times a day (BID) | ORAL | 2 refills | Status: AC | PRN

## 2024-10-06 MED ORDER — TRAMADOL HCL 50 MG PO TABS: 50.0000 mg | ORAL_TABLET | Freq: Every day | ORAL | 1 refills | Status: AC | PRN

## 2024-10-06 NOTE — Telephone Encounter (Signed)
 Patient aware

## 2024-10-06 NOTE — Telephone Encounter (Signed)
 Patient called and left VM requesting refill of Tramadol  if possible. She thought you guys discussed when she came last week, and you were going to send in. I told her I'd ask. She said if not the Tramadol , then the muscle relaxer. Thank you.

## 2024-10-06 NOTE — Telephone Encounter (Signed)
 Sent both to pharmacy on file

## 2024-11-02 ENCOUNTER — Encounter: Admitting: *Deleted

## 2025-03-29 ENCOUNTER — Ambulatory Visit: Admitting: Physician Assistant

## 2025-05-03 ENCOUNTER — Ambulatory Visit: Payer: Self-pay

## 2025-09-27 ENCOUNTER — Encounter: Admitting: Family Medicine
